# Patient Record
Sex: Male | Born: 1950
Health system: Southern US, Community
[De-identification: ages and names within clinical notes are randomized; demographics above are authoritative.]

## PROBLEM LIST (undated history)

## (undated) DIAGNOSIS — D2360 Other benign neoplasm of skin of unspecified upper limb, including shoulder: Secondary | ICD-10-CM

## (undated) DIAGNOSIS — D126 Benign neoplasm of colon, unspecified: Secondary | ICD-10-CM

## (undated) DIAGNOSIS — R5383 Other fatigue: Secondary | ICD-10-CM

## (undated) DIAGNOSIS — IMO0002 Reserved for concepts with insufficient information to code with codable children: Secondary | ICD-10-CM

## (undated) DIAGNOSIS — C44309 Unspecified malignant neoplasm of skin of other parts of face: Secondary | ICD-10-CM

## (undated) DIAGNOSIS — Z8719 Personal history of other diseases of the digestive system: Secondary | ICD-10-CM

## (undated) DIAGNOSIS — M4802 Spinal stenosis, cervical region: Secondary | ICD-10-CM

## (undated) DIAGNOSIS — N529 Male erectile dysfunction, unspecified: Secondary | ICD-10-CM

## (undated) DIAGNOSIS — Z125 Encounter for screening for malignant neoplasm of prostate: Secondary | ICD-10-CM

## (undated) DIAGNOSIS — B029 Zoster without complications: Secondary | ICD-10-CM

## (undated) DIAGNOSIS — E785 Hyperlipidemia, unspecified: Secondary | ICD-10-CM

## (undated) DIAGNOSIS — L821 Other seborrheic keratosis: Secondary | ICD-10-CM

## (undated) DIAGNOSIS — R079 Chest pain, unspecified: Secondary | ICD-10-CM

## (undated) DIAGNOSIS — M1710 Unilateral primary osteoarthritis, unspecified knee: Secondary | ICD-10-CM

## (undated) DIAGNOSIS — H269 Unspecified cataract: Secondary | ICD-10-CM

## (undated) DIAGNOSIS — M545 Low back pain, unspecified: Secondary | ICD-10-CM

## (undated) DIAGNOSIS — I10 Essential (primary) hypertension: Secondary | ICD-10-CM

## (undated) DIAGNOSIS — K219 Gastro-esophageal reflux disease without esophagitis: Secondary | ICD-10-CM

## (undated) DIAGNOSIS — N401 Enlarged prostate with lower urinary tract symptoms: Secondary | ICD-10-CM

## (undated) DIAGNOSIS — H9319 Tinnitus, unspecified ear: Secondary | ICD-10-CM

## (undated) DIAGNOSIS — G5600 Carpal tunnel syndrome, unspecified upper limb: Secondary | ICD-10-CM

## (undated) DIAGNOSIS — I5032 Chronic diastolic (congestive) heart failure: Secondary | ICD-10-CM

## (undated) DIAGNOSIS — K449 Diaphragmatic hernia without obstruction or gangrene: Secondary | ICD-10-CM

## (undated) DIAGNOSIS — N138 Other obstructive and reflux uropathy: Secondary | ICD-10-CM

## (undated) DIAGNOSIS — E669 Obesity, unspecified: Secondary | ICD-10-CM

## (undated) DIAGNOSIS — H409 Unspecified glaucoma: Secondary | ICD-10-CM

## (undated) DIAGNOSIS — Z85828 Personal history of other malignant neoplasm of skin: Secondary | ICD-10-CM

## (undated) DIAGNOSIS — R972 Elevated prostate specific antigen [PSA]: Secondary | ICD-10-CM

## (undated) DIAGNOSIS — R5381 Other malaise: Secondary | ICD-10-CM

## (undated) HISTORY — DX: Benign neoplasm of colon, unspecified: D12.6

## (undated) HISTORY — PX: EYE SURGERY: SHX253

## (undated) HISTORY — DX: Reserved for concepts with insufficient information to code with codable children: IMO0002

## (undated) HISTORY — PX: JOINT REPLACEMENT: SHX530

## (undated) HISTORY — DX: Chronic diastolic (congestive) heart failure: I50.32

## (undated) HISTORY — PX: TOTAL KNEE ARTHROPLASTY: SHX125

## (undated) HISTORY — DX: Benign prostatic hyperplasia with lower urinary tract symptoms: N40.1

## (undated) HISTORY — DX: Other seborrheic keratosis: L82.1

## (undated) HISTORY — DX: Essential (primary) hypertension: I10

## (undated) HISTORY — DX: Zoster without complications: B02.9

## (undated) HISTORY — DX: Unilateral primary osteoarthritis, unspecified knee: M17.10

## (undated) HISTORY — DX: Other obstructive and reflux uropathy: N13.8

## (undated) HISTORY — DX: Encounter for screening for malignant neoplasm of prostate: Z12.5

## (undated) HISTORY — DX: Unspecified cataract: H26.9

## (undated) HISTORY — DX: Male erectile dysfunction, unspecified: N52.9

## (undated) HISTORY — DX: Hyperlipidemia, unspecified: E78.5

## (undated) HISTORY — DX: Other fatigue: R53.83

## (undated) HISTORY — DX: Unspecified malignant neoplasm of skin of other parts of face: C44.309

## (undated) HISTORY — DX: Low back pain, unspecified: M54.50

## (undated) HISTORY — PX: CHOLECYSTECTOMY: SHX55

## (undated) HISTORY — DX: Obesity, unspecified: E66.9

## (undated) HISTORY — DX: Unspecified glaucoma: H40.9

## (undated) HISTORY — DX: Carpal tunnel syndrome, unspecified upper limb: G56.00

## (undated) HISTORY — DX: Elevated prostate specific antigen (PSA): R97.20

## (undated) HISTORY — DX: Other benign neoplasm of skin of unspecified upper limb, including shoulder: D23.60

## (undated) HISTORY — DX: Chest pain, unspecified: R07.9

## (undated) HISTORY — DX: Personal history of other malignant neoplasm of skin: Z85.828

## (undated) HISTORY — DX: Low back pain: M54.5

## (undated) HISTORY — DX: Tinnitus, unspecified ear: H93.19

## (undated) HISTORY — DX: Diaphragmatic hernia without obstruction or gangrene: K44.9

## (undated) HISTORY — DX: Gastro-esophageal reflux disease without esophagitis: K21.9

## (undated) HISTORY — DX: Other malaise: R53.81

---

## 1982-08-22 HISTORY — PX: INGUINAL HERNIA REPAIR: SUR1180

## 1990-08-22 HISTORY — PX: CHOLECYSTECTOMY: SHX55

## 1999-08-23 HISTORY — PX: RETINAL DETACHMENT SURGERY: SHX105

## 2002-08-22 DIAGNOSIS — J189 Pneumonia, unspecified organism: Secondary | ICD-10-CM

## 2002-08-22 HISTORY — DX: Pneumonia, unspecified organism: J18.9

## 2004-08-22 HISTORY — PX: OTHER SURGICAL HISTORY: SHX169

## 2006-06-02 ENCOUNTER — Ambulatory Visit (HOSPITAL_COMMUNITY): Admission: RE | Admit: 2006-06-02 | Discharge: 2006-06-02 | Payer: Self-pay | Admitting: Specialist

## 2006-08-22 HISTORY — PX: INTRAOCULAR LENS REMOVAL: SHX5339

## 2008-09-04 ENCOUNTER — Emergency Department (HOSPITAL_COMMUNITY): Admission: EM | Admit: 2008-09-04 | Discharge: 2008-09-04 | Payer: Self-pay | Admitting: Family Medicine

## 2008-09-16 ENCOUNTER — Emergency Department (HOSPITAL_COMMUNITY): Admission: EM | Admit: 2008-09-16 | Discharge: 2008-09-16 | Payer: Self-pay | Admitting: Emergency Medicine

## 2008-10-03 ENCOUNTER — Encounter: Admission: RE | Admit: 2008-10-03 | Discharge: 2008-10-03 | Payer: Self-pay | Admitting: Internal Medicine

## 2009-08-05 ENCOUNTER — Encounter: Admission: RE | Admit: 2009-08-05 | Discharge: 2009-08-05 | Payer: Self-pay | Admitting: Internal Medicine

## 2009-10-05 HISTORY — PX: TRANSTHORACIC ECHOCARDIOGRAM: SHX275

## 2009-11-20 ENCOUNTER — Ambulatory Visit (HOSPITAL_COMMUNITY): Admission: RE | Admit: 2009-11-20 | Discharge: 2009-11-20 | Payer: Self-pay | Admitting: General Surgery

## 2010-05-03 ENCOUNTER — Ambulatory Visit (HOSPITAL_BASED_OUTPATIENT_CLINIC_OR_DEPARTMENT_OTHER): Admission: RE | Admit: 2010-05-03 | Discharge: 2010-05-03 | Payer: Self-pay | Admitting: Specialist

## 2010-06-24 ENCOUNTER — Inpatient Hospital Stay (HOSPITAL_COMMUNITY)
Admission: RE | Admit: 2010-06-24 | Discharge: 2010-06-27 | Payer: Self-pay | Source: Home / Self Care | Admitting: Specialist

## 2010-08-22 HISTORY — PX: TOTAL HIP ARTHROPLASTY: SHX124

## 2010-08-25 ENCOUNTER — Other Ambulatory Visit: Payer: Self-pay | Admitting: Specialist

## 2010-08-25 ENCOUNTER — Ambulatory Visit
Admission: RE | Admit: 2010-08-25 | Discharge: 2010-08-25 | Payer: Self-pay | Source: Home / Self Care | Attending: Specialist | Admitting: Specialist

## 2010-08-27 LAB — URINALYSIS, ROUTINE W REFLEX MICROSCOPIC
Bilirubin Urine: NEGATIVE
Hemoglobin, Urine: NEGATIVE
Ketones, ur: NEGATIVE mg/dL
Nitrite: NEGATIVE
Protein, ur: NEGATIVE mg/dL
Specific Gravity, Urine: 1.014 (ref 1.005–1.030)
Urine Glucose, Fasting: NEGATIVE mg/dL
Urobilinogen, UA: 1 mg/dL (ref 0.0–1.0)
pH: 7 (ref 5.0–8.0)

## 2010-08-27 LAB — COMPREHENSIVE METABOLIC PANEL
ALT: <8 U/L (ref 0–53)
AST: 18 U/L (ref 0–37)
Albumin: 4.1 g/dL (ref 3.5–5.2)
Alkaline Phosphatase: 102 U/L (ref 39–117)
BUN: 10 mg/dL (ref 6–23)
CO2: 27 mEq/L (ref 19–32)
Calcium: 9.5 mg/dL (ref 8.4–10.5)
Chloride: 101 mEq/L (ref 96–112)
Creatinine, Ser: 0.84 mg/dL (ref 0.4–1.5)
GFR calc Af Amer: >60 mL/min (ref >60)
GFR calc non Af Amer: >60 mL/min (ref >60)
Glucose, Bld: 99 mg/dL (ref 70–99)
Potassium: 3.7 mEq/L (ref 3.5–5.1)
Sodium: 137 mEq/L (ref 135–145)
Total Bilirubin: 0.7 mg/dL (ref 0.3–1.2)
Total Protein: 6.5 g/dL (ref 6.0–8.3)

## 2010-08-27 LAB — CBC
HCT: 42.6 % (ref 39.0–52.0)
Hemoglobin: 14.4 g/dL (ref 13.0–17.0)
MCH: 29.8 pg (ref 26.0–34.0)
MCHC: 33.8 g/dL (ref 30.0–36.0)
MCV: 88.2 fL (ref 78.0–100.0)
Platelets: 242 10*3/uL (ref 150–400)
RBC: 4.83 MIL/uL (ref 4.22–5.81)
RDW: 12.8 % (ref 11.5–15.5)
WBC: 10.7 10*3/uL — ABNORMAL HIGH (ref 4.0–10.5)

## 2010-08-27 LAB — DIFFERENTIAL
Basophils Absolute: 0.1 10*3/uL (ref 0.0–0.1)
Basophils Relative: 1 % (ref 0–1)
Eosinophils Absolute: 0.4 10*3/uL (ref 0.0–0.7)
Eosinophils Relative: 4 % (ref 0–5)
Lymphocytes Relative: 23 % (ref 12–46)
Lymphs Abs: 2.5 10*3/uL (ref 0.7–4.0)
Monocytes Absolute: 1 10*3/uL (ref 0.1–1.0)
Monocytes Relative: 9 % (ref 3–12)
Neutro Abs: 6.8 10*3/uL (ref 1.7–7.7)
Neutrophils Relative %: 63 % (ref 43–77)

## 2010-08-27 LAB — PROTIME-INR
INR: 0.96 (ref 0.00–1.49)
Prothrombin Time: 13 seconds (ref 11.6–15.2)

## 2010-08-27 LAB — APTT: aPTT: 31 seconds (ref 24–37)

## 2010-09-02 ENCOUNTER — Inpatient Hospital Stay (HOSPITAL_COMMUNITY)
Admission: RE | Admit: 2010-09-02 | Discharge: 2010-09-05 | Payer: Self-pay | Source: Home / Self Care | Attending: Specialist | Admitting: Specialist

## 2010-09-06 LAB — CBC
HCT: 31.7 % — ABNORMAL LOW (ref 39.0–52.0)
HCT: 30 % — ABNORMAL LOW (ref 39.0–52.0)
HCT: 30.7 % — ABNORMAL LOW (ref 39.0–52.0)
Hemoglobin: 10.5 g/dL — ABNORMAL LOW (ref 13.0–17.0)
Hemoglobin: 10.1 g/dL — ABNORMAL LOW (ref 13.0–17.0)
Hemoglobin: 10.3 g/dL — ABNORMAL LOW (ref 13.0–17.0)
MCH: 29.6 pg (ref 26.0–34.0)
MCH: 29.5 pg (ref 26.0–34.0)
MCH: 29.4 pg (ref 26.0–34.0)
MCHC: 33.1 g/dL (ref 30.0–36.0)
MCHC: 33.7 g/dL (ref 30.0–36.0)
MCHC: 33.6 g/dL (ref 30.0–36.0)
MCV: 89.3 fL (ref 78.0–100.0)
MCV: 87.7 fL (ref 78.0–100.0)
MCV: 87.7 fL (ref 78.0–100.0)
Platelets: 190 10*3/uL (ref 150–400)
Platelets: 157 10*3/uL (ref 150–400)
Platelets: 175 10*3/uL (ref 150–400)
RBC: 3.55 MIL/uL — ABNORMAL LOW (ref 4.22–5.81)
RBC: 3.42 MIL/uL — ABNORMAL LOW (ref 4.22–5.81)
RBC: 3.5 MIL/uL — ABNORMAL LOW (ref 4.22–5.81)
RDW: 13 % (ref 11.5–15.5)
RDW: 13 % (ref 11.5–15.5)
RDW: 13.1 % (ref 11.5–15.5)
WBC: 12.3 10*3/uL — ABNORMAL HIGH (ref 4.0–10.5)
WBC: 11.4 10*3/uL — ABNORMAL HIGH (ref 4.0–10.5)
WBC: 10.7 10*3/uL — ABNORMAL HIGH (ref 4.0–10.5)

## 2010-09-06 LAB — BASIC METABOLIC PANEL
BUN: 9 mg/dL (ref 6–23)
BUN: 6 mg/dL (ref 6–23)
BUN: 4 mg/dL — ABNORMAL LOW (ref 6–23)
CO2: 29 mEq/L (ref 19–32)
CO2: 27 mEq/L (ref 19–32)
CO2: 28 mEq/L (ref 19–32)
Calcium: 8.4 mg/dL (ref 8.4–10.5)
Calcium: 8.5 mg/dL (ref 8.4–10.5)
Calcium: 8.7 mg/dL (ref 8.4–10.5)
Chloride: 102 mEq/L (ref 96–112)
Chloride: 105 mEq/L (ref 96–112)
Chloride: 103 mEq/L (ref 96–112)
Creatinine, Ser: 0.81 mg/dL (ref 0.4–1.5)
Creatinine, Ser: 0.74 mg/dL (ref 0.4–1.5)
Creatinine, Ser: 0.73 mg/dL (ref 0.4–1.5)
GFR calc Af Amer: >60 mL/min (ref >60)
GFR calc Af Amer: >60 mL/min (ref >60)
GFR calc Af Amer: >60 mL/min (ref >60)
GFR calc non Af Amer: >60 mL/min (ref >60)
GFR calc non Af Amer: >60 mL/min (ref >60)
GFR calc non Af Amer: >60 mL/min (ref >60)
Glucose, Bld: 116 mg/dL — ABNORMAL HIGH (ref 70–99)
Glucose, Bld: 114 mg/dL — ABNORMAL HIGH (ref 70–99)
Glucose, Bld: 117 mg/dL — ABNORMAL HIGH (ref 70–99)
Potassium: 4.3 mEq/L (ref 3.5–5.1)
Potassium: 3.6 mEq/L (ref 3.5–5.1)
Potassium: 3.5 mEq/L (ref 3.5–5.1)
Sodium: 135 mEq/L (ref 135–145)
Sodium: 136 mEq/L (ref 135–145)
Sodium: 138 mEq/L (ref 135–145)

## 2010-09-06 LAB — TYPE AND SCREEN
ABO/RH(D): A NEG
Antibody Screen: NEGATIVE

## 2010-09-06 LAB — PROTIME-INR
INR: 1.1 (ref 0.00–1.49)
INR: 1.77 — ABNORMAL HIGH (ref 0.00–1.49)
INR: 1.82 — ABNORMAL HIGH (ref 0.00–1.49)
Prothrombin Time: 14.4 seconds (ref 11.6–15.2)
Prothrombin Time: 20.8 seconds — ABNORMAL HIGH (ref 11.6–15.2)
Prothrombin Time: 21.2 seconds — ABNORMAL HIGH (ref 11.6–15.2)

## 2010-09-16 LAB — SURGICAL PCR SCREEN
MRSA, PCR: NEGATIVE
Staphylococcus aureus: NEGATIVE

## 2010-09-27 NOTE — Discharge Summary (Signed)
NAMEGREGERY, WALBERG NO.:  [id number]  MEDICAL RECORD NO.:  [id number]          PATIENT TYPE:  INP  LOCATION:  1602                         FACILITY:  [facility]  PHYSICIAN:  Jene Every, M.D.    DATE OF BIRTH:  [birth year]  DATE OF ADMISSION:  09/02/2010 DATE OF DISCHARGE:  09/05/2010                              DISCHARGE SUMMARY   ADMISSION DIAGNOSES: 1. Degenerative joint disease of the right hip. 2. Hypertension. 3. Gastroesophageal reflux disease. 4. Hypercholesteremia. 5. Early glaucoma.  DISCHARGE DIAGNOSES: 1. Degenerative joint disease of the right hip status post right total-     hip arthroplasty. 2. Hypertension. 3. Gastroesophageal reflux disease. 4. Hypercholesteremia. 5. Early glaucoma.  HISTORY:  Anthony [name] is a well-known patient to our practice.  He has recently undergone total-knee on the left.  He was scheduled initially for a total-knee on the right but during physical therapy for the opposite knee noted the onset of groin pain.  X-rays revealed end-stage degenerative changes of the hip.  It was felt at this time that he would benefit from a total-hip arthroplasty.  The risks and benefits of the surgery were discussed with the patient.  He does elect to proceed.  CONSULTATIONS:  PT, OT, Care Management.  PROCEDURE:  The patient was taken to the OR and underwent right total- hip arthroplasty.  SURGEON:  Jene Every, M.D.  ASSISTANT: 1. Roma Schanz, P.A.C. 2. Madlyn Frankel. Charlann Boxer, M.D.  ANESTHESIA:  General.  COMPLICATIONS:  None.  LABORATORY DATA:  Preoperative CBC showed white cell count 10.17, hemoglobin 14.4, hematocrit 42.6.  This was monitored at the hospital stay.  White cell count at the time of discharge 10.7, hemoglobin 10.3, hematocrit 30.7.  Routine coagulation studies done preoperatively showed PT of 13, INR 0.96, PTT of 31.  This was monitored throughout the hospital course.  The patient was placed on  Coumadin with Lovenox to bridge.  At the time of discharge the INR is 1.82.  Routine chemistries done preoperatively showed sodium 137, potassium 3.0 with a normal BUN and creatinine.  This was followed throughout the hospital course as well.  All labs remained within normal range.  GFR is greater than 60. Routine liver function tests within normal range.  Preoperative urinalysis was negative.  Blood type is A negative.  MRSA and Staphylococcus screen was negative.  RADIOLOGIC:  Preoperative chest x-ray showed no acute cardiopulmonary disease.  HOSPITAL COURSE:  The patient was admitted, taken to the OR and underwent the above-stated procedure without difficulties.  He was then transferred to the PACU and then to the Orthopedic Floor for continued postoperative care.  He was placed on PCA analgesics for pain relief. Coumadin was started per Pharmacy with Lovenox to bridge.  On postoperative day #1 the patient was doing fairly well.  He did note thigh pain as expected.  Denied any chest pain or shortness of breath. Vital signs were stable.  He was afebrile.  Hemoglobin was 10.5. Hematocrit 31.7.  Motor and neurovascular function was intact.  PT, OT was consulted.  The patient was weaned from PCA.  Discharge planning was  initiated.  Over the course of the next few days the patient did do well with therapy.  Pain was fairly well controlled on p.o. analgesics.  He was bridged with Lovenox secondary to slow response with the Coumadin. Postoperative day #3 it was felt that the patient was stable to be discharged home.  He had met all physical therapy goals.  DISPOSITION:  The patient was discharged home with home health needs met.  FOLLOWUP:  He is to follow up with Dr. Shelle Iron in approximately 10 to 14 days for suture removal and x-ray.  ACTIVITIES:  He is to be partial weightbearing to the right lower extremity, elevation above heart level several times a day.  DISCHARGE MEDICATIONS:   As per medication reconciliation sheet.  DIET:  As tolerated.  CONDITION ON DISCHARGE:  Stable.  FINAL DIAGNOSIS:  Status post right total-hip arthroplasty.     Roma Schanz, P.A.   ______________________________ Jene Every, M.D.    CS/MEDQ  D:  09/20/2010  T:  09/20/2010  Job:  [id number]  Electronically Signed by Roma Schanz P.A. on 09/22/2010 04:48:16 PM Electronically Signed by Jene Every M.D. on 09/27/2010 02:14:51 PM

## 2010-11-02 LAB — CBC
HCT: 33.6 % — ABNORMAL LOW (ref 39.0–52.0)
HCT: 34.9 % — ABNORMAL LOW (ref 39.0–52.0)
Hemoglobin: 11.7 g/dL — ABNORMAL LOW (ref 13.0–17.0)
Hemoglobin: 12.2 g/dL — ABNORMAL LOW (ref 13.0–17.0)
Hemoglobin: 13 g/dL (ref 13.0–17.0)
MCH: 31.1 pg (ref 26.0–34.0)
MCHC: 34.9 g/dL (ref 30.0–36.0)
MCV: 88.9 fL (ref 78.0–100.0)
MCV: 89.8 fL (ref 78.0–100.0)
RBC: 3.78 MIL/uL — ABNORMAL LOW (ref 4.22–5.81)
RBC: 4.17 MIL/uL — ABNORMAL LOW (ref 4.22–5.81)
RDW: 12.8 % (ref 11.5–15.5)
WBC: 11.7 10*3/uL — ABNORMAL HIGH (ref 4.0–10.5)

## 2010-11-02 LAB — BASIC METABOLIC PANEL
BUN: 8 mg/dL (ref 6–23)
CO2: 29 mEq/L (ref 19–32)
CO2: 29 mEq/L (ref 19–32)
Chloride: 100 mEq/L (ref 96–112)
Chloride: 98 mEq/L (ref 96–112)
GFR calc Af Amer: >60 mL/min (ref >60)
GFR calc non Af Amer: >60 mL/min (ref >60)
Glucose, Bld: 121 mg/dL — ABNORMAL HIGH (ref 70–99)
Potassium: 2.9 mEq/L — ABNORMAL LOW (ref 3.5–5.1)
Potassium: 3.6 mEq/L (ref 3.5–5.1)
Potassium: 3.6 mEq/L (ref 3.5–5.1)
Sodium: 134 mEq/L — ABNORMAL LOW (ref 135–145)
Sodium: 135 mEq/L (ref 135–145)

## 2010-11-02 LAB — PROTIME-INR
INR: 1.66 — ABNORMAL HIGH (ref 0.00–1.49)
Prothrombin Time: 14.1 seconds (ref 11.6–15.2)

## 2010-11-02 LAB — TYPE AND SCREEN
ABO/RH(D): A NEG
Antibody Screen: NEGATIVE

## 2010-11-02 LAB — ABO/RH: ABO/RH(D): A NEG

## 2010-11-03 LAB — DIFFERENTIAL
Basophils Absolute: 0.1 10*3/uL (ref 0.0–0.1)
Basophils Relative: 1 % (ref 0–1)
Eosinophils Absolute: 0.4 10*3/uL (ref 0.0–0.7)
Monocytes Absolute: 0.7 10*3/uL (ref 0.1–1.0)
Neutro Abs: 5.5 10*3/uL (ref 1.7–7.7)
Neutrophils Relative %: 65 % (ref 43–77)

## 2010-11-03 LAB — COMPREHENSIVE METABOLIC PANEL
ALT: 25 U/L (ref 0–53)
Alkaline Phosphatase: 92 U/L (ref 39–117)
BUN: 10 mg/dL (ref 6–23)
Chloride: 104 mEq/L (ref 96–112)
Glucose, Bld: 101 mg/dL — ABNORMAL HIGH (ref 70–99)
Potassium: 3.9 mEq/L (ref 3.5–5.1)
Total Bilirubin: 0.8 mg/dL (ref 0.3–1.2)

## 2010-11-03 LAB — APTT: aPTT: 31 seconds (ref 24–37)

## 2010-11-03 LAB — CBC
HCT: 44.5 % (ref 39.0–52.0)
Hemoglobin: 15.4 g/dL (ref 13.0–17.0)
MCV: 89.5 fL (ref 78.0–100.0)
RBC: 4.97 MIL/uL (ref 4.22–5.81)
WBC: 8.6 10*3/uL (ref 4.0–10.5)

## 2010-11-03 LAB — URINALYSIS, ROUTINE W REFLEX MICROSCOPIC
Bilirubin Urine: NEGATIVE
Hgb urine dipstick: NEGATIVE
Ketones, ur: NEGATIVE mg/dL
Nitrite: NEGATIVE
pH: 7 (ref 5.0–8.0)

## 2010-11-03 LAB — PROTIME-INR: INR: 0.93 (ref 0.00–1.49)

## 2010-11-03 LAB — SURGICAL PCR SCREEN: MRSA, PCR: NEGATIVE

## 2010-11-04 LAB — POCT I-STAT 4, (NA,K, GLUC, HGB,HCT)
Glucose, Bld: 97 mg/dL (ref 70–99)
HCT: 42 % (ref 39.0–52.0)
Hemoglobin: 14.3 g/dL (ref 13.0–17.0)
Sodium: 141 mEq/L (ref 135–145)

## 2010-11-14 LAB — COMPREHENSIVE METABOLIC PANEL
ALT: 28 U/L (ref 0–53)
AST: 23 U/L (ref 0–37)
Albumin: 4.1 g/dL (ref 3.5–5.2)
Alkaline Phosphatase: 74 U/L (ref 39–117)
Calcium: 9.3 mg/dL (ref 8.4–10.5)
GFR calc Af Amer: >60 mL/min (ref >60)
Glucose, Bld: 98 mg/dL (ref 70–99)
Potassium: 3.9 mEq/L (ref 3.5–5.1)
Sodium: 142 mEq/L (ref 135–145)
Total Protein: 5.9 g/dL — ABNORMAL LOW (ref 6.0–8.3)

## 2010-11-14 LAB — CBC
Hemoglobin: 14.5 g/dL (ref 13.0–17.0)
MCHC: 33.5 g/dL (ref 30.0–36.0)
Platelets: 224 10*3/uL (ref 150–400)
RDW: 12.9 % (ref 11.5–15.5)

## 2010-11-14 LAB — DIFFERENTIAL
Basophils Relative: 1 % (ref 0–1)
Eosinophils Absolute: 0.2 10*3/uL (ref 0.0–0.7)
Eosinophils Relative: 3 % (ref 0–5)
Lymphs Abs: 1.7 10*3/uL (ref 0.7–4.0)
Monocytes Absolute: 0.6 10*3/uL (ref 0.1–1.0)
Monocytes Relative: 8 % (ref 3–12)
Neutrophils Relative %: 64 % (ref 43–77)

## 2010-11-25 NOTE — Op Note (Signed)
  NAME:  Anthony [name], Anthony [name] NO.:  [id number]  MEDICAL RECORD NO.:  [id number]          PATIENT TYPE:  OUT  LOCATION:  DFTL                         FACILITY:  [facility]  PHYSICIAN:  Jene Every, M.D.    DATE OF BIRTH:  September 16, 1950  DATE OF PROCEDURE:  09/02/2010 DATE OF DISCHARGE:  08/25/2010                              OPERATIVE REPORT   ADDENDUM  TECHNIQUE:  When we prepared the acetabulum there was some small acetabular cysts measuring approximately a centimeter by half a centimeter.  These were curetted approximately a half centimeter in depth.  We then used cancellus bone from the femoral head and neck and impacted it into the cysts and with the reamer on reverse we slightly reamed the acetabulum with that.  This was to treat the acetabular cysts.     Jene Every, M.D.     Cordelia Pen  D:  09/02/2010  T:  09/02/2010  Job:  [id number]  Electronically Signed by Jene Every M.D. on 11/25/2010 12:53:35 PM

## 2010-11-25 NOTE — Op Note (Signed)
NAME:  Anthony [name], PLANTE NO.:  [id number]  MEDICAL RECORD NO.:  [id number]          PATIENT TYPE:  OUT  LOCATION:  DFTL                         FACILITY:  [facility]  PHYSICIAN:  Jene Every, M.D.    DATE OF BIRTH:  04-01-1951  DATE OF PROCEDURE: DATE OF DISCHARGE:  08/25/2010                              OPERATIVE REPORT   PREOPERATIVE DIAGNOSIS:  Degenerative joint disease right hip.  POSTOPERATIVE DIAGNOSIS:  Degenerative joint disease right hip.  PROCEDURE PERFORMED:  Right total-hip arthroplasty.  COMPONENTS UTILIZED:  DePuy prodigy 16.5 femoral stem, 52 mm acetabulum, 10 degrees Pinnacle acetabular liner with a 5.0 ceramic femoral head.  ANESTHESIA:  General.  ASSISTANT: 1. Roma Schanz, P.A. 2. Madlyn Frankel. Charlann Boxer, M.D.  BRIEF HISTORY:  This is a 60 year old who has had a history of a knee replacement who recently had a collapse in his femoral head with severe hip pain necessitating a total-hip.  He had been refractory to conservative treatment, corticosteroid injections and severe pain and was indicated for a total-hip replacement.  The risks and benefits were discussed including bleeding, infection, damage to neurovascular structures, leg-length discrepancy, dislocation, DVT, PE, anesthetic complications, et Karie Soda.  TECHNIQUE:  The patient was placed in the supine position after induction of adequate anesthesia, 2 grams Kefzol and 600 clindamycin. He was placed in the left lateral decubitus position.  All bony prominences were well-padded, hip holder utilized.  Leg lengths estimated.  The right hip peritrochanteric region was prepped and draped in the usual sterile fashion.  The incision was made for posterolateral approach based upon the trochanter.  Subcutaneous tissue was dissected. Electrocautery was utilized to achieve hemostasis.  The fascia lata was identified and divided in line with the skin incision.  Turnley retractor was then placed.   Abductor tenotomy performed.  The patient had severe restriction in his range of motion of the hip.  External rotators were identified, tagged and reflected posteriorly.  The capsule was identified.  Retractor was utilized and a T-shaped capsulotomy performed.  Copious portions of synovial fluid evacuated.  Capsule excised.  Hypertrophic synovium was noted and excised.  There was a considerable difficulty dislocating the hip due to the ankylosis and contracture.  Therefore, I performed a provisional cut beneath the femoral head with an oscillating saw.  Following this, we dislocated the hip and removed the head utilizing a Mueller retractor.  At a point one fingerbreadth above the lesser trochanter, we performed the osteotomy of the femoral neck utilizing the oscillating saw.  Following this, we used a step reamer to identify the femoral canal.  This was after a broach. After identifying the femoral canal, we sequentially reamed to a 16 mm diameter with good cortical purchase.  Sequentially broached to a 16.5 small.  Then we turned our attention towards the acetabulum.  Retractors were well-placed.  We excised the remnant of the labrum.  Osteophytes were removed with an osteotome.  We curetted the acetabulum and the base of the acetabulum identifying the fovea.  With the reamer and the 45 degrees and 15 degrees of anteversion, we sequentially reamed the acetabulum to  a 51 mm diameter with good cortical purchase and good bleeding bone.  We then placed a trial acetabular component, trial femur and reduced it with a +5 femoral head.  There was slight excursion in flexion and in extension and we therefore felt that selecting the 16.5 wide and seated proud would be most appropriate.  This broach was then a small broach, removed the large broach in place and we re-trialed with that construct with a satisfactory leg length and stability.  Following that, we removed the femoral broach as well as  the trial acetabulum. The permanent acetabulum was then impacted into place, a 52 porous coated Pinnacle cup.  This was impacted in the appropriate aversion utilizing the external alignment guide with excellent fixation seated to the floor of the acetabulum.  The cup moved with the acetabulum with extension and flexion.  I placed a central cover.  No screws were felt necessary.  Then a 10 degrees liner was then placed, polyethylene liner, with the apex at the posterior portion of the acetabulum.  We then turned our attention back towards the femur with the femur and the appropriate version.  We inserted a 16.5 large porous coated prodigy, impacted it sequentially without difficulty.  This seated approximately 5 mm proud.  We again then trialed the +1 and then the +5 femoral head. The +5 gave [place] the satisfactory equivalent leg length as well as satisfactory stability in flexion, extension and in external rotation, stable and against anterior dislocation as well.  The wound was copiously irrigated.  The combined anteversion was deemed to be appropriate.  We then removed the trial head and placed a permanent ceramic Biolox Delta +5 femoral head, impacted into place after cleaning the trunnion and again reduced it without difficulty, stable throughout a full range of motion, flexion, internal rotation, extension.  The wound was copiously irrigated.  The femoral head was found to be collinear with the tip of the trochanter and the leg lengths were equivalent.  Next we used bipolar electrocautery utilized to achieve hemostasis.  There was no evidence of active bleeding.  Therefore placed the hip in slight abduction, repaired the fascia lata with #1 Vicryl interrupted figure-of-eight sutures, the subcutaneous with 0 and 2-0 Vicryl simple sutures.  The skin was reapproximated with staples. Throughout the case we palpated the sciatic nerve, used the external rotators and the piriformis to detach  and reflect it posteriorly with an Ethibond suture to protect it throughout the case.  It was palpated at the conclusion of the case and found to be intact.  Next, the patient was placed supine on the hospital bed, leg lengths were equivalent, pulses intact, extubated without difficulty and transported to the recovery room in satisfactory condition.  The patient tolerated the procedure well.  There were no complications.  ESTIMATED BLOOD LOSS:  700 cc.     Jene Every, M.D.     Cordelia Pen  D:  09/02/2010  T:  09/02/2010  Job:  [id number]  Electronically Signed by Jene Every M.D. on 11/25/2010 12:53:33 PM

## 2010-12-06 LAB — DIFFERENTIAL
Basophils Absolute: 0 10*3/uL (ref 0.0–0.1)
Basophils Relative: 1 % (ref 0–1)
Eosinophils Absolute: 0.3 10*3/uL (ref 0.0–0.7)
Monocytes Absolute: 1 10*3/uL (ref 0.1–1.0)
Monocytes Relative: 14 % — ABNORMAL HIGH (ref 3–12)
Neutro Abs: 3.8 10*3/uL (ref 1.7–7.7)
Neutrophils Relative %: 54 % (ref 43–77)

## 2010-12-06 LAB — CBC
MCHC: 34 g/dL (ref 30.0–36.0)
MCV: 90 fL (ref 78.0–100.0)
RBC: 4.6 MIL/uL (ref 4.22–5.81)
RDW: 12.9 % (ref 11.5–15.5)

## 2010-12-09 ENCOUNTER — Ambulatory Visit (HOSPITAL_COMMUNITY)
Admission: RE | Admit: 2010-12-09 | Discharge: 2010-12-09 | Disposition: A | Discharge status: Home / Self Care | Payer: Managed Care, Other (non HMO) | Source: Ambulatory Visit | Attending: Specialist | Admitting: Specialist

## 2010-12-09 ENCOUNTER — Other Ambulatory Visit: Payer: Self-pay | Admitting: Specialist

## 2010-12-09 ENCOUNTER — Encounter (HOSPITAL_COMMUNITY): Payer: Managed Care, Other (non HMO)

## 2010-12-09 ENCOUNTER — Other Ambulatory Visit (HOSPITAL_COMMUNITY): Payer: Self-pay | Admitting: Specialist

## 2010-12-09 DIAGNOSIS — Z01818 Encounter for other preprocedural examination: Secondary | ICD-10-CM | POA: Insufficient documentation

## 2010-12-09 DIAGNOSIS — Z01812 Encounter for preprocedural laboratory examination: Secondary | ICD-10-CM | POA: Insufficient documentation

## 2010-12-09 LAB — COMPREHENSIVE METABOLIC PANEL
Albumin: 4.2 g/dL (ref 3.5–5.2)
Alkaline Phosphatase: 114 U/L (ref 39–117)
BUN: 11 mg/dL (ref 6–23)
CO2: 31 mEq/L (ref 19–32)
Chloride: 99 mEq/L (ref 96–112)
Creatinine, Ser: 0.83 mg/dL (ref 0.4–1.5)
GFR calc non Af Amer: >60 mL/min (ref >60)
Potassium: 4.2 mEq/L (ref 3.5–5.1)
Total Bilirubin: 0.5 mg/dL (ref 0.3–1.2)

## 2010-12-09 LAB — CBC
MCH: 27.7 pg (ref 26.0–34.0)
MCHC: 33.3 g/dL (ref 30.0–36.0)
Platelets: 266 10*3/uL (ref 150–400)
RDW: 13.2 % (ref 11.5–15.5)

## 2010-12-09 LAB — URINALYSIS, ROUTINE W REFLEX MICROSCOPIC
Bilirubin Urine: NEGATIVE
Hgb urine dipstick: NEGATIVE
Nitrite: NEGATIVE
Protein, ur: NEGATIVE mg/dL
Urobilinogen, UA: 0.2 mg/dL (ref 0.0–1.0)

## 2010-12-09 LAB — DIFFERENTIAL
Basophils Absolute: 0.1 10*3/uL (ref 0.0–0.1)
Basophils Relative: 1 % (ref 0–1)
Eosinophils Absolute: 0.3 10*3/uL (ref 0.0–0.7)
Monocytes Absolute: 1.3 10*3/uL — ABNORMAL HIGH (ref 0.1–1.0)
Monocytes Relative: 10 % (ref 3–12)

## 2010-12-09 LAB — PROTIME-INR: Prothrombin Time: 12.4 seconds (ref 11.6–15.2)

## 2010-12-09 LAB — SURGICAL PCR SCREEN: MRSA, PCR: NEGATIVE

## 2010-12-16 ENCOUNTER — Inpatient Hospital Stay (HOSPITAL_COMMUNITY): Payer: Managed Care, Other (non HMO)

## 2010-12-16 ENCOUNTER — Inpatient Hospital Stay (HOSPITAL_COMMUNITY)
Admission: RE | Admit: 2010-12-16 | Discharge: 2010-12-19 | DRG: 470 | Disposition: A | Discharge status: Home Health | Payer: Managed Care, Other (non HMO) | Source: Ambulatory Visit | Attending: Specialist | Admitting: Specialist

## 2010-12-16 DIAGNOSIS — Z96659 Presence of unspecified artificial knee joint: Secondary | ICD-10-CM

## 2010-12-16 DIAGNOSIS — H409 Unspecified glaucoma: Secondary | ICD-10-CM | POA: Diagnosis present

## 2010-12-16 DIAGNOSIS — E7800 Pure hypercholesterolemia, unspecified: Secondary | ICD-10-CM | POA: Diagnosis present

## 2010-12-16 DIAGNOSIS — Z96649 Presence of unspecified artificial hip joint: Secondary | ICD-10-CM

## 2010-12-16 DIAGNOSIS — M1710 Unilateral primary osteoarthritis, unspecified knee: Principal | ICD-10-CM | POA: Diagnosis present

## 2010-12-16 DIAGNOSIS — I10 Essential (primary) hypertension: Secondary | ICD-10-CM | POA: Diagnosis present

## 2010-12-16 DIAGNOSIS — Y831 Surgical operation with implant of artificial internal device as the cause of abnormal reaction of the patient, or of later complication, without mention of misadventure at the time of the procedure: Secondary | ICD-10-CM | POA: Diagnosis present

## 2010-12-16 DIAGNOSIS — K219 Gastro-esophageal reflux disease without esophagitis: Secondary | ICD-10-CM | POA: Diagnosis present

## 2010-12-16 DIAGNOSIS — T84099A Other mechanical complication of unspecified internal joint prosthesis, initial encounter: Secondary | ICD-10-CM | POA: Diagnosis present

## 2010-12-16 LAB — TYPE AND SCREEN: ABO/RH(D): A NEG

## 2010-12-17 LAB — CBC
HCT: 36.9 % — ABNORMAL LOW (ref 39.0–52.0)
MCH: 27.6 pg (ref 26.0–34.0)
MCV: 82.9 fL (ref 78.0–100.0)
Platelets: 234 10*3/uL (ref 150–400)
RBC: 4.45 MIL/uL (ref 4.22–5.81)
RDW: 13.4 % (ref 11.5–15.5)
WBC: 17.5 10*3/uL — ABNORMAL HIGH (ref 4.0–10.5)

## 2010-12-17 LAB — BASIC METABOLIC PANEL
CO2: 28 mEq/L (ref 19–32)
Calcium: 8.6 mg/dL (ref 8.4–10.5)
Creatinine, Ser: 0.81 mg/dL (ref 0.4–1.5)
GFR calc Af Amer: >60 mL/min (ref >60)
GFR calc non Af Amer: >60 mL/min (ref >60)
Sodium: 137 mEq/L (ref 135–145)

## 2010-12-18 LAB — CBC
Hemoglobin: 11.6 g/dL — ABNORMAL LOW (ref 13.0–17.0)
MCH: 27.3 pg (ref 26.0–34.0)
MCHC: 33 g/dL (ref 30.0–36.0)
Platelets: 197 10*3/uL (ref 150–400)
RBC: 4.25 MIL/uL (ref 4.22–5.81)

## 2010-12-18 LAB — PROTIME-INR
INR: 1.72 — ABNORMAL HIGH (ref 0.00–1.49)
Prothrombin Time: 20.3 seconds — ABNORMAL HIGH (ref 11.6–15.2)

## 2010-12-18 LAB — BASIC METABOLIC PANEL
CO2: 31 mEq/L (ref 19–32)
Calcium: 8.3 mg/dL — ABNORMAL LOW (ref 8.4–10.5)
Chloride: 97 mEq/L (ref 96–112)
Creatinine, Ser: 0.7 mg/dL (ref 0.4–1.5)
GFR calc Af Amer: >60 mL/min (ref >60)
Sodium: 134 mEq/L — ABNORMAL LOW (ref 135–145)

## 2010-12-19 LAB — CBC
MCHC: 33.2 g/dL (ref 30.0–36.0)
RDW: 13.4 % (ref 11.5–15.5)
WBC: 9.2 10*3/uL (ref 4.0–10.5)

## 2010-12-19 LAB — BASIC METABOLIC PANEL
Calcium: 8.6 mg/dL (ref 8.4–10.5)
GFR calc Af Amer: >60 mL/min (ref >60)
GFR calc non Af Amer: >60 mL/min (ref >60)
Potassium: 3.2 mEq/L — ABNORMAL LOW (ref 3.5–5.1)
Sodium: 135 mEq/L (ref 135–145)

## 2010-12-19 LAB — PROTIME-INR
INR: 2.23 — ABNORMAL HIGH (ref 0.00–1.49)
Prothrombin Time: 24.8 seconds — ABNORMAL HIGH (ref 11.6–15.2)

## 2010-12-27 NOTE — H&P (Signed)
NAME:  Anthony [name], Anthony [name] NO.:  [id number]  MEDICAL RECORD NO.:  [id number]          PATIENT TYPE:  LOCATION:                                 FACILITY:  PHYSICIAN:  Jene Every, M.D.    DATE OF BIRTH:  [birth year]  DATE OF ADMISSION:  12/16/2010 DATE OF DISCHARGE:                             HISTORY & PHYSICAL   CHIEF COMPLAINT:  Right knee pain.  SECONDARY COMPLAINT:  Contracture of the left knee.  HISTORY:  Mr. Coates is a well-known patient to our practice.  He has a longstanding history of bilateral knee arthritis, actually undergone left total knee arthroplasty less than a year ago.  He does continue to have some difficulty with range of motion, likely developed a slight flexion contracture.  He has also had persistent disabling right knee pain.  Films do show end-stage osteoarthritis of the knee.  At this point, Dr. Shelle Iron recommends manipulation of the left knee under anesthesia as well as proceeding with a right total knee arthroplasty. The risks and benefits of the surgery were discussed with the patient and he does elect to proceed.  MEDICAL HISTORY:  Significant for, 1. Hypertension. 2. GERD. 3. Hypercholesteremia. 4. Early glaucoma.  CURRENT MEDICATIONS: 1. Nexium 40 mg 1 p.o. daily. 2. HCTZ 25 mg 1 p.o. daily. 3. Amlodipine 10 mg 1 p.o. daily. 4. Zetia 10 mg 1 p.o. daily. 5. Benazepril HCl 40 mg 1 p.o. daily. 6. Sertraline 50 mg 1 p.o. daily. 7. Aspirin 81 mg daily. 8. Xalatan eye drops nightly. 9. Cosopt 1 drop b.i.d. 10.Robaxin 500 mg 1 p.o. b.i.d. 11.Norco 10/325 1 p.o. q.6-8 p.r.n. pain.  ALLERGIES: 1. CODEINE. 2. LATEX.  PREVIOUS SURGERIES:  Hernia repair, bilateral knee arthroscopies, heart surgery, cholecystectomy, left total knee arthroplasty, right total hip arthroplasty.  SOCIAL HISTORY:  The patient is married.  No history of tobacco.  The patient does drink occasional alcohol.  Family  will be caregivers following  surgery.  PRIMARY CARE PHYSICIAN:  Dr. Chilton Si.  CARDIOLOGIST:  Nicki Guadalajara, MD, the patient recently had a stress test which was negative.  FAMILY HISTORY:  Father deceased at 55 from PE.  Mother at 68.  She had a history of diabetes as well as heart arrhythmia.  The patient has 2 sisters, one deceased of lung cancer at age 82, other has a history of hypertension.  Brother is living with history of coronary artery disease as well as diabetes and history of stroke.  REVIEW OF SYSTEMS:  GENERAL:  The patient denies any fever, chills, night sweats, or bleeding tendencies.  CNS:  No blurred or double vision, seizure, headache, or paralysis.  RESPIRATORY:  No shortness of breath, productive cough, or hemoptysis.  CARDIOVASCULAR:  No chest pain, history orthopnea.  GU:  No dysuria, hematuria, or discharge.  GI: No nausea, vomiting, diarrhea, constipation, melena.  MUSCULOSKELETAL: Pertinent to HPI.  PHYSICAL EXAMINATION:  VITAL SIGNS:  Height is 5 feet 10 inches, weight 240, pulse is 80, respiratory rate 12, BP 150/80. GENERAL:  This is a healthy, slightly overweight gentleman, sitting upright, in mild distress.  He does  walk with slight antalgic gait. HEENT:  Atraumatic, normocephalic.  Pupils equal round and reactive to light.  EOM is intact. NECK:  Supple.  No lymphadenopathy. CHEST:  Clear to auscultation bilaterally.  No rhonchi, wheezes, or rales. HEART:  Regular rate and rhythm without murmurs, gallops, or rubs. ABDOMEN:  Soft, nontender, nondistended.  Bowel sounds x4.  Abdomen is protuberant. SKIN:  No rashes or lesions are noted. EXTREMITIES.  In regard to the left knee, incision is well healed. Range of motion is -5/5 approximately 110 degrees.  Compartments are soft.  In regard to the right knee, there is mild edema.  He does have crepitus on exam with tenderness along the medial joint line.  IMPRESSION: 1. Degenerative joint disease, right knee. 2. Flexion contracture,  status post left total knee arthroplasty.  PLAN:  At this point, the patient will be admitted to [facility] to undergo a right total knee arthroplasty.  We will also perform  manipulation under anesthesia to the left knee.     Roma Schanz, P.A.   ______________________________ Jene Every, M.D.    CS/MEDQ  D:  12/07/2010  T:  12/08/2010  Job:  [id number]  Electronically Signed by Roma Schanz P.A. on 12/14/2010 05:52:53 PM Electronically Signed by Jene Every M.D. on 12/27/2010 02:24:17 PM

## 2010-12-27 NOTE — Op Note (Signed)
NAME:  Anthony [name], CLOWERS NO.:  [id number]  MEDICAL RECORD NO.:  [id number]           PATIENT TYPE:  I  LOCATION:  0003                         FACILITY:  [facility]  PHYSICIAN:  Jene Every, M.D.    DATE OF BIRTH:  1951-05-11  DATE OF PROCEDURE:  12/16/2010 DATE OF DISCHARGE:                              OPERATIVE REPORT   PREOPERATIVE DIAGNOSES: 1. Degenerative joint disease, right knee. 2. Flexion contracture of the left knee.  POSTOPERATIVE DIAGNOSIS:  Degenerative joint disease, right knee.  PROCEDURE PERFORMED: 1. Right total knee arthroplasty. 2. Manipulation of the left knee under anesthesia.  ANESTHESIA:  General.  ASSISTANT:  Roma Schanz.  COMPONENTS:  DePuy rotating platform, 5 femur, 5 tibia, 10-mm insert, 41 patella.  HISTORY:  A 60 year old with end-stage osteoarthrosis, indicated for replacement of the degenerated joint, failing conservative treatment. Risk and benefits discussed including bleeding, infection, damage to neurovascular structures, suboptimal range of motion, DVT, PE, anesthetic complications, etc.  TECHNIQUE:  The patient in supine position.  After induction of adequate anesthesia, 2 g of Kefzol with slight knee flexion contracture following his total knee on the left, we performed gentle manipulation under anesthesia, performed an extension maneuver.  We had limited improvement in his extension.  This was a mild manipulation as he had severe osteoarthritis of his hip and we wanted to avoid aggravating that. Following this, there was about 10 degrees flexion contracture.  We moved towards replacing the right.  He had no flexion contracture on the right.  Right lower extremity was prepped and draped and exsanguinated in usual sterile fashion, thigh tourniquet inflated to 300 mmHg. Midline incision was made of the right knee, medial parapatellar arthrotomy after full-thickness flaps were developed, elevated the  soft tissues medially preserving the medial and collateral ligament.  Everted the patella, knee was flexed.  Tricompartmental osteoarthrosis was noted, grade 4 changes on both femoral condyles, and a large grade 4 lesion of the lateral femoral condyle at the sulcus and the medial femoral condyle, also the patella.  Step drill was utilized to enter the femoral  canal, it was irrigated, 5 degrees right was placed with 11 off the distal femur.  This was pinned and the distal femoral cut was then made. We sized the distal femur off the anterior cortex to a 5 pin in 3 areas of external rotation, then made our chamfer and anterior-posterior cuts with soft tissues protected at all times.  Attention turned towards the tibia, external alignment guide parallel to the shaft, bisecting the ankle, 2 off the low side which was medial, which extends lateral.  This was pinned and performed proximal tibial cut.  Following this, we removed remnants of medial and lateral menisci, cauterized the geniculate vessels.  We used a flexion and extension block at 10 mm, they were equivalent and symmetric.  Good soft tissue balance was noted. Next, we turned our attention towards the completion of the tibia.  This was subluxed with __________ .  We used a 5 tibia rotating for maximal coverage just on the medial third of the tibial tubercle, pinned it, removed osteophytes,  central drill and punch guide was then applied.  We then turned attention to the box cut of the femur bisecting the notch in the condyles and pinned the box cut jig and performed our box cut removing the PCL.  We then removed this and placed a trial 5 femur and a 10-mm insert.  We had full extension with full flexion and good stability of varus valgus stressing at 0 and 30 degrees and good patellofemoral tracking.  We then turned attention towards the patella, it was measured thickness at 28, we planed it to an 18 using a 41 as our guide.  We then  drilled our peg holes medializing the patella and placed a trial patella and then trialed flexion extension, good patellofemoral tracking.  We selected those components.  All components were then removed.  We checked posteriorly, irrigated the wound with pulsatile lavage, placed a clean sheath, flexed, dried all surfaces.  He had a total hip on the right side, so we were careful not to load the joint. We then pressurized cement in the tibial canal digitally and then impacted the tibial tray on to the tibia 5, redundant cement removed. We cemented the femur, impacted it, redundant cement removed, 10 trial insert.  The knee was reduced, axial load applied throughout the curing of cement.  Redundant cement removed.  We cemented the patella as well. After full curing of the cement, we removed redundant cement, we had good flexion and extension and good stability, we removed the trial, irrigated posteriorly and removed any redundant cement.  Bone wax placed on the cancellous surfaces.  We put a 10-mm permanent insert, impacted it into place; full extension, full flexion and good patellofemoral tracking.  Negative anterior drawer.  We placed a Hemovac, brought out through lateral stab wound of the skin.  We put Marcaine with epinephrine in the joint.  We also checked the periosteum and placed Marcaine with epinephrine in the posterior capsule.  With slight flexion, we repaired the patellar arthrotomy with #1 Vicryl interrupted figure-of-eight suture, subcu closed with 2-0 Vicryl simple sutures. Skin was reapproximated with staples.  The patient had flexion 90 degrees following it to gravity.  We placed sterile dressing. Tourniquet was deflated.  At the end, there was adequate revascularization appreciated.  The patient had good pulses distally after that.  The patient tolerated the procedure well.  No complications.  Tourniquet was 1 hour and 10 minutes.     Jene Every,  M.D.     Cordelia Pen  D:  12/16/2010  T:  12/16/2010  Job:  [id number]  Electronically Signed by Jene Every M.D. on 12/27/2010 02:24:20 PM

## 2011-01-07 NOTE — Op Note (Signed)
NAME:  Anthony [name], Anthony [name] NO.:  [id number]   MEDICAL RECORD NO.:  [id number]          PATIENT TYPE:  AMB   LOCATION:  DAY                          FACILITY:  [facility]   PHYSICIAN:  Jene Every, M.D.    DATE OF BIRTH:  Dec 03, 1950   DATE OF PROCEDURE:  06/02/2006  DATE OF DISCHARGE:                                 OPERATIVE REPORT   PREOPERATIVE DIAGNOSIS:  Medial meniscus tear, right knee.   POSTOPERATIVE DIAGNOSES:  1. Medial meniscus tear, right knee.  2. Grade 3 chondromalacia medial compartment.  3. Grade 4 chondromalacia of the patellofemoral joint.   PROCEDURE PERFORMED:  1. Right knee arthroscopy.  2. Partial medial meniscectomy.  3. Chondroplasty of the medial femoral condyle, patella, femoral sulcus.   ANESTHESIA:  General.   SURGEON:  Javier Docker, M.D.   ASSISTANT:  None.   BRIEF HISTORY AND INDICATIONS:  A 60 year old with knee pain, MRI indicating  meniscus tear and degenerative changes.  Operative intervention was  indicated for debridement, partial resection of the meniscus.  Risks and  benefits discussed including bleeding, infection, no change in symptoms,  worsening symptoms, need for repeat debridement, or total knee arthroplasty  in the future.   TECHNIQUE:  The patient in supine position.  After the induction of adequate  general anesthesia, 2 g of Kefzol, the right lower extremity was prepped and  draped in the usual sterile fashion.  A lateral parapatellar portal and a  superomedial parapatellar portal was fashioned with a #11 blade.  Ingress  cannula atraumatically placed.  Irrigant was utilized to insufflate the  joint.  Under direct visualization, the medical parapatellar portal was  fashioned with a #11 blade after localization with an 18 gauge needle,  sparing the medial meniscus.  I noted there was extensive grade 3 changes in  the femoral condyle.  A small area was near grade 4, approximately 1.5 cm x  2 cm, __________   weightbearing surface of the femoral condyle, complex tear  of the posterior third of the medial meniscus.  This was resected with a  straight basket rongeur and further contoured with a 14 coude shaver to a  stable base.  The coude shaver was introduced and utilized to perform a  chondroplasty of the femoral condyle and the tibial plateau.  There were  extensive patellofemoral changes noted and grade 4 changes in the sulcus, a  chondral flap tear and loose bodies.  These were evacuated and resected with  the straight basket rongeur, and a chondroplasty was performed of the  patella and of the femoral sulcus.  There were grade 4 changes in the  central portion of the femoral sulcus.  ACL and PCL were essentially  unremarkable.  Lateral compartment revealed some minor degenerative changes  with grade 2 of the femoral condyle, tibial __________ meniscus which was  stable to probe palpation without evidence of tear.  Gutters were  unremarkable, copiously lavaged all compartments again.  I reexamined the  femoral condyle and the sulcus __________ cartilaginous debris amenable to  any further surgical intervention.  Meniscus  was stable to probe palpation.   All instrumentation was removed.  The portals were closed with 4-0 nylon  simple sutures.  Marcaine 0.25% with epinephrine was infiltrated in the  joint.  Wound was dressed sterilely.  He was awoken without difficulty and  transported to the recovery room in satisfactory condition.   The patient tolerated the procedure well with no complications.      Jene Every, M.D.  Electronically Signed     JB/MEDQ  D:  06/02/2006  T:  06/04/2006  Job:  [id number]

## 2011-01-09 NOTE — Discharge Summary (Signed)
NAME:  KEKOA, FYOCK NO.:  [id number]  MEDICAL RECORD NO.:  [id number]           PATIENT TYPE:  I  LOCATION:  1617                         FACILITY:  [facility]  PHYSICIAN:  Jene Every, M.D.    DATE OF BIRTH:  1951-05-28  DATE OF ADMISSION:  12/16/2010 DATE OF DISCHARGE:  12/19/2010                              DISCHARGE SUMMARY   ADMISSION DIAGNOSES:  Includes: 1. Degenerative joint disease, right knee, secondary diagnosis of     contracture of left total knee arthroplasty. 2. Hypertension. 3. Gastroesophageal reflux disease. 4. Hypercholesteremia. 5. Early glaucoma.  DISCHARGE DIAGNOSES: 1. Degenerative joint disease, right knee, secondary diagnosis of     contracture of left total knee arthroplasty; status post right     total knee arthroplasty. 2. Manipulation under anesthesia, left total knee. 3. Hypertension. 4. Gastroesophageal reflux disease. 5. Hypercholesteremia. 6. Early glaucoma. HISTORY:  Mr. Gallien is well known to our practice.  He has undergone multiple surgeries in the past year for significant arthritis, actually undergone left total knee arthroplasty less than 1 year ago.  He continues to have difficulty with range of motion in that knee with a slight contracture.  He also has had persistent disabling right knee pain.  Films of the right knee do show end-stage osteoarthritis of the knee.  At this point, Dr. Shelle Iron felt it appropriate to take the patient to OR, do manipulation of the left knee, also to proceed with a right total knee arthroplasty .  PROCEDURES:  The patient was taken to the OR, underwent right total knee arthroplasty.  Surgeon, Dr. Jene Every.  Assistant, Roma Schanz PA-C.  Anesthesia was general.  Complications were none.  The patient also underwent manipulation of the left knee under anesthesia.  CONSULTS:  PT, OT, Care Management.  LABORATORY DATA:  Postoperative labs showed a white cell count  17..5, hemoglobin 12.3, hematocrit 36.9, white cell count did normalize at time of discharge to 9.2, hemoglobin stable at 12.3 with hematocrit 37.0. Routine coagulation studies were obtained.  The patient was placed on Coumadin postoperatively for DVT prophylaxis.  At time of discharge, he was therapeutic with INR of 2.23.  Routine chemistries obtained, postoperatively showed sodium 137, potassium 3.4 with a normal BUN and creatinine.  Sodium remained normal at 135 at time of discharge, potassium was slightly decreased at 3.2 and this was supplemented.  GFR greater than 60.  Blood type is A negative.  HOSPITAL COURSE:  The patient was admitted, taken to the OR and underwent the above stated procedure without difficulty.  He was then transferred to PACU and then to the orthopedic floor for continued postoperative care.  One Hemovac drain was placed intraoperatively.  He was placed on PCA analgesics for pain relief..  By postop day #1, the patient was doing fairly well.  Pain was controlled.  His vital signs were stable, he was afebrile.  Coumadin was started for DVT prophylaxis, also bridged with Lovenox.  PT, OT was consulted.  Postop day #2, the patient continued to do well.  Labs were stable.  He was responded to the Coumadin.  Dressing was changed.  Incision was clean, dry and intact.  No evidence of infection.  He had minimal edema to the lower extremities.  Motor and neurovascular function remained intact.  Postop day #3, it was felt at this time the patient was stable to be discharged home.  He is therapeutic on his Coumadin with INR of 2.2.  His incision remained clean, dry and intact.  DISPOSITION:  The patient stable be discharged home with home health needs.  He is to follow up with Dr. Shelle Iron in approximately 10 to 14 days for suture removal and x-ray.  ACTIVITIES:  To weight bear as tolerated.  Elevate 6 times a day for about 20 minutes at time for edema control.  Continue  to use knee immobilizer, doing straight leg raise x 10.  MEDICATIONS:  As per med rec sheet.  CONDITION ON DISCHARGE:  Stable.  FINAL DIAGNOSIS:  Status post right total knee arthroplasty.     Roma Schanz, P.A.   ______________________________ Jene Every, M.D.    CS/MEDQ  D:  01/06/2011  T:  01/06/2011  Job:  [id number]  Electronically Signed by Roma Schanz P.A. on 01/06/2011 04:21:55 PM Electronically Signed by Jene Every M.D. on 01/09/2011 01:30:06 PM

## 2011-01-26 HISTORY — PX: OTHER SURGICAL HISTORY: SHX169

## 2011-03-03 ENCOUNTER — Other Ambulatory Visit: Payer: Self-pay | Admitting: Specialist

## 2011-03-03 ENCOUNTER — Ambulatory Visit (HOSPITAL_COMMUNITY)
Admission: RE | Admit: 2011-03-03 | Discharge: 2011-03-03 | Disposition: A | Discharge status: Home / Self Care | Payer: Managed Care, Other (non HMO) | Source: Ambulatory Visit | Attending: Specialist | Admitting: Specialist

## 2011-03-03 ENCOUNTER — Encounter (HOSPITAL_COMMUNITY): Payer: Managed Care, Other (non HMO)

## 2011-03-03 ENCOUNTER — Other Ambulatory Visit (HOSPITAL_COMMUNITY): Payer: Self-pay | Admitting: Specialist

## 2011-03-03 DIAGNOSIS — M1610 Unilateral primary osteoarthritis, unspecified hip: Secondary | ICD-10-CM | POA: Insufficient documentation

## 2011-03-03 DIAGNOSIS — M169 Osteoarthritis of hip, unspecified: Secondary | ICD-10-CM | POA: Insufficient documentation

## 2011-03-03 DIAGNOSIS — Z01818 Encounter for other preprocedural examination: Secondary | ICD-10-CM

## 2011-03-03 DIAGNOSIS — Z01812 Encounter for preprocedural laboratory examination: Secondary | ICD-10-CM | POA: Insufficient documentation

## 2011-03-03 DIAGNOSIS — M948X9 Other specified disorders of cartilage, unspecified sites: Secondary | ICD-10-CM | POA: Insufficient documentation

## 2011-03-03 LAB — CBC
MCH: 28.1 pg (ref 26.0–34.0)
MCHC: 33.3 g/dL (ref 30.0–36.0)
Platelets: 234 10*3/uL (ref 150–400)
RDW: 13.4 % (ref 11.5–15.5)

## 2011-03-03 LAB — DIFFERENTIAL
Basophils Absolute: 0.1 10*3/uL (ref 0.0–0.1)
Basophils Relative: 1 % (ref 0–1)
Eosinophils Absolute: 0.4 10*3/uL (ref 0.0–0.7)
Monocytes Relative: 8 % (ref 3–12)
Neutrophils Relative %: 64 % (ref 43–77)

## 2011-03-03 LAB — URINALYSIS, ROUTINE W REFLEX MICROSCOPIC
Bilirubin Urine: NEGATIVE
Ketones, ur: NEGATIVE mg/dL
Specific Gravity, Urine: 1.016 (ref 1.005–1.030)
Urobilinogen, UA: 0.2 mg/dL (ref 0.0–1.0)

## 2011-03-03 LAB — PROTIME-INR: INR: 0.96 (ref 0.00–1.49)

## 2011-03-03 LAB — COMPREHENSIVE METABOLIC PANEL
AST: 17 U/L (ref 0–37)
Albumin: 4.1 g/dL (ref 3.5–5.2)
Calcium: 10.2 mg/dL (ref 8.4–10.5)
Chloride: 100 mEq/L (ref 96–112)
Creatinine, Ser: 0.81 mg/dL (ref 0.50–1.35)

## 2011-03-03 LAB — URINE MICROSCOPIC-ADD ON

## 2011-03-03 LAB — SURGICAL PCR SCREEN
MRSA, PCR: NEGATIVE
Staphylococcus aureus: NEGATIVE

## 2011-03-03 LAB — TYPE AND SCREEN: Antibody Screen: NEGATIVE

## 2011-03-10 ENCOUNTER — Inpatient Hospital Stay (HOSPITAL_COMMUNITY): Payer: Managed Care, Other (non HMO)

## 2011-03-10 ENCOUNTER — Inpatient Hospital Stay (HOSPITAL_COMMUNITY)
Admission: RE | Admit: 2011-03-10 | Discharge: 2011-03-13 | DRG: 470 | Disposition: A | Discharge status: Home Health | Payer: Managed Care, Other (non HMO) | Source: Ambulatory Visit | Attending: Specialist | Admitting: Specialist

## 2011-03-10 DIAGNOSIS — E669 Obesity, unspecified: Secondary | ICD-10-CM | POA: Diagnosis present

## 2011-03-10 DIAGNOSIS — Z96649 Presence of unspecified artificial hip joint: Secondary | ICD-10-CM

## 2011-03-10 DIAGNOSIS — M1610 Unilateral primary osteoarthritis, unspecified hip: Principal | ICD-10-CM | POA: Diagnosis present

## 2011-03-10 DIAGNOSIS — Z01812 Encounter for preprocedural laboratory examination: Secondary | ICD-10-CM

## 2011-03-10 DIAGNOSIS — M169 Osteoarthritis of hip, unspecified: Principal | ICD-10-CM | POA: Diagnosis present

## 2011-03-10 DIAGNOSIS — E876 Hypokalemia: Secondary | ICD-10-CM | POA: Diagnosis not present

## 2011-03-10 DIAGNOSIS — E7800 Pure hypercholesterolemia, unspecified: Secondary | ICD-10-CM | POA: Diagnosis present

## 2011-03-10 DIAGNOSIS — Z96659 Presence of unspecified artificial knee joint: Secondary | ICD-10-CM

## 2011-03-10 DIAGNOSIS — H409 Unspecified glaucoma: Secondary | ICD-10-CM | POA: Diagnosis present

## 2011-03-11 LAB — BASIC METABOLIC PANEL
BUN: 12 mg/dL (ref 6–23)
CO2: 31 mEq/L (ref 19–32)
Calcium: 8.6 mg/dL (ref 8.4–10.5)
Creatinine, Ser: 0.81 mg/dL (ref 0.50–1.35)
Glucose, Bld: 120 mg/dL — ABNORMAL HIGH (ref 70–99)

## 2011-03-11 LAB — CBC
Hemoglobin: 10.4 g/dL — ABNORMAL LOW (ref 13.0–17.0)
MCH: 28.8 pg (ref 26.0–34.0)
MCV: 85.9 fL (ref 78.0–100.0)
RBC: 3.61 MIL/uL — ABNORMAL LOW (ref 4.22–5.81)

## 2011-03-11 NOTE — Op Note (Signed)
NAME:  Anthony [name], Anthony [name] NO.:  [id number]  MEDICAL RECORD NO.:  [id number]  LOCATION:  0003                         FACILITY:  [facility]  PHYSICIAN:  Jene Every, M.D.    DATE OF BIRTH:  05/05/1951  DATE OF PROCEDURE: DATE OF DISCHARGE:                              OPERATIVE REPORT   PREOPERATIVE DIAGNOSIS:  Degenerative joint disease of the left hip.  POSTOPERATIVE DIAGNOSIS:  Degenerative joint disease of the left hip.  PROCEDURE PERFORMED:  Left total hip arthroplasty.  ANESTHESIA:  General.  ASSISTANT:  Madlyn Frankel. Charlann Boxer, MD and Roma Schanz, PA  BRIEF HISTORY:  This is a 60 year old who has had multiple joint replacements secondary to osteoarthrosis, severe.  He has had a significant disabling pain referable to the arthritis.  X-rays demonstrated osteoarthrosis, end stage.  He was indicated for replacement of degenerated joint.  Risks and benefits discussed including bleeding, infection, damage to vascular structures, no change in symptoms or worsening symptoms, need for repeat surgery, leg length discrepancy, dislocation, DVT, PE, anesthetic complications, etc.  TECHNIQUE:  With the patient in supine position, after adequate general anesthesia, 2 g of Kefzol, 600 mg of clindamycin, he was placed in right lateral decubitus position.  All bony prominences were well padded.  A hip holder utilized and pelvis stabilized.  The left hip, leg and peritrochanteric region were prepped and draped in usual sterile fashion.  Standard posterolateral approach to the hip was utilized for the incision based upon the greater trochanter.  Subcutaneous tissue was dissected.  Electrocautery was utilized to achieve hemostasis.  Fascia lata identified and divided in line of skin incision.  Self-retaining retractors were placed.  Excised the bursa.  I performed an abductor tenotomy.  Identified the external rotators, they were tagged and reflected posteriorly to protect  sciatic nerve throughout the case. Capsulectomy was performed.  Severe osteoarthrosis was noted.  Hip was dislocated.  Severe osteoarthrosis of the head was noted.  Osteotomy performed 1 fingerbreadth above the femoral neck with an oscillating saw.  Then used an initiator and a box chisel to enter the femoral canal, which was then irrigated and we sequentially reamed to a 16 mm diameter with good cortical purchase of the appropriate version, linear with the femoral canal.  This was sized, utilized on the contralateral side.  We then probed the femoral canal with a long curette.  There was no breach of the cortex noted.  We then sequentially broached lateralizing with the appropriate version to a 15 mm slightly countersunk, so we could use a calcar planer.  Following this, we removed and used a large 16.5 and it sat satisfactorily with appropriate version.  Attention was then turned towards the acetabulum.  Self- retaining retractors were then placed.  Severe osteoarthrosis was noted. The acetabulum was then debrided.  We excised the remaining labrum and an osteophyte was then removed with an osteotome.  We used a centralizing reamer 44 and the appropriate version of the curved reamer handle.  This was down to good bleeding bone into the medial wall.  We then sequentially reamed to a 51, he had 52 on the contralateral side. There was still some sclerotic  bone superiorly and posteriorly.  We then selected to increase the size by 1 to get a good bleeding bone within the sclerotic area.  Then with the appropriate version, we selected a 53 Duraloc cup, impacted it in the appropriate place utilizing a curved inserter; 45 degrees of abduction, 20 degrees of anteversion.  This was impacted and this was seated.  We placed 1 screw after drilling cephalad, which was 30 mm in length.  This was directly superior.  Good purchase.  Placed a centralized cover.  We chose a permanent liner polyethylene,  which was impacted in place with excellent stability. Attention was turned back towards the femur.  It was copiously irrigated.  We placed a 16.5 Prodigy fully porous-coated stem, impacted in the appropriate anteversion, seating flush with the osteotomy site. The trial 1.5 was then placed, hip was reduced, found to be stable throughout full range including flexion with internal rotation and good extension with the knee flexed.  Leg lengths were equivalent.  It then was dislocated and the permanent ceramic +1.5 head was then impacted on to the trunnion after cleaning the trunnion.  Hip was again reduced, found to be stable throughout full range.  Meticulous hemostasis was achieved with electrocautery.  Following this and copious irrigation, we repaired the fascia lata with #1 Ethibond interrupted figure-of-8 sutures as well as #1 Vicryl, subcu with multiple layers of 2-0 Vicryl simple sutures.  Skin was reapproximated with staples and was dressed sterilely.  Placed supine on hospital bed, good leg lengths were noted. Abduction pillow placed, extubated without difficulty and transported to recovery room in satisfactory condition.  The patient tolerated the procedure well.  No complications.  Again, assistant mentioned above.  Blood loss was approximately 300 cc.     Jene Every, M.D.     Cordelia Pen  D:  03/10/2011  T:  03/10/2011  Job:  [id number]  Electronically Signed by Jene Every M.D. on 03/11/2011 03:15:36 PM

## 2011-03-11 NOTE — H&P (Signed)
NAME:  Anthony [name], Anthony [name] NO.:  [id number]  MEDICAL RECORD NO.:  [id number]  LOCATION:                                 FACILITY:  PHYSICIAN:  Jene Every, M.D.    DATE OF BIRTH:  1951-03-01  DATE OF ADMISSION: DATE OF DISCHARGE:                             HISTORY & PHYSICAL   DATE OF ADMISSION:  March 10, 2011  CHIEF COMPLAINT:  Left hip pain.  HISTORY:  Anthony [name] is well-known to our practice.  Anthony [name] has undergone multiple joint replacement surgeries over the course of the past year. Anthony [name] is now noting persistent disabling left groin pain.  Radiographs do reveal end-stage osteoarthritis of left hip.  It was felt at this time Anthony [name] would benefit from a total hip replacement.  The risks and benefits of this were discussed with the patient.  Anthony [name] does wish to proceed.  MEDICAL HISTORY: 1. Hypertension. 2. GERD. 3. Hypercholesterolemia. 4. Early glaucoma.  CURRENT MEDICATIONS: 1. Nexium 40 mg daily. 2. HCTZ 25 mg daily. 3. Amlodipine 10 mg 1 p.o. daily 4. Zetia 10 mg daily. 5. Benazepril HCl 40 mg 1 daily. 6. Sertraline 50 mg 1 p.o. daily. 7. Aspirin 81 mg daily. 8. Xalatan eye drops nightly. 9. Cosopt 1 drop b.i.d. 10.Robaxin 500 mg p.r.n. 11.Norco 10-325 p.r.n.  ALLERGIES:  CODEINE and LATEX.  PAST SURGICAL HISTORY:  Previous surgeries include hernia repair, bilateral knee arthroscopies, bilateral knee replacements, cholecystectomy, right total hip arthroplasty.  SOCIAL HISTORY:  The patient is married.  History negative for tobacco. Occasionally drinks alcohol.  Family will be caregivers following surgery.  PRIMARY CARE PHYSICIAN:  Dr. Chilton Si.  CARDIOLOGIST:  Dr. Nicki Guadalajara.  FAMILY HISTORY:  Father deceased at 66 from PE; mother at 68, history of diabetes as well as heart arrhythmia.  Anthony [name] has 2 sisters, one deceased with lung cancer at age 39, other with history of hypertension.  Brother is living with history of coronary artery disease as well  as diabetes and CVA.  REVIEW OF SYSTEMS:  GENERAL:  The patient denies any fever, chills, night sweats, or bleeding tendencies.  CNS:  No blurred or double vision, seizure, headache, or paralysis.  RESPIRATORY:  No shortness of breath, productive cough, or hemoptysis.  CARDIOVASCULAR:  No chest pain, angina, or orthopnea.  GU:  No dysuria, hematuria, or discharge. GI: No nausea, vomiting, diarrhea, constipation, melena. MUSCULOSKELETAL:  As per HPI.  PHYSICAL EXAMINATION:  VITAL SIGNS:  On physical exam, height is 5 feet 10 inches, weight is 240, pulse 80, respirations 10, BP 156/86. GENERAL:  In general, this is a healthy gentleman seen upright, in no acute distress.  However, Anthony [name] does walk with an antalgic gait. HEENT:  Atraumatic, normocephalic.  Pupils equal, round, and reactive to light.  EOMs intact. NECK:  Supple.  No lymphadenopathy. CHEST:  Clear to auscultation bilaterally.  No rhonchi, wheezes, or rales. HEART:  Regular rate and rhythm without murmurs, gallops, or rubs. ABDOMEN:  Soft, nontender, nondistended.  Bowel sounds x4. SKIN:  No rashes or lesions are noted. EXTREMITIES:  In regards to the extremities, the patient has pain with range of motion of the left hip.  Compartments are soft.  Anthony [name] does have a slight contracture on the left knee.  IMPRESSION:  Degenerative joint disease, left hip.  PLAN:  The patient will be admitted to [facility] and undergo a left total hip arthroplasty.     Roma Schanz, P.A.   ______________________________ Jene Every, M.D.    CS/MEDQ  D:  02/25/2011  T:  02/25/2011  Job:  [id number]  Electronically Signed by Roma Schanz P.A. on 03/07/2011 10:07:50 AM Electronically Signed by Jene Every M.D. on 03/11/2011 03:15:34 PM

## 2011-03-12 LAB — CBC
MCH: 28.6 pg (ref 26.0–34.0)
MCV: 84.4 fL (ref 78.0–100.0)
Platelets: 207 10*3/uL (ref 150–400)
RDW: 13.4 % (ref 11.5–15.5)

## 2011-03-12 LAB — BASIC METABOLIC PANEL
CO2: 30 mEq/L (ref 19–32)
Calcium: 9.1 mg/dL (ref 8.4–10.5)
Creatinine, Ser: 0.74 mg/dL (ref 0.50–1.35)
GFR calc Af Amer: >60 mL/min (ref >60)

## 2011-03-13 LAB — CBC
MCV: 84.6 fL (ref 78.0–100.0)
Platelets: 184 10*3/uL (ref 150–400)
RDW: 13.5 % (ref 11.5–15.5)
WBC: 11.1 10*3/uL — ABNORMAL HIGH (ref 4.0–10.5)

## 2011-03-13 LAB — BASIC METABOLIC PANEL
Calcium: 8.8 mg/dL (ref 8.4–10.5)
Chloride: 97 mEq/L (ref 96–112)
Creatinine, Ser: 0.74 mg/dL (ref 0.50–1.35)
GFR calc Af Amer: >60 mL/min (ref >60)
GFR calc non Af Amer: >60 mL/min (ref >60)

## 2011-04-05 NOTE — Discharge Summary (Signed)
NAMEERHARDT, DADA NO.:  [id number]  MEDICAL RECORD NO.:  [id number]  LOCATION:  1613                         FACILITY:  [facility]  PHYSICIAN:  Jene Every, M.D.    DATE OF BIRTH:  April 08, 1951  DATE OF ADMISSION:  03/10/2011 DATE OF DISCHARGE:  03/13/2011                              DISCHARGE SUMMARY   ADMISSION DIAGNOSES: 1. Degenerative joint disease, left hip. 2. Hypertension. 3. Gastroesophageal reflux disease. 4. Hypercholesteremia. 5. Early glaucoma.  DISCHARGE DIAGNOSES: 1. Degenerative joint disease, left hip. 2. Hypertension. 3. Gastroesophageal reflux disease. 4. Hypercholesteremia. 5. Early glaucoma. 6. Status post left total hip arthroplasty with postoperative bleeding     leading to a hematoma, left thigh. 7. Hypokalemia, corrected.  HISTORY:  Mr. Anthony [name] is a well-known patient to our practice.  He has undergone multiple joint replacement surgeries over the course of the past year.  He is now in a persistent disabling left groin pain. Radiographs do reveal end-stage osteoarthritis of the left hip, so at this time he benefit from a total hip arthroplasty.  PROCEDURE:  The patient was taken to the OR, underwent left total hip arthroplasty.  SURGEON:  Jene Every, M.D.  ASSISTANT:  Madlyn Frankel. Charlann Boxer, M.D., and Roma Schanz, P.A.-C.  ANESTHESIA:  General.  COMPLICATIONS:  None.  CONSULTS:  PT/OT.  LABORATORY DATA:  Admission labs showed white cell count 11.5, hemoglobin 10.4, hematocrit 31.0.  This was followed throughout the hospital course.  White cell remained stable at 11.1, hemoglobin 9.3, hematocrit 27.5.  Routine chemistries showed sodium 136, potassium 3.9 with normal BUN and creatinine, slightly elevated glucose of 128. Potassium did drop to 3.0, however, was supplemented prior to discharge. GFR is greater than 60.  No new chest x-ray was obtained.  Postoperative films showed appropriate position of the hip  prosthesis.  HOSPITAL COURSE:  The patient was admitted, taken to the OR, and underwent the above-stated procedures without difficulties, then transferred to the PACU and then to the orthopedic floor.  I was called once he got to the floor with a significant amount of bleeding at his left thigh.  I did go and assess the patient.  He had bled under the incision, had quite a bit of bloody drainage, however, no active bleeding was noted at that time.  We did place a compressive bandage on his thigh with ice, limited activity over the course of next 24 hours. Postoperative day #1, the patient was doing fairly well.  Pain was well controlled on PCA analgesics.  He describes achiness in his lower extremity as expected.  Vital signs were stable.  He was afebrile.  A left thigh was soft, nontender.  Neurovascular function intact in lower extremities.  Xarelto was started per Pharmacy for DVT prophylaxis.  We did discontinue PCA and discharge planning was initiated.  Over the course of the next 2 days, the patient continued to progress.  Incision was evaluated on postop day #2.  The evaluating physician felt the incision was clean and dry, and no evidence of hematoma was noted at that time.  Diet was soft.  The patient did have a slight decrease in potassium, this was supplemented.  Postop day #3, it was felt he was stable for discharge home.  DISPOSITION:  The patient discharged home with home health PT/OT.  Wound care is to change his dressing daily.  ACTIVITIES:  Continue with ice and elevation, partial weightbearing to the lower extremity.  MEDICATIONS:  As per med rec sheet.  He needs to be on Xarelto for 21 days.  DIET:  Heart healthy.  CONDITION ON DISCHARGE:  Stable and improved.  FINAL DIAGNOSIS:  Doing well, status post left total hip arthroplasty.     Roma Schanz, P.A.   ______________________________ Jene Every, M.D.    CS/MEDQ  D:  04/04/2011  T:   04/05/2011  Job:  [id number]  Electronically Signed by Roma Schanz P.A. on 04/05/2011 09:59:48 AM Electronically Signed by Jene Every M.D. on 04/05/2011 01:24:51 PM

## 2011-08-18 ENCOUNTER — Other Ambulatory Visit: Payer: Self-pay | Admitting: Internal Medicine

## 2011-08-18 ENCOUNTER — Ambulatory Visit
Admission: RE | Admit: 2011-08-18 | Discharge: 2011-08-18 | Disposition: A | Discharge status: Home / Self Care | Payer: Managed Care, Other (non HMO) | Source: Ambulatory Visit | Attending: Internal Medicine | Admitting: Internal Medicine

## 2011-08-18 DIAGNOSIS — J189 Pneumonia, unspecified organism: Secondary | ICD-10-CM

## 2011-10-09 IMAGING — CR DG KNEE 1-2V PORT*R*
1 series · 2 of 2 positions shown · non-contrast
Comparison: 12/09/2010

CLINICAL DATA: Postop.

PORTABLE RIGHT KNEE - 1-2 VIEW

[Series 1: AP · right · 2 of 2 slices shown]
[im 1/2]
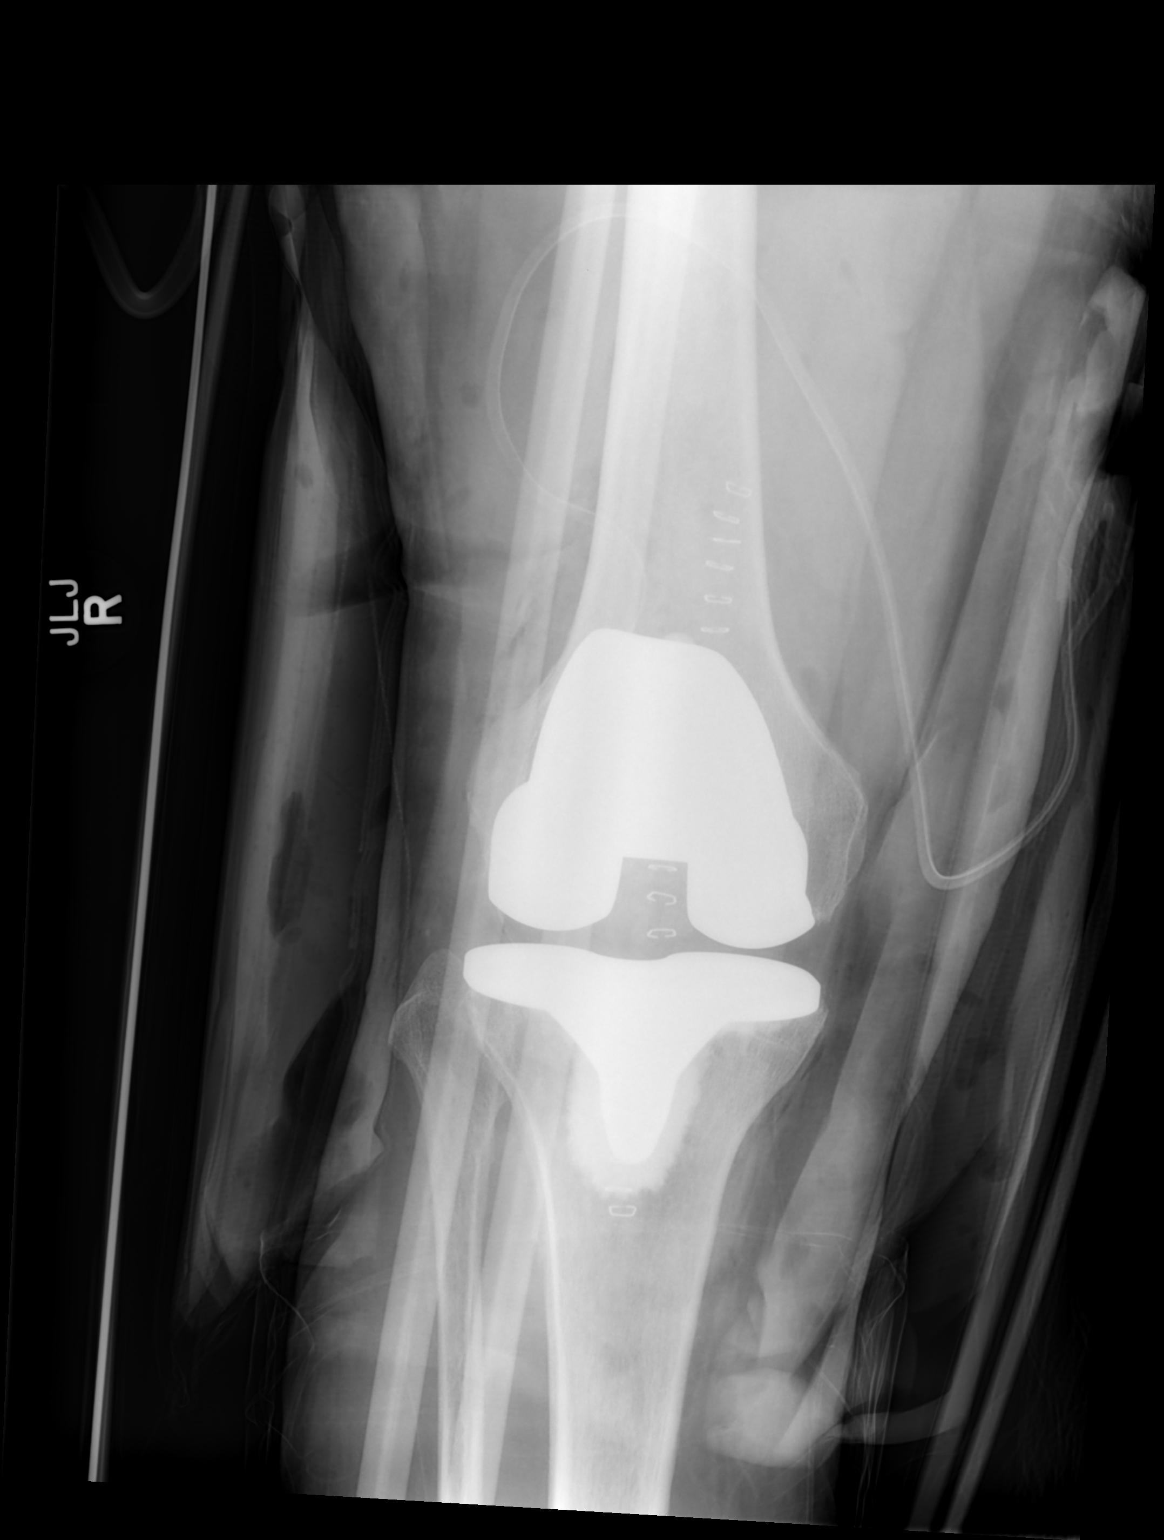
[im 2/2]
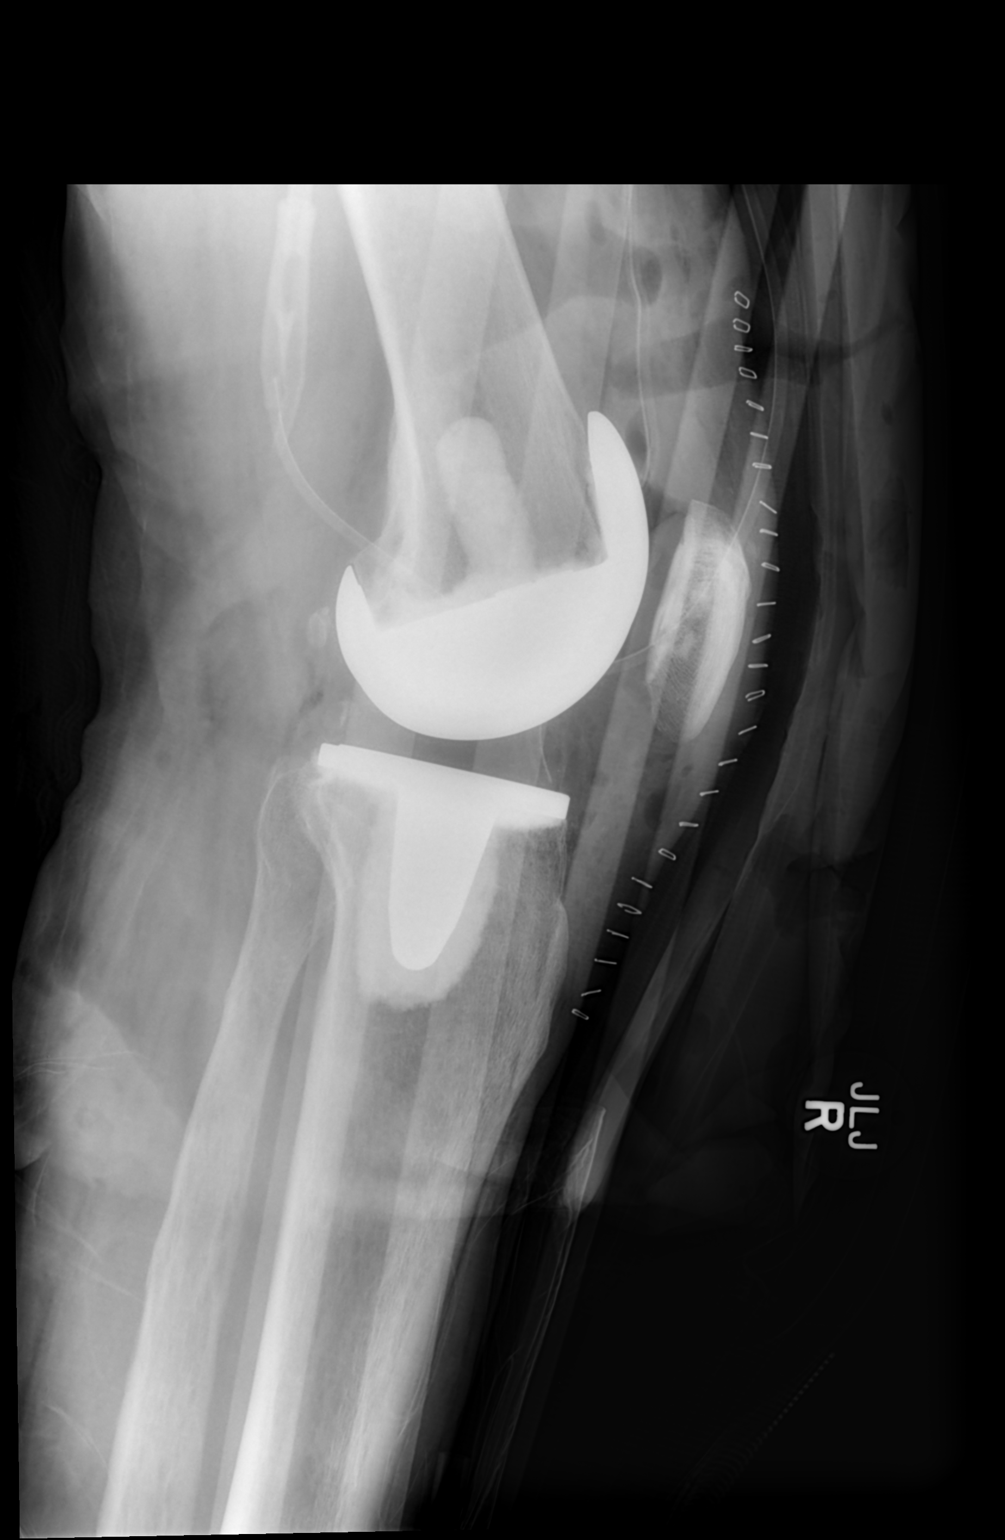

[2 of 2 positions shown; findings below may reference images not displayed]

FINDINGS: The patient is status post right knee arthroplasty.
Subcutaneous and joint air and fluid are noted.  Surgical drain and
skin staples are in place.
IMPRESSION: Right total knee arthroplasty with expected postoperative findings.

## 2012-01-01 IMAGING — CR DG PORTABLE PELVIS
1 series · 1 of 1 positions shown · non-contrast
Comparison: 09/02/2010

CLINICAL DATA: Postop from left hip arthroplasty.

PORTABLE PELVIS

[AP]
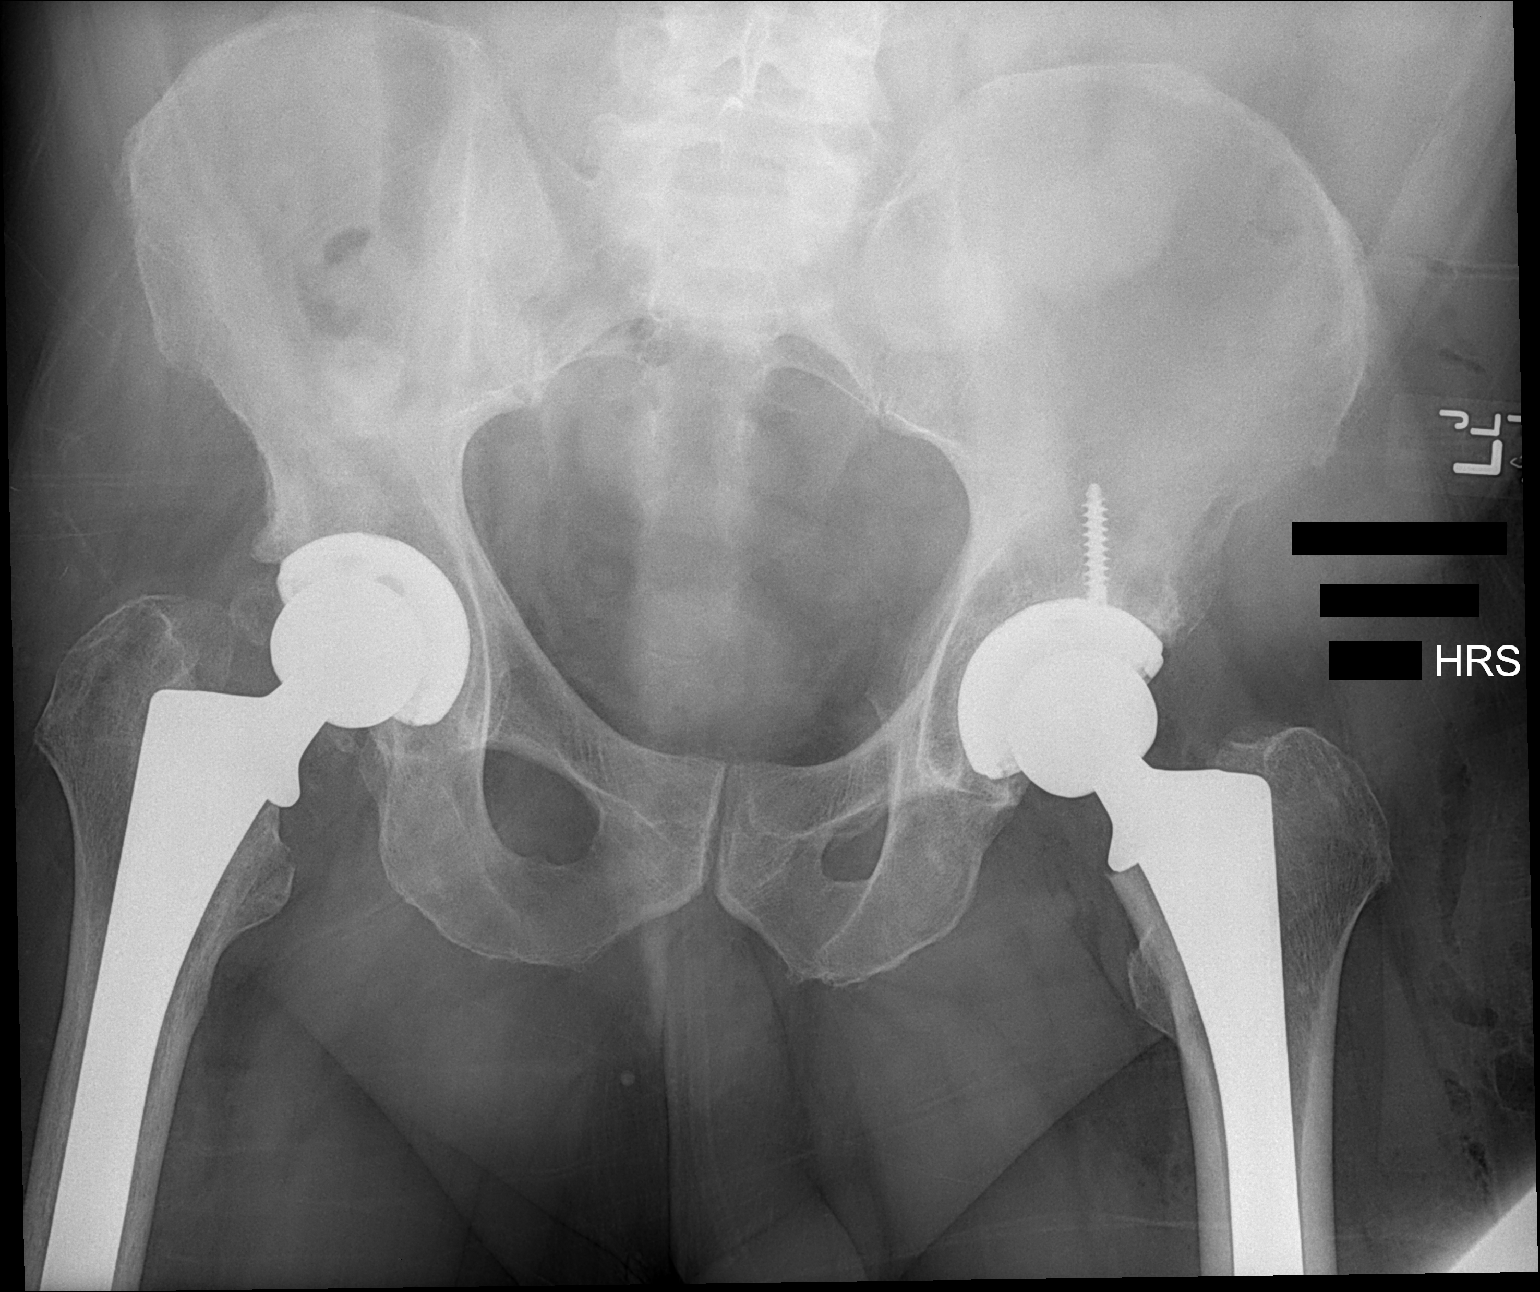

[1 of 1 positions shown; findings below may reference images not displayed]

FINDINGS: New bipolar left hip prosthesis is seen in appropriate
position.  The distal portion of the femoral component is not
visualized, but no fracture identified.  No evidence of
dislocation.  Preexisting right hip prosthesis remains normal in
appearance.
IMPRESSION: Bipolar left hip prosthesis in appropriate position.  No evidence
of fracture or dislocation.

## 2012-01-01 IMAGING — CR DG HIP 1V PORT*L*
1 series · 1 of 1 positions shown · non-contrast
Comparison: None.

CLINICAL DATA: Postop from left hip arthroplasty.

PORTABLE LEFT HIP - 1 VIEW

[AP]
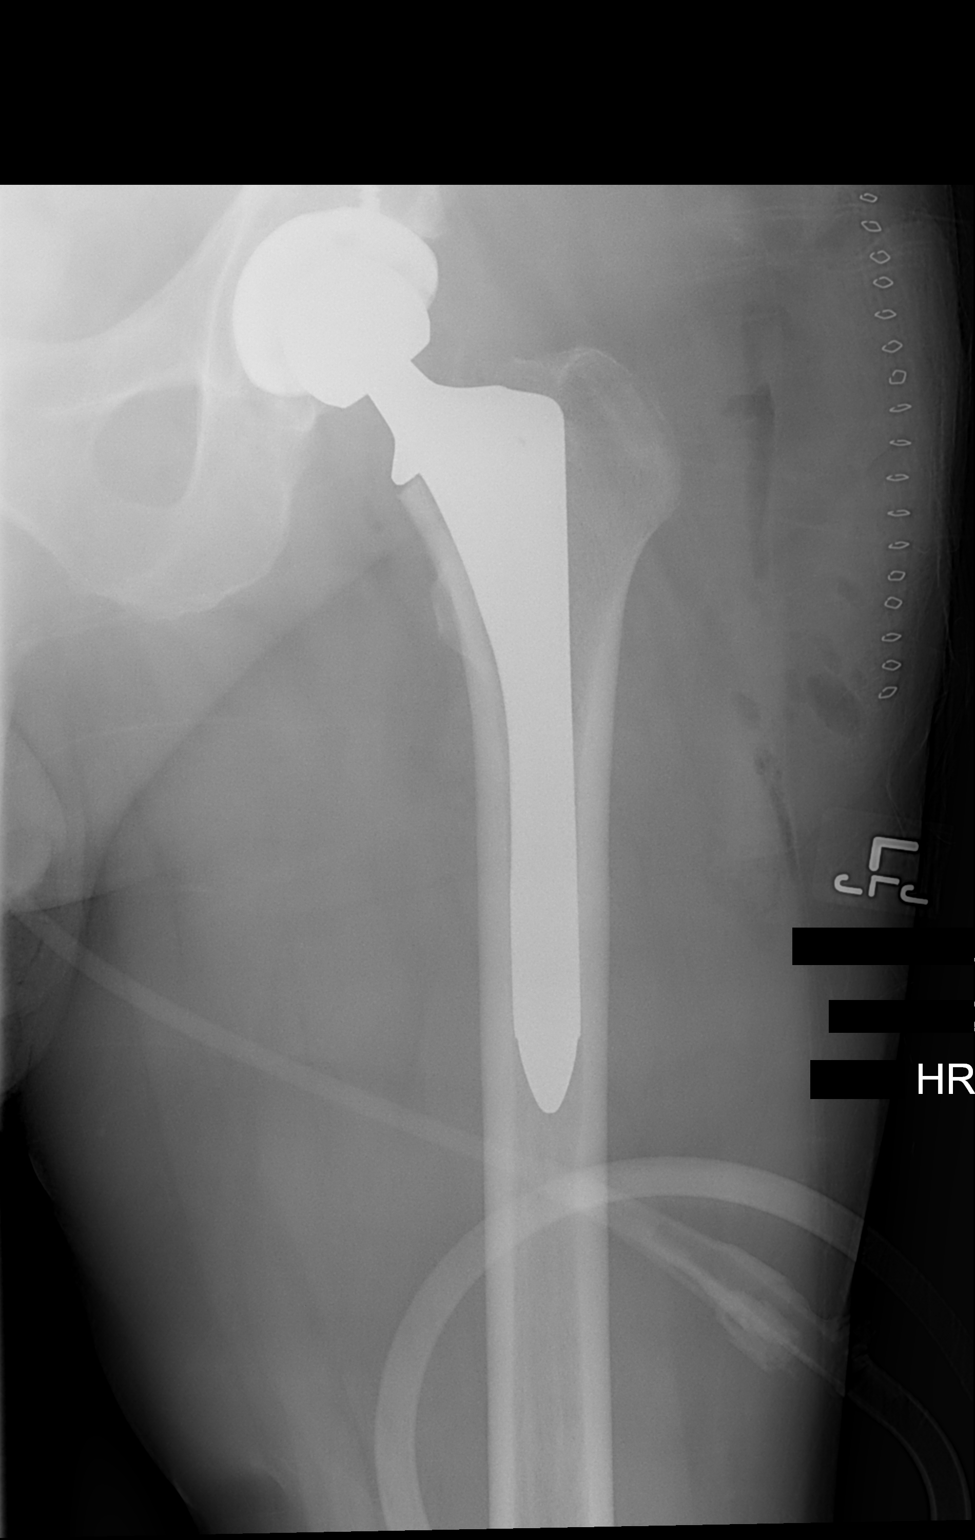

[1 of 1 positions shown; findings below may reference images not displayed]

FINDINGS: Bipolar left hip prosthesis is seen in appropriate
position.  No evidence of fracture or dislocation.  Postoperative
soft tissue gas noted as well as overlying skin staples.
IMPRESSION: Bipolar left hip prosthesis in appropriate position.  Negative for
fracture or dislocation.

## 2012-06-15 HISTORY — PX: CARDIOVASCULAR STRESS TEST: SHX262

## 2012-09-20 DIAGNOSIS — H35359 Cystoid macular degeneration, unspecified eye: Secondary | ICD-10-CM | POA: Insufficient documentation

## 2012-09-20 DIAGNOSIS — H33009 Unspecified retinal detachment with retinal break, unspecified eye: Secondary | ICD-10-CM | POA: Insufficient documentation

## 2012-09-20 DIAGNOSIS — H269 Unspecified cataract: Secondary | ICD-10-CM | POA: Insufficient documentation

## 2012-11-09 DIAGNOSIS — F32A Depression, unspecified: Secondary | ICD-10-CM | POA: Insufficient documentation

## 2012-11-09 DIAGNOSIS — F329 Major depressive disorder, single episode, unspecified: Secondary | ICD-10-CM | POA: Insufficient documentation

## 2012-11-09 DIAGNOSIS — K219 Gastro-esophageal reflux disease without esophagitis: Secondary | ICD-10-CM | POA: Insufficient documentation

## 2012-11-20 ENCOUNTER — Other Ambulatory Visit: Payer: Self-pay | Admitting: *Deleted

## 2012-11-20 MED ORDER — EZETIMIBE 10 MG PO TABS: 10.0000 mg | ORAL_TABLET | Freq: Every day | ORAL | Status: DC

## 2012-12-12 ENCOUNTER — Ambulatory Visit (INDEPENDENT_AMBULATORY_CARE_PROVIDER_SITE_OTHER): Payer: Medicare Other | Admitting: Internal Medicine

## 2012-12-12 ENCOUNTER — Encounter: Payer: Self-pay | Admitting: Internal Medicine

## 2012-12-12 VITALS — BP 150/90 | HR 62 | Temp 97.9°F | Resp 18 | Ht 71.0 in | Wt 253.0 lb

## 2012-12-12 DIAGNOSIS — I10 Essential (primary) hypertension: Secondary | ICD-10-CM

## 2012-12-12 DIAGNOSIS — L03039 Cellulitis of unspecified toe: Secondary | ICD-10-CM | POA: Insufficient documentation

## 2012-12-12 DIAGNOSIS — L03032 Cellulitis of left toe: Secondary | ICD-10-CM

## 2012-12-12 DIAGNOSIS — E785 Hyperlipidemia, unspecified: Secondary | ICD-10-CM | POA: Insufficient documentation

## 2012-12-12 MED ORDER — HYDROCHLOROTHIAZIDE 25 MG PO TABS: 25.0000 mg | ORAL_TABLET | Freq: Every day | ORAL | Status: DC

## 2012-12-12 MED ORDER — SERTRALINE HCL 50 MG PO TABS: 50.0000 mg | ORAL_TABLET | Freq: Every day | ORAL | Status: DC

## 2012-12-12 MED ORDER — FINASTERIDE 5 MG PO TABS: ORAL_TABLET | ORAL | Status: DC

## 2012-12-12 MED ORDER — BENAZEPRIL HCL 20 MG PO TABS: 20.0000 mg | ORAL_TABLET | Freq: Every day | ORAL | Status: DC

## 2012-12-12 MED ORDER — AMLODIPINE BESYLATE 10 MG PO TABS: 10.0000 mg | ORAL_TABLET | Freq: Every day | ORAL | Status: DC

## 2012-12-12 MED ORDER — AMOXICILLIN-POT CLAVULANATE 875-125 MG PO TABS: 1.0000 | ORAL_TABLET | Freq: Two times a day (BID) | ORAL | Status: DC

## 2012-12-12 MED ORDER — EZETIMIBE 10 MG PO TABS: 10.0000 mg | ORAL_TABLET | Freq: Every day | ORAL | Status: DC

## 2012-12-12 MED ORDER — ESOMEPRAZOLE MAGNESIUM 40 MG PO CPDR: 40.0000 mg | DELAYED_RELEASE_CAPSULE | Freq: Every day | ORAL | Status: DC

## 2012-12-12 MED ORDER — METHOCARBAMOL 500 MG PO TABS: 500.0000 mg | ORAL_TABLET | Freq: Four times a day (QID) | ORAL | Status: DC

## 2012-12-12 NOTE — Progress Notes (Signed)
  Subjective:    Patient ID: Anthony [name], male    DOB: [birth year], 62 y.o.   MRN: [id number]  HPI For the last month, he has had left great toenail sore and leaking fluid.  Has tried to reduce calories for the last several months.   Anesthesiologist, Dr. Selena Batten, at [facility] said he heard a murmur.   Told him he has a murmur.  Thinking about joining a Gym. Still with pain in the left thigh laterally. Dr. Shelle Iron suspects a bursitis.  Heart doing OK.    Review of Systems  Constitutional: Negative for fever, chills, diaphoresis, activity change and appetite change.  HENT: Negative for hearing loss, ear pain, congestion and neck pain.   Eyes:       Rx lenses  Respiratory: Negative for apnea, cough, choking, chest tightness, shortness of breath and wheezing.   Cardiovascular: Negative for chest pain, palpitations and leg swelling.  Gastrointestinal: Negative.   Endocrine: Negative.   Genitourinary: Negative.   Musculoskeletal:       Danielle Dess in the left leg and thigh  Skin:       Subungual infection of theleft  great toe and associated onycholysis.  Allergic/Immunologic: Negative.   Neurological: Negative.   Hematological: Negative.   Psychiatric/Behavioral: Negative.        Objective:   Physical Exam  Constitutional: He is oriented to person, place, and time. No distress.  obese  HENT:  Head: Normocephalic and atraumatic.  Nose: Nose normal.  Mouth/Throat: Oropharynx is clear and moist.  Loss of hearing and tinnitus reported bilaterally.  Eyes:  Rx lenses  Neck: No tracheal deviation present. No thyromegaly present.  Cardiovascular: Normal rate, regular rhythm, normal heart sounds and intact distal pulses.  Exam reveals no gallop and no friction rub.   No murmur heard. Pulmonary/Chest: No respiratory distress. He has no wheezes. He has no rales. He exhibits no tenderness.  Abdominal: He exhibits no distension and no mass. There is no tenderness.  Musculoskeletal: Normal range of  motion. He exhibits no edema and no tenderness.  Lymphadenopathy:    He has no cervical adenopathy.  Neurological: He is alert and oriented to person, place, and time. No cranial nerve deficit. Coordination normal.  Skin: No rash noted. He is not diaphoretic. No erythema. No pallor.  Subungual infection of the left great toe with onycholysis.  Psychiatric: He has a normal mood and affect. His behavior is normal. Judgment and thought content normal.     Lab reports 11/05/2010  [facility]) CBC: wbc 7.5, rbc 4.88, hemoglobin 13.8 CMP: glucose 97, BUN 12, Creatinine 0.77  Lipid: Cholesterol 222, Triglycerides 169, HDL 43, LDL 145 TSH: 2.255   11/16/10 EKG: Rate 77. Normal sinus rhythm. Normal EKG. 03/02/2012  CMP: glucose 97, BUN 19, Creatinine 0.90 Lipid: Cholesterol 179, Triglycerides 203, HDL 36, LDL 102 PSA: 6.5 TSH: 2.710 05/28/2012  Creatine Kinase 100, CK 3.2 Lipid: Cholesterol 156, Triglycerides 125, HDL 38, LDL 93 PSA: 3.4 05/30/12 EKG:  Rate 60. Normal sinus rhythm. Normal EKG.     Assessment & Plan:  Subungual cellulitis of toe, left  Started antibiotic. Continue soaks. May need debridement of overlying nail Other and unspecified hyperlipidemia - Plan: Lipid Panel  Needs lab repeated Hypertension - Plan: CMP  controlled

## 2012-12-12 NOTE — Patient Instructions (Signed)
Take antibiotic and return in 2 weeks.

## 2012-12-13 LAB — COMPREHENSIVE METABOLIC PANEL
ALT: 12 IU/L (ref 0–44)
AST: 22 IU/L (ref 0–40)
Albumin/Globulin Ratio: 1.6 (ref 1.1–2.5)
Albumin: 4.3 g/dL (ref 3.6–4.8)
Alkaline Phosphatase: 51 IU/L (ref 39–117)
BUN/Creatinine Ratio: 13 (ref 10–22)
BUN: 10 mg/dL (ref 8–27)
CO2: 25 mmol/L (ref 19–28)
Calcium: 9.8 mg/dL (ref 8.6–10.2)
Chloride: 102 mmol/L (ref 97–108)
Creatinine, Ser: 0.77 mg/dL (ref 0.76–1.27)
GFR calc Af Amer: 112 mL/min/{1.73_m2} (ref >59)
GFR calc non Af Amer: 97 mL/min/{1.73_m2} (ref >59)
Globulin, Total: 2.7 g/dL (ref 1.5–4.5)
Glucose: 64 mg/dL — ABNORMAL LOW (ref 65–99)
Potassium: 4.9 mmol/L (ref 3.5–5.2)
Sodium: 139 mmol/L (ref 134–144)
Total Bilirubin: 0.3 mg/dL (ref 0.0–1.2)
Total Protein: 7 g/dL (ref 6.0–8.5)

## 2012-12-13 LAB — LIPID PANEL
Chol/HDL Ratio: 5.2 ratio units — ABNORMAL HIGH (ref 0.0–5.0)
VLDL Cholesterol Cal: 19 mg/dL (ref 5–40)

## 2012-12-21 ENCOUNTER — Telehealth: Payer: Self-pay | Admitting: *Deleted

## 2012-12-21 NOTE — Telephone Encounter (Signed)
Anthony [name] called and stated that she thinks her husband needs a refill on his antibiotics.   [facility]/ [facility]

## 2012-12-24 ENCOUNTER — Other Ambulatory Visit: Payer: Self-pay | Admitting: Internal Medicine

## 2012-12-24 DIAGNOSIS — L0390 Cellulitis, unspecified: Secondary | ICD-10-CM

## 2012-12-24 DIAGNOSIS — L03032 Cellulitis of left toe: Secondary | ICD-10-CM

## 2012-12-24 MED ORDER — AMOXICILLIN-POT CLAVULANATE 875-125 MG PO TABS: 1.0000 | ORAL_TABLET | Freq: Two times a day (BID) | ORAL | Status: DC

## 2012-12-25 DIAGNOSIS — H33059 Total retinal detachment, unspecified eye: Secondary | ICD-10-CM | POA: Insufficient documentation

## 2012-12-31 ENCOUNTER — Encounter: Payer: Self-pay | Admitting: *Deleted

## 2013-01-01 ENCOUNTER — Encounter: Payer: Self-pay | Admitting: Internal Medicine

## 2013-01-01 ENCOUNTER — Encounter: Payer: Self-pay | Admitting: *Deleted

## 2013-01-01 ENCOUNTER — Ambulatory Visit (INDEPENDENT_AMBULATORY_CARE_PROVIDER_SITE_OTHER): Payer: Medicare Other | Admitting: Internal Medicine

## 2013-01-01 VITALS — BP 118/60 | HR 68 | Temp 96.4°F | Resp 16 | Ht 69.5 in | Wt 252.8 lb

## 2013-01-01 DIAGNOSIS — I10 Essential (primary) hypertension: Secondary | ICD-10-CM

## 2013-01-01 DIAGNOSIS — E785 Hyperlipidemia, unspecified: Secondary | ICD-10-CM

## 2013-01-01 DIAGNOSIS — L03032 Cellulitis of left toe: Secondary | ICD-10-CM

## 2013-01-01 DIAGNOSIS — M542 Cervicalgia: Secondary | ICD-10-CM | POA: Insufficient documentation

## 2013-01-01 DIAGNOSIS — L02619 Cutaneous abscess of unspecified foot: Secondary | ICD-10-CM

## 2013-01-01 NOTE — Telephone Encounter (Signed)
Patient came in for office visit on 05/ 13/2014

## 2013-01-01 NOTE — Patient Instructions (Signed)
Soak toe daily for 15-20 min. Continue current medications.
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
## 2013-01-15 ENCOUNTER — Other Ambulatory Visit: Payer: Self-pay | Admitting: Internal Medicine

## 2013-01-23 ENCOUNTER — Ambulatory Visit: Payer: Medicare Other | Admitting: Internal Medicine

## 2013-03-06 ENCOUNTER — Ambulatory Visit (INDEPENDENT_AMBULATORY_CARE_PROVIDER_SITE_OTHER): Payer: Medicare Other | Admitting: Internal Medicine

## 2013-03-06 VITALS — BP 138/72 | HR 78 | Temp 98.8°F | Resp 16 | Ht 69.5 in | Wt 259.4 lb

## 2013-03-06 DIAGNOSIS — L03032 Cellulitis of left toe: Secondary | ICD-10-CM

## 2013-03-06 DIAGNOSIS — I10 Essential (primary) hypertension: Secondary | ICD-10-CM

## 2013-03-06 DIAGNOSIS — E785 Hyperlipidemia, unspecified: Secondary | ICD-10-CM

## 2013-03-06 DIAGNOSIS — L02619 Cutaneous abscess of unspecified foot: Secondary | ICD-10-CM

## 2013-03-06 DIAGNOSIS — M549 Dorsalgia, unspecified: Secondary | ICD-10-CM | POA: Insufficient documentation

## 2013-03-06 DIAGNOSIS — E669 Obesity, unspecified: Secondary | ICD-10-CM

## 2013-03-06 MED ORDER — AMLODIPINE BESYLATE 10 MG PO TABS: 10.0000 mg | ORAL_TABLET | Freq: Every day | ORAL | Status: DC

## 2013-03-06 MED ORDER — AMLODIPINE BESYLATE 10 MG PO TABS: ORAL_TABLET | ORAL | Status: DC

## 2013-03-06 NOTE — Progress Notes (Signed)
Subjective:    Patient ID: Anthony [name], male    DOB: 1951/07/06, 62 y.o.   MRN: [id number]  HPI Left great toe is doing better. He had a previous infection sublingually. The nail was debrided at his last visit. The area is now healing. There is no pain.  Had a pain in the right chest posteriorly and between his shoulder blades yesterday, but none today. Patient reports this to be throughout, but does not feel it needs further evaluation. It has not been associated with any substernal chest pain.  Persistent dry cough. Some reflux. Using Nexium daily. Robitussin helped. Current Outpatient Prescriptions on File Prior to Visit  Medication Sig Dispense Refill  . aspirin 81 MG tablet Take 81 mg by mouth daily. Take one tablet once daily for heart health.      . benazepril (LOTENSIN) 20 MG tablet Take 1 tablet (20 mg total) by mouth daily. For blood pressure  90 tablet  3  . Dorzolamide HCl-Timolol Mal PF 22.3-6.8 MG/ML SOLN Apply to eye. One drop twice daily into right eye.      . esomeprazole (NEXIUM) 40 MG capsule Take 1 capsule (40 mg total) by mouth daily before breakfast. For reflux  90 capsule  3  . ezetimibe (ZETIA) 10 MG tablet Take 1 tablet (10 mg total) by mouth daily. For cholesterol  90 tablet  3  . finasteride (PROSCAR) 5 MG tablet TAKE 1 TABLET BY MOUTH DAILY TO HELP PROSTRATE  30 tablet  5  . hydrochlorothiazide (HYDRODIURIL) 25 MG tablet Take 1 tablet (25 mg total) by mouth daily. For blood pressure  90 tablet  3  . latanoprost (XALATAN) 0.005 % ophthalmic solution 1 drop at bedtime. One drop once daily into the right eye.      . meloxicam (MOBIC) 7.5 MG tablet Take 7.5 mg by mouth daily.       . methocarbamol (ROBAXIN) 500 MG tablet Take 1 tablet (500 mg total) by mouth every 6 (six) hours. For pain  360 tablet  3  . Multiple Vitamins-Minerals (MULTIVITAMIN WITH MINERALS) tablet Take 1 tablet by mouth daily.            Review of Systems  Constitutional: Negative for  activity change and appetite change.       Despite much encouragement, he has not been successful in weight loss.  Eyes: Negative.   Respiratory: Positive for cough.        Persistent dry cough. Responds to Mucinex DM and does not seem to get any worse at meals or swallowing liquids.  Cardiovascular: Negative for chest pain, palpitations and leg swelling.  Gastrointestinal: Negative for nausea, diarrhea, constipation, blood in stool and abdominal distention.  Endocrine: Negative.   Genitourinary: Negative.   Musculoskeletal: Positive for back pain and arthralgias.       Low back discomfort. Hip pains. Neck pains.  Neurological: Positive for headaches.  Hematological: Negative.   Psychiatric/Behavioral: Negative.        Objective:BP 138/72  Pulse 78  Temp(Src) 98.8 F (37.1 C) (Oral)  Resp 16  Ht 5' 9.5" (1.765 m)  Wt 259 lb 6.4 oz (117.663 kg)  BMI 37.77 kg/m2  SpO2 96%    Physical Exam  Constitutional: He is oriented to person, place, and time.  Obese middle-aged male.  HENT:  Head: Normocephalic and atraumatic.  Left Ear: External ear normal.  Nose: Nose normal.  Eyes: Conjunctivae and EOM are normal. Pupils are equal, round, and reactive to light.  Neck: Normal range of motion. No JVD present. No tracheal deviation present. No thyromegaly present.  Moderate stiffness of the neck.  Cardiovascular: Normal rate, regular rhythm, normal heart sounds and intact distal pulses.  Exam reveals no gallop and no friction rub.   No murmur heard. Prescription lenses  Pulmonary/Chest: No respiratory distress. He has no wheezes. He has no rales. He exhibits no tenderness.  Abdominal: Soft. Bowel sounds are normal. He exhibits no distension and no mass. There is no tenderness.  Musculoskeletal: Normal range of motion. He exhibits no edema and no tenderness.  Pain in lower back and hips with movement. Pain in the back with flexion, extension, and rotational movements of the head.   Lymphadenopathy:    He has no cervical adenopathy.  Neurological: He is alert and oriented to person, place, and time. No cranial nerve deficit. Coordination normal.  Skin: Skin is warm and dry. No rash noted. No erythema. No pallor.  Subungual cellulitis of the left great toe has improved.  Psychiatric: He has a normal mood and affect. His behavior is normal. Judgment and thought content normal.     Office Visit on 12/12/2012  Component Date Value Range Status  . Glucose 12/12/2012 64* 65 - 99 mg/dL Final  . BUN 47/82/9562 10  8 - 27 mg/dL Final  . Creatinine, Ser 12/12/2012 0.77  0.76 - 1.27 mg/dL Final  . GFR calc non Af Amer 12/12/2012 97  >59 mL/min/1.73 Final  . GFR calc Af Amer 12/12/2012 112  >59 mL/min/1.73 Final  . BUN/Creatinine Ratio 12/12/2012 13  10 - 22 Final  . Sodium 12/12/2012 139  134 - 144 mmol/L Final  . Potassium 12/12/2012 4.9  3.5 - 5.2 mmol/L Final  . Chloride 12/12/2012 102  97 - 108 mmol/L Final  . CO2 12/12/2012 25  19 - 28 mmol/L Final  . Calcium 12/12/2012 9.8  8.6 - 10.2 mg/dL Final  . Total Protein 12/12/2012 7.0  6.0 - 8.5 g/dL Final  . Albumin 13/03/6577 4.3  3.6 - 4.8 g/dL Final  . Globulin, Total 12/12/2012 2.7  1.5 - 4.5 g/dL Final  . Albumin/Globulin Ratio 12/12/2012 1.6  1.1 - 2.5 Final  . Total Bilirubin 12/12/2012 0.3  0.0 - 1.2 mg/dL Final  . Alkaline Phosphatase 12/12/2012 51  39 - 117 IU/L Final  . AST 12/12/2012 22  0 - 40 IU/L Final  . ALT 12/12/2012 12  0 - 44 IU/L Final  . Cholesterol, Total 12/12/2012 241* 100 - 199 mg/dL Final  . Triglycerides 12/12/2012 97  0 - 149 mg/dL Final  . HDL 46/96/2952 46  >39 mg/dL Final   Comment: According to ATP-III Guidelines, HDL-C >59 mg/dL is considered a                          negative risk factor for CHD.  Marland Kitchen VLDL Cholesterol Cal 12/12/2012 19  5 - 40 mg/dL Final  . LDL Calculated 12/12/2012 841* 0 - 99 mg/dL Final  . Chol/HDL Ratio 12/12/2012 5.2* 0.0 - 5.0 ratio units Final       Assessment & Plan:  Unspecified essential hypertension: Controlled at this time. He would like to try cutting back on some of his medications. I recommended he go to the half tablet of the amlodipine 10 mg. Other drugs will remain the same.  Subungual cellulitis of toe, left: Improved  Other and unspecified hyperlipidemia: Recheck prior to next visit  Obesity, unspecified: Encouraged  to lose weight. I suggested stopping potatoes, meds, as does. Concentrate on fruits and vegetables and reduced meats to no more than 3 ounces per meal.  Pain, upper back. Uncertain significance. It has completely gone away. No further action at this time.

## 2013-03-06 NOTE — Patient Instructions (Signed)
Avoid bread, potatoe, pastas. Concentrate onvegetables, fruits, and 3 oz or less of meat per meal.

## 2013-06-04 ENCOUNTER — Encounter: Payer: Self-pay | Admitting: Internal Medicine

## 2013-06-05 ENCOUNTER — Telehealth: Payer: Self-pay | Admitting: Internal Medicine

## 2013-06-05 DIAGNOSIS — E785 Hyperlipidemia, unspecified: Secondary | ICD-10-CM

## 2013-06-05 DIAGNOSIS — T887XXA Unspecified adverse effect of drug or medicament, initial encounter: Secondary | ICD-10-CM

## 2013-06-05 DIAGNOSIS — I10 Essential (primary) hypertension: Secondary | ICD-10-CM

## 2013-06-05 NOTE — Telephone Encounter (Signed)
Left message on voicemail for patient to call and schedule a fasting lab appointment

## 2013-06-05 NOTE — Telephone Encounter (Signed)
He needs BMP and Lipids.

## 2013-06-10 ENCOUNTER — Other Ambulatory Visit: Payer: Medicare Other

## 2013-06-10 DIAGNOSIS — T887XXA Unspecified adverse effect of drug or medicament, initial encounter: Secondary | ICD-10-CM

## 2013-06-10 DIAGNOSIS — I10 Essential (primary) hypertension: Secondary | ICD-10-CM

## 2013-06-10 DIAGNOSIS — E785 Hyperlipidemia, unspecified: Secondary | ICD-10-CM

## 2013-06-11 ENCOUNTER — Ambulatory Visit (INDEPENDENT_AMBULATORY_CARE_PROVIDER_SITE_OTHER): Payer: Medicare Other | Admitting: Internal Medicine

## 2013-06-11 ENCOUNTER — Encounter: Payer: Self-pay | Admitting: Internal Medicine

## 2013-06-11 VITALS — BP 144/82 | HR 67 | Temp 98.0°F | Wt 252.6 lb

## 2013-06-11 DIAGNOSIS — N529 Male erectile dysfunction, unspecified: Secondary | ICD-10-CM

## 2013-06-11 DIAGNOSIS — N400 Enlarged prostate without lower urinary tract symptoms: Secondary | ICD-10-CM | POA: Insufficient documentation

## 2013-06-11 DIAGNOSIS — I10 Essential (primary) hypertension: Secondary | ICD-10-CM

## 2013-06-11 DIAGNOSIS — E669 Obesity, unspecified: Secondary | ICD-10-CM

## 2013-06-11 DIAGNOSIS — N138 Other obstructive and reflux uropathy: Secondary | ICD-10-CM

## 2013-06-11 DIAGNOSIS — K219 Gastro-esophageal reflux disease without esophagitis: Secondary | ICD-10-CM

## 2013-06-11 DIAGNOSIS — Z23 Encounter for immunization: Secondary | ICD-10-CM

## 2013-06-11 DIAGNOSIS — H409 Unspecified glaucoma: Secondary | ICD-10-CM

## 2013-06-11 DIAGNOSIS — L03032 Cellulitis of left toe: Secondary | ICD-10-CM

## 2013-06-11 DIAGNOSIS — E785 Hyperlipidemia, unspecified: Secondary | ICD-10-CM

## 2013-06-11 DIAGNOSIS — R252 Cramp and spasm: Secondary | ICD-10-CM

## 2013-06-11 DIAGNOSIS — R972 Elevated prostate specific antigen [PSA]: Secondary | ICD-10-CM | POA: Insufficient documentation

## 2013-06-11 DIAGNOSIS — L02619 Cutaneous abscess of unspecified foot: Secondary | ICD-10-CM

## 2013-06-11 DIAGNOSIS — N401 Enlarged prostate with lower urinary tract symptoms: Secondary | ICD-10-CM

## 2013-06-11 LAB — LIPID PANEL
Chol/HDL Ratio: 5.8 ratio units — ABNORMAL HIGH (ref 0.0–5.0)
Cholesterol, Total: 196 mg/dL (ref 100–199)
HDL: 34 mg/dL — ABNORMAL LOW (ref >39)

## 2013-06-11 LAB — BASIC METABOLIC PANEL
Calcium: 9.4 mg/dL (ref 8.6–10.2)
GFR calc non Af Amer: 95 mL/min/{1.73_m2} (ref >59)
Glucose: 95 mg/dL (ref 65–99)
Potassium: 4.1 mmol/L (ref 3.5–5.2)

## 2013-06-11 MED ORDER — TADALAFIL 5 MG PO TABS: ORAL_TABLET | ORAL | Status: DC

## 2013-06-11 NOTE — Patient Instructions (Signed)
Continue current medication.  Add Cialis to help prostate.

## 2013-06-11 NOTE — Progress Notes (Signed)
Subjective:    Patient ID: Anthony [name], male    DOB: August 03, 1951, 62 y.o.   MRN: [id number]  Chief Complaint  Patient presents with  . Medical Managment of Chronic Issues    3 month follow up    HPI Hypertension : Adequately controlled  Other and unspecified hyperlipidemia : Moderate elevation in triglycerides. LDL satisfactorily controlled  Obesity, unspecified: Continues to need to lose weight.  Glaucoma: No recent changes  Elevated prostate specific antigen (PSA): Continue to follow routinely  Hypertrophy of prostate with urinary obstruction and other lower urinary tract symptoms (LUTS): Mild obstructive symptoms  Impotence of organic origin: Requesting medication to help maintain erections  Cramp of limb: Nocturnal muscle cramps in the legs  GERD (gastroesophageal reflux disease): Occasional heartburn  Subungual cellulitis of toe, left: Resolved  Need for prophylactic vaccination and inoculation against influenza  Current Outpatient Prescriptions on File Prior to Visit  Medication Sig Dispense Refill  . amLODipine (NORVASC) 10 MG tablet One half tablet daily to control blood pressure.  90 tablet  3  . aspirin 81 MG tablet Take 81 mg by mouth daily. Take one tablet once daily for heart health.      . benazepril (LOTENSIN) 20 MG tablet Take 1 tablet (20 mg total) by mouth daily. For blood pressure  90 tablet  3  . Dorzolamide HCl-Timolol Mal PF 22.3-6.8 MG/ML SOLN Apply to eye. One drop twice daily into right eye.      . esomeprazole (NEXIUM) 40 MG capsule Take 1 capsule (40 mg total) by mouth daily before breakfast. For reflux  90 capsule  3  . ezetimibe (ZETIA) 10 MG tablet Take 1 tablet (10 mg total) by mouth daily. For cholesterol  90 tablet  3  . finasteride (PROSCAR) 5 MG tablet TAKE 1 TABLET BY MOUTH DAILY TO HELP PROSTRATE  30 tablet  5  . hydrochlorothiazide (HYDRODIURIL) 25 MG tablet Take 1 tablet (25 mg total) by mouth daily. For blood pressure  90 tablet  3   . latanoprost (XALATAN) 0.005 % ophthalmic solution 1 drop at bedtime. One drop once daily into the right eye.      . meloxicam (MOBIC) 7.5 MG tablet Take 7.5 mg by mouth daily.       . methocarbamol (ROBAXIN) 500 MG tablet Take 1 tablet (500 mg total) by mouth every 6 (six) hours. For pain  360 tablet  3  . Multiple Vitamins-Minerals (MULTIVITAMIN WITH MINERALS) tablet Take 1 tablet by mouth daily.       No current facility-administered medications on file prior to visit.    Review of Systems  Constitutional: Negative for activity change and appetite change.       Despite much encouragement, he has not been successful in weight loss.  Eyes: Negative.   Respiratory: Positive for cough.        Persistent dry cough. Responds to Mucinex DM and does not seem to get any worse at meals or swallowing liquids.  Cardiovascular: Negative for chest pain, palpitations and leg swelling.  Gastrointestinal: Negative for nausea, diarrhea, constipation, blood in stool and abdominal distention.  Endocrine: Negative.   Genitourinary: Negative.   Musculoskeletal: Positive for arthralgias and back pain.       Low back discomfort. Hip pains. Neck pains.  Neurological: Positive for headaches.  Hematological: Negative.   Psychiatric/Behavioral: Negative.        Objective:BP 144/82  Pulse 67  Temp(Src) 98 F (36.7 C) (Oral)  Wt 252 lb 9.6  oz (114.579 kg)  BMI 36.78 kg/m2  SpO2 97%    Physical Exam  Constitutional: He is oriented to person, place, and time.  Obese middle-aged male.  HENT:  Head: Normocephalic and atraumatic.  Left Ear: External ear normal.  Nose: Nose normal.  Eyes: Conjunctivae and EOM are normal. Pupils are equal, round, and reactive to light.  Neck: Normal range of motion. No JVD present. No tracheal deviation present. No thyromegaly present.  Moderate stiffness of the neck.  Cardiovascular: Normal rate, regular rhythm, normal heart sounds and intact distal pulses.  Exam  reveals no gallop and no friction rub.   No murmur heard. Prescription lenses  Pulmonary/Chest: No respiratory distress. He has no wheezes. He has no rales. He exhibits no tenderness.  Abdominal: Soft. Bowel sounds are normal. He exhibits no distension and no mass. There is no tenderness.  Musculoskeletal: Normal range of motion. He exhibits no edema and no tenderness.  Pain in lower back and hips with movement. Pain in the back with flexion, extension, and rotational movements of the head.  Lymphadenopathy:    He has no cervical adenopathy.  Neurological: He is alert and oriented to person, place, and time. No cranial nerve deficit. Coordination normal.  Skin: Skin is warm and dry. No rash noted. No erythema. No pallor.  Subungual cellulitis of the left great toe has improved.  Psychiatric: He has a normal mood and affect. His behavior is normal. Judgment and thought content normal.      Appointment on 06/10/2013  Component Date Value Range Status  . Cholesterol, Total 06/10/2013 196  100 - 199 mg/dL Final  . Triglycerides 06/10/2013 291* 0 - 149 mg/dL Final  . HDL 40/98/1191 34* >39 mg/dL Final   Comment: According to ATP-III Guidelines, HDL-C >59 mg/dL is considered a                          negative risk factor for CHD.  Marland Kitchen VLDL Cholesterol Cal 06/10/2013 58* 5 - 40 mg/dL Final  . LDL Calculated 06/10/2013 478* 0 - 99 mg/dL Final  . Chol/HDL Ratio 06/10/2013 5.8* 0.0 - 5.0 ratio units Final   Comment:                                   T. Chol/HDL Ratio                                                                      Men  Women                                                        1/2 Avg.Risk  3.4    3.3  Avg.Risk  5.0    4.4                                                         2X Avg.Risk  9.6    7.1                                                         3X Avg.Risk 23.4   11.0  . Glucose 06/10/2013 95  65 - 99  mg/dL Final  . BUN 16/05/9603 15  8 - 27 mg/dL Final  . Creatinine, Ser 06/10/2013 0.79  0.76 - 1.27 mg/dL Final  . GFR calc non Af Amer 06/10/2013 95  >59 mL/min/1.73 Final  . GFR calc Af Amer 06/10/2013 110  >59 mL/min/1.73 Final  . BUN/Creatinine Ratio 06/10/2013 19  10 - 22 Final  . Sodium 06/10/2013 144  134 - 144 mmol/L Final  . Potassium 06/10/2013 4.1  3.5 - 5.2 mmol/L Final  . Chloride 06/10/2013 103  97 - 108 mmol/L Final  . CO2 06/10/2013 27  18 - 29 mmol/L Final  . Calcium 06/10/2013 9.4  8.6 - 10.2 mg/dL Final       Assessment & Plan:  1. Need for prophylactic vaccination and inoculation against influenza Administered  2. Other and unspecified hyperlipidemia Satisfactory control except for mild elevation to moderate elevation in his triglycerides. - Lipid panel; Future  3. Obesity, unspecified Continue to encourage weight loss. Discussed diet at length.  4. Hypertension Controlled - Comprehensive metabolic panel; Future  5. Glaucoma No recent change  6. Elevated prostate specific antigen (PSA) Continue to monitor  7. Hypertrophy of prostate with urinary obstruction and other lower urinary tract symptoms (LUTS) Unchanged symptoms. Added Cialis.  8. Impotence of organic origin Added Cialis.  9. Cramp of limb He does not feel this is severe enough to require treatment  10. GERD (gastroesophageal reflux disease) Mild symptoms. Continue current medications  11. Subungual cellulitis of toe, left Resolved

## 2013-06-14 ENCOUNTER — Other Ambulatory Visit: Payer: Self-pay | Admitting: *Deleted

## 2013-06-14 ENCOUNTER — Encounter: Payer: Self-pay | Admitting: Internal Medicine

## 2013-06-14 DIAGNOSIS — N401 Enlarged prostate with lower urinary tract symptoms: Secondary | ICD-10-CM

## 2013-06-14 DIAGNOSIS — N138 Other obstructive and reflux uropathy: Secondary | ICD-10-CM

## 2013-06-14 MED ORDER — TADALAFIL 5 MG PO TABS: ORAL_TABLET | ORAL | Status: DC

## 2013-06-14 NOTE — Telephone Encounter (Signed)
rx needed to be resent

## 2013-06-19 ENCOUNTER — Telehealth: Payer: Self-pay | Admitting: *Deleted

## 2013-06-27 ENCOUNTER — Other Ambulatory Visit: Payer: Self-pay | Admitting: *Deleted

## 2013-06-27 MED ORDER — ESOMEPRAZOLE MAGNESIUM 40 MG PO CPDR: 40.0000 mg | DELAYED_RELEASE_CAPSULE | Freq: Every day | ORAL | Status: DC

## 2013-06-28 ENCOUNTER — Ambulatory Visit: Payer: Managed Care, Other (non HMO) | Admitting: Cardiovascular Disease

## 2013-07-01 ENCOUNTER — Ambulatory Visit: Payer: Managed Care, Other (non HMO) | Admitting: Cardiovascular Disease

## 2013-07-04 ENCOUNTER — Encounter: Payer: Self-pay | Admitting: Cardiovascular Disease

## 2013-07-04 ENCOUNTER — Ambulatory Visit (INDEPENDENT_AMBULATORY_CARE_PROVIDER_SITE_OTHER): Payer: BC Managed Care – PPO | Admitting: Cardiovascular Disease

## 2013-07-04 VITALS — BP 150/90 | HR 69 | Ht 70.0 in | Wt 252.1 lb

## 2013-07-04 DIAGNOSIS — E785 Hyperlipidemia, unspecified: Secondary | ICD-10-CM

## 2013-07-04 DIAGNOSIS — E669 Obesity, unspecified: Secondary | ICD-10-CM

## 2013-07-04 DIAGNOSIS — I10 Essential (primary) hypertension: Secondary | ICD-10-CM

## 2013-07-04 MED ORDER — BENAZEPRIL HCL 40 MG PO TABS: 40.0000 mg | ORAL_TABLET | Freq: Every day | ORAL | Status: DC

## 2013-07-04 NOTE — Patient Instructions (Signed)
Your physician recommends that you return for lab work in: 2 WEEKS fasting.  Your physician has recommended you make the following change in your medication: increase the lotensin to 40 mg daily. Take 2 of the 20 mg tablets until finished, then start the new prescription given today for the 40 mg tablets.  Your physician recommends that you schedule a follow-up appointment in: 3 MONTHS.

## 2013-07-22 LAB — COMPREHENSIVE METABOLIC PANEL
ALT: 23 U/L (ref 0–53)
Albumin: 4.4 g/dL (ref 3.5–5.2)
Alkaline Phosphatase: 71 U/L (ref 39–117)
BUN: 15 mg/dL (ref 6–23)
Calcium: 9.5 mg/dL (ref 8.4–10.5)
Chloride: 105 mEq/L (ref 96–112)
Creat: 0.87 mg/dL (ref 0.50–1.35)
Glucose, Bld: 96 mg/dL (ref 70–99)
Potassium: 4.1 mEq/L (ref 3.5–5.3)

## 2013-07-23 ENCOUNTER — Encounter: Payer: Self-pay | Admitting: Internal Medicine

## 2013-07-23 LAB — NMR LIPOPROFILE WITH LIPIDS
HDL Particle Number: 36.5 umol/L (ref >=30.5)
HDL Size: 9.5 nm (ref >=9.2)
LDL Particle Number: 1362 nmol/L — ABNORMAL HIGH (ref <1000)
Large VLDL-P: 6.7 nmol/L — ABNORMAL HIGH (ref <=2.7)
Small LDL Particle Number: 1142 nmol/L — ABNORMAL HIGH (ref <=527)
Triglycerides: 253 mg/dL — ABNORMAL HIGH (ref <150)
VLDL Size: 44.5 nm (ref <=46.6)

## 2013-07-25 ENCOUNTER — Encounter: Payer: Self-pay | Admitting: Cardiovascular Disease

## 2013-07-25 DIAGNOSIS — Z6837 Body mass index (BMI) 37.0-37.9, adult: Secondary | ICD-10-CM | POA: Insufficient documentation

## 2013-07-25 DIAGNOSIS — E782 Mixed hyperlipidemia: Secondary | ICD-10-CM | POA: Insufficient documentation

## 2013-07-25 NOTE — Progress Notes (Signed)
Patient ID: Anthony [name], male   DOB: [birth year], 62 y.o.   MRN: [id number]     HPI: Anthony [name] is a 62 y.o. male who presents to the office for one year cardiology evaluation.  Mr. Bloodworth has a history of hypertension, mild obesity, as well as hyperlipidemia. Her mildly, he did develop CPK elevation secondary to statin therapy. He has been able to tolerate Zetia. A nuclear perfusion study done in October 2013 for chest pain showed normal perfusion without scar or ischemia.  Over the past year, he has continued to do well. He denies any recurrent chest pain episodes. He denies significant shortness of breath. He recently had had laboratory in October which showed a portal cholesterol 196 triglycerides significantly elevated at 291 HDL of 34 VLDL increased at 58 LDL 106. Renal function was normal. BUN 15 kerning 0.79. Laboratory had been done by Dr. Chilton Si. He presents now for one year cardiology evaluation.  Past Medical History  Diagnosis Date  . Lumbago   . Chest pain, unspecified   . Elevated prostate specific antigen (PSA)   . Benign neoplasm of skin of upper limb, including shoulder   . Unspecified tinnitus   . Other seborrheic keratosis   . Osteoarthrosis, unspecified whether generalized or localized, lower leg   . Hypertrophy of prostate with urinary obstruction and other lower urinary tract symptoms (LUTS)   . Herpes zoster without mention of complication   . Carpal tunnel syndrome   . Impotence of organic origin   . Benign neoplasm of colon   . Encounter for long-term (current) use of other medications   . Other and unspecified hyperlipidemia   . Hypertension   . Special screening for malignant neoplasm of prostate   . Diaphragmatic hernia without mention of obstruction or gangrene   . Cramp of limb   . GERD (gastroesophageal reflux disease)   . Obesity, unspecified   . Other malaise and fatigue   . Subungual cellulitis of toe   . Hypertension   . Dyslipidemia      Past Surgical History  Procedure Laterality Date  . Inguinal hernia repair  1984    left  . Cholecystectomy  1992  . Eye surgery    . Retinal detachment surgery  2001    right  . Eyeband  2006    removal of right eyeband  . Intraocular lens removal  2008    right  . Total knee arthroplasty  2011, 2012    bilateral  . Total hip arthroplasty  2012    bilateral  . Lower venous extremity doppler  01/26/2011    No evidence of thrombus or thrombophlebitis.  . Cardiovascular stress test  06/15/2012    Non-diagnostic for ischemia. No lexiscan EKG changes.  . Transthoracic echocardiogram  10/05/2009    EF >55%, normal LV size, systolic, and diastolic function.    Allergies  Allergen Reactions  . Codeine   . Lipitor [Atorvastatin]   . Red Yeast Rice [Cholestin]     Current Outpatient Prescriptions  Medication Sig Dispense Refill  . amLODipine (NORVASC) 10 MG tablet One half tablet daily to control blood pressure.  90 tablet  3  . aspirin 81 MG tablet Take 81 mg by mouth daily. Take one tablet once daily for heart health.      . Dorzolamide HCl-Timolol Mal PF 22.3-6.8 MG/ML SOLN Apply to eye. One drop twice daily into right eye.      . esomeprazole (NEXIUM) 40 MG capsule  Take 1 capsule (40 mg total) by mouth daily before breakfast. For reflux  90 capsule  3  . ezetimibe (ZETIA) 10 MG tablet Take 1 tablet (10 mg total) by mouth daily. For cholesterol  90 tablet  3  . finasteride (PROSCAR) 5 MG tablet TAKE 1 TABLET BY MOUTH DAILY TO HELP PROSTRATE  30 tablet  5  . hydrochlorothiazide (HYDRODIURIL) 25 MG tablet Take 1 tablet (25 mg total) by mouth daily. For blood pressure  90 tablet  3  . latanoprost (XALATAN) 0.005 % ophthalmic solution 1 drop at bedtime. One drop once daily into the right eye.      . methocarbamol (ROBAXIN) 500 MG tablet Take 1 tablet (500 mg total) by mouth every 6 (six) hours. For pain  360 tablet  3  . Multiple Vitamins-Minerals (MULTIVITAMIN WITH MINERALS)  tablet Take 1 tablet by mouth daily.      . tadalafil (CIALIS) 5 MG tablet One daily to help BPH  30 tablet  5  . benazepril (LOTENSIN) 40 MG tablet Take 1 tablet (40 mg total) by mouth daily.  30 tablet  6   No current facility-administered medications for this visit.    History   Social History  . Marital Status: Married    Spouse Name: N/A    Number of Children: N/A  . Years of Education: N/A   Occupational History  . Not on file.   Social History Main Topics  . Smoking status: Never Smoker   . Smokeless tobacco: Not on file  . Alcohol Use: No  . Drug Use: No  . Sexual Activity: Yes    Partners: Female     Comment: wife   Other Topics Concern  . Not on file   Social History Narrative  . No narrative on file   Social is normal that he is married has 2 children and 2 grandchildren. He is not routinely exercise. There is no tobacco use. He does drink occasional alcohol.  Family History  Problem Relation Age of Onset  . Diabetes Mother   . Arrhythmia Mother   . Hypertension Mother   . Dementia Father   . Pulmonary embolism Father   . Hypertension Father   . Cancer Sister     Liver cancer - Histocytic lymphoma  . Cancer Sister 64    Skin cancer  . Hypertension Sister 34  . Diabetes Sister   . Stroke Brother   . Diabetes Brother   . Hypertension Brother   . Cancer Daughter   . Cancer Maternal Grandmother     Skin cancer  . Heart attack Maternal Grandfather   . Cancer Paternal Grandfather   . Hypertension Paternal Grandfather     ROS is negative for fevers, chills or night sweats.  He denies skin rash. He denies change in vision. He denies change in hearing. He denies shortness of breath. There is no wheezing. He denies presyncope or syncope. He denies chest pain. He denies abdominal pain nausea vomiting or diarrhea. He does have some mild erectile dysfunction and takes dialysis. He denies blood in stool or urine. He denies significant leg swelling there is no  thyroid issues. He is unaware of diabetes. He denies neurologic symptoms. Other comprehensive 12 point system review is negative.  PE BP 150/90  Pulse 69  Ht 5\' 10"  (1.778 m)  Wt 114.352 kg (252 lb 1.6 oz)  BMI 36.17 kg/m2  General: Alert, oriented, no distress.  Skin: normal turgor, no rashes HEENT: Normocephalic,  atraumatic. Pupils round and reactive; sclera anicteric;no lid lag.  Nose without nasal septal hypertrophy Mouth/Parynx benign; Mallinpatti scale 3 Neck: No JVD, no carotid briuts Lungs: clear to ausculatation and percussion; no wheezing or rales Heart: RRR, s1 s2 normal 1/6 systolic murmur. Abdomen: Mild central adiposity; soft, nontender; no hepatosplenomehaly, BS+; abdominal aorta nontender and not dilated by palpation. Pulses 2+ Extremities: no clubbing cyanosis or edema, Homan's sign negative  Neurologic: grossly nonfocal Psychologic: normal affect and mood.  ECG: Normal sinus rhythm at 69 beats per minute. No ectopy. Normal intervals.  LABS:  BMET    Component Value Date/Time   NA 141 07/22/2013 0835   NA 144 06/10/2013 0827   K 4.1 07/22/2013 0835   CL 105 07/22/2013 0835   CO2 28 07/22/2013 0835   GLUCOSE 96 07/22/2013 0835   GLUCOSE 95 06/10/2013 0827   BUN 15 07/22/2013 0835   BUN 15 06/10/2013 0827   CREATININE 0.87 07/22/2013 0835   CREATININE 0.79 06/10/2013 0827   CALCIUM 9.5 07/22/2013 0835   GFRNONAA 95 06/10/2013 0827   GFRAA 110 06/10/2013 0827     Hepatic Function Panel     Component Value Date/Time   PROT 6.0 07/22/2013 0835   PROT 7.0 12/12/2012 1649   ALBUMIN 4.4 07/22/2013 0835   AST 23 07/22/2013 0835   ALT 23 07/22/2013 0835   ALKPHOS 71 07/22/2013 0835   BILITOT 0.5 07/22/2013 0835     CBC    Component Value Date/Time   WBC 11.1* 03/13/2011 0532   RBC 3.25* 03/13/2011 0532   HGB 9.3* 03/13/2011 0532   HCT 27.5* 03/13/2011 0532   PLT 184 03/13/2011 0532   MCV 84.6 03/13/2011 0532   MCH 28.6 03/13/2011 0532   MCHC 33.8 03/13/2011 0532    RDW 13.5 03/13/2011 0532   LYMPHSABS 2.2 03/03/2011 1050   MONOABS 0.8 03/03/2011 1050   EOSABS 0.4 03/03/2011 1050   BASOSABS 0.1 03/03/2011 1050     BNP No results found for this basename: probnp    Lipid Panel     Component Value Date/Time   TRIG 253* 07/22/2013 0835   TRIG 291* 06/10/2013 0827   HDL 34* 06/10/2013 0827   CHOLHDL 5.8* 06/10/2013 0827   LDLCALC 102* 07/22/2013 0835   LDLCALC 104* 06/10/2013 0827     RADIOLOGY: No results found.    ASSESSMENT AND PLAN:  My impression is that Mr. Jablonsky is a 62 year old gentleman who has a history of mild obesity, hyperlipidemia as well as hypertension. His blood pressure today is elevated despite taking amlodipine 10 mg Lotensin 20 mg hydrochlorothiazide 25 mg. I am recommending further titration of his but as apparel to 40 mg. He apparently cannot take statins. He is on Zetia. I discussed with him that his triglycerides are elevated. He may benefit from fish oil. I am rechecking a CMP as well as an NMR profile since he appears to have atherogenic dyslipidemia pattern to further evaluation of particle number, particle size, and insulin resistance. Also you back in the office in 3 months for followup evaluation.     Lennette Bihari, MD, [facility]  07/25/2013 4:47 PM

## 2013-07-29 ENCOUNTER — Encounter: Payer: Self-pay | Admitting: Cardiovascular Disease

## 2013-08-06 ENCOUNTER — Encounter: Payer: Self-pay | Admitting: Internal Medicine

## 2013-08-08 NOTE — Telephone Encounter (Signed)
Last OV 06/11/2013, please advise if patient needs an appointment or ok to call in cough medication

## 2013-08-12 ENCOUNTER — Other Ambulatory Visit: Payer: Self-pay | Admitting: *Deleted

## 2013-08-12 MED ORDER — OMEGA-3-ACID ETHYL ESTERS 1 G PO CAPS: 1.0000 g | ORAL_CAPSULE | Freq: Two times a day (BID) | ORAL | Status: DC

## 2013-08-26 ENCOUNTER — Telehealth: Payer: Self-pay | Admitting: *Deleted

## 2013-08-26 ENCOUNTER — Other Ambulatory Visit: Payer: Self-pay | Admitting: Internal Medicine

## 2013-08-26 ENCOUNTER — Ambulatory Visit (INDEPENDENT_AMBULATORY_CARE_PROVIDER_SITE_OTHER): Payer: Medicare HMO | Admitting: Cardiology

## 2013-08-26 ENCOUNTER — Encounter: Payer: Self-pay | Admitting: Cardiology

## 2013-08-26 VITALS — BP 160/80 | HR 76 | Ht 70.0 in | Wt 265.0 lb

## 2013-08-26 DIAGNOSIS — R079 Chest pain, unspecified: Secondary | ICD-10-CM | POA: Insufficient documentation

## 2013-08-26 DIAGNOSIS — E669 Obesity, unspecified: Secondary | ICD-10-CM

## 2013-08-26 DIAGNOSIS — E785 Hyperlipidemia, unspecified: Secondary | ICD-10-CM

## 2013-08-26 DIAGNOSIS — I10 Essential (primary) hypertension: Secondary | ICD-10-CM

## 2013-08-26 MED ORDER — AMLODIPINE BESYLATE 5 MG PO TABS: 5.0000 mg | ORAL_TABLET | Freq: Every day | ORAL | Status: DC

## 2013-08-26 MED ORDER — NITROGLYCERIN 0.4 MG SL SUBL: 0.4000 mg | SUBLINGUAL_TABLET | SUBLINGUAL | Status: DC | PRN

## 2013-08-26 NOTE — Telephone Encounter (Signed)
Patient walked in to the office and states that he has been running a high BP(181/64) since last night. He also states that he has had a cold last week, headache and chest tightness. He requested that we take his BP to confirm that it is high, BP(162/98). Patient was told to go to urgent care for evaluation, he states that he would prefer to go to his cardiologist(Kelly). I informed him that he should let them know what is going on with him as soon as he walks though the door, so that they would be aware.

## 2013-08-26 NOTE — Assessment & Plan Note (Signed)
Chest "tightness" since Saturday.

## 2013-08-26 NOTE — Assessment & Plan Note (Signed)
Statin intol 

## 2013-08-26 NOTE — Assessment & Plan Note (Signed)
Elevated BP

## 2013-08-26 NOTE — Patient Instructions (Addendum)
Your physician has requested that you have a lexiscan myoview. For further information please visit [email or web address]. Please follow instruction sheet, as given.  Your physician has requested that you have an echocardiogram. Echocardiography is a painless test that uses sound waves to create images of your heart. It provides your doctor with information about the size and shape of your heart and how well your heart's chambers and valves are working. This procedure takes approximately one hour. There are no restrictions for this procedure.  Start Norvasc 5 mg daily. NTG if needed for severe chest pain. DO NOT TAKE CIALIS till cleared by Dr Claiborne Billings  Your physician recommends that you schedule a follow-up appointment in: 3-4 weeks with Dr Claiborne Billings

## 2013-08-26 NOTE — Progress Notes (Signed)
08/26/2013 Anthony [name]   1951-04-18  [id number]  Primary Physicia GREEN, Viviann Spare, MD Primary Cardiologist: Dr Claiborne Billings  HPI:  The pt is seen this afternoon as a walk in with chest pain. He is a 63 y/o with a history of HTN, dyslipidemia, and obesity. He had a low risk Myoview Oct 2013. He recently saw Dr Claiborne Billings in the office 07/25/13 and his B/P was noted to be elevated. Dr Claiborne Billings increased his Lisinopril to 40 mg and stopped his Norvasc. 48 hrs ago he says he woke up with chest pressure and tightness. He went to sleep in a recliner. He noted his B/P was elevated at home -176/98. This morning he went to a pharmacy to have his B/P checked and again it was elevated. He went to his primary care office this afternoon and was told to go to an Urgent Care facility but he came here instead. He tells me his brother died two weeks ago from a stroke at 65 y/o and he admits this has shook him up. He denies any radiation to his jaw, he has no nausea or diaphoresis. He does complain of a "headache".    Current Outpatient Prescriptions  Medication Sig Dispense Refill  . aspirin 81 MG tablet Take 81 mg by mouth daily. Take one tablet once daily for heart health.      . benazepril (LOTENSIN) 40 MG tablet Take 1 tablet (40 mg total) by mouth daily.  30 tablet  6  . Dorzolamide HCl-Timolol Mal PF 22.3-6.8 MG/ML SOLN Apply to eye. One drop twice daily into right eye.      . esomeprazole (NEXIUM) 40 MG capsule Take 1 capsule (40 mg total) by mouth daily before breakfast. For reflux  90 capsule  3  . ezetimibe (ZETIA) 10 MG tablet Take 1 tablet (10 mg total) by mouth daily. For cholesterol  90 tablet  3  . finasteride (PROSCAR) 5 MG tablet TAKE 1 TABLET BY MOUTH DAILY TO HELP PROSTRATE  30 tablet  5  . hydrochlorothiazide (HYDRODIURIL) 25 MG tablet Take 1 tablet (25 mg total) by mouth daily. For blood pressure  90 tablet  3  . latanoprost (XALATAN) 0.005 % ophthalmic solution 1 drop at bedtime. One drop once daily into the  right eye.      . methocarbamol (ROBAXIN) 500 MG tablet Take 1 tablet (500 mg total) by mouth every 6 (six) hours. For pain  360 tablet  3  . Multiple Vitamins-Minerals (MULTIVITAMIN WITH MINERALS) tablet Take 1 tablet by mouth daily.      Marland Kitchen omega-3 acid ethyl esters (LOVAZA) 1 G capsule Take 1 capsule (1 g total) by mouth 2 (two) times daily.  60 capsule  11  . amLODipine (NORVASC) 5 MG tablet Take 1 tablet (5 mg total) by mouth daily.  180 tablet  3  . nitroGLYCERIN (NITROSTAT) 0.4 MG SL tablet Place 1 tablet (0.4 mg total) under the tongue every 5 (five) minutes as needed for chest pain.  25 tablet  2   No current facility-administered medications for this visit.    Allergies  Allergen Reactions  . Codeine   . Lipitor [Atorvastatin]   . Red Yeast Rice [Cholestin]     History   Social History  . Marital Status: Married    Spouse Name: N/A    Number of Children: N/A  . Years of Education: N/A   Occupational History  . Not on file.   Social History Main Topics  . Smoking  status: Never Smoker   . Smokeless tobacco: Not on file  . Alcohol Use: No  . Drug Use: No  . Sexual Activity: Yes    Partners: Female     Comment: wife   Other Topics Concern  . Not on file   Social History Narrative  . No narrative on file     Review of Systems: General: negative for chills, fever, night sweats or weight changes.  Cardiovascular:  dyspnea on exertion, edema, orthopnea, palpitations, paroxysmal nocturnal dyspnea or shortness of breath Dermatological: negative for rash Respiratory: negative for cough or wheezing Urologic: negative for hematuria Abdominal: negative for nausea, vomiting, diarrhea, bright red blood per rectum, melena, or hematemesis Neurologic: negative for visual changes, syncope, or dizziness All other systems reviewed and are otherwise negative except as noted above.    Blood pressure 160/80, pulse 76, height 5\' 10"  (1.778 m), weight 265 lb (120.203 kg).   General appearance: alert, cooperative and no distress Neck: no carotid bruit and no JVD Lungs: clear to auscultation bilaterally Heart: regular rate and rhythm Skin: cool and dry Neurologic: Grossly normal  EKG: NSR without acute changes  ASSESSMENT AND PLAN:   Chest pain with moderate risk for cardiac etiology Chest "tightness" since Saturday.  Hypertension Elevated B/P  Obesity (BMI 30-39.9) .  Hyperlipidemia Statin intol   PLAN: I moved up his scheduled echo to this week. I am going to get a Lexiscan as well. I told the pt to resume his Norvasc 5 mg. He was given a prescription for NTG prn with instructions to not take Cialis till cleared by Dr Claiborne Billings.   Nelva Hauk KPA-C 08/26/2013 4:01 PM

## 2013-08-29 ENCOUNTER — Ambulatory Visit (HOSPITAL_COMMUNITY)
Admission: RE | Admit: 2013-08-29 | Discharge: 2013-08-29 | Disposition: A | Discharge status: Home / Self Care | Payer: Medicare HMO | Source: Ambulatory Visit | Attending: Cardiovascular Disease | Admitting: Cardiovascular Disease

## 2013-08-29 DIAGNOSIS — R079 Chest pain, unspecified: Secondary | ICD-10-CM | POA: Insufficient documentation

## 2013-08-29 MED ORDER — TECHNETIUM TC 99M SESTAMIBI GENERIC - CARDIOLITE
31.2000 | Freq: Once | INTRAVENOUS | Status: AC | PRN
  Administered 2013-08-29: 31.2 via INTRAVENOUS

## 2013-08-29 MED ORDER — AMINOPHYLLINE 25 MG/ML IV SOLN
150.0000 mg | Freq: Once | INTRAVENOUS | Status: AC
  Administered 2013-08-29: 150 mg via INTRAVENOUS

## 2013-08-29 MED ORDER — REGADENOSON 0.4 MG/5ML IV SOLN
0.4000 mg | Freq: Once | INTRAVENOUS | Status: AC
  Administered 2013-08-29: 0.4 mg via INTRAVENOUS

## 2013-08-29 MED ORDER — TECHNETIUM TC 99M SESTAMIBI GENERIC - CARDIOLITE
10.5000 | Freq: Once | INTRAVENOUS | Status: AC | PRN
  Administered 2013-08-29: 11 via INTRAVENOUS

## 2013-08-29 NOTE — Procedures (Addendum)
[facility] [address] [place] [address] [phone]  Cardiology Nuclear Med Study  Anthony [name] is a 63 y.o. male     MRN : [id number]     DOB: [birth year]  Procedure Date: 08/29/2013  Nuclear Med Background Indication for Stress Test:  Evaluation for Ischemia History:  Gerd Cardiac Risk Factors: Family History - CAD, Hypertension, Lipids and Obesity  Symptoms:  Chest Pain, Dizziness, DOE, Light-Headedness, Near Syncope, Palpitations and SOB    Nuclear Pre-Procedure Caffeine/Decaff Intake:  1:00am NPO After: 11 am   IV Site: R Forearm  IV 0.9% NS with Angio Cath:  22g  Chest Size (in):  46"  IV Started by: Azucena Cecil, RN  Height: 5\' 10"  (1.778 m)  Cup Size: n/a  BMI:  Body mass index is 38.02 kg/(m^2). Weight:  265 lb (120.203 kg)   Tech Comments:  n/a    Nuclear Med Study 1 or 2 day study: 1 day  Stress Test Type:  [employer] Provider:  Shelva Majestic, MD   Resting Radionuclide: Technetium 63m Sestamibi  Resting Radionuclide Dose: 10.5 mCi   Stress Radionuclide:  Technetium 71m Sestamibi  Stress Radionuclide Dose: 31.2 mCi           Stress Protocol Rest HR: 68 Stress HR: 93  Rest BP: 176/100 Stress BP: 181/98  Exercise Time (min): n/a METS: n/a          Dose of Adenosine (mg):  n/a Dose of Lexiscan: 0.4 mg  Dose of Atropine (mg): n/a Dose of Dobutamine: n/a mcg/kg/min (at max HR)  Stress Test Technologist: Mellody Memos, CCT Nuclear Technologist: Imagene Riches, CNMT   Rest Procedure:  Myocardial perfusion imaging was performed at rest 45 minutes following the intravenous administration of Technetium 21m Sestamibi. Stress Procedure:  The patient received IV Lexiscan 0.4 mg over 15-seconds.  Technetium 75m Sestamibi injected at 30-seconds.  Due to patient's shortness of breath, chest tightness, increased head pain and stomach pain, he was given IV Aminophylline 150 mg. Symptoms were  resolved during recovery. There were no significant changes with Lexiscan.  Quantitative spect images were obtained after a 45 minute delay.  Transient Ischemic Dilatation (Normal <1.22):  1.17 Lung/Heart Ratio (Normal <0.45):  0.27 QGS EDV:  117 ml QGS ESV:  54 ml LV Ejection Fraction: 54%     Rest ECG: NSR - Normal EKG  Stress ECG: No significant change from baseline ECG  QPS Raw Data Images:  Normal; no motion artifact; normal heart/lung ratio. Stress Images:  Normal homogeneous uptake in all areas of the myocardium. Rest Images:  Normal homogeneous uptake in all areas of the myocardium. Subtraction (SDS):  No evidence of ischemia.  Impression Exercise Capacity:  Lexiscan with no exercise. BP Response:  Hypertensive blood pressure response. Clinical Symptoms:  No significant symptoms noted. ECG Impression:  No significant ST segment change suggestive of ischemia. Comparison with Prior Nuclear Study: No significant change from previous study  Overall Impression:  Normal stress nuclear study.  LV Wall Motion:  NL LV Function; NL Wall Motion   Anthony Guild, MD  08/29/2013 5:50 PM

## 2013-08-30 ENCOUNTER — Telehealth: Payer: Self-pay | Admitting: Cardiovascular Disease

## 2013-08-30 NOTE — Telephone Encounter (Signed)
Returned call and pt verified x 2 w/ Peter Congo, pt's wife.  Stated pt seen Monday and had stress test yesterday.  Pt c/o L upper chest/shoulder pain when turning neck to L.    RN reviewed chart and stress test was normal per result note (pt given results this AM).  Dr. Gwenlyn Found notified and advised pt see PCP.  Wife notified and verbalized understanding.  Agreed w/ plan.

## 2013-08-30 NOTE — Telephone Encounter (Signed)
Pt had stress test yesterday,he is experiencing some weird  Sharp pains in his upper chest.

## 2013-09-04 ENCOUNTER — Ambulatory Visit (HOSPITAL_BASED_OUTPATIENT_CLINIC_OR_DEPARTMENT_OTHER)
Admission: RE | Admit: 2013-09-04 | Discharge: 2013-09-04 | Disposition: A | Discharge status: Home / Self Care | Payer: Medicare HMO | Source: Ambulatory Visit | Attending: Cardiovascular Disease | Admitting: Cardiovascular Disease

## 2013-09-04 ENCOUNTER — Encounter (HOSPITAL_COMMUNITY): Payer: Self-pay | Admitting: Pharmacy Technician

## 2013-09-04 ENCOUNTER — Ambulatory Visit
Admission: RE | Admit: 2013-09-04 | Discharge: 2013-09-04 | Disposition: A | Discharge status: Home / Self Care | Payer: Medicare HMO | Source: Ambulatory Visit | Attending: Cardiology | Admitting: Cardiology

## 2013-09-04 ENCOUNTER — Other Ambulatory Visit: Payer: Self-pay | Admitting: *Deleted

## 2013-09-04 ENCOUNTER — Encounter: Payer: Self-pay | Admitting: Cardiology

## 2013-09-04 ENCOUNTER — Encounter: Payer: Self-pay | Admitting: Cardiovascular Disease

## 2013-09-04 ENCOUNTER — Ambulatory Visit (INDEPENDENT_AMBULATORY_CARE_PROVIDER_SITE_OTHER): Payer: Medicare HMO | Admitting: Cardiology

## 2013-09-04 VITALS — BP 142/92 | HR 74 | Ht 70.0 in | Wt 259.7 lb

## 2013-09-04 DIAGNOSIS — E669 Obesity, unspecified: Secondary | ICD-10-CM

## 2013-09-04 DIAGNOSIS — Z01818 Encounter for other preprocedural examination: Secondary | ICD-10-CM

## 2013-09-04 DIAGNOSIS — E785 Hyperlipidemia, unspecified: Secondary | ICD-10-CM

## 2013-09-04 DIAGNOSIS — I10 Essential (primary) hypertension: Secondary | ICD-10-CM

## 2013-09-04 DIAGNOSIS — R079 Chest pain, unspecified: Secondary | ICD-10-CM

## 2013-09-04 DIAGNOSIS — Z79899 Other long term (current) drug therapy: Secondary | ICD-10-CM

## 2013-09-04 DIAGNOSIS — D689 Coagulation defect, unspecified: Secondary | ICD-10-CM

## 2013-09-04 DIAGNOSIS — M1990 Unspecified osteoarthritis, unspecified site: Secondary | ICD-10-CM | POA: Insufficient documentation

## 2013-09-04 DIAGNOSIS — K219 Gastro-esophageal reflux disease without esophagitis: Secondary | ICD-10-CM

## 2013-09-04 LAB — CBC
HCT: 47.5 % (ref 39.0–52.0)
Hemoglobin: 16.4 g/dL (ref 13.0–17.0)
MCH: 29.8 pg (ref 26.0–34.0)
MCHC: 34.5 g/dL (ref 30.0–36.0)
MCV: 86.4 fL (ref 78.0–100.0)
Platelets: 200 10*3/uL (ref 150–400)
RBC: 5.5 MIL/uL (ref 4.22–5.81)
RDW: 13.4 % (ref 11.5–15.5)
WBC: 9.3 10*3/uL (ref 4.0–10.5)

## 2013-09-04 LAB — COMPREHENSIVE METABOLIC PANEL
ALT: 22 U/L (ref 0–53)
AST: 18 U/L (ref 0–37)
Albumin: 4.4 g/dL (ref 3.5–5.2)
Alkaline Phosphatase: 69 U/L (ref 39–117)
BUN: 20 mg/dL (ref 6–23)
CO2: 28 mEq/L (ref 19–32)
Calcium: 9.7 mg/dL (ref 8.4–10.5)
Chloride: 104 mEq/L (ref 96–112)
Creat: 0.98 mg/dL (ref 0.50–1.35)
Glucose, Bld: 91 mg/dL (ref 70–99)
Potassium: 4.3 mEq/L (ref 3.5–5.3)
Sodium: 140 mEq/L (ref 135–145)
Total Bilirubin: 0.6 mg/dL (ref 0.3–1.2)
Total Protein: 6.5 g/dL (ref 6.0–8.3)

## 2013-09-04 LAB — APTT: aPTT: 29 seconds (ref 24–37)

## 2013-09-04 LAB — PROTIME-INR
INR: 0.95 (ref <1.50)
Prothrombin Time: 12.6 seconds (ref 11.6–15.2)

## 2013-09-04 NOTE — Progress Notes (Signed)
09/04/2013 Anthony [name]   22-Apr-1951  [id number]  Primary Physicia GREEN, Viviann Spare, MD Primary Cardiologist: Dr Claiborne Billings  HPI:  The pt is a 63 y/o with a history of HTN, dyslipidemia, and obesity. He had a low risk Myoview Oct 2013. He recently saw Dr Claiborne Billings in the office 07/25/13 and his B/P was noted to be elevated. Dr Claiborne Billings increased his Lisinopril to 40 mg and stopped his Norvasc. I saw him in the office 08/26/13 when he came in with chest pain. I asked him to resume his Amlodipine and ordered an echo and Myoview. He is seen today after for follow up as he says he is still having chest pain. He describes SSCP "pressure, tightness" that comes on with exertion, "walking" and stops with rest. He has not used the NTG I gave him last OV. His Myoview was low risk and his echo was essentially normal. He was seen by Dr Claiborne Billings and myself in the office today and after discussion with the pt it is decided we will proceed with a diagnostic cath in the am. Risks and benefits of the procedure were explained to the pt and he is agreeable to proceed.   PMHx: HTN, dyslipidemia, DJD, BPH, obesity, GERD,     Current Outpatient Prescriptions  Medication Sig Dispense Refill  . amLODipine (NORVASC) 5 MG tablet Take 1 tablet (5 mg total) by mouth daily.  180 tablet  3  . aspirin 81 MG tablet Take 81 mg by mouth daily. Take one tablet once daily for heart health.      . benazepril (LOTENSIN) 40 MG tablet Take 1 tablet (40 mg total) by mouth daily.  30 tablet  6  . Dorzolamide HCl-Timolol Mal PF 22.3-6.8 MG/ML SOLN Apply to eye. One drop twice daily into right eye.      . esomeprazole (NEXIUM) 40 MG capsule Take 1 capsule (40 mg total) by mouth daily before breakfast. For reflux  90 capsule  3  . ezetimibe (ZETIA) 10 MG tablet Take 1 tablet (10 mg total) by mouth daily. For cholesterol  90 tablet  3  . hydrochlorothiazide (HYDRODIURIL) 25 MG tablet Take 1 tablet (25 mg total) by mouth daily. For blood pressure  90 tablet  3   . latanoprost (XALATAN) 0.005 % ophthalmic solution 1 drop at bedtime. One drop once daily into the right eye.      . methocarbamol (ROBAXIN) 500 MG tablet Take 1 tablet (500 mg total) by mouth every 6 (six) hours. For pain  360 tablet  3  . Multiple Vitamins-Minerals (MULTIVITAMIN WITH MINERALS) tablet Take 1 tablet by mouth daily.      . nitroGLYCERIN (NITROSTAT) 0.4 MG SL tablet Place 1 tablet (0.4 mg total) under the tongue every 5 (five) minutes as needed for chest pain.  25 tablet  2  . omega-3 acid ethyl esters (LOVAZA) 1 G capsule Take 1 capsule (1 g total) by mouth 2 (two) times daily.  60 capsule  11  . finasteride (PROSCAR) 5 MG tablet Take 5 mg by mouth daily.      Marland Kitchen loratadine (CLARITIN) 10 MG tablet Take 10 mg by mouth daily.       No current facility-administered medications for this visit.    Allergies  Allergen Reactions  . Codeine     Headache  . Latex   . Lipitor [Atorvastatin]     Leg pain/cramps  . Red Yeast Rice [Cholestin]     Makes legs hurt  History   Social History  . Marital Status: Married    Spouse Name: N/A    Number of Children: N/A  . Years of Education: N/A   Occupational History  . Not on file.   Social History Main Topics  . Smoking status: Never Smoker   . Smokeless tobacco: Not on file  . Alcohol Use: No  . Drug Use: No  . Sexual Activity: Yes    Partners: Female     Comment: wife   Other Topics Concern  . Not on file   Social History Narrative  . No narrative on file   FM Hx: Pt's brother had NICM and died of a stroke 3 weeks agao.  Review of Systems: General: negative for chills, fever, night sweats or weight changes.  Cardiovascular: orthopnea, palpitations, paroxysmal nocturnal dyspnea or shortness of breath Dermatological: negative for rash Respiratory: negative for cough or wheezing Urologic: negative for hematuria Abdominal: negative for nausea, vomiting, diarrhea, bright red blood per rectum, melena, or  hematemesis Neurologic: negative for visual changes, syncope, or dizziness All other systems reviewed and are otherwise negative except as noted above.    Blood pressure 142/92, pulse 74, height 5\' 10"  (1.778 m), weight 259 lb 11.2 oz (117.799 kg).  General appearance: alert, cooperative, no distress and moderately obese Neck: no carotid bruit and no JVD Lungs: clear to auscultation bilaterally Heart: regular rate and rhythm Abdomen: soft, non-tender; bowel sounds normal; no masses,  no organomegaly Extremities: extremities normal, atraumatic, no cyanosis or edema Pulses: 2+ and symmetric Skin: Skin color, texture, turgor normal. No rashes or lesions Neurologic: Grossly normal  EKG Jan 5 th NSR no acute changes  ASSESSMENT AND PLAN:   Chest pain with moderate risk for cardiac etiology He continues to complain of SSCP. Myoview low risk (#6), echo done this am.  Hypertension Better control with resumption of Amlodipine.  Hyperlipidemia Statin intol, last LDL 102 12/14  Obesity (BMI 30-39.9) Sleep study ordered in the past but he never had it done  GERD (gastroesophageal reflux disease) On PPI  DJD (degenerative joint disease) On disability, s/p bilat THR and TKR    PLAN  Cath tomorrow with Dr Claiborne Billings.  Markeisha Mancias KPA-C 09/04/2013 12:02 PM

## 2013-09-04 NOTE — Progress Notes (Signed)
2D Echo Performed 06/25/2014    Dainelle Hun, RCS  

## 2013-09-04 NOTE — Assessment & Plan Note (Addendum)
Statin intol, last LDL 102 12/14

## 2013-09-04 NOTE — Assessment & Plan Note (Signed)
Better control with resumption of Amlodipine.

## 2013-09-04 NOTE — Assessment & Plan Note (Signed)
He continues to complain of SSCP. Myoview low risk (#6), echo done this am.

## 2013-09-04 NOTE — Assessment & Plan Note (Signed)
On disability, s/p bilat THR and TKR

## 2013-09-04 NOTE — Assessment & Plan Note (Signed)
Sleep study ordered in the past but he never had it done

## 2013-09-04 NOTE — Assessment & Plan Note (Signed)
On PPI

## 2013-09-04 NOTE — Patient Instructions (Addendum)
Your physician has requested that you have a cardiac catheterization. Cardiac catheterization is used to diagnose and/or treat various heart conditions. Doctors may recommend this procedure for a number of different reasons. The most common reason is to evaluate chest pain. Chest pain can be a symptom of coronary artery disease (CAD), and cardiac catheterization can show whether plaque is narrowing or blocking your heart's arteries. This procedure is also used to evaluate the valves, as well as measure the blood flow and oxygen levels in different parts of your heart. For further information please visit [email or web address]. Please follow instruction sheet, as given.  Your physician recommends that you return for lab work in: Today CBC, CMP, INR/PT, PTT  A chest x-ray takes a picture of the organs and structures inside the chest, including the heart, lungs, and blood vessels. This test can show several things, including, whether the heart is enlarges; whether fluid is building up in the lungs; and whether pacemaker / defibrillator leads are still in place.

## 2013-09-05 ENCOUNTER — Encounter (HOSPITAL_COMMUNITY)
Admission: RE | Disposition: A | Discharge status: Home / Self Care | Payer: Self-pay | Source: Ambulatory Visit | Attending: Cardiovascular Disease

## 2013-09-05 ENCOUNTER — Ambulatory Visit (HOSPITAL_COMMUNITY)
Admission: RE | Admit: 2013-09-05 | Discharge: 2013-09-05 | Disposition: A | Discharge status: Home / Self Care | Payer: Medicare HMO | Source: Ambulatory Visit | Attending: Cardiovascular Disease | Admitting: Cardiovascular Disease

## 2013-09-05 DIAGNOSIS — E785 Hyperlipidemia, unspecified: Secondary | ICD-10-CM

## 2013-09-05 DIAGNOSIS — I10 Essential (primary) hypertension: Secondary | ICD-10-CM

## 2013-09-05 DIAGNOSIS — E669 Obesity, unspecified: Secondary | ICD-10-CM

## 2013-09-05 DIAGNOSIS — Z8249 Family history of ischemic heart disease and other diseases of the circulatory system: Secondary | ICD-10-CM | POA: Insufficient documentation

## 2013-09-05 DIAGNOSIS — Z01818 Encounter for other preprocedural examination: Secondary | ICD-10-CM

## 2013-09-05 DIAGNOSIS — R079 Chest pain, unspecified: Secondary | ICD-10-CM

## 2013-09-05 DIAGNOSIS — Z7982 Long term (current) use of aspirin: Secondary | ICD-10-CM | POA: Insufficient documentation

## 2013-09-05 HISTORY — PX: LEFT HEART CATHETERIZATION WITH CORONARY ANGIOGRAM: SHX5451

## 2013-09-05 SURGERY — LEFT HEART CATHETERIZATION WITH CORONARY ANGIOGRAM
Anesthesia: LOCAL

## 2013-09-05 MED ORDER — ONDANSETRON HCL 4 MG/2ML IJ SOLN: 4.0000 mg | Freq: Four times a day (QID) | INTRAMUSCULAR | Status: DC | PRN

## 2013-09-05 MED ORDER — FENTANYL CITRATE 0.05 MG/ML IJ SOLN
INTRAMUSCULAR | Status: AC
  Filled 2013-09-05: qty 2

## 2013-09-05 MED ORDER — DIAZEPAM 5 MG PO TABS
5.0000 mg | ORAL_TABLET | ORAL | Status: AC
  Administered 2013-09-05: 5 mg via ORAL
  Filled 2013-09-05: qty 1

## 2013-09-05 MED ORDER — SODIUM CHLORIDE 0.9 % IV SOLN: INTRAVENOUS | Status: DC

## 2013-09-05 MED ORDER — ACETAMINOPHEN 325 MG PO TABS
650.0000 mg | ORAL_TABLET | ORAL | Status: DC | PRN
  Administered 2013-09-05: 650 mg via ORAL
  Filled 2013-09-05: qty 2

## 2013-09-05 MED ORDER — SODIUM CHLORIDE 0.9 % IV SOLN
INTRAVENOUS | Status: DC
  Administered 2013-09-05: 07:00:00 via INTRAVENOUS

## 2013-09-05 MED ORDER — HEPARIN (PORCINE) IN NACL 2-0.9 UNIT/ML-% IJ SOLN
INTRAMUSCULAR | Status: AC
  Filled 2013-09-05: qty 1000

## 2013-09-05 MED ORDER — LIDOCAINE HCL (PF) 1 % IJ SOLN
INTRAMUSCULAR | Status: AC
  Filled 2013-09-05: qty 30

## 2013-09-05 MED ORDER — MIDAZOLAM HCL 2 MG/2ML IJ SOLN
INTRAMUSCULAR | Status: AC
  Filled 2013-09-05: qty 2

## 2013-09-05 MED ORDER — NITROGLYCERIN 0.2 MG/ML ON CALL CATH LAB
INTRAVENOUS | Status: AC
  Filled 2013-09-05: qty 1

## 2013-09-05 MED ORDER — SODIUM CHLORIDE 0.9 % IJ SOLN: 3.0000 mL | Freq: Two times a day (BID) | INTRAMUSCULAR | Status: DC

## 2013-09-05 NOTE — CV Procedure (Signed)
Anthony [name] is a 63 y.o. male    [id number]  [id number] LOCATION:  FACILITY: [facility]  PHYSICIAN: Troy Sine, MD, [facility] 1950-10-14   DATE OF PROCEDURE:  09/05/2013    CARDIAC CATHETERIZATION      HISTORY:  [name], [name] is a 63 year old gentleman who has a history of hypertension, dyslipidemia, obesity, and family history for coronary artery disease. A previous nuclear perfusion study in 2013 for chest pain was low risk. The bowel recent increasing recurrent episodes of chest pain. He underwent a additional Myoview study which again was low risk. He presented as an add-on in the office yesterday and was seen by [name] in place of recurrent chest tightness only with exertion with improvement with rest. Because of recurrent symptomatology, definitive cardiac catheterization was recommended.   PROCEDURE:  The patient was brought to the second floor [facility] cath lab in the postabsorptive state. He was premedicated with 2 mg of Versed and 50 mcg of fentanyl intravenously. His right groin was prepped and shaved in usual sterile fashion. Xylocaine 1% was used for local anesthesia. A 5 French sheath was inserted into the right femoral artery. Diagnostic catheterizatiion was done with 5 [place] LF4, FR4, and pigtail catheters. Left ventriculography was done with 25 cc Omnipaque contrast. Hemostasis was obtained by direct manual compression. The patient tolerated the procedure well.   HEMODYNAMICS:   Central Aorta: 150/84   Left Ventricle: 150/16  ANGIOGRAPHY:  Left main: Angiographically normal vessel which trifurcated into an LAD, a ramus intermediate vessel, and left circumflex coronary artery. LAD: Angiographically normal vessel which gave rise to 2 proximal diagonal vessels and septal perforating artery and extended to the LV apex. Ramus Intermediate: Small angiographically normal vessel Left circumflex: Angiographically normal vessel which gave rise to a left atrial  circumflex branch and the major marginal vessel which extended to the LV apex.  Right coronary artery: Angiographically normal dominant vessel.   Left ventriculography revealed normal LV function without focal segmental wall motion abnormalities. Ejection fraction 55%. No mitral regurgitation.  IMPRESSION:  Normal LV function  Normal coronary arteries   Troy Sine, MD, [facility] 09/05/2013 12:39 PM

## 2013-09-05 NOTE — Progress Notes (Signed)
Discharge instruction given per MD order.  Pt and CG able to verbalize understanding.  Pt to car via wheelchair. 

## 2013-09-05 NOTE — Progress Notes (Signed)
Site area: right groin  Site Prior to Removal:  Level 0  Pressure Applied For 20 MINUTES    Minutes Beginning at 1250 Manual:   yes  Patient Status During Pull:  VS stable DPT 2+  Post Pull Groin Site:  Level 0  Post Pull Instructions Given:  yes  Post Pull Pulses Present:  yes  Dressing Applied:  yes  Comments:  Pt tolerated sheath pull, alert, hemostatis achieved, right groin level 0 and DPT 2+, sheath pull uneventful.

## 2013-09-05 NOTE — Interval H&P Note (Signed)
Cath Lab Visit (complete for each Cath Lab visit)  Clinical Evaluation Leading to the Procedure:   ACS: no  Non-ACS:    Anginal Classification: CCS III  Anti-ischemic medical therapy: Maximal Therapy (2 or more classes of medications)  Non-Invasive Test Results: Low-risk stress test findings: cardiac mortality <1%/year  Prior CABG: No previous CABG      History and Physical Interval Note:  09/05/2013 11:09 AM  Anthony [name]  has presented today for surgery, with the diagnosis of cp  The various methods of treatment have been discussed with the patient and family. After consideration of risks, benefits and other options for treatment, the patient has consented to  Procedure(s): LEFT HEART CATHETERIZATION WITH CORONARY ANGIOGRAM (N/A) as a surgical intervention .  The patient's history has been reviewed, patient examined, no change in status, stable for surgery.  I have reviewed the patient's chart and labs.  Questions were answered to the patient's satisfaction.     Cristin Szatkowski A

## 2013-09-05 NOTE — H&P (View-Only) (Signed)
09/04/2013 Anthony [name]   02/08/1951  [id number]  Primary Physicia GREEN, ARTHUR G, MD Primary Cardiologist: Dr Kelly  HPI:  The pt is a 63 y/o with a history of HTN, dyslipidemia, and obesity. He had a low risk Myoview Oct 2013. He recently saw Dr Kelly in the office 07/25/13 and his B/P was noted to be elevated. Dr Kelly increased his Lisinopril to 40 mg and stopped his Norvasc. I saw him in the office 08/26/13 when he came in with chest pain. I asked him to resume his Amlodipine and ordered an echo and Myoview. He is seen today after for follow up as he says he is still having chest pain. He describes SSCP "pressure, tightness" that comes on with exertion, "walking" and stops with rest. He has not used the NTG I gave him last OV. His Myoview was low risk and his echo was essentially normal. He was seen by Dr Kelly and myself in the office today and after discussion with the pt it is decided we will proceed with a diagnostic cath in the am. Risks and benefits of the procedure were explained to the pt and he is agreeable to proceed.   PMHx: HTN, dyslipidemia, DJD, BPH, obesity, GERD,     Current Outpatient Prescriptions  Medication Sig Dispense Refill  . amLODipine (NORVASC) 5 MG tablet Take 1 tablet (5 mg total) by mouth daily.  180 tablet  3  . aspirin 81 MG tablet Take 81 mg by mouth daily. Take one tablet once daily for heart health.      . benazepril (LOTENSIN) 40 MG tablet Take 1 tablet (40 mg total) by mouth daily.  30 tablet  6  . Dorzolamide HCl-Timolol Mal PF 22.3-6.8 MG/ML SOLN Apply to eye. One drop twice daily into right eye.      . esomeprazole (NEXIUM) 40 MG capsule Take 1 capsule (40 mg total) by mouth daily before breakfast. For reflux  90 capsule  3  . ezetimibe (ZETIA) 10 MG tablet Take 1 tablet (10 mg total) by mouth daily. For cholesterol  90 tablet  3  . hydrochlorothiazide (HYDRODIURIL) 25 MG tablet Take 1 tablet (25 mg total) by mouth daily. For blood pressure  90 tablet  3   . latanoprost (XALATAN) 0.005 % ophthalmic solution 1 drop at bedtime. One drop once daily into the right eye.      . methocarbamol (ROBAXIN) 500 MG tablet Take 1 tablet (500 mg total) by mouth every 6 (six) hours. For pain  360 tablet  3  . Multiple Vitamins-Minerals (MULTIVITAMIN WITH MINERALS) tablet Take 1 tablet by mouth daily.      . nitroGLYCERIN (NITROSTAT) 0.4 MG SL tablet Place 1 tablet (0.4 mg total) under the tongue every 5 (five) minutes as needed for chest pain.  25 tablet  2  . omega-3 acid ethyl esters (LOVAZA) 1 G capsule Take 1 capsule (1 g total) by mouth 2 (two) times daily.  60 capsule  11  . finasteride (PROSCAR) 5 MG tablet Take 5 mg by mouth daily.      . loratadine (CLARITIN) 10 MG tablet Take 10 mg by mouth daily.       No current facility-administered medications for this visit.    Allergies  Allergen Reactions  . Codeine     Headache  . Latex   . Lipitor [Atorvastatin]     Leg pain/cramps  . Red Yeast Rice [Cholestin]     Makes legs hurt      History   Social History  . Marital Status: Married    Spouse Name: N/A    Number of Children: N/A  . Years of Education: N/A   Occupational History  . Not on file.   Social History Main Topics  . Smoking status: Never Smoker   . Smokeless tobacco: Not on file  . Alcohol Use: No  . Drug Use: No  . Sexual Activity: Yes    Partners: Female     Comment: wife   Other Topics Concern  . Not on file   Social History Narrative  . No narrative on file   FM Hx: Pt's brother had NICM and died of a stroke 3 weeks agao.  Review of Systems: General: negative for chills, fever, night sweats or weight changes.  Cardiovascular: orthopnea, palpitations, paroxysmal nocturnal dyspnea or shortness of breath Dermatological: negative for rash Respiratory: negative for cough or wheezing Urologic: negative for hematuria Abdominal: negative for nausea, vomiting, diarrhea, bright red blood per rectum, melena, or  hematemesis Neurologic: negative for visual changes, syncope, or dizziness All other systems reviewed and are otherwise negative except as noted above.    Blood pressure 142/92, pulse 74, height 5\' 10"  (1.778 m), weight 259 lb 11.2 oz (117.799 kg).  General appearance: alert, cooperative, no distress and moderately obese Neck: no carotid bruit and no JVD Lungs: clear to auscultation bilaterally Heart: regular rate and rhythm Abdomen: soft, non-tender; bowel sounds normal; no masses,  no organomegaly Extremities: extremities normal, atraumatic, no cyanosis or edema Pulses: 2+ and symmetric Skin: Skin color, texture, turgor normal. No rashes or lesions Neurologic: Grossly normal  EKG Jan 5 th NSR no acute changes  ASSESSMENT AND PLAN:   Chest pain with moderate risk for cardiac etiology He continues to complain of SSCP. Myoview low risk (#6), echo done this am.  Hypertension Better control with resumption of Amlodipine.  Hyperlipidemia Statin intol, last LDL 102 12/14  Obesity (BMI 30-39.9) Sleep study ordered in the past but he never had it done  GERD (gastroesophageal reflux disease) On PPI  DJD (degenerative joint disease) On disability, s/p bilat THR and TKR    PLAN  Cath tomorrow with Dr Claiborne Billings.  Iaan Oregel KPA-C 09/04/2013 12:02 PM

## 2013-09-23 ENCOUNTER — Encounter: Payer: Self-pay | Admitting: Cardiovascular Disease

## 2013-09-23 ENCOUNTER — Ambulatory Visit (INDEPENDENT_AMBULATORY_CARE_PROVIDER_SITE_OTHER): Payer: Medicare HMO | Admitting: Cardiovascular Disease

## 2013-09-23 ENCOUNTER — Telehealth: Payer: Self-pay | Admitting: *Deleted

## 2013-09-23 VITALS — BP 158/90 | HR 66 | Ht 70.0 in | Wt 259.7 lb

## 2013-09-23 DIAGNOSIS — E669 Obesity, unspecified: Secondary | ICD-10-CM

## 2013-09-23 DIAGNOSIS — R079 Chest pain, unspecified: Secondary | ICD-10-CM

## 2013-09-23 DIAGNOSIS — R972 Elevated prostate specific antigen [PSA]: Secondary | ICD-10-CM

## 2013-09-23 DIAGNOSIS — K219 Gastro-esophageal reflux disease without esophagitis: Secondary | ICD-10-CM

## 2013-09-23 DIAGNOSIS — I10 Essential (primary) hypertension: Secondary | ICD-10-CM

## 2013-09-23 MED ORDER — AMLODIPINE BESYLATE 5 MG PO TABS: ORAL_TABLET | ORAL | Status: DC

## 2013-09-23 NOTE — Patient Instructions (Addendum)
Your physician recommends that you schedule a follow-up appointment in: 6 MONTHS.  Your physician has recommended you make the following change in your medication: increase the amlodipine to 7.5 mg daily. ( 1& 1/2 tablet)

## 2013-09-23 NOTE — Progress Notes (Signed)
Patient ID: Anthony [name], male   DOB: Nov 24, 1950, 63 y.o.   MRN: [id number]     HPI: Anthony [name] is a 63 y.o. male who presents to the office for follow-up cardiology evaluation following his recent cardiac catheterization.  Anthony [name] has a history of hypertension, mild obesity, as well as hyperlipidemia. In the past, he did develop CPK elevation secondary to statin therapy. He has been able to tolerate Zetia. A nuclear perfusion study done in October 2013 for chest pain showed normal perfusion without scar or ischemia.  When I last saw him in the office several months ago, he had continued to do well and denied any recurrent chest pain episodes or significant shortness of breath. Laboratory in October which showed cholesterol 196, triglycerides significantly elevated at 291, HDL of 34, VLDL increased at 58, and  LDL 106. Renal function was normal. BUN 15 kerning 0.79.   In November, he does admit to being under increased stress. His brother died at age 35 after suffering several strokes. He apparently recurrent episodes of chest pain for which he saw Anthony [name] and because of exertionally precipitated episodes of discomfort definitive cardiac catheterization was recommended. This was done by me on 09/05/2013 and revealed normal coronary arteries with normal LV function without focal segmental wall motion abnormalities. Ejection fraction was 55%.  He underwent an echo Doppler study on 09/04/2013 which showed an ejection fraction of 55-60%. He did have grade 1 diastolic dysfunction and mild left ventricular hypertrophy. He had mild mitral annular calcification with mildly thickened leaflets and trivial mitral regurgitation. A moderate pressure estimate was 27 mm.  He has had amlodipine added to his medical regimen and has felt improved since initiating this. His blood pressure also has improved. He's not had success with weight loss and does admit to weight gain. He presents for evaluation.  Past  Medical History  Diagnosis Date  . Lumbago   . Elevated prostate specific antigen (PSA)   . Benign neoplasm of skin of upper limb, including shoulder   . Unspecified tinnitus   . Other seborrheic keratosis   . Osteoarthrosis, unspecified whether generalized or localized, lower leg   . Hypertrophy of prostate with urinary obstruction and other lower urinary tract symptoms (LUTS)   . Herpes zoster without mention of complication   . Carpal tunnel syndrome   . Impotence of organic origin   . Benign neoplasm of colon   . Other and unspecified hyperlipidemia   . Hypertension   . Special screening for malignant neoplasm of prostate   . Diaphragmatic hernia without mention of obstruction or gangrene   . GERD (gastroesophageal reflux disease)   . Obesity, unspecified   . Other malaise and fatigue   . Hypertension   . Dyslipidemia     Past Surgical History  Procedure Laterality Date  . Inguinal hernia repair  1984    left  . Cholecystectomy  1992  . Eye surgery    . Retinal detachment surgery  2001    right  . Eyeband  2006    removal of right eyeband  . Intraocular lens removal  2008    right  . Total knee arthroplasty  2011, 2012    bilateral  . Total hip arthroplasty  2012    bilateral  . Lower venous extremity doppler  01/26/2011    No evidence of thrombus or thrombophlebitis.  . Cardiovascular stress test  06/15/2012    Non-diagnostic for ischemia. No lexiscan EKG changes.  Marland Kitchen  Transthoracic echocardiogram  10/05/2009    EF >55%, normal LV size, systolic, and diastolic function.    Allergies  Allergen Reactions  . Codeine     Headache  . Latex   . Lipitor [Atorvastatin]     Leg pain/cramps  . Red Yeast Rice [Cholestin]     Makes legs hurt  . Statins     "muscle break down"    Current Outpatient Prescriptions  Medication Sig Dispense Refill  . amLODipine (NORVASC) 5 MG tablet Take 1 tablet (5 mg total) by mouth daily.  180 tablet  3  . aspirin 81 MG tablet Take  81 mg by mouth daily. Take one tablet once daily for heart health.      . benazepril (LOTENSIN) 40 MG tablet Take 1 tablet (40 mg total) by mouth daily.  30 tablet  6  . esomeprazole (NEXIUM) 40 MG capsule Take 1 capsule (40 mg total) by mouth daily before breakfast. For reflux  90 capsule  3  . ezetimibe (ZETIA) 10 MG tablet Take 1 tablet (10 mg total) by mouth daily. For cholesterol  90 tablet  3  . finasteride (PROSCAR) 5 MG tablet Take 5 mg by mouth daily.      . hydrochlorothiazide (HYDRODIURIL) 25 MG tablet Take 1 tablet (25 mg total) by mouth daily. For blood pressure  90 tablet  3  . latanoprost (XALATAN) 0.005 % ophthalmic solution 1 drop at bedtime. One drop once daily into the right eye.      . methocarbamol (ROBAXIN) 500 MG tablet Take 1 tablet (500 mg total) by mouth every 6 (six) hours. For pain  360 tablet  3  . nitroGLYCERIN (NITROSTAT) 0.4 MG SL tablet Place 1 tablet (0.4 mg total) under the tongue every 5 (five) minutes as needed for chest pain.  25 tablet  2  . omega-3 acid ethyl esters (LOVAZA) 1 G capsule Take 1 capsule (1 g total) by mouth 2 (two) times daily.  60 capsule  11   No current facility-administered medications for this visit.    History   Social History  . Marital Status: Married    Spouse Name: N/A    Number of Children: N/A  . Years of Education: N/A   Occupational History  . Not on file.   Social History Main Topics  . Smoking status: Never Smoker   . Smokeless tobacco: Not on file  . Alcohol Use: No  . Drug Use: No  . Sexual Activity: Yes    Partners: Female     Comment: wife   Other Topics Concern  . Not on file   Social History Narrative  . No narrative on file   Social is normal that he is married has 2 children and 2 grandchildren. He is not routinely exercise. There is no tobacco use. He does drink occasional alcohol.  Family History  Problem Relation Age of Onset  . Diabetes Mother   . Arrhythmia Mother   . Hypertension Mother    . Dementia Father   . Pulmonary embolism Father   . Hypertension Father   . Cancer Sister     Liver cancer - Histocytic lymphoma  . Cancer Sister 47    Skin cancer  . Hypertension Sister 50  . Diabetes Sister   . Stroke Brother   . Diabetes Brother   . Hypertension Brother   . Cancer Daughter   . Cancer Maternal Grandmother     Skin cancer  . Heart attack Maternal  Grandfather   . Cancer Paternal Grandfather   . Hypertension Paternal Grandfather     ROS is negative for fevers, chills or night sweats.  He denies skin rash. He denies change in vision. He denies change in hearing. He is unaware of lymphadenopathy. He denies shortness of breath. There is no wheezing or increased cough or sputum production. He denies presyncope or syncope. He denies chest pain. He denies abdominal pain, nausea,  vomiting or diarrhea. He does have some mild erectile dysfunction and takes cialis. He denies blood in stool or urine. He denies significant leg swelling there is no thyroid issues. He is unaware of diabetes. He denies neurologic symptoms. Other comprehensive 14 point system review is negative.  PE BP 158/90  Pulse 66  Ht 5\' 10"  (1.778 m)  Wt 259 lb 11.2 oz (117.799 kg)  BMI 37.26 kg/m2  Repeat blood pressure by me was improved and was 138/90 General: Alert, oriented, no distress.  Skin: normal turgor, no rashes HEENT: Normocephalic, atraumatic. Pupils round and reactive; sclera anicteric;no lid lag. No xanthelasmas Nose without nasal septal hypertrophy Mouth/Parynx benign; Mallinpatti scale 3 Neck: No JVD, no carotid briuts Lungs: clear to ausculatation and percussion; no wheezing or rales wall tenderness to palpation Heart: RRR, s1 s2 normal 1/6 systolic murmur. Abdomen: Mild central adiposity; soft, nontender; no hepatosplenomehaly, BS+; abdominal aorta nontender and not dilated by palpation. Back no CVA tenderness Pulses 2+; right groin catheterization site stable without ecchymosis or  hematoma or bruit. Extremities: no clubbing cyanosis or edema, Homan's sign negative  Neurologic: grossly nonfocal; cranial nerves normal Psychologic: normal affect and mood.  ECG (independently read by me): Normal sinus rhythm at 66 beats per minute. PR interval 160 ms. QTC intervals 34 ms. No significant ST-T changes.  Prior ECG from November 2014 :Normal sinus rhythm at 69 beats per minute. No ectopy. Normal intervals.  LABS:  BMET    Component Value Date/Time   NA 140 09/04/2013 1028   NA 144 06/10/2013 0827   K 4.3 09/04/2013 1028   CL 104 09/04/2013 1028   CO2 28 09/04/2013 1028   GLUCOSE 91 09/04/2013 1028   GLUCOSE 95 06/10/2013 0827   BUN 20 09/04/2013 1028   BUN 15 06/10/2013 0827   CREATININE 0.98 09/04/2013 1028   CREATININE 0.79 06/10/2013 0827   CALCIUM 9.7 09/04/2013 1028   GFRNONAA 95 06/10/2013 0827   GFRAA 110 06/10/2013 0827     Hepatic Function Panel     Component Value Date/Time   PROT 6.5 09/04/2013 1028   PROT 7.0 12/12/2012 1649   ALBUMIN 4.4 09/04/2013 1028   AST 18 09/04/2013 1028   ALT 22 09/04/2013 1028   ALKPHOS 69 09/04/2013 1028   BILITOT 0.6 09/04/2013 1028     CBC    Component Value Date/Time   WBC 9.3 09/04/2013 1028   RBC 5.50 09/04/2013 1028   HGB 16.4 09/04/2013 1028   HCT 47.5 09/04/2013 1028   PLT 200 09/04/2013 1028   MCV 86.4 09/04/2013 1028   MCH 29.8 09/04/2013 1028   MCHC 34.5 09/04/2013 1028   RDW 13.4 09/04/2013 1028   LYMPHSABS 2.2 03/03/2011 1050   MONOABS 0.8 03/03/2011 1050   EOSABS 0.4 03/03/2011 1050   BASOSABS 0.1 03/03/2011 1050     BNP No results found for this basename: probnp    Lipid Panel     Component Value Date/Time   TRIG 253* 07/22/2013 0835   TRIG 291* 06/10/2013 0827   HDL 34*  06/10/2013 0827   CHOLHDL 5.8* 06/10/2013 0827   LDLCALC 102* 07/22/2013 0835   LDLCALC 104* 06/10/2013 0827     RADIOLOGY: No results found.    ASSESSMENT AND PLAN:  My impression is that Anthony [name] is a 63 year old  gentleman who has a history of moderate obesity, hyperlipidemia, hypertension, and GERD. Because of recurrent episodes of chest pain which did seem to be precipitated by activity definitive cardiac catheterization was done several weeks ago. This revealed normal coronary arteries. His blood pressure today has improved on his current medical regimen but I will further titrate his amlodipine to 7.5 mg daily. He will continue to take his benazepril 40 mg as well as hydrochlorothiazide.  He is able to tolerate Zetia for his hyperlipidemia but cannot take statin in the past had documented CPK elevation. Because of his increased triglycerides he is on lovaza 2 capsules daily;  if triglycerides continue to be elevated this will need to be increased to 2 capsules twice a day or he may require addition of fenofibrate. Since 2011 he is gained from 231 pounds to 259 pounds which is an 18 pound weight gain. I reviewed with him his moderate obesity and his "obesity threshold "begins at a weight of 212. Now that we know that he has documented normal epicardial coronary arteries, I have recommended increased exercise program in addition to his diet for more optimal weight loss. In retrospect, he believes some of the chest pain may have been stress mediated with the recent death of his 56 year old brother. I will see him in 6 months for followup Cardiologic evaluation or sooner if problems arise.   Troy Sine, MD, [facility]  09/23/2013 10:59 AM

## 2013-09-23 NOTE — Telephone Encounter (Signed)
Left message that Dr. Claiborne Billings decided after he left to increase the amlodipine to 1& 1/2 tablet daily to equal 7.5 mg. Prescription has been sent to [facility] listed on his profile. Call back if questions.

## 2013-09-26 ENCOUNTER — Encounter: Payer: Self-pay | Admitting: Cardiovascular Disease

## 2013-10-07 ENCOUNTER — Other Ambulatory Visit: Payer: Medicare Other

## 2013-10-09 ENCOUNTER — Ambulatory Visit: Payer: Medicare Other | Admitting: Internal Medicine

## 2013-10-11 ENCOUNTER — Encounter: Payer: Self-pay | Admitting: Internal Medicine

## 2013-10-11 ENCOUNTER — Other Ambulatory Visit: Payer: Self-pay

## 2013-10-11 MED ORDER — METHOCARBAMOL 500 MG PO TABS: 500.0000 mg | ORAL_TABLET | Freq: Four times a day (QID) | ORAL | Status: DC

## 2013-10-11 NOTE — Telephone Encounter (Signed)
Per patient's mychart request rx sent for 30 day supply to [facility] (Methocarbamol 500 MG)

## 2013-10-30 ENCOUNTER — Encounter: Payer: Self-pay | Admitting: Internal Medicine

## 2013-10-30 ENCOUNTER — Ambulatory Visit (INDEPENDENT_AMBULATORY_CARE_PROVIDER_SITE_OTHER): Payer: Commercial Managed Care - HMO | Admitting: Internal Medicine

## 2013-10-30 VITALS — BP 120/70 | HR 77 | Temp 98.0°F | Resp 20 | Ht 70.0 in | Wt 252.0 lb

## 2013-10-30 DIAGNOSIS — N401 Enlarged prostate with lower urinary tract symptoms: Secondary | ICD-10-CM

## 2013-10-30 DIAGNOSIS — E669 Obesity, unspecified: Secondary | ICD-10-CM

## 2013-10-30 DIAGNOSIS — E785 Hyperlipidemia, unspecified: Secondary | ICD-10-CM

## 2013-10-30 DIAGNOSIS — B351 Tinea unguium: Secondary | ICD-10-CM

## 2013-10-30 DIAGNOSIS — H9319 Tinnitus, unspecified ear: Secondary | ICD-10-CM

## 2013-10-30 DIAGNOSIS — N138 Other obstructive and reflux uropathy: Secondary | ICD-10-CM

## 2013-10-30 DIAGNOSIS — I10 Essential (primary) hypertension: Secondary | ICD-10-CM

## 2013-10-30 DIAGNOSIS — R972 Elevated prostate specific antigen [PSA]: Secondary | ICD-10-CM

## 2013-10-30 DIAGNOSIS — M542 Cervicalgia: Secondary | ICD-10-CM

## 2013-10-30 MED ORDER — OMEGA-3-ACID ETHYL ESTERS 1 G PO CAPS: ORAL_CAPSULE | ORAL | Status: DC

## 2013-10-30 MED ORDER — AMLODIPINE BESYLATE 5 MG PO TABS: ORAL_TABLET | ORAL | Status: DC

## 2013-10-30 MED ORDER — EZETIMIBE 10 MG PO TABS: ORAL_TABLET | ORAL | Status: DC

## 2013-10-30 MED ORDER — FINASTERIDE 5 MG PO TABS: ORAL_TABLET | ORAL | Status: DC

## 2013-10-30 MED ORDER — MICONAZOLE NITRATE 2 % EX SOLN: CUTANEOUS | Status: DC

## 2013-10-30 MED ORDER — HYDROCHLOROTHIAZIDE 25 MG PO TABS: ORAL_TABLET | ORAL | Status: DC

## 2013-10-30 MED ORDER — BENAZEPRIL HCL 40 MG PO TABS: ORAL_TABLET | ORAL | Status: DC

## 2013-10-30 NOTE — Progress Notes (Signed)
Patient ID: Anthony [name], male   DOB: 07-21-1951, 63 y.o.   MRN: [id number]    Location:    [facility]  Place of Service:  OFFICE  PCP: Estill Dooms, MD  Code Status: FULL  Extended Emergency Contact Information Primary Emergency Contact: Holden,Gloria J Address: [address]          [place], [place] [address] Johnnette Litter of [place] Phone: [phone] Mobile Phone: [phone] Relation: Spouse  Allergies  Allergen Reactions  . Codeine     Headache  . Latex   . Lipitor [Atorvastatin]     Leg pain/cramps  . Red Yeast Rice [Cholestin]     Makes legs hurt  . Statins     "muscle break down"    Chief Complaint  Patient presents with  . Annual Exam    HPI:  Has had cardiac evaluations recently with Stress Test and 2D echo. Tests were normal. He continues to see cardiologist, Dr. Claiborne Billings.  Onychomycosis - right great toenail with associated onycholysis  Cervicalgia: chronic and unchanged  Elevated prostate specific antigen (PSA) - needs recheck of PSA  Hyperlipidemia - better  Hypertrophy of prostate with urinary obstruction and other lower urinary tract symptoms (LUTS): some hesitation and stranguria. No incontinence.  Hypertension - controlled  Tinnitus: onset several months ago. Having mildly diminished hearing bilaterally.  Obesity (BMI 30-39.9): has lost a few pounds since last seen. Enrolled in Gym classes with a private trainer   Past Medical History  Diagnosis Date  . Lumbago   . Elevated prostate specific antigen (PSA)   . Benign neoplasm of skin of upper limb, including shoulder   . Unspecified tinnitus   . Other seborrheic keratosis   . Osteoarthrosis, unspecified whether generalized or localized, lower leg   . Hypertrophy of prostate with urinary obstruction and other lower urinary tract symptoms (LUTS)   . Herpes zoster without mention of complication   . Carpal tunnel syndrome   . Impotence of organic origin   . Benign neoplasm of colon   .  Other and unspecified hyperlipidemia   . Hypertension   . Special screening for malignant neoplasm of prostate   . Diaphragmatic hernia without mention of obstruction or gangrene   . GERD (gastroesophageal reflux disease)   . Obesity, unspecified   . Other malaise and fatigue   . Hypertension   . Dyslipidemia     Past Surgical History  Procedure Laterality Date  . Inguinal hernia repair  1984    left  . Cholecystectomy  1992  . Eye surgery    . Retinal detachment surgery  2001    right  . Eyeband  2006    removal of right eyeband  . Intraocular lens removal  2008    right  . Total knee arthroplasty  2011, 2012    bilateral  . Total hip arthroplasty  2012    bilateral  . Lower venous extremity doppler  01/26/2011    No evidence of thrombus or thrombophlebitis.  . Cardiovascular stress test  06/15/2012    Non-diagnostic for ischemia. No lexiscan EKG changes.  . Transthoracic echocardiogram  10/05/2009    EF >55%, normal LV size, systolic, and diastolic function.    CONSULTANTS Gi: [facility] Urol: [facility] Ophth: [facility]: Marianne SofiaTonita Cong  PAST PROCEDURES 02/1995- UGI Hiatal Hernia  IVP BaE Normal  1990 -UGI/ US Gallbladder: Hiatal Hernia  05/1989- Flex Sig Internal Hemorrhoids  11/1995- MRI of the brain Normal  08/97- UGI  Hiatal Hernia question ulcer  1999 -US of the Abdomen  EGD- with biopsy Dr. Cristina Gong CLO positive  11/112003 -Chest X-Ray Normal  11/06/2002- Stress Test  03/30/2006- MRI of the Right Knee Degenerative joint decease tear posterior horn medial meniscus. No fractures. Referred to Dr. Maxie Better  11/07/2006- Colonoscopy 73mm polyp in hepatic flexure.- Dr. Herbie Baltimore [facility]  10/03/2008-Renal Ultrasound: Sonographic appearance of both kidneys is within normal limits. 08/05/2009-Chest X-Ray: Stable chest x-ray. No active lung disease. 10/05/2009-Echocardiogram: Normal left ventricular size and systolic and diastolic function. mildly dilated left atrium.  Right heart chambers are normal in size and function. no significant valvular abnormalities are seen. no pericardial effusion. 10/05/2009-Stress Test: The post stress myocardial perfusion images show a normal pattern of perfusion in all regions. the post stress left ventricle is normal is size. there is no scintigraphic evidence of inducible myocardial ischemia. The post stress ejection fraction is 56%. Global left ventricular systolic function is normal. No significant wall motion abnormalities noted. EKG is negative for ischemia. no ECG changes. No significant ischemia demonstrated. this is a low risk scan. no prior study available for comparison. normal myocardial perfusion imaging. 11/16/2010-EKG-[facility] 11/30/2010-Stress Test: the post stress myocardial perfusion images show a normal pattern of perfusion in all regions. the post stress left ventricle is normal in size. there is no scintigraphic evidence of inducible myocardial ischemia. Mild diaphragmatic attenuation is seen primarily on rest images. The post stress ejection fraction is 55%. Global left ventricular systolic function is normal. No significant wall motion abnormalities noted. No ECG changes. Compared to previous full time acquisition study, results are unchanged and within normal variance. Normal myocardial perfusion study. This is a low risk scan.-Dr. Shelva Majestic 05/08/12 Colonoscopy ([facility]): sessile polyp 06/15/12 Myoview ETT: normal 08/29/13 Myoview: normal 09/04/13 2D echo: normal  Social History: History   Social History  . Marital Status: Married    Spouse Name: N/A    Number of Children: N/A  . Years of Education: N/A   Social History Main Topics  . Smoking status: Never Smoker   . Smokeless tobacco: None  . Alcohol Use: No  . Drug Use: No  . Sexual Activity: Yes    Partners: Female     Comment: wife   Other Topics Concern  . None   Social History Narrative  . None    Family  History Family Status  Relation Status Death Age  . Mother Alive   . Father Deceased 52  . Sister Deceased 88  . Sister Alive   . Brother Deceased     Pacemaker  . Daughter Alive   . Maternal Grandmother Deceased 57  . Maternal Grandfather Deceased 35  . Paternal Grandfather Deceased 61  . Son Alive   . Paternal Grandmother Deceased 13   Family History  Problem Relation Age of Onset  . Diabetes Mother   . Arrhythmia Mother   . Hypertension Mother   . Dementia Father   . Pulmonary embolism Father   . Hypertension Father   . Cancer Sister     Liver cancer - Histocytic lymphoma  . Cancer Sister 14    Skin cancer  . Hypertension Sister 53  . Diabetes Sister   . Stroke Brother   . Diabetes Brother   . Hypertension Brother   . Cancer Daughter   . Cancer Maternal Grandmother     Skin cancer  . Heart attack Maternal Grandfather   . Cancer Paternal Grandfather   . Hypertension Paternal Grandfather  Medications: Patient's Medications  New Prescriptions   MICONAZOLE NITRATE 2 % SOLN    Apply twice daily to affected nail  Previous Medications   ASPIRIN 81 MG TABLET    Take 81 mg by mouth daily. Take one tablet once daily for heart health.   ESOMEPRAZOLE (NEXIUM) 40 MG CAPSULE    Take 1 capsule (40 mg total) by mouth daily before breakfast. For reflux   HYDROCORTISONE CREAM 1 %       LATANOPROST (XALATAN) 0.005 % OPHTHALMIC SOLUTION    1 drop at bedtime. One drop once daily into the right eye.   LORATADINE 10 MG CAPS       METHOCARBAMOL (ROBAXIN) 500 MG TABLET    Take 1 tablet (500 mg total) by mouth every 6 (six) hours. APPOINTMENT DUE, 1 by mouth every 6 hours as needed   NITROGLYCERIN (NITROSTAT) 0.4 MG SL TABLET    Place 1 tablet (0.4 mg total) under the tongue every 5 (five) minutes as needed for chest pain.   POLYETHYLENE GLYCOL 3350 GRAN      Modified Medications   Modified Medication Previous Medication   AMLODIPINE (NORVASC) 5 MG TABLET amLODipine (NORVASC) 5  MG tablet      Take 1 & 1/2 tablet by mouth once daily to control blood pressure    Take 1 & 1/2 tablet daily.   BENAZEPRIL (LOTENSIN) 40 MG TABLET benazepril (LOTENSIN) 40 MG tablet      Take one tablet by mouth once daily to control blood pressure    Take 1 tablet (40 mg total) by mouth daily.   EZETIMIBE (ZETIA) 10 MG TABLET ezetimibe (ZETIA) 10 MG tablet      Take one tablet by mouth once daily for cholesterol    Take 1 tablet (10 mg total) by mouth daily. For cholesterol   FINASTERIDE (PROSCAR) 5 MG TABLET finasteride (PROSCAR) 5 MG tablet      Take one tablet by mouth once daily for prostate    Take 5 mg by mouth daily.   HYDROCHLOROTHIAZIDE (HYDRODIURIL) 25 MG TABLET hydrochlorothiazide (HYDRODIURIL) 25 MG tablet      Take one tablet by mouth once daily to control blood pressure    Take 1 tablet (25 mg total) by mouth daily. For blood pressure   OMEGA-3 ACID ETHYL ESTERS (LOVAZA) 1 G CAPSULE omega-3 acid ethyl esters (LOVAZA) 1 G capsule      Take one capsule by mouth twice daily for cholesterol    Take 1 capsule (1 g total) by mouth 2 (two) times daily.  Discontinued Medications   No medications on file    Immunization History  Administered Date(s) Administered  . Influenza,inj,Quad PF,36+ Mos 06/11/2013  . Pneumococcal Conjugate-13 06/27/2003  . Tdap 08/30/2011     Review of Systems  Constitutional: Negative for activity change and appetite change.       Despite much encouragement, he has not been successful in weight loss.  Eyes: Negative.   Respiratory: Positive for cough.        Persistent dry cough. Responds to Mucinex DM and does not seem to get any worse at meals or swallowing liquids.  Cardiovascular: Negative for chest pain, palpitations and leg swelling.  Gastrointestinal: Negative for nausea, diarrhea, constipation, blood in stool and abdominal distention.  Endocrine: Negative.   Genitourinary: Negative.   Musculoskeletal: Positive for arthralgias and back pain.        Low back discomfort. Hip pains. Neck pains.  Neurological: Positive for headaches.  Hematological: Negative.   Psychiatric/Behavioral: Negative.       Filed Vitals:   10/30/13 1251  BP: 120/70  Pulse: 77  Temp: 98 F (36.7 C)  TempSrc: Oral  Resp: 20  Height: 5\' 10"  (1.778 m)  Weight: 252 lb (114.306 kg)  SpO2: 96%   Physical Exam  Constitutional: He is oriented to person, place, and time.  obese  HENT:  Head: Normocephalic and atraumatic.  Right Ear: External ear normal.  Left Ear: External ear normal.  Nose: Nose normal.  Mouth/Throat: Oropharynx is clear and moist. No oropharyngeal exudate.  Eyes: Conjunctivae and EOM are normal. Pupils are equal, round, and reactive to light. Right eye exhibits no discharge. Left eye exhibits no discharge.  Neck: No JVD present. No tracheal deviation present. No thyromegaly present.  Cardiovascular: Normal rate, regular rhythm, normal heart sounds and intact distal pulses.  Exam reveals no gallop and no friction rub.   No murmur heard. Respiratory: No respiratory distress. He has no wheezes. He has no rales. He exhibits no tenderness.  GI: He exhibits no distension and no mass. There is no tenderness. There is no rebound and no guarding.  Genitourinary: Rectum normal, prostate normal and penis normal. Guaiac negative stool. No penile tenderness.  Musculoskeletal: Normal range of motion. He exhibits no edema and no tenderness.  Lymphadenopathy:    He has no cervical adenopathy.  Neurological: He is alert and oriented to person, place, and time. No cranial nerve deficit.  Skin: No rash noted. No erythema. No pallor.  Psychiatric: He has a normal mood and affect. His behavior is normal. Judgment and thought content normal.       Labs reviewed: Office Visit on 09/04/2013  Component Date Value Ref Range Status  . Sodium 09/04/2013 140  135 - 145 mEq/L Final  . Potassium 09/04/2013 4.3  3.5 - 5.3 mEq/L Final  . Chloride  09/04/2013 104  96 - 112 mEq/L Final  . CO2 09/04/2013 28  19 - 32 mEq/L Final  . Glucose, Bld 09/04/2013 91  70 - 99 mg/dL Final  . BUN 09/04/2013 20  6 - 23 mg/dL Final  . Creat 09/04/2013 0.98  0.50 - 1.35 mg/dL Final  . Total Bilirubin 09/04/2013 0.6  0.3 - 1.2 mg/dL Final  . Alkaline Phosphatase 09/04/2013 69  39 - 117 U/L Final  . AST 09/04/2013 18  0 - 37 U/L Final  . ALT 09/04/2013 22  0 - 53 U/L Final  . Total Protein 09/04/2013 6.5  6.0 - 8.3 g/dL Final  . Albumin 09/04/2013 4.4  3.5 - 5.2 g/dL Final  . Calcium 09/04/2013 9.7  8.4 - 10.5 mg/dL Final  . WBC 09/04/2013 9.3  4.0 - 10.5 K/uL Final  . RBC 09/04/2013 5.50  4.22 - 5.81 MIL/uL Final  . Hemoglobin 09/04/2013 16.4  13.0 - 17.0 g/dL Final  . HCT 09/04/2013 47.5  39.0 - 52.0 % Final  . MCV 09/04/2013 86.4  78.0 - 100.0 fL Final  . MCH 09/04/2013 29.8  26.0 - 34.0 pg Final  . MCHC 09/04/2013 34.5  30.0 - 36.0 g/dL Final  . RDW 09/04/2013 13.4  11.5 - 15.5 % Final  . Platelets 09/04/2013 200  150 - 400 K/uL Final  . Prothrombin Time 09/04/2013 12.6  11.6 - 15.2 seconds Final  . INR 09/04/2013 0.95  <1.50 Final   Comment: The INR is of principal utility in following patients on stable doses  of oral anticoagulants.  The therapeutic range is generally 2.0 to                          3.0, but may be 3.0 to 4.0 in patients with mechanical cardiac valves,                          recurrent embolisms and antiphospholipid antibodies (including lupus                          inhibitors).  Marland Kitchen aPTT 09/04/2013 29  24 - 37 seconds Final   Comment: This test is for screening purposes only; it should not be used for                          therapeutic unfractionated heparin monitoring.  Please refer to                          Heparin Anti-Xa [id number]).     Assessment/Plan 1. Onychomycosis - Miconazole Nitrate 2 % SOLN; Apply twice daily to affected nail  Dispense: 30 mL; Refill: 3  2.  Cervicalgia observe  3. Elevated prostate specific antigen (PSA) - PSA  4. Hyperlipidemia - Lipid panel; Future  5. Hypertrophy of prostate with urinary obstruction and other lower urinary tract symptoms (LUTS) No new medications at this time  6. Hypertension - CMP; Future  7. Tinnitus Consider audiology referral. He does not feel this is necessary at this time.  8. Obesity (BMI 30-39.9) continue with fitness training and diet.

## 2013-10-30 NOTE — Patient Instructions (Signed)
Continue current medication.

## 2013-10-31 LAB — PSA: PSA: 1.9 ng/mL (ref 0.0–4.0)

## 2013-11-03 ENCOUNTER — Emergency Department (HOSPITAL_COMMUNITY)
Admission: EM | Admit: 2013-11-03 | Discharge: 2013-11-04 | Disposition: A | Discharge status: Home / Self Care | Payer: Medicare HMO | Attending: Emergency Medicine | Admitting: Emergency Medicine

## 2013-11-03 ENCOUNTER — Encounter (HOSPITAL_COMMUNITY): Payer: Self-pay | Admitting: Emergency Medicine

## 2013-11-03 ENCOUNTER — Emergency Department (HOSPITAL_COMMUNITY): Payer: Medicare HMO

## 2013-11-03 DIAGNOSIS — S76911A Strain of unspecified muscles, fascia and tendons at thigh level, right thigh, initial encounter: Secondary | ICD-10-CM

## 2013-11-03 DIAGNOSIS — Y9389 Activity, other specified: Secondary | ICD-10-CM | POA: Insufficient documentation

## 2013-11-03 DIAGNOSIS — E669 Obesity, unspecified: Secondary | ICD-10-CM | POA: Insufficient documentation

## 2013-11-03 DIAGNOSIS — IMO0002 Reserved for concepts with insufficient information to code with codable children: Secondary | ICD-10-CM | POA: Insufficient documentation

## 2013-11-03 DIAGNOSIS — Z8619 Personal history of other infectious and parasitic diseases: Secondary | ICD-10-CM | POA: Insufficient documentation

## 2013-11-03 DIAGNOSIS — W19XXXA Unspecified fall, initial encounter: Secondary | ICD-10-CM

## 2013-11-03 DIAGNOSIS — Y929 Unspecified place or not applicable: Secondary | ICD-10-CM | POA: Insufficient documentation

## 2013-11-03 DIAGNOSIS — S3981XA Other specified injuries of abdomen, initial encounter: Secondary | ICD-10-CM | POA: Insufficient documentation

## 2013-11-03 DIAGNOSIS — M1710 Unilateral primary osteoarthritis, unspecified knee: Secondary | ICD-10-CM | POA: Insufficient documentation

## 2013-11-03 DIAGNOSIS — Z860100 Personal history of colon polyps, unspecified: Secondary | ICD-10-CM | POA: Insufficient documentation

## 2013-11-03 DIAGNOSIS — K219 Gastro-esophageal reflux disease without esophagitis: Secondary | ICD-10-CM | POA: Insufficient documentation

## 2013-11-03 DIAGNOSIS — Z872 Personal history of diseases of the skin and subcutaneous tissue: Secondary | ICD-10-CM | POA: Insufficient documentation

## 2013-11-03 DIAGNOSIS — Z8669 Personal history of other diseases of the nervous system and sense organs: Secondary | ICD-10-CM | POA: Insufficient documentation

## 2013-11-03 DIAGNOSIS — N138 Other obstructive and reflux uropathy: Secondary | ICD-10-CM | POA: Insufficient documentation

## 2013-11-03 DIAGNOSIS — E278 Other specified disorders of adrenal gland: Secondary | ICD-10-CM | POA: Insufficient documentation

## 2013-11-03 DIAGNOSIS — I10 Essential (primary) hypertension: Secondary | ICD-10-CM | POA: Insufficient documentation

## 2013-11-03 DIAGNOSIS — Z79899 Other long term (current) drug therapy: Secondary | ICD-10-CM | POA: Insufficient documentation

## 2013-11-03 DIAGNOSIS — S298XXA Other specified injuries of thorax, initial encounter: Secondary | ICD-10-CM | POA: Insufficient documentation

## 2013-11-03 DIAGNOSIS — N401 Enlarged prostate with lower urinary tract symptoms: Secondary | ICD-10-CM | POA: Insufficient documentation

## 2013-11-03 DIAGNOSIS — Z91040 Latex allergy status: Secondary | ICD-10-CM | POA: Insufficient documentation

## 2013-11-03 DIAGNOSIS — Z9089 Acquired absence of other organs: Secondary | ICD-10-CM | POA: Insufficient documentation

## 2013-11-03 DIAGNOSIS — W11XXXA Fall on and from ladder, initial encounter: Secondary | ICD-10-CM | POA: Insufficient documentation

## 2013-11-03 LAB — I-STAT CHEM 8, ED
BUN: 30 mg/dL — AB (ref 6–23)
CALCIUM ION: 1.22 mmol/L (ref 1.13–1.30)
Chloride: 105 mEq/L (ref 96–112)
Creatinine, Ser: 1.5 mg/dL — ABNORMAL HIGH (ref 0.50–1.35)
GLUCOSE: 119 mg/dL — AB (ref 70–99)
HCT: 41 % (ref 39.0–52.0)
Hemoglobin: 13.9 g/dL (ref 13.0–17.0)
Potassium: 3.6 mEq/L — ABNORMAL LOW (ref 3.7–5.3)
Sodium: 142 mEq/L (ref 137–147)
TCO2: 23 mmol/L (ref 0–100)

## 2013-11-03 LAB — COMPREHENSIVE METABOLIC PANEL
ALK PHOS: 67 U/L (ref 39–117)
ALT: 27 U/L (ref 0–53)
AST: 36 U/L (ref 0–37)
Albumin: 4.2 g/dL (ref 3.5–5.2)
BUN: 31 mg/dL — ABNORMAL HIGH (ref 6–23)
CALCIUM: 9.6 mg/dL (ref 8.4–10.5)
CO2: 22 meq/L (ref 19–32)
Chloride: 102 mEq/L (ref 96–112)
Creatinine, Ser: 1.33 mg/dL (ref 0.50–1.35)
GFR calc Af Amer: 64 mL/min — ABNORMAL LOW (ref >90)
GFR calc non Af Amer: 55 mL/min — ABNORMAL LOW (ref >90)
Glucose, Bld: 123 mg/dL — ABNORMAL HIGH (ref 70–99)
POTASSIUM: 3.9 meq/L (ref 3.7–5.3)
SODIUM: 139 meq/L (ref 137–147)
Total Bilirubin: 0.3 mg/dL (ref 0.3–1.2)
Total Protein: 6.4 g/dL (ref 6.0–8.3)

## 2013-11-03 MED ORDER — FENTANYL CITRATE 0.05 MG/ML IJ SOLN
100.0000 ug | Freq: Once | INTRAMUSCULAR | Status: AC
  Administered 2013-11-03: 100 ug via INTRAVENOUS
  Filled 2013-11-03: qty 2

## 2013-11-03 MED ORDER — SODIUM CHLORIDE 0.9 % IV BOLUS (SEPSIS)
1000.0000 mL | Freq: Once | INTRAVENOUS | Status: AC
  Administered 2013-11-03: 1000 mL via INTRAVENOUS

## 2013-11-03 MED ORDER — HYDROMORPHONE HCL PF 1 MG/ML IJ SOLN
1.0000 mg | Freq: Once | INTRAMUSCULAR | Status: AC
  Administered 2013-11-03: 1 mg via INTRAVENOUS
  Filled 2013-11-03: qty 1

## 2013-11-03 NOTE — ED Notes (Signed)
Pt states that at approx. 4 he fell from a ladder approx. 8-10 ft landing on his R side and back. Has had multiple joint replacements including R hip. C/O low back pain, hip pain.

## 2013-11-03 NOTE — ED Provider Notes (Signed)
CSN: [id number]     Arrival date & time 11/03/13  2155 History   First MD Initiated Contact with Patient 11/03/13 2201     Chief Complaint  Patient presents with  . Fall  . Trauma     (Consider location/radiation/quality/duration/timing/severity/associated sxs/prior Treatment) HPI Comments: 63 year old male presents 3-4 hours after falling off a ladder. He was cutting down a limb approximately 8-10 feet up and the limb bounced back and knocked over his ladder. He landed on his right side. He primarily has low back pain and right hip/groin pain. There is some right sided chest and abdominal pain. He did not get knocked out. Did not hit his head or neck. No numbness or tingling. His right leg was "giving out" while he tried to bear weight on it but states there is no weakness. He feels the pain is too bad for him to stand and his leg doesn't feel stable, but not that his leg is weak. No incontinence.    Past Medical History  Diagnosis Date  . Lumbago   . Elevated prostate specific antigen (PSA)   . Benign neoplasm of skin of upper limb, including shoulder   . Unspecified tinnitus   . Other seborrheic keratosis   . Osteoarthrosis, unspecified whether generalized or localized, lower leg   . Hypertrophy of prostate with urinary obstruction and other lower urinary tract symptoms (LUTS)   . Herpes zoster without mention of complication   . Carpal tunnel syndrome   . Impotence of organic origin   . Benign neoplasm of colon   . Other and unspecified hyperlipidemia   . Hypertension   . Special screening for malignant neoplasm of prostate   . Diaphragmatic hernia without mention of obstruction or gangrene   . GERD (gastroesophageal reflux disease)   . Obesity, unspecified   . Other malaise and fatigue   . Hypertension   . Dyslipidemia    Past Surgical History  Procedure Laterality Date  . Inguinal hernia repair  1984    left  . Cholecystectomy  1992  . Eye surgery    . Retinal  detachment surgery  2001    right  . Eyeband  2006    removal of right eyeband  . Intraocular lens removal  2008    right  . Total knee arthroplasty  2011, 2012    bilateral  . Total hip arthroplasty  2012    bilateral  . Lower venous extremity doppler  01/26/2011    No evidence of thrombus or thrombophlebitis.  . Cardiovascular stress test  06/15/2012    Non-diagnostic for ischemia. No lexiscan EKG changes.  . Transthoracic echocardiogram  10/05/2009    EF >55%, normal LV size, systolic, and diastolic function.   Family History  Problem Relation Age of Onset  . Diabetes Mother   . Arrhythmia Mother   . Hypertension Mother   . Dementia Father   . Pulmonary embolism Father   . Hypertension Father   . Cancer Sister     Liver cancer - Histocytic lymphoma  . Cancer Sister 72    Skin cancer  . Hypertension Sister 72  . Diabetes Sister   . Stroke Brother   . Diabetes Brother   . Hypertension Brother   . Cancer Daughter   . Cancer Maternal Grandmother     Skin cancer  . Heart attack Maternal Grandfather   . Cancer Paternal Grandfather   . Hypertension Paternal Grandfather    History  Substance Use  Topics  . Smoking status: Never Smoker   . Smokeless tobacco: Not on file  . Alcohol Use: No    Review of Systems  Respiratory: Negative for shortness of breath.   Cardiovascular: Positive for chest pain.  Gastrointestinal: Positive for abdominal pain.  Musculoskeletal: Positive for back pain. Negative for neck pain.  Neurological: Negative for syncope, weakness, numbness and headaches.  All other systems reviewed and are negative.      Allergies  Codeine; Latex; Lipitor; Red yeast rice; and Statins  Home Medications   Current Outpatient Rx  Name  Route  Sig  Dispense  Refill  . amLODipine (NORVASC) 5 MG tablet      Take 1 & 1/2 tablet by mouth once daily to control blood pressure   135 tablet   3   . benazepril (LOTENSIN) 40 MG tablet      Take one tablet by  mouth once daily to control blood pressure   90 tablet   3   . esomeprazole (NEXIUM) 40 MG capsule   Oral   Take 1 capsule (40 mg total) by mouth daily before breakfast. For reflux   90 capsule   3   . ezetimibe (ZETIA) 10 MG tablet      Take one tablet by mouth once daily for cholesterol   90 tablet   3   . finasteride (PROSCAR) 5 MG tablet      Take one tablet by mouth once daily for prostate   90 tablet   3   . hydrochlorothiazide (HYDRODIURIL) 25 MG tablet      Take one tablet by mouth once daily to control blood pressure   90 tablet   3   . hydrocortisone cream 1 %               . latanoprost (XALATAN) 0.005 % ophthalmic solution      1 drop at bedtime. One drop once daily into the right eye.         . Loratadine 10 MG CAPS               . methocarbamol (ROBAXIN) 500 MG tablet   Oral   Take 1 tablet (500 mg total) by mouth every 6 (six) hours. APPOINTMENT DUE, 1 by mouth every 6 hours as needed   120 tablet   0   . Miconazole Nitrate 2 % SOLN      Apply twice daily to affected nail   30 mL   3   . nitroGLYCERIN (NITROSTAT) 0.4 MG SL tablet   Sublingual   Place 1 tablet (0.4 mg total) under the tongue every 5 (five) minutes as needed for chest pain.   25 tablet   2   . omega-3 acid ethyl esters (LOVAZA) 1 G capsule      Take one capsule by mouth twice daily for cholesterol   180 capsule   3   . Polyethylene Glycol 3350 GRAN                BP 151/76  Pulse 95  Temp(Src) 98.2 F (36.8 C) (Oral)  SpO2 99% Physical Exam  Nursing note and vitals reviewed. Constitutional: He is oriented to person, place, and time. He appears well-developed and well-nourished.  HENT:  Head: Normocephalic and atraumatic.  Right Ear: External ear normal.  Left Ear: External ear normal.  Nose: Nose normal.  Eyes: Right eye exhibits no discharge. Left eye exhibits no discharge.  Neck: Neck supple.  Cardiovascular: Normal rate, regular rhythm, normal  heart sounds and intact distal pulses.   Pulmonary/Chest: Effort normal and breath sounds normal. He exhibits tenderness (right sided chest wall tenderness).  Abdominal: Soft. There is tenderness (mild RLQ and mid right tenderness).  Musculoskeletal: He exhibits no edema.       Right hip: He exhibits tenderness.       Thoracic back: He exhibits tenderness.       Lumbar back: He exhibits tenderness.       Right upper leg: He exhibits tenderness.       Legs: Neurological: He is alert and oriented to person, place, and time.  Mildly decreased strength in RLE that seems pain related compared to the left.  Skin: Skin is warm and dry.    ED Course  Procedures (including critical care time) Labs Review Labs Reviewed  CBC WITH DIFFERENTIAL - Abnormal; Notable for the following:    WBC 15.3 (*)    All other components within normal limits  COMPREHENSIVE METABOLIC PANEL - Abnormal; Notable for the following:    Glucose, Bld 123 (*)    BUN 31 (*)    GFR calc non Af Amer 55 (*)    GFR calc Af Amer 64 (*)    All other components within normal limits  I-STAT CHEM 8, ED - Abnormal; Notable for the following:    Potassium 3.6 (*)    BUN 30 (*)    Creatinine, Ser 1.50 (*)    Glucose, Bld 119 (*)    All other components within normal limits  URINALYSIS, ROUTINE W REFLEX MICROSCOPIC   Imaging Review Dg Pelvis Portable  11/03/2013   CLINICAL DATA:  Status post fall.  Right hip pain.  EXAM: PORTABLE PELVIS 1-2 VIEWS  COMPARISON:  Single view of the pelvis 03/10/2011.  FINDINGS: Bilateral total hip arthroplasties are in place. Both devices are located. There is no fracture.  IMPRESSION: No acute finding.  Stable compared to prior exam.   Electronically Signed   By: Inge Rise M.D.   On: 11/03/2013 22:39   Dg Chest Portable 1 View  11/03/2013   CLINICAL DATA:  Status post fall.  EXAM: PORTABLE CHEST - 1 VIEW  COMPARISON:  PA and lateral chest 09/04/2013.  FINDINGS: The lungs are clear. Heart  size is normal. There is no pneumothorax or pleural effusion. No bony abnormality is identified.  IMPRESSION: Negative exam.   Electronically Signed   By: Inge Rise M.D.   On: 11/03/2013 22:40     EKG Interpretation None      MDM   Final diagnoses:  Fall    CXR and pelvis xray are benign. His RLE pain improved some with pain medicine, but still has pain with ROM of hip. This seems to be localized to his right medial upper thigh, likely from a strain from fall. No bony leg tenderness or swelling. Given the other locations of his pain, will need CT scan to r/o rib fracture, intra-abd injury, and back fractures. Care transferred with CT pending. Neck cleared clinically, no pain, full ROM.    Ephraim Hamburger, MD 11/03/13 [phone]

## 2013-11-04 ENCOUNTER — Encounter (HOSPITAL_COMMUNITY): Payer: Self-pay

## 2013-11-04 LAB — CBC WITH DIFFERENTIAL/PLATELET
Basophils Absolute: 0 10*3/uL (ref 0.0–0.1)
Basophils Relative: 0 % (ref 0–1)
EOS ABS: 0.2 10*3/uL (ref 0.0–0.7)
Eosinophils Relative: 1 % (ref 0–5)
HEMATOCRIT: 40.1 % (ref 39.0–52.0)
HEMOGLOBIN: 14 g/dL (ref 13.0–17.0)
LYMPHS PCT: 8 % — AB (ref 12–46)
Lymphs Abs: 1.2 10*3/uL (ref 0.7–4.0)
MCH: 30.1 pg (ref 26.0–34.0)
MCHC: 34.9 g/dL (ref 30.0–36.0)
MCV: 86.2 fL (ref 78.0–100.0)
MONOS PCT: 8 % (ref 3–12)
Monocytes Absolute: 1.2 10*3/uL — ABNORMAL HIGH (ref 0.1–1.0)
NEUTROS ABS: 12.7 10*3/uL — AB (ref 1.7–7.7)
NEUTROS PCT: 83 % — AB (ref 43–77)
Platelets: 194 10*3/uL (ref 150–400)
RBC: 4.65 MIL/uL (ref 4.22–5.81)
RDW: 13 % (ref 11.5–15.5)
WBC MORPHOLOGY: INCREASED
WBC: 15.3 10*3/uL — AB (ref 4.0–10.5)

## 2013-11-04 LAB — URINE MICROSCOPIC-ADD ON

## 2013-11-04 LAB — URINALYSIS, ROUTINE W REFLEX MICROSCOPIC
BILIRUBIN URINE: NEGATIVE
GLUCOSE, UA: NEGATIVE mg/dL
KETONES UR: 15 mg/dL — AB
Leukocytes, UA: NEGATIVE
Nitrite: NEGATIVE
PH: 5 (ref 5.0–8.0)
Protein, ur: NEGATIVE mg/dL
Specific Gravity, Urine: 1.021 (ref 1.005–1.030)
Urobilinogen, UA: 0.2 mg/dL (ref 0.0–1.0)

## 2013-11-04 MED ORDER — OXYCODONE-ACETAMINOPHEN 5-325 MG PO TABS
2.0000 | ORAL_TABLET | Freq: Once | ORAL | Status: AC
  Administered 2013-11-04: 2 via ORAL
  Filled 2013-11-04: qty 2

## 2013-11-04 MED ORDER — OXYCODONE-ACETAMINOPHEN 5-325 MG PO TABS: 2.0000 | ORAL_TABLET | ORAL | Status: DC | PRN

## 2013-11-04 MED ORDER — ONDANSETRON 8 MG PO TBDP
8.0000 mg | ORAL_TABLET | Freq: Three times a day (TID) | ORAL | Status: DC | PRN
Start: 2013-11-04 — End: 2014-09-23

## 2013-11-04 MED ORDER — ONDANSETRON 8 MG PO TBDP
8.0000 mg | ORAL_TABLET | Freq: Once | ORAL | Status: AC
  Administered 2013-11-04: 8 mg via ORAL
  Filled 2013-11-04: qty 1

## 2013-11-04 MED ORDER — IOHEXOL 300 MG/ML  SOLN
100.0000 mL | Freq: Once | INTRAMUSCULAR | Status: AC | PRN
  Administered 2013-11-04: 100 mL via INTRAVENOUS

## 2013-11-04 NOTE — ED Notes (Signed)
Pt was able to ambulate in the hall w/ minimal assistance.

## 2013-11-04 NOTE — Discharge Instructions (Signed)
Fall Prevention and Home Safety °Falls cause injuries and can affect all age groups. It is possible to prevent falls.  °HOW TO PREVENT FALLS °· Wear shoes with rubber soles that do not have an opening for your toes. °· Keep the inside and outside of your house well lit. °· Use night lights throughout your home. °· Remove clutter from floors. °· Clean up floor spills. °· Remove throw rugs or fasten them to the floor with carpet tape. °· Do not place electrical cords across pathways. °· Put grab bars by your tub, shower, and toilet. Do not use towel bars as grab bars. °· Put handrails on both sides of the stairway. Fix loose handrails. °· Do not climb on stools or stepladders, if possible. °· Do not wax your floors. °· Repair uneven or unsafe sidewalks, walkways, or stairs. °· Keep items you use a lot within reach. °· Be aware of pets. °· Keep emergency numbers next to the telephone. °· Put smoke detectors in your home and near bedrooms. °Ask your doctor what other things you can do to prevent falls. °Document Released: 06/04/2009 Document Revised: 02/07/2012 Document Reviewed: 11/08/2011 °[employer]® Patient Information ©2014 [employer]. ° °

## 2013-11-04 NOTE — ED Provider Notes (Signed)
Care assumed from Dr Regenia Skeeter.  Pt with fall from ladder, no head injury or LOC.  Pain in right medial thigh, right back, lower ribs.  Awaiting ct scan chest, abd/pelvis.  CT scan with adrenal gland mass, recommend MRI.  Pt updated on findings, to f/u with pcm.  Kalman Drape, MD 11/04/13 [phone]

## 2013-11-20 ENCOUNTER — Other Ambulatory Visit: Payer: Self-pay | Admitting: *Deleted

## 2013-11-20 MED ORDER — ESOMEPRAZOLE MAGNESIUM 40 MG PO CPDR: 40.0000 mg | DELAYED_RELEASE_CAPSULE | Freq: Every day | ORAL | Status: DC

## 2013-11-20 NOTE — Telephone Encounter (Signed)
Faxed back to [email or web address] Request

## 2013-11-25 ENCOUNTER — Encounter: Payer: Self-pay | Admitting: Internal Medicine

## 2013-11-25 ENCOUNTER — Other Ambulatory Visit: Payer: Self-pay | Admitting: *Deleted

## 2013-11-25 MED ORDER — ESOMEPRAZOLE MAGNESIUM 40 MG PO CPDR: 40.0000 mg | DELAYED_RELEASE_CAPSULE | Freq: Every day | ORAL | Status: DC

## 2013-11-25 NOTE — Telephone Encounter (Signed)
[facility] Fax [phone]  Vladimir Crofts

## 2013-12-12 NOTE — Telephone Encounter (Signed)
Encounter Closed--TP 12/12/2013 

## 2013-12-16 ENCOUNTER — Encounter: Payer: Self-pay | Admitting: Internal Medicine

## 2013-12-16 DIAGNOSIS — E279 Disorder of adrenal gland, unspecified: Secondary | ICD-10-CM

## 2013-12-16 NOTE — Telephone Encounter (Signed)
The kidneys do have cysts. This is a benign (harmless) finding The left adrenal gland has a small mass that the radiologist suggests a Ct of the adrenal gland to better define whether it is of any importance. I suggest that we go ahead and schedule the CT of the adrenal and a follow up appt.

## 2013-12-16 NOTE — Telephone Encounter (Signed)
Please advise 

## 2013-12-17 NOTE — Telephone Encounter (Signed)
Mr. Halter is correct. The report did say to schedule MRI of the left adrenal. I am sorry to have confused things.

## 2013-12-18 NOTE — Addendum Note (Signed)
Addended by: Estill Dooms on: 12/18/2013 11:04 AM   Modules accepted: Orders

## 2013-12-27 ENCOUNTER — Inpatient Hospital Stay: Admission: RE | Admit: 2013-12-27 | Payer: Medicare HMO | Source: Ambulatory Visit

## 2014-01-03 ENCOUNTER — Ambulatory Visit
Admission: RE | Admit: 2014-01-03 | Discharge: 2014-01-03 | Disposition: A | Discharge status: Home / Self Care | Payer: Medicare HMO | Source: Ambulatory Visit | Attending: Internal Medicine | Admitting: Internal Medicine

## 2014-01-03 DIAGNOSIS — E279 Disorder of adrenal gland, unspecified: Secondary | ICD-10-CM

## 2014-01-03 MED ORDER — GADOBENATE DIMEGLUMINE 529 MG/ML IV SOLN
20.0000 mL | Freq: Once | INTRAVENOUS | Status: AC | PRN
  Administered 2014-01-03: 20 mL via INTRAVENOUS

## 2014-02-24 ENCOUNTER — Other Ambulatory Visit: Payer: Commercial Managed Care - HMO

## 2014-02-24 DIAGNOSIS — E785 Hyperlipidemia, unspecified: Secondary | ICD-10-CM

## 2014-02-24 DIAGNOSIS — I10 Essential (primary) hypertension: Secondary | ICD-10-CM

## 2014-02-25 LAB — COMPREHENSIVE METABOLIC PANEL
ALBUMIN: 4.3 g/dL (ref 3.6–4.8)
ALT: 16 IU/L (ref 0–44)
AST: 12 IU/L (ref 0–40)
Albumin/Globulin Ratio: 2.9 — ABNORMAL HIGH (ref 1.1–2.5)
Alkaline Phosphatase: 69 IU/L (ref 39–117)
BUN/Creatinine Ratio: 14 (ref 10–22)
BUN: 13 mg/dL (ref 8–27)
CO2: 24 mmol/L (ref 18–29)
Calcium: 9.6 mg/dL (ref 8.6–10.2)
Chloride: 101 mmol/L (ref 97–108)
Creatinine, Ser: 0.9 mg/dL (ref 0.76–1.27)
GFR, EST AFRICAN AMERICAN: 103 mL/min/{1.73_m2} (ref >59)
GFR, EST NON AFRICAN AMERICAN: 89 mL/min/{1.73_m2} (ref >59)
Globulin, Total: 1.5 g/dL (ref 1.5–4.5)
Glucose: 90 mg/dL (ref 65–99)
Potassium: 4.3 mmol/L (ref 3.5–5.2)
Sodium: 142 mmol/L (ref 134–144)
Total Bilirubin: 0.5 mg/dL (ref 0.0–1.2)
Total Protein: 5.8 g/dL — ABNORMAL LOW (ref 6.0–8.5)

## 2014-02-25 LAB — LIPID PANEL
CHOL/HDL RATIO: 4.5 ratio (ref 0.0–5.0)
Cholesterol, Total: 180 mg/dL (ref 100–199)
HDL: 40 mg/dL (ref >39)
LDL CALC: 98 mg/dL (ref 0–99)
TRIGLYCERIDES: 212 mg/dL — AB (ref 0–149)
VLDL Cholesterol Cal: 42 mg/dL — ABNORMAL HIGH (ref 5–40)

## 2014-02-26 ENCOUNTER — Other Ambulatory Visit: Payer: Self-pay | Admitting: *Deleted

## 2014-02-26 ENCOUNTER — Encounter: Payer: Self-pay | Admitting: Internal Medicine

## 2014-02-26 ENCOUNTER — Ambulatory Visit (INDEPENDENT_AMBULATORY_CARE_PROVIDER_SITE_OTHER): Payer: Commercial Managed Care - HMO | Admitting: Internal Medicine

## 2014-02-26 VITALS — BP 112/68 | HR 71 | Temp 98.0°F | Wt 254.0 lb

## 2014-02-26 DIAGNOSIS — E785 Hyperlipidemia, unspecified: Secondary | ICD-10-CM

## 2014-02-26 DIAGNOSIS — R739 Hyperglycemia, unspecified: Secondary | ICD-10-CM | POA: Insufficient documentation

## 2014-02-26 DIAGNOSIS — E669 Obesity, unspecified: Secondary | ICD-10-CM

## 2014-02-26 DIAGNOSIS — R7309 Other abnormal glucose: Secondary | ICD-10-CM

## 2014-02-26 DIAGNOSIS — K219 Gastro-esophageal reflux disease without esophagitis: Secondary | ICD-10-CM

## 2014-02-26 DIAGNOSIS — N529 Male erectile dysfunction, unspecified: Secondary | ICD-10-CM

## 2014-02-26 DIAGNOSIS — I10 Essential (primary) hypertension: Secondary | ICD-10-CM

## 2014-02-26 MED ORDER — ESOMEPRAZOLE MAGNESIUM 40 MG PO CPDR: 40.0000 mg | DELAYED_RELEASE_CAPSULE | Freq: Every day | ORAL | Status: DC

## 2014-02-26 NOTE — Progress Notes (Signed)
Patient ID: Anthony [name], male   DOB: 11-19-1950, 63 y.o.   MRN: [id number]    Location:    [facility]  Place of Service:  OFFICE    Allergies  Allergen Reactions  . Codeine Other (See Comments)    Headache  . Lipitor [Atorvastatin] Other (See Comments)    Leg pain/cramps  . Red Yeast Rice [Cholestin] Other (See Comments)    Makes legs hurt  . Statins Other (See Comments)    "muscle break down"  . Latex Rash    Chief Complaint  Patient presents with  . Medical Management of Chronic Issues    4 month follow-up, discuss labs (copy printed)     HPI:  Essential hypertension: controlled  Hyperlipidemia - controlled  Obesity (BMI 30-39.9): continues to work on weight loss. Has not been able to go to the gym for a while due to pains from falling off a ladder in Jan 2015.  Gastroesophageal reflux disease without esophagitis; improved  Impotence of organic origin:for over a year. Did not respond to Cialis.  Hyperglycemia - controlled    Medications: Patient's Medications  New Prescriptions   No medications on file  Previous Medications   AMLODIPINE (NORVASC) 5 MG TABLET    Take 7.5 mg by mouth every morning.    ASPIRIN EC 81 MG TABLET    Take 81 mg by mouth every morning.   BENAZEPRIL (LOTENSIN) 40 MG TABLET    Take 40 mg by mouth every morning.    DORZOLAMIDE-TIMOLOL (COSOPT) 22.3-6.8 MG/ML OPHTHALMIC SOLUTION    Place 1 drop into the right eye 2 (two) times daily.   ESOMEPRAZOLE (NEXIUM) 40 MG CAPSULE    Take 1 capsule (40 mg total) by mouth daily before breakfast. For reflux   EZETIMIBE (ZETIA) 10 MG TABLET    Take 10 mg by mouth every morning.    FINASTERIDE (PROSCAR) 5 MG TABLET    Take 5 mg by mouth every morning.    HYDROCHLOROTHIAZIDE (HYDRODIURIL) 25 MG TABLET    Take 25 mg by mouth every morning.    HYDROCODONE-ACETAMINOPHEN (NORCO) 10-325 MG PER TABLET    Take 2 tablets by mouth every 6 (six) hours as needed for moderate pain.   KETOROLAC (ACULAR) 0.4 % SOLN     Place 1 drop into the right eye 4 (four) times daily.   LATANOPROST (XALATAN) 0.005 % OPHTHALMIC SOLUTION    Place 1 drop into the right eye at bedtime. One drop once daily into the right eye.   METHOCARBAMOL (ROBAXIN) 500 MG TABLET    Take 500 mg by mouth every 6 (six) hours as needed for muscle spasms.   MICONAZOLE NITRATE 2 % SOLN    Apply 1 application topically 2 (two) times daily. Apply to affected nail   NITROGLYCERIN (NITROSTAT) 0.4 MG SL TABLET    Place 1 tablet (0.4 mg total) under the tongue every 5 (five) minutes as needed for chest pain.   OMEGA-3 ACID ETHYL ESTERS (LOVAZA) 1 G CAPSULE    Take 1 g by mouth 2 (two) times daily.   ONDANSETRON (ZOFRAN ODT) 8 MG DISINTEGRATING TABLET    Take 1 tablet (8 mg total) by mouth every 8 (eight) hours as needed for nausea or vomiting.   OXYCODONE-ACETAMINOPHEN (PERCOCET/ROXICET) 5-325 MG PER TABLET    Take 2 tablets by mouth every 4 (four) hours as needed for severe pain.  Modified Medications   No medications on file  Discontinued Medications   No medications on  file     Review of Systems  Constitutional: Negative for activity change and appetite change.       Despite much encouragement, he has not been successful in weight loss.  Eyes: Negative.   Respiratory: Negative for cough and chest tightness.   Cardiovascular: Negative for chest pain, palpitations and leg swelling.  Gastrointestinal: Negative for nausea, diarrhea, constipation, blood in stool and abdominal distention.  Endocrine: Negative.   Genitourinary: Negative.   Musculoskeletal: Positive for arthralgias, back pain and neck pain.       Low back discomfort. Hip pains. Neck pains.  Neurological: Positive for headaches.  Hematological: Negative.   Psychiatric/Behavioral: Negative.     Filed Vitals:   02/26/14 1131  BP: 112/68  Pulse: 71  Temp: 98 F (36.7 C)  TempSrc: Oral  Weight: 254 lb (115.214 kg)  SpO2: 97%   Body mass index is 36.45 kg/(m^2).  Physical  Exam  Constitutional: He is oriented to person, place, and time.  Obese middle-aged male.  HENT:  Head: Normocephalic and atraumatic.  Left Ear: External ear normal.  Nose: Nose normal.  Eyes: Conjunctivae and EOM are normal. Pupils are equal, round, and reactive to light.  Neck: Normal range of motion. No JVD present. No tracheal deviation present. No thyromegaly present.  Moderate stiffness of the neck.  Cardiovascular: Normal rate, regular rhythm, normal heart sounds and intact distal pulses.  Exam reveals no gallop and no friction rub.   No murmur heard. Prescription lenses  Pulmonary/Chest: No respiratory distress. He has no wheezes. He has no rales. He exhibits no tenderness.  Abdominal: Soft. Bowel sounds are normal. He exhibits no distension and no mass. There is no tenderness.  Musculoskeletal: Normal range of motion. He exhibits no edema and no tenderness.  Pain in lower back and hips with movement. Pain in the back with flexion, extension, and rotational movements of the head.  Lymphadenopathy:    He has no cervical adenopathy.  Neurological: He is alert and oriented to person, place, and time. No cranial nerve deficit. Coordination normal.  Skin: Skin is warm and dry. No rash noted. No erythema. No pallor.  Psychiatric: He has a normal mood and affect. His behavior is normal. Judgment and thought content normal.     Labs reviewed: Appointment on 02/24/2014  Component Date Value Ref Range Status  . Glucose 02/24/2014 90  65 - 99 mg/dL Final  . BUN 02/24/2014 13  8 - 27 mg/dL Final  . Creatinine, Ser 02/24/2014 0.90  0.76 - 1.27 mg/dL Final  . GFR calc non Af Amer 02/24/2014 89  >59 mL/min/1.73 Final  . GFR calc Af Amer 02/24/2014 103  >59 mL/min/1.73 Final  . BUN/Creatinine Ratio 02/24/2014 14  10 - 22 Final  . Sodium 02/24/2014 142  134 - 144 mmol/L Final  . Potassium 02/24/2014 4.3  3.5 - 5.2 mmol/L Final  . Chloride 02/24/2014 101  97 - 108 mmol/L Final  . CO2  02/24/2014 24  18 - 29 mmol/L Final  . Calcium 02/24/2014 9.6  8.6 - 10.2 mg/dL Final  . Total Protein 02/24/2014 5.8* 6.0 - 8.5 g/dL Final  . Albumin 02/24/2014 4.3  3.6 - 4.8 g/dL Final  . Globulin, Total 02/24/2014 1.5  1.5 - 4.5 g/dL Final  . Albumin/Globulin Ratio 02/24/2014 2.9* 1.1 - 2.5 Final  . Total Bilirubin 02/24/2014 0.5  0.0 - 1.2 mg/dL Final  . Alkaline Phosphatase 02/24/2014 69  39 - 117 IU/L Final  . AST 02/24/2014  12  0 - 40 IU/L Final  . ALT 02/24/2014 16  0 - 44 IU/L Final  . Cholesterol, Total 02/24/2014 180  100 - 199 mg/dL Final  . Triglycerides 02/24/2014 212* 0 - 149 mg/dL Final  . HDL 02/24/2014 40  >39 mg/dL Final   Comment: According to ATP-III Guidelines, HDL-C >59 mg/dL is considered a                          negative risk factor for CHD.  Marland Kitchen VLDL Cholesterol Cal 02/24/2014 42* 5 - 40 mg/dL Final  . LDL Calculated 02/24/2014 98  0 - 99 mg/dL Final  . Chol/HDL Ratio 02/24/2014 4.5  0.0 - 5.0 ratio units Final   Comment:                                   T. Chol/HDL Ratio                                                                      Men  Women                                                        1/2 Avg.Risk  3.4    3.3                                                            Avg.Risk  5.0    4.4                                                         2X Avg.Risk  9.6    7.1                                                         3X Avg.Risk 23.4   11.0      Assessment/Plan  1. Essential hypertension cntrolled  2. Hyperlipidemia controlled - Lipid panel; Future  3. Obesity (BMI 30-39.9) Encouraged weight loss. Resume exercise at gym  4. Gastroesophageal reflux disease without esophagitis Improved. Does not tolerate fatty or fried foods.  5. Impotence of organic origin Told him I think the hyperglycemia and prediabetic state is the most likely cause. Offered urology referral, but he does not want this at this time.  6.  Hyperglycemia lkle prediabetic state. Needs to continue weight loss. - Hemoglobin A1c; Future - Basic metabolic panel; Future - Microalbumin / creatinine urine  ratio; Future

## 2014-02-26 NOTE — Telephone Encounter (Signed)
[facility] # [phone]

## 2014-06-06 ENCOUNTER — Other Ambulatory Visit: Payer: Self-pay

## 2014-06-25 ENCOUNTER — Other Ambulatory Visit: Payer: Commercial Managed Care - HMO

## 2014-06-30 ENCOUNTER — Other Ambulatory Visit: Payer: Commercial Managed Care - HMO

## 2014-07-01 ENCOUNTER — Ambulatory Visit: Payer: Commercial Managed Care - HMO | Admitting: Internal Medicine

## 2014-07-28 ENCOUNTER — Other Ambulatory Visit: Payer: Commercial Managed Care - HMO

## 2014-07-28 DIAGNOSIS — E785 Hyperlipidemia, unspecified: Secondary | ICD-10-CM

## 2014-07-28 DIAGNOSIS — R739 Hyperglycemia, unspecified: Secondary | ICD-10-CM

## 2014-07-29 LAB — LIPID PANEL
CHOL/HDL RATIO: 4.9 ratio (ref 0.0–5.0)
Cholesterol, Total: 190 mg/dL (ref 100–199)
HDL: 39 mg/dL — AB (ref >39)
LDL CALC: 115 mg/dL — AB (ref 0–99)
Triglycerides: 178 mg/dL — ABNORMAL HIGH (ref 0–149)
VLDL Cholesterol Cal: 36 mg/dL (ref 5–40)

## 2014-07-29 LAB — BASIC METABOLIC PANEL
BUN / CREAT RATIO: 20 (ref 10–22)
BUN: 17 mg/dL (ref 8–27)
CALCIUM: 9.2 mg/dL (ref 8.6–10.2)
CO2: 22 mmol/L (ref 18–29)
Chloride: 105 mmol/L (ref 97–108)
Creatinine, Ser: 0.86 mg/dL (ref 0.76–1.27)
GFR, EST AFRICAN AMERICAN: 105 mL/min/{1.73_m2} (ref >59)
GFR, EST NON AFRICAN AMERICAN: 91 mL/min/{1.73_m2} (ref >59)
Glucose: 100 mg/dL — ABNORMAL HIGH (ref 65–99)
POTASSIUM: 4 mmol/L (ref 3.5–5.2)
SODIUM: 143 mmol/L (ref 134–144)

## 2014-07-29 LAB — MICROALBUMIN / CREATININE URINE RATIO
CREATININE UR: 172.9 mg/dL (ref 22.0–328.0)
MICROALB/CREAT RATIO: 4.2 mg/g creat (ref 0.0–30.0)
MICROALBUM., U, RANDOM: 7.2 ug/mL (ref 0.0–17.0)

## 2014-07-29 LAB — HEMOGLOBIN A1C
ESTIMATED AVERAGE GLUCOSE: 117 mg/dL
Hgb A1c MFr Bld: 5.7 % — ABNORMAL HIGH (ref 4.8–5.6)

## 2014-07-30 ENCOUNTER — Ambulatory Visit: Payer: Commercial Managed Care - HMO | Admitting: Internal Medicine

## 2014-07-31 ENCOUNTER — Encounter (HOSPITAL_COMMUNITY): Payer: Self-pay | Admitting: Cardiovascular Disease

## 2014-08-12 ENCOUNTER — Ambulatory Visit: Payer: Commercial Managed Care - HMO | Admitting: Internal Medicine

## 2014-09-23 ENCOUNTER — Ambulatory Visit (INDEPENDENT_AMBULATORY_CARE_PROVIDER_SITE_OTHER): Payer: PPO | Admitting: Internal Medicine

## 2014-09-23 ENCOUNTER — Encounter: Payer: Self-pay | Admitting: Internal Medicine

## 2014-09-23 VITALS — BP 132/80 | HR 70 | Temp 97.9°F | Resp 12 | Ht 70.0 in | Wt 238.0 lb

## 2014-09-23 DIAGNOSIS — R972 Elevated prostate specific antigen [PSA]: Secondary | ICD-10-CM | POA: Diagnosis not present

## 2014-09-23 DIAGNOSIS — R1314 Dysphagia, pharyngoesophageal phase: Secondary | ICD-10-CM

## 2014-09-23 DIAGNOSIS — I10 Essential (primary) hypertension: Secondary | ICD-10-CM | POA: Diagnosis not present

## 2014-09-23 DIAGNOSIS — K222 Esophageal obstruction: Secondary | ICD-10-CM

## 2014-09-23 DIAGNOSIS — Z23 Encounter for immunization: Secondary | ICD-10-CM | POA: Diagnosis not present

## 2014-09-23 DIAGNOSIS — B351 Tinea unguium: Secondary | ICD-10-CM | POA: Diagnosis not present

## 2014-09-23 DIAGNOSIS — K219 Gastro-esophageal reflux disease without esophagitis: Secondary | ICD-10-CM | POA: Diagnosis not present

## 2014-09-23 DIAGNOSIS — N400 Enlarged prostate without lower urinary tract symptoms: Secondary | ICD-10-CM

## 2014-09-23 DIAGNOSIS — L989 Disorder of the skin and subcutaneous tissue, unspecified: Secondary | ICD-10-CM

## 2014-09-23 DIAGNOSIS — D2371 Other benign neoplasm of skin of right lower limb, including hip: Secondary | ICD-10-CM | POA: Diagnosis not present

## 2014-09-23 DIAGNOSIS — L0889 Other specified local infections of the skin and subcutaneous tissue: Secondary | ICD-10-CM

## 2014-09-23 DIAGNOSIS — R739 Hyperglycemia, unspecified: Secondary | ICD-10-CM

## 2014-09-23 DIAGNOSIS — D2370 Other benign neoplasm of skin of unspecified lower limb, including hip: Secondary | ICD-10-CM | POA: Insufficient documentation

## 2014-09-23 DIAGNOSIS — L089 Local infection of the skin and subcutaneous tissue, unspecified: Secondary | ICD-10-CM

## 2014-09-23 DIAGNOSIS — E785 Hyperlipidemia, unspecified: Secondary | ICD-10-CM

## 2014-09-23 MED ORDER — HYDROCHLOROTHIAZIDE 25 MG PO TABS: 25.0000 mg | ORAL_TABLET | Freq: Every morning | ORAL | Status: DC

## 2014-09-23 MED ORDER — BENAZEPRIL HCL 40 MG PO TABS
40.0000 mg | ORAL_TABLET | Freq: Every morning | ORAL | Status: DC
Start: 2014-09-23 — End: 2015-09-23

## 2014-09-23 MED ORDER — FINASTERIDE 5 MG PO TABS: 5.0000 mg | ORAL_TABLET | Freq: Every morning | ORAL | Status: DC

## 2014-09-23 MED ORDER — EZETIMIBE 10 MG PO TABS: 10.0000 mg | ORAL_TABLET | Freq: Every morning | ORAL | Status: DC

## 2014-09-23 MED ORDER — AMLODIPINE BESYLATE 5 MG PO TABS: 7.5000 mg | ORAL_TABLET | Freq: Every morning | ORAL | Status: DC

## 2014-09-23 MED ORDER — ZOSTER VACCINE LIVE 19400 UNT/0.65ML ~~LOC~~ SOLR: 0.6500 mL | Freq: Once | SUBCUTANEOUS | Status: DC

## 2014-09-23 NOTE — Progress Notes (Signed)
Patient ID: Anthony [name], male   DOB: 1951/02/06, 64 y.o.   MRN: [id number]    Facility  [facility]    Place of Service:   OFFICE   Allergies  Allergen Reactions  . Codeine Other (See Comments)    Headache  . Lipitor [Atorvastatin] Other (See Comments)    Leg pain/cramps  . Red Yeast Rice [Cholestin] Other (See Comments)    Makes legs hurt  . Statins Other (See Comments)    "muscle break down"  . Latex Rash    Chief Complaint  Patient presents with  . Medical Management of Chronic Issues    7 month follow-up, renew medications  . Immunizations    Discuss need for prevnar 13, update flu vaccine (if ok dut to URI)   . URI    Hacking cough and congestion   . Foot Problem    Examine left big toe nail  . Hand Problem    Examine pointer finger on left hand- reocurring cyst   . Leg Problem    Examine raised area on right upper thigh (purple and sore)    HPI:  Hyperlipidemia: Controlled  Gastroesophageal reflux disease without esophagitis: Mild dysphagia with food sticking in chest periodically  Essential hypertension: Controlled  Dysphagia, pharyngoesophageal phase: Mild dysphagia with food sticking in chest periodically  Esophageal stenosis: previously needed dilation  Elevated prostate specific antigen (PSA): normal on 3/121/15  Onychomycosis: left great toenail.  Hyperglycemia: controlled  Finger lesion: ganglion on the left 2nd finger  Dermatofibroma of thigh, right: noted a small lump several weeks ago.  Skin pustule: left side of the chin    Medications: Patient's Medications  New Prescriptions   No medications on file  Previous Medications   AMLODIPINE (NORVASC) 5 MG TABLET    Take 7.5 mg by mouth every morning.    ASPIRIN EC 81 MG TABLET    Take 81 mg by mouth every morning.   BENAZEPRIL (LOTENSIN) 40 MG TABLET    Take 40 mg by mouth every morning.    DORZOLAMIDE-TIMOLOL (COSOPT) 22.3-6.8 MG/ML OPHTHALMIC SOLUTION    Place 1 drop into the right eye 2  (two) times daily.   ESOMEPRAZOLE (NEXIUM) 40 MG CAPSULE    Take 1 capsule (40 mg total) by mouth daily before breakfast. For reflux   EZETIMIBE (ZETIA) 10 MG TABLET    Take 10 mg by mouth every morning.    FINASTERIDE (PROSCAR) 5 MG TABLET    Take 5 mg by mouth every morning.    HYDROCHLOROTHIAZIDE (HYDRODIURIL) 25 MG TABLET    Take 25 mg by mouth every morning.    HYDROCODONE-ACETAMINOPHEN (NORCO) 10-325 MG PER TABLET    Take 2 tablets by mouth every 6 (six) hours as needed for moderate pain.   KETOROLAC (ACULAR) 0.4 % SOLN    Place 1 drop into the right eye 4 (four) times daily.   LATANOPROST (XALATAN) 0.005 % OPHTHALMIC SOLUTION    Place 1 drop into the right eye at bedtime. One drop once daily into the right eye.   METHOCARBAMOL (ROBAXIN) 500 MG TABLET    Take 500 mg by mouth every 6 (six) hours as needed for muscle spasms.   MICONAZOLE NITRATE 2 % SOLN    Apply 1 application topically 2 (two) times daily. Apply to affected nail   NITROGLYCERIN (NITROSTAT) 0.4 MG SL TABLET    Place 1 tablet (0.4 mg total) under the tongue every 5 (five) minutes as needed for chest pain.  OMEGA-3 ACID ETHYL ESTERS (LOVAZA) 1 G CAPSULE    Take 1 g by mouth 2 (two) times daily.   OXYCODONE-ACETAMINOPHEN (PERCOCET/ROXICET) 5-325 MG PER TABLET    Take 2 tablets by mouth every 4 (four) hours as needed for severe pain.   ZOSTER VACCINE LIVE, PF, (ZOSTAVAX) 38101 UNT/0.65ML INJECTION    Inject 0.65 mLs into the skin once.  Modified Medications   No medications on file  Discontinued Medications   ONDANSETRON (ZOFRAN ODT) 8 MG DISINTEGRATING TABLET    Take 1 tablet (8 mg total) by mouth every 8 (eight) hours as needed for nausea or vomiting.     Review of Systems  Constitutional: Negative for activity change and appetite change.       Despite much encouragement, he has not been successful in weight loss.  Eyes: Negative.   Respiratory: Negative for cough and chest tightness.   Cardiovascular: Negative for chest  pain, palpitations and leg swelling.  Gastrointestinal: Negative for nausea, diarrhea, constipation, blood in stool and abdominal distention.       Mild dysphagia with food sticking in the chest periodically.  Endocrine: Negative.   Genitourinary: Negative.   Musculoskeletal: Positive for back pain, arthralgias and neck pain.       Low back discomfort. Hip pains. Neck pains.  Neurological: Positive for headaches.  Hematological: Negative.   Psychiatric/Behavioral: Negative.     Filed Vitals:   09/23/14 1519  BP: 132/80  Pulse: 70  Temp: 97.9 F (36.6 C)  TempSrc: Oral  Resp: 12  Height: 5\' 10"  (1.778 m)  Weight: 238 lb (107.956 kg)  SpO2: 98%   Body mass index is 34.15 kg/(m^2).  Physical Exam  Constitutional: He is oriented to person, place, and time.  Obese middle-aged male.  HENT:  Head: Normocephalic and atraumatic.  Left Ear: External ear normal.  Nose: Nose normal.  Eyes: Conjunctivae and EOM are normal. Pupils are equal, round, and reactive to light.  Neck: Normal range of motion. No JVD present. No tracheal deviation present. No thyromegaly present.  Moderate stiffness of the neck.  Cardiovascular: Normal rate, regular rhythm, normal heart sounds and intact distal pulses.  Exam reveals no gallop and no friction rub.   No murmur heard. Prescription lenses  Pulmonary/Chest: No respiratory distress. He has no wheezes. He has no rales. He exhibits no tenderness.  Abdominal: Soft. Bowel sounds are normal. He exhibits no distension and no mass. There is no tenderness.  Musculoskeletal: Normal range of motion. He exhibits no edema or tenderness.  Pain in lower back and hips with movement. Pain in the back with flexion, extension, and rotational movements of the head.  Lymphadenopathy:    He has no cervical adenopathy.  Neurological: He is alert and oriented to person, place, and time. No cranial nerve deficit. Coordination normal.  Skin: Skin is warm and dry. No rash  noted. No erythema. No pallor.  Ganglion of the left index finger. Onychomycosis of the left great toenail. Dermatofibroma medial right thigh. Slightly tender to touch.  Psychiatric: He has a normal mood and affect. His behavior is normal. Judgment and thought content normal.     Labs reviewed: Appointment on 07/28/2014  Component Date Value Ref Range Status  . Hgb A1c MFr Bld 07/28/2014 5.7* 4.8 - 5.6 % Final   Comment:          Pre-diabetes: 5.7 - 6.4          Diabetes: >6.4  Glycemic control for adults with diabetes: <7.0   . Est. average glucose Bld gHb Est-m* 07/28/2014 117   Final  . Glucose 07/28/2014 100* 65 - 99 mg/dL Final  . BUN 07/28/2014 17  8 - 27 mg/dL Final  . Creatinine, Ser 07/28/2014 0.86  0.76 - 1.27 mg/dL Final  . GFR calc non Af Amer 07/28/2014 91  >59 mL/min/1.73 Final  . GFR calc Af Amer 07/28/2014 105  >59 mL/min/1.73 Final  . BUN/Creatinine Ratio 07/28/2014 20  10 - 22 Final  . Sodium 07/28/2014 143  134 - 144 mmol/L Final  . Potassium 07/28/2014 4.0  3.5 - 5.2 mmol/L Final  . Chloride 07/28/2014 105  97 - 108 mmol/L Final  . CO2 07/28/2014 22  18 - 29 mmol/L Final  . Calcium 07/28/2014 9.2  8.6 - 10.2 mg/dL Final  . Cholesterol, Total 07/28/2014 190  100 - 199 mg/dL Final  . Triglycerides 07/28/2014 178* 0 - 149 mg/dL Final  . HDL 07/28/2014 39* >39 mg/dL Final   Comment: According to ATP-III Guidelines, HDL-C >59 mg/dL is considered a negative risk factor for CHD.   Marland Kitchen VLDL Cholesterol Cal 07/28/2014 36  5 - 40 mg/dL Final  . LDL Calculated 07/28/2014 115* 0 - 99 mg/dL Final  . Chol/HDL Ratio 07/28/2014 4.9  0.0 - 5.0 ratio units Final   Comment:                                   T. Chol/HDL Ratio                                             Men  Women                               1/2 Avg.Risk  3.4    3.3                                   Avg.Risk  5.0    4.4                                2X Avg.Risk  9.6    7.1                                 3X Avg.Risk 23.4   11.0   . Creatinine, Ur 07/28/2014 172.9  22.0 - 328.0 mg/dL Final  . Microalbum.,U,Random 07/28/2014 7.2  0.0 - 17.0 ug/mL Final  . MICROALB/CREAT RATIO 07/28/2014 4.2  0.0 - 30.0 mg/g creat Final     Assessment/Plan  1. Hyperlipidemia Controlled - ezetimibe (ZETIA) 10 MG tablet; Take 1 tablet (10 mg total) by mouth every morning.  Dispense: 90 tablet; Refill: 3 - Lipid panel; Future  2. Gastroesophageal reflux disease without esophagitis Mild dysphagia with food sticking in the mid chest - Ambulatory referral to Gastroenterology  3. Essential hypertension Controlled - hydrochlorothiazide (HYDRODIURIL) 25 MG tablet; Take 1 tablet (25 mg total) by mouth every morning.  Dispense: 90 tablet; Refill: 4 - benazepril (LOTENSIN) 40 MG tablet;  Take 1 tablet (40 mg total) by mouth every morning.  Dispense: 90 tablet; Refill: 3 - amLODipine (NORVASC) 5 MG tablet; Take 1.5 tablets (7.5 mg total) by mouth every morning.  Dispense: 135 tablet; Refill: 3 - Basic metabolic panel; Future  4. Dysphagia, pharyngoesophageal phase C gastroenterologist  5. Esophageal stenosis - Ambulatory referral to Gastroenterology  6. Elevated prostate specific antigen (PSA) Normal  7. Onychomycosis Use 2 percent miconazole solution prescribed in the past  8. Hyperglycemia Controlled - Hemoglobin A1c; Future  9. Finger lesion - Ambulatory referral to Dermatology  10. Dermatofibroma of thigh, right - Ambulatory referral to Dermatology  11. Skin pustule Daily soap and water cleanse  12. BPH (benign prostatic hyperplasia) - finasteride (PROSCAR) 5 MG tablet; Take 1 tablet (5 mg total) by mouth every morning.  Dispense: 90 tablet; Refill: 3  13. Vaccine for VZV (varicella-zoster virus) zoster vaccine live, PF, (ZOSTAVAX) 50277 UNT/0.65ML injection; Inject 19,400 Units into the skin once.  Dispense: 1 each; Refill: 0

## 2014-09-26 ENCOUNTER — Encounter: Payer: Self-pay | Admitting: Internal Medicine

## 2014-10-07 ENCOUNTER — Other Ambulatory Visit: Payer: Self-pay | Admitting: Gastroenterology

## 2014-10-07 DIAGNOSIS — R1310 Dysphagia, unspecified: Secondary | ICD-10-CM

## 2014-10-09 ENCOUNTER — Ambulatory Visit
Admission: RE | Admit: 2014-10-09 | Discharge: 2014-10-09 | Disposition: A | Discharge status: Home / Self Care | Payer: PPO | Source: Ambulatory Visit | Attending: Gastroenterology | Admitting: Gastroenterology

## 2014-10-09 ENCOUNTER — Encounter: Payer: Self-pay | Admitting: Internal Medicine

## 2014-10-09 DIAGNOSIS — R1310 Dysphagia, unspecified: Secondary | ICD-10-CM

## 2014-11-10 ENCOUNTER — Telehealth: Payer: Self-pay | Admitting: *Deleted

## 2014-11-10 NOTE — Telephone Encounter (Signed)
LMOM regarding flu vaccine, checking to see if it was given on 09/23/2014 when he came in for an appointment. He had a questionable upper respiratory  Infection.

## 2015-02-05 ENCOUNTER — Other Ambulatory Visit: Payer: Self-pay | Admitting: *Deleted

## 2015-02-05 DIAGNOSIS — E559 Vitamin D deficiency, unspecified: Secondary | ICD-10-CM

## 2015-02-06 ENCOUNTER — Other Ambulatory Visit: Payer: PPO

## 2015-02-06 DIAGNOSIS — E785 Hyperlipidemia, unspecified: Secondary | ICD-10-CM

## 2015-02-06 DIAGNOSIS — I10 Essential (primary) hypertension: Secondary | ICD-10-CM

## 2015-02-06 DIAGNOSIS — E559 Vitamin D deficiency, unspecified: Secondary | ICD-10-CM

## 2015-02-06 DIAGNOSIS — R739 Hyperglycemia, unspecified: Secondary | ICD-10-CM

## 2015-02-07 LAB — BASIC METABOLIC PANEL
BUN/Creatinine Ratio: 20 (ref 10–22)
BUN: 19 mg/dL (ref 8–27)
CALCIUM: 9.1 mg/dL (ref 8.6–10.2)
CHLORIDE: 104 mmol/L (ref 97–108)
CO2: 26 mmol/L (ref 18–29)
CREATININE: 0.93 mg/dL (ref 0.76–1.27)
GFR calc Af Amer: 99 mL/min/{1.73_m2} (ref >59)
GFR, EST NON AFRICAN AMERICAN: 85 mL/min/{1.73_m2} (ref >59)
GLUCOSE: 96 mg/dL (ref 65–99)
POTASSIUM: 3.9 mmol/L (ref 3.5–5.2)
Sodium: 143 mmol/L (ref 134–144)

## 2015-02-07 LAB — LIPID PANEL
CHOLESTEROL TOTAL: 162 mg/dL (ref 100–199)
Chol/HDL Ratio: 4.1 ratio units (ref 0.0–5.0)
HDL: 40 mg/dL (ref >39)
LDL Calculated: 85 mg/dL (ref 0–99)
TRIGLYCERIDES: 185 mg/dL — AB (ref 0–149)
VLDL CHOLESTEROL CAL: 37 mg/dL (ref 5–40)

## 2015-02-07 LAB — HEMOGLOBIN A1C
ESTIMATED AVERAGE GLUCOSE: 123 mg/dL
Hgb A1c MFr Bld: 5.9 % — ABNORMAL HIGH (ref 4.8–5.6)

## 2015-02-07 LAB — VITAMIN D 25 HYDROXY (VIT D DEFICIENCY, FRACTURES): Vit D, 25-Hydroxy: 29.8 ng/mL — ABNORMAL LOW (ref 30.0–100.0)

## 2015-02-11 ENCOUNTER — Ambulatory Visit (INDEPENDENT_AMBULATORY_CARE_PROVIDER_SITE_OTHER): Payer: PPO | Admitting: Internal Medicine

## 2015-02-11 ENCOUNTER — Encounter: Payer: Self-pay | Admitting: Internal Medicine

## 2015-02-11 VITALS — BP 138/78 | HR 69 | Temp 98.1°F | Resp 20 | Ht 70.0 in | Wt 267.0 lb

## 2015-02-11 DIAGNOSIS — E559 Vitamin D deficiency, unspecified: Secondary | ICD-10-CM | POA: Insufficient documentation

## 2015-02-11 DIAGNOSIS — R739 Hyperglycemia, unspecified: Secondary | ICD-10-CM

## 2015-02-11 DIAGNOSIS — I10 Essential (primary) hypertension: Secondary | ICD-10-CM | POA: Diagnosis not present

## 2015-02-11 DIAGNOSIS — R1314 Dysphagia, pharyngoesophageal phase: Secondary | ICD-10-CM | POA: Diagnosis not present

## 2015-02-11 DIAGNOSIS — K219 Gastro-esophageal reflux disease without esophagitis: Secondary | ICD-10-CM

## 2015-02-11 DIAGNOSIS — E669 Obesity, unspecified: Secondary | ICD-10-CM

## 2015-02-11 DIAGNOSIS — K2960 Other gastritis without bleeding: Secondary | ICD-10-CM

## 2015-02-11 NOTE — Progress Notes (Signed)
Patient ID: Anthony [name], male   DOB: [birth year], 64 y.o.   MRN: [id number]    Facility  [facility]    Place of Service:   OFFICE    Allergies  Allergen Reactions  . Codeine Other (See Comments)    Headache  . Lipitor [Atorvastatin] Other (See Comments)    Leg pain/cramps  . Red Yeast Rice [Cholestin] Other (See Comments)    Makes legs hurt  . Statins Other (See Comments)    "muscle break down"  . Latex Rash    Chief Complaint  Patient presents with  . Medical Management of Chronic Issues    HPI:  Obesity (BMI 30-39.9): Patient is aware of since grossly overweight state. He is also aware that it may be contributing to reflux problems. He feels that he can manage his diet better.  Gastroesophageal reflux disease without esophagitis: Has been treated with acid suppressing agents, but they don't seem to be doing much good now. He continues to have problems with indigestion and heartburn following meals.  Gastritis, bile acid reflux: Patient is status post cholecystectomy. With failure of acid suppressing medications to give him relief, I'm beginning to suspect that there may be a bile reflux problem.  Dysphagia, pharyngoesophageal phase: Denies food sticking in his chest or painful swallowing.  Hyperglycemia: Controlled  Essential hypertension: Controlled  Vitamin D deficiency: Low borderline values. Could be associated with chronic use of Nexium.    Medications: Patient's Medications  New Prescriptions   No medications on file  Previous Medications   AMLODIPINE (NORVASC) 5 MG TABLET    Take 1.5 tablets (7.5 mg total) by mouth every morning.   ASPIRIN EC 81 MG TABLET    Take 81 mg by mouth every morning.   BENAZEPRIL (LOTENSIN) 40 MG TABLET    Take 1 tablet (40 mg total) by mouth every morning.   DORZOLAMIDE-TIMOLOL (COSOPT) 22.3-6.8 MG/ML OPHTHALMIC SOLUTION    Place 1 drop into the right eye 2 (two) times daily.   ESOMEPRAZOLE (NEXIUM) 40 MG CAPSULE    Take 1 capsule  (40 mg total) by mouth daily before breakfast. For reflux   EZETIMIBE (ZETIA) 10 MG TABLET    Take 1 tablet (10 mg total) by mouth every morning.   FINASTERIDE (PROSCAR) 5 MG TABLET    Take 1 tablet (5 mg total) by mouth every morning.   HYDROCHLOROTHIAZIDE (HYDRODIURIL) 25 MG TABLET    Take 1 tablet (25 mg total) by mouth every morning.   HYDROCODONE-ACETAMINOPHEN (NORCO) 10-325 MG PER TABLET    Take 2 tablets by mouth every 6 (six) hours as needed for moderate pain.   KETOROLAC (ACULAR) 0.4 % SOLN    Place 1 drop into the right eye 4 (four) times daily.   LATANOPROST (XALATAN) 0.005 % OPHTHALMIC SOLUTION    Place 1 drop into the right eye at bedtime. One drop once daily into the right eye.   METHOCARBAMOL (ROBAXIN) 500 MG TABLET    Take 500 mg by mouth every 6 (six) hours as needed for muscle spasms.   MICONAZOLE NITRATE 2 % SOLN    Apply 1 application topically 2 (two) times daily. Apply to affected nail   NITROGLYCERIN (NITROSTAT) 0.4 MG SL TABLET    Place 1 tablet (0.4 mg total) under the tongue every 5 (five) minutes as needed for chest pain.   OMEGA-3 ACID ETHYL ESTERS (LOVAZA) 1 G CAPSULE    Take 1 g by mouth 2 (two) times daily.  OXYCODONE-ACETAMINOPHEN (PERCOCET/ROXICET) 5-325 MG PER TABLET    Take 2 tablets by mouth every 4 (four) hours as needed for severe pain.   VITAMIN D, ERGOCALCIFEROL, (DRISDOL) 50000 UNITS CAPS CAPSULE    Take 50,000 Units by mouth every 7 (seven) days.   ZOSTER VACCINE LIVE, PF, (ZOSTAVAX) 56389 UNT/0.65ML INJECTION    Inject 19,400 Units into the skin once.  Modified Medications   No medications on file  Discontinued Medications   No medications on file     Review of Systems  Constitutional: Negative for activity change and appetite change.       Despite much encouragement, he has not been successful in weight loss.  Eyes: Negative.   Respiratory: Negative for cough and chest tightness.   Cardiovascular: Negative for chest pain, palpitations and leg  swelling.  Gastrointestinal: Negative for nausea, diarrhea, constipation, blood in stool and abdominal distention.       Mild dysphagia with food sticking in the chest periodically.  Endocrine: Negative.   Genitourinary: Negative.   Musculoskeletal: Positive for back pain, arthralgias and neck pain.       Low back discomfort. Hip pains. Neck pains.  Neurological: Positive for headaches.  Hematological: Negative.   Psychiatric/Behavioral: Negative.     Filed Vitals:   02/11/15 1602  BP: 138/78  Pulse: 69  Temp: 98.1 F (36.7 C)  TempSrc: Oral  Resp: 20  Height: 5\' 10"  (1.778 m)  Weight: 267 lb (121.11 kg)  SpO2: 96%   Body mass index is 38.31 kg/(m^2).  Physical Exam  Constitutional: He is oriented to person, place, and time.  Obese middle-aged male.  HENT:  Head: Normocephalic and atraumatic.  Left Ear: External ear normal.  Nose: Nose normal.  Eyes: Conjunctivae and EOM are normal. Pupils are equal, round, and reactive to light.  Neck: Normal range of motion. No JVD present. No tracheal deviation present. No thyromegaly present.  Moderate stiffness of the neck.  Cardiovascular: Normal rate, regular rhythm, normal heart sounds and intact distal pulses.  Exam reveals no gallop and no friction rub.   No murmur heard. Prescription lenses  Pulmonary/Chest: No respiratory distress. He has no wheezes. He has no rales. He exhibits no tenderness.  Abdominal: Soft. Bowel sounds are normal. He exhibits no distension and no mass. There is no tenderness.  Musculoskeletal: Normal range of motion. He exhibits no edema or tenderness.  Pain in lower back and hips with movement. Pain in the back with flexion, extension, and rotational movements of the head.  Lymphadenopathy:    He has no cervical adenopathy.  Neurological: He is alert and oriented to person, place, and time. No cranial nerve deficit. Coordination normal.  Skin: Skin is warm and dry. No rash noted. No erythema. No pallor.   Ganglion of the left index finger. Onychomycosis of the left great toenail. Dermatofibroma medial right thigh. Slightly tender to touch.  Psychiatric: He has a normal mood and affect. His behavior is normal. Judgment and thought content normal.     Labs reviewed: Appointment on 02/06/2015  Component Date Value Ref Range Status  . Glucose 02/06/2015 96  65 - 99 mg/dL Final  . BUN 02/06/2015 19  8 - 27 mg/dL Final  . Creatinine, Ser 02/06/2015 0.93  0.76 - 1.27 mg/dL Final  . GFR calc non Af Amer 02/06/2015 85  >59 mL/min/1.73 Final  . GFR calc Af Amer 02/06/2015 99  >59 mL/min/1.73 Final  . BUN/Creatinine Ratio 02/06/2015 20  10 - 22 Final  .  Sodium 02/06/2015 143  134 - 144 mmol/L Final  . Potassium 02/06/2015 3.9  3.5 - 5.2 mmol/L Final  . Chloride 02/06/2015 104  97 - 108 mmol/L Final  . CO2 02/06/2015 26  18 - 29 mmol/L Final  . Calcium 02/06/2015 9.1  8.6 - 10.2 mg/dL Final  . Hgb A1c MFr Bld 02/06/2015 5.9* 4.8 - 5.6 % Final   Comment:          Pre-diabetes: 5.7 - 6.4          Diabetes: >6.4          Glycemic control for adults with diabetes: <7.0   . Est. average glucose Bld gHb Est-m* 02/06/2015 123   Final  . Cholesterol, Total 02/06/2015 162  100 - 199 mg/dL Final  . Triglycerides 02/06/2015 185* 0 - 149 mg/dL Final  . HDL 02/06/2015 40  >39 mg/dL Final   Comment: According to ATP-III Guidelines, HDL-C >59 mg/dL is considered a negative risk factor for CHD.   Marland Kitchen VLDL Cholesterol Cal 02/06/2015 37  5 - 40 mg/dL Final  . LDL Calculated 02/06/2015 85  0 - 99 mg/dL Final  . Chol/HDL Ratio 02/06/2015 4.1  0.0 - 5.0 ratio units Final   Comment:                                   T. Chol/HDL Ratio                                             Men  Women                               1/2 Avg.Risk  3.4    3.3                                   Avg.Risk  5.0    4.4                                2X Avg.Risk  9.6    7.1                                3X Avg.Risk 23.4   11.0   .  Vit D, 25-Hydroxy 02/06/2015 29.8* 30.0 - 100.0 ng/mL Final   Comment: Vitamin D deficiency has been defined by the [place] practice guideline as a level of serum 25-OH vitamin D less than 20 ng/mL (1,2). The Endocrine Society went on to further define vitamin D insufficiency as a level between 21 and 29 ng/mL (2). 1. IOM (Institute of Medicine). 2010. Dietary reference    intakes for calcium and D. [place]: The    [employer]. 2. Holick MF, Binkley [place], Bischoff-Ferrari HA, et al.    Evaluation, treatment, and prevention of vitamin D    deficiency: an Endocrine Society clinical practice    guideline. JCEM. 2011 Jul; 96(7):1911-30.      Assessment/Plan  1. Obesity (BMI 30-39.9) Weight loss was emphasized as a potential solution  for reflux problems as well as in general being better for his health in regards to control of glucose and blood pressure. 1000-calorie diet was given to the patient.  2. Gastroesophageal reflux disease without esophagitis Discontinue Nexium. Recommended that he try Maalox or Mylanta 1-2 tablespoons after meals and with discomfort.  3. Gastritis, bile acid reflux Switch to Maalox or Mylanta may be helpful. Consider Carafate if there are still problems in the future.  4. Dysphagia, pharyngoesophageal phase Asymptomatic  5. Hyperglycemia Controlled  6. Essential hypertension Controlled  7. Vitamin D deficiency Intended 5000 units daily of vitamin D. Stopping Nexium may help resolve this problem.

## 2015-02-11 NOTE — Progress Notes (Signed)
Patient ID: Grayland Daisey, male   DOB: Jul 02, 1951, 64 y.o.   MRN: [id number]     Facility  [facility]    Place of Service:   OFFICE    Allergies  Allergen Reactions  . Codeine Other (See Comments)    Headache  . Lipitor [Atorvastatin] Other (See Comments)    Leg pain/cramps  . Red Yeast Rice [Cholestin] Other (See Comments)    Makes legs hurt  . Statins Other (See Comments)    "muscle break down"  . Latex Rash    Chief Complaint  Patient presents with  . Medical Management of Chronic Issues    HPI:  Obesity (BMI 30-39.9): Weight up to 267 today. Has been 250s, up to 265 over last 2 years. Weight of 238 on 09/23/14 likely not accurate. Has been having difficulty losing weight over last few years. Currently attempting to limit carbohydrates, has stopped drinking sodas altogether and is watching overall intake.  Gastroesophageal reflux disease without esophagitis: Taking Nexium BID. Despite PPI having burning abdominal pain following meals. Is worse when eats fruits and vegetables, but is associated with all po intake.   Dysphagia, pharyngoesophageal phase: history of, denies current issues with swallowing. GI is following.  Hyperglycemia: A1C 5.9. Fasting glucose 96.   Essential hypertension: Well controlled  Vitamin D deficiency: Recently started on Vit D supplements by GI. Vit D borderline low today.  Medications: Patient's Medications  New Prescriptions   No medications on file  Previous Medications   AMLODIPINE (NORVASC) 5 MG TABLET    Take 1.5 tablets (7.5 mg total) by mouth every morning.   ASPIRIN EC 81 MG TABLET    Take 81 mg by mouth every morning.   BENAZEPRIL (LOTENSIN) 40 MG TABLET    Take 1 tablet (40 mg total) by mouth every morning.   DORZOLAMIDE-TIMOLOL (COSOPT) 22.3-6.8 MG/ML OPHTHALMIC SOLUTION    Place 1 drop into the right eye 2 (two) times daily.   ESOMEPRAZOLE (NEXIUM) 40 MG CAPSULE    Take 1 capsule (40 mg total) by mouth daily before breakfast. For  reflux   EZETIMIBE (ZETIA) 10 MG TABLET    Take 1 tablet (10 mg total) by mouth every morning.   FINASTERIDE (PROSCAR) 5 MG TABLET    Take 1 tablet (5 mg total) by mouth every morning.   HYDROCHLOROTHIAZIDE (HYDRODIURIL) 25 MG TABLET    Take 1 tablet (25 mg total) by mouth every morning.   HYDROCODONE-ACETAMINOPHEN (NORCO) 10-325 MG PER TABLET    Take 2 tablets by mouth every 6 (six) hours as needed for moderate pain.   KETOROLAC (ACULAR) 0.4 % SOLN    Place 1 drop into the right eye 4 (four) times daily.   LATANOPROST (XALATAN) 0.005 % OPHTHALMIC SOLUTION    Place 1 drop into the right eye at bedtime. One drop once daily into the right eye.   METHOCARBAMOL (ROBAXIN) 500 MG TABLET    Take 500 mg by mouth every 6 (six) hours as needed for muscle spasms.   MICONAZOLE NITRATE 2 % SOLN    Apply 1 application topically 2 (two) times daily. Apply to affected nail   NITROGLYCERIN (NITROSTAT) 0.4 MG SL TABLET    Place 1 tablet (0.4 mg total) under the tongue every 5 (five) minutes as needed for chest pain.   OMEGA-3 ACID ETHYL ESTERS (LOVAZA) 1 G CAPSULE    Take 1 g by mouth 2 (two) times daily.   OXYCODONE-ACETAMINOPHEN (PERCOCET/ROXICET) 5-325 MG PER TABLET  Take 2 tablets by mouth every 4 (four) hours as needed for severe pain.   VITAMIN D, ERGOCALCIFEROL, (DRISDOL) 50000 UNITS CAPS CAPSULE    Take 50,000 Units by mouth every 7 (seven) days.   ZOSTER VACCINE LIVE, PF, (ZOSTAVAX) 74259 UNT/0.65ML INJECTION    Inject 19,400 Units into the skin once.  Modified Medications   No medications on file  Discontinued Medications   No medications on file     Review of Systems  Constitutional: Negative for activity change and appetite change.       Despite much encouragement, he has not been successful in weight loss.  HENT: Positive for tinnitus.   Eyes: Negative.   Respiratory: Negative for cough and chest tightness.   Cardiovascular: Negative for chest pain, palpitations and leg swelling.    Gastrointestinal: Positive for abdominal pain. Negative for nausea, diarrhea, constipation, blood in stool and abdominal distention.       History of mild dysphagia with food sticking in the chest periodically. Abdominal discomfort following meals  Endocrine: Negative.   Genitourinary: Negative.   Musculoskeletal: Positive for back pain, arthralgias and neck pain.       Low back discomfort. Hip pains. Neck pains.  Neurological: Negative.   Hematological: Negative.   Psychiatric/Behavioral: Negative.     Filed Vitals:   02/11/15 1602  BP: 138/78  Pulse: 69  Temp: 98.1 F (36.7 C)  TempSrc: Oral  Resp: 20  Height: 5\' 10"  (1.778 m)  Weight: 267 lb (121.11 kg)  SpO2: 96%   Body mass index is 38.31 kg/(m^2).  Physical Exam  Constitutional: He is oriented to person, place, and time.  Obese middle-aged male.  HENT:  Head: Normocephalic and atraumatic.  Left Ear: External ear normal.  Nose: Nose normal.  Prescription lenses  Eyes: EOM are normal. Pupils are equal, round, and reactive to light. Right conjunctiva is injected.  Neck: Normal range of motion. No JVD present. No tracheal deviation present. No thyromegaly present.  Moderate stiffness of the neck.  Cardiovascular: Normal rate, regular rhythm, normal heart sounds and intact distal pulses.  Exam reveals no gallop and no friction rub.   No murmur heard. Pulmonary/Chest: No respiratory distress. He has no wheezes. He has no rales. He exhibits no tenderness.  Abdominal: Soft. Bowel sounds are normal. He exhibits no distension and no mass. There is no tenderness.  Musculoskeletal: Normal range of motion. He exhibits no edema or tenderness.  Pain in lower back and hips with movement. Pain in the back with flexion, extension, and rotational movements of the head. Stiffness of bilateral knees and thighs  Lymphadenopathy:    He has no cervical adenopathy.  Neurological: He is alert and oriented to person, place, and time. No  cranial nerve deficit. Coordination normal.  Skin: Skin is warm and dry. No rash noted. No erythema. No pallor.  Ganglion of the left index finger. Onychomycosis of the left great toenail. Dermatofibroma medial right thigh. Slightly tender to touch.  Psychiatric: He has a normal mood and affect. His behavior is normal. Judgment and thought content normal.     Labs reviewed: Appointment on 02/06/2015  Component Date Value Ref Range Status  . Glucose 02/06/2015 96  65 - 99 mg/dL Final  . BUN 02/06/2015 19  8 - 27 mg/dL Final  . Creatinine, Ser 02/06/2015 0.93  0.76 - 1.27 mg/dL Final  . GFR calc non Af Amer 02/06/2015 85  >59 mL/min/1.73 Final  . GFR calc Af Amer 02/06/2015 99  >  59 mL/min/1.73 Final  . BUN/Creatinine Ratio 02/06/2015 20  10 - 22 Final  . Sodium 02/06/2015 143  134 - 144 mmol/L Final  . Potassium 02/06/2015 3.9  3.5 - 5.2 mmol/L Final  . Chloride 02/06/2015 104  97 - 108 mmol/L Final  . CO2 02/06/2015 26  18 - 29 mmol/L Final  . Calcium 02/06/2015 9.1  8.6 - 10.2 mg/dL Final  . Hgb A1c MFr Bld 02/06/2015 5.9* 4.8 - 5.6 % Final   Comment:          Pre-diabetes: 5.7 - 6.4          Diabetes: >6.4          Glycemic control for adults with diabetes: <7.0   . Est. average glucose Bld gHb Est-m* 02/06/2015 123   Final  . Cholesterol, Total 02/06/2015 162  100 - 199 mg/dL Final  . Triglycerides 02/06/2015 185* 0 - 149 mg/dL Final  . HDL 02/06/2015 40  >39 mg/dL Final   Comment: According to ATP-III Guidelines, HDL-C >59 mg/dL is considered a negative risk factor for CHD.   Marland Kitchen VLDL Cholesterol Cal 02/06/2015 37  5 - 40 mg/dL Final  . LDL Calculated 02/06/2015 85  0 - 99 mg/dL Final  . Chol/HDL Ratio 02/06/2015 4.1  0.0 - 5.0 ratio units Final   Comment:                                   T. Chol/HDL Ratio                                             Men  Women                               1/2 Avg.Risk  3.4    3.3                                   Avg.Risk  5.0    4.4                                 2X Avg.Risk  9.6    7.1                                3X Avg.Risk 23.4   11.0   . Vit D, 25-Hydroxy 02/06/2015 29.8* 30.0 - 100.0 ng/mL Final   Comment: Vitamin D deficiency has been defined by the [place] practice guideline as a level of serum 25-OH vitamin D less than 20 ng/mL (1,2). The Endocrine Society went on to further define vitamin D insufficiency as a level between 21 and 29 ng/mL (2). 1. IOM (Institute of Medicine). 2010. Dietary reference    intakes for calcium and D. [place]: The    [employer]. 2. Holick MF, Binkley [place], Bischoff-Ferrari HA, et al.    Evaluation, treatment, and prevention of vitamin D    deficiency: an Endocrine Society clinical practice    guideline. JCEM. 2011 Jul; 96(7):1911-30.  Assessment/Plan 1. Obesity (BMI 30-39.9) - will attempt to increase physical activity, discussed using stationary bike for safety secondary to bilateral knee and hip replacements and weight machines for quad strengthening - limited calorie diet discussed at length, will attempt to make improvements  2. Gastroesophageal reflux disease without esophagitis - Taking 80 mg Nexium BID, symptoms continue, worse after meals  - suspect Bile acid Reflux  3. Gastritis, bile acid reflux -stop Nexium and trial Mylanta or Maalox prior to breakfast and dinner for 2 weeks  4. Dysphagia, pharyngoesophageal phase Resolved  5. Hyperglycemia Diet controlled  6. Essential hypertension Stable  7. Vitamin D Deficiency - likely secondary to Nexium - continue Vit D supplements, 5000 units daily

## 2015-02-11 NOTE — Patient Instructions (Signed)
Try stopping Nexium. Substitute Maalox or Mylanta 1-2 tablespoons after eating. Try the 1000 calorie diet.

## 2015-05-20 ENCOUNTER — Encounter: Payer: Self-pay | Admitting: Internal Medicine

## 2015-05-20 ENCOUNTER — Ambulatory Visit (INDEPENDENT_AMBULATORY_CARE_PROVIDER_SITE_OTHER): Payer: PPO | Admitting: Internal Medicine

## 2015-05-20 VITALS — BP 138/80 | HR 86 | Temp 97.9°F | Resp 20 | Ht 70.0 in | Wt 260.4 lb

## 2015-05-20 DIAGNOSIS — R972 Elevated prostate specific antigen [PSA]: Secondary | ICD-10-CM

## 2015-05-20 DIAGNOSIS — R739 Hyperglycemia, unspecified: Secondary | ICD-10-CM

## 2015-05-20 DIAGNOSIS — I10 Essential (primary) hypertension: Secondary | ICD-10-CM | POA: Diagnosis not present

## 2015-05-20 DIAGNOSIS — R252 Cramp and spasm: Secondary | ICD-10-CM

## 2015-05-20 DIAGNOSIS — Z23 Encounter for immunization: Secondary | ICD-10-CM | POA: Diagnosis not present

## 2015-05-20 DIAGNOSIS — E669 Obesity, unspecified: Secondary | ICD-10-CM

## 2015-05-20 DIAGNOSIS — E559 Vitamin D deficiency, unspecified: Secondary | ICD-10-CM | POA: Diagnosis not present

## 2015-05-20 DIAGNOSIS — M1990 Unspecified osteoarthritis, unspecified site: Secondary | ICD-10-CM

## 2015-05-20 DIAGNOSIS — M542 Cervicalgia: Secondary | ICD-10-CM

## 2015-05-20 DIAGNOSIS — E785 Hyperlipidemia, unspecified: Secondary | ICD-10-CM

## 2015-05-20 NOTE — Progress Notes (Signed)
Patient ID: Anthony [name], male   DOB: 1951-02-23, 64 y.o.   MRN: [id number]    Facility  [facility]    Place of Service:   OFFICE    Allergies  Allergen Reactions  . Codeine Other (See Comments)    Headache  . Lipitor [Atorvastatin] Other (See Comments)    Leg pain/cramps  . Red Yeast Rice [Cholestin] Other (See Comments)    Makes legs hurt  . Statins Other (See Comments)    "muscle break down"  . Latex Rash    Chief Complaint  Patient presents with  . Medical Management of Chronic Issues    HPI:  Mother died at [facility] after 17 years.  Essential hypertension - controlled  Hyperglycemia - no recent lab  Vitamin D deficiency - on supplement  Obesity (BMI 30-39.9) - weight down 7# since June 2016.  Cramp of limb - seemed worse in the last week  Osteoarthritis, unspecified osteoarthritis type, unspecified site - chronic neck pain. Pain in both legs doen the front and laterally  Hyperlipidemia - no recent lab  Need for prophylactic vaccination and inoculation against influenza - Plan: Flu Vaccine QUAD 36+ mos PF IM (Fluarix & Fluzone Quad PF)    Medications: Patient's Medications  New Prescriptions   No medications on file  Previous Medications   ALPHAGAN P 0.1 % SOLN    PLACE 1 DROP INTO THE RIGHT EYE 2 (TWO) TIMES DAILY.   AMLODIPINE (NORVASC) 5 MG TABLET    Take 1.5 tablets (7.5 mg total) by mouth every morning.   ASPIRIN EC 81 MG TABLET    Take 81 mg by mouth every morning.   BENAZEPRIL (LOTENSIN) 40 MG TABLET    Take 1 tablet (40 mg total) by mouth every morning.   DORZOLAMIDE-TIMOLOL (COSOPT) 22.3-6.8 MG/ML OPHTHALMIC SOLUTION    Place 1 drop into the right eye 2 (two) times daily.   ESOMEPRAZOLE (NEXIUM) 40 MG CAPSULE    Take 1 capsule (40 mg total) by mouth daily before breakfast. For reflux   EZETIMIBE (ZETIA) 10 MG TABLET    Take 1 tablet (10 mg total) by mouth every morning.   FINASTERIDE (PROSCAR) 5 MG TABLET    Take 1 tablet (5 mg total) by mouth every  morning.   HYDROCHLOROTHIAZIDE (HYDRODIURIL) 25 MG TABLET    Take 1 tablet (25 mg total) by mouth every morning.   HYDROCODONE-ACETAMINOPHEN (NORCO) 10-325 MG PER TABLET    Take 2 tablets by mouth every 6 (six) hours as needed for moderate pain.   KETOROLAC (ACULAR) 0.4 % SOLN    Place 1 drop into the right eye 4 (four) times daily.   LATANOPROST (XALATAN) 0.005 % OPHTHALMIC SOLUTION    Place 1 drop into the right eye at bedtime. One drop once daily into the right eye.   METHOCARBAMOL (ROBAXIN) 500 MG TABLET    Take 500 mg by mouth every 6 (six) hours as needed for muscle spasms.   MICONAZOLE NITRATE 2 % SOLN    Apply 1 application topically 2 (two) times daily. Apply to affected nail   NITROGLYCERIN (NITROSTAT) 0.4 MG SL TABLET    Place 1 tablet (0.4 mg total) under the tongue every 5 (five) minutes as needed for chest pain.   OMEGA-3 ACID ETHYL ESTERS (LOVAZA) 1 G CAPSULE    Take 1 g by mouth 2 (two) times daily.   OXYCODONE-ACETAMINOPHEN (PERCOCET/ROXICET) 5-325 MG PER TABLET    Take 2 tablets by mouth every 4 (  four) hours as needed for severe pain.   VITAMIN D, ERGOCALCIFEROL, (DRISDOL) 50000 UNITS CAPS CAPSULE    Take 50,000 Units by mouth every 7 (seven) days.   ZOSTER VACCINE LIVE, PF, (ZOSTAVAX) 17408 UNT/0.65ML INJECTION    Inject 19,400 Units into the skin once.  Modified Medications   No medications on file  Discontinued Medications   No medications on file     Review of Systems  Constitutional: Negative for activity change and appetite change.       Despite much encouragement, he has not been successful in weight loss.  HENT: Positive for tinnitus.   Eyes: Negative.   Respiratory: Negative for cough and chest tightness.   Cardiovascular: Negative for chest pain, palpitations and leg swelling.  Gastrointestinal: Positive for abdominal pain. Negative for nausea, diarrhea, constipation, blood in stool and abdominal distention.       History of mild dysphagia with food sticking in  the chest periodically. Abdominal discomfort following meals  Endocrine: Negative.   Genitourinary: Negative.   Musculoskeletal: Positive for back pain, arthralgias and neck pain.       Low back discomfort. Hip pains. Neck pains.  Neurological: Negative.   Hematological: Negative.   Psychiatric/Behavioral: Negative.     Filed Vitals:   05/20/15 1222  BP: 138/80  Pulse: 86  Temp: 97.9 F (36.6 C)  TempSrc: Oral  Resp: 20  Height: 5' 10" (1.778 m)  Weight: 260 lb 6.4 oz (118.117 kg)  SpO2: 96%   Body mass index is 37.36 kg/(m^2).  Physical Exam  Constitutional: He is oriented to person, place, and time.  Obese middle-aged male.  HENT:  Head: Normocephalic and atraumatic.  Left Ear: External ear normal.  Nose: Nose normal.  Prescription lenses  Eyes: EOM are normal. Pupils are equal, round, and reactive to light. Right conjunctiva is injected.  Neck: Normal range of motion. No JVD present. No tracheal deviation present. No thyromegaly present.  Moderate stiffness of the neck.  Cardiovascular: Normal rate, regular rhythm, normal heart sounds and intact distal pulses.  Exam reveals no gallop and no friction rub.   No murmur heard. Pulmonary/Chest: No respiratory distress. He has no wheezes. He has no rales. He exhibits no tenderness.  Abdominal: Soft. Bowel sounds are normal. He exhibits no distension and no mass. There is no tenderness.  Musculoskeletal: Normal range of motion. He exhibits no edema or tenderness.  Pain in lower back and hips with movement. Pain in the back with flexion, extension, and rotational movements of the head. Stiffness of bilateral knees and thighs  Lymphadenopathy:    He has no cervical adenopathy.  Neurological: He is alert and oriented to person, place, and time. No cranial nerve deficit. Coordination normal.  Skin: Skin is warm and dry. No rash noted. No erythema. No pallor.  Ganglion of the left index finger. Onychomycosis of the left great  toenail. Dermatofibroma medial right thigh. Slightly tender to touch.  Psychiatric: He has a normal mood and affect. His behavior is normal. Judgment and thought content normal.     Labs reviewed: Lab Summary Latest Ref Rng 02/06/2015 07/28/2014 02/24/2014 11/03/2013 11/03/2013  Hemoglobin 13.0 - 17.0 g/dL (None) (None) (None) 13.9 14.0  Hematocrit 39.0 - 52.0 % (None) (None) (None) 41.0 40.1  White count 4.0 - 10.5 K/uL (None) (None) (None) (None) 15.3(H)  Platelet count 150 - 400 K/uL (None) (None) (None) (None) 194  Sodium 134 - 144 mmol/L 143 143 142 142 139  Potassium 3.5 - 5.2  mmol/L 3.9 4.0 4.3 3.6(L) 3.9  Calcium 8.6 - 10.2 mg/dL 9.1 9.2 9.6 (None) 9.6  Phosphorus - (None) (None) (None) (None) (None)  Creatinine 0.76 - 1.27 mg/dL 0.93 0.86 0.90 1.50(H) 1.33  AST 0 - 40 IU/L (None) (None) 12 (None) 36  Alk Phos 39 - 117 IU/L (None) (None) 69 (None) 67  Bilirubin 0.0 - 1.2 mg/dL (None) (None) 0.5 (None) 0.3  Glucose 65 - 99 mg/dL 96 100(H) 90 119(H) 123(H)  Cholesterol - (None) (None) (None) (None) (None)  HDL cholesterol >39 mg/dL 40 39(L) 40 (None) (None)  Triglycerides 0 - 149 mg/dL 185(H) 178(H) 212(H) (None) (None)  LDL Direct - (None) (None) (None) (None) (None)  LDL Calc 0 - 99 mg/dL 85 115(H) 98 (None) (None)  Total protein 6.0 - 8.3 g/dL (None) (None) (None) (None) 6.4  Albumin 3.6 - 4.8 g/dL (None) (None) 4.3 (None) 4.2   No results found for: TSH, T3TOTAL, T4TOTAL, THYROIDAB Lab Results  Component Value Date   BUN 19 02/06/2015   Lab Results  Component Value Date   HGBA1C 5.9* 02/06/2015    Assessment/Plan  1. Hyperglycemia - Hemoglobin A1c; Future - Comprehensive metabolic panel; Future  2. Vitamin D deficiency Continue supplement  3. Obesity (BMI 30-39.9) Continue weight loss  4. Essential hypertension Controlled  5. Cramp of limb - CK, future  6. Osteoarthritis, unspecified osteoarthritis type, unspecified site Tylenol as needed  7.  Hyperlipidemia - Lipid panel; Future  8. Need for prophylactic vaccination and inoculation against influenza - Flu Vaccine QUAD 36+ mos PF IM (Fluarix & Fluzone Quad PF)  9. Elevated prostate specific antigen (PSA) Repeat in the future  10. Cervicalgia He was told he might need surgery with fusion in the past, but he refused to have surgery at this point.

## 2015-08-10 ENCOUNTER — Other Ambulatory Visit: Payer: Self-pay | Admitting: Internal Medicine

## 2015-09-21 ENCOUNTER — Other Ambulatory Visit: Payer: PPO

## 2015-09-21 DIAGNOSIS — E785 Hyperlipidemia, unspecified: Secondary | ICD-10-CM | POA: Diagnosis not present

## 2015-09-21 DIAGNOSIS — R739 Hyperglycemia, unspecified: Secondary | ICD-10-CM

## 2015-09-21 DIAGNOSIS — R252 Cramp and spasm: Secondary | ICD-10-CM | POA: Diagnosis not present

## 2015-09-22 LAB — CK: Total CK: 142 U/L (ref 24–204)

## 2015-09-22 LAB — COMPREHENSIVE METABOLIC PANEL
A/G RATIO: 2.6 — AB (ref 1.1–2.5)
ALT: 21 IU/L (ref 0–44)
AST: 18 IU/L (ref 0–40)
Albumin: 4.2 g/dL (ref 3.6–4.8)
Alkaline Phosphatase: 68 IU/L (ref 39–117)
BUN/Creatinine Ratio: 15 (ref 10–22)
BUN: 15 mg/dL (ref 8–27)
Bilirubin Total: 0.3 mg/dL (ref 0.0–1.2)
CO2: 23 mmol/L (ref 18–29)
Calcium: 9.2 mg/dL (ref 8.6–10.2)
Chloride: 104 mmol/L (ref 96–106)
Creatinine, Ser: 0.97 mg/dL (ref 0.76–1.27)
GFR calc non Af Amer: 81 mL/min/{1.73_m2} (ref >59)
GFR, EST AFRICAN AMERICAN: 94 mL/min/{1.73_m2} (ref >59)
GLOBULIN, TOTAL: 1.6 g/dL (ref 1.5–4.5)
Glucose: 105 mg/dL — ABNORMAL HIGH (ref 65–99)
POTASSIUM: 3.9 mmol/L (ref 3.5–5.2)
SODIUM: 144 mmol/L (ref 134–144)
TOTAL PROTEIN: 5.8 g/dL — AB (ref 6.0–8.5)

## 2015-09-22 LAB — HEMOGLOBIN A1C
Est. average glucose Bld gHb Est-mCnc: 114 mg/dL
Hgb A1c MFr Bld: 5.6 % (ref 4.8–5.6)

## 2015-09-22 LAB — LIPID PANEL
Chol/HDL Ratio: 4.9 ratio units (ref 0.0–5.0)
Cholesterol, Total: 180 mg/dL (ref 100–199)
HDL: 37 mg/dL — AB (ref >39)
LDL CALC: 116 mg/dL — AB (ref 0–99)
TRIGLYCERIDES: 137 mg/dL (ref 0–149)
VLDL Cholesterol Cal: 27 mg/dL (ref 5–40)

## 2015-09-23 ENCOUNTER — Ambulatory Visit (INDEPENDENT_AMBULATORY_CARE_PROVIDER_SITE_OTHER): Payer: PPO | Admitting: Internal Medicine

## 2015-09-23 ENCOUNTER — Encounter: Payer: Self-pay | Admitting: Internal Medicine

## 2015-09-23 ENCOUNTER — Other Ambulatory Visit: Payer: Self-pay | Admitting: Internal Medicine

## 2015-09-23 VITALS — BP 130/90 | HR 68 | Temp 97.4°F | Resp 20 | Ht 70.0 in | Wt 263.8 lb

## 2015-09-23 DIAGNOSIS — E669 Obesity, unspecified: Secondary | ICD-10-CM | POA: Diagnosis not present

## 2015-09-23 DIAGNOSIS — N400 Enlarged prostate without lower urinary tract symptoms: Secondary | ICD-10-CM | POA: Diagnosis not present

## 2015-09-23 DIAGNOSIS — R072 Precordial pain: Secondary | ICD-10-CM

## 2015-09-23 DIAGNOSIS — R1314 Dysphagia, pharyngoesophageal phase: Secondary | ICD-10-CM | POA: Diagnosis not present

## 2015-09-23 DIAGNOSIS — I10 Essential (primary) hypertension: Secondary | ICD-10-CM

## 2015-09-23 DIAGNOSIS — E785 Hyperlipidemia, unspecified: Secondary | ICD-10-CM

## 2015-09-23 DIAGNOSIS — R079 Chest pain, unspecified: Secondary | ICD-10-CM | POA: Insufficient documentation

## 2015-09-23 DIAGNOSIS — M542 Cervicalgia: Secondary | ICD-10-CM | POA: Diagnosis not present

## 2015-09-23 DIAGNOSIS — R739 Hyperglycemia, unspecified: Secondary | ICD-10-CM | POA: Diagnosis not present

## 2015-09-23 MED ORDER — NITROGLYCERIN 0.4 MG SL SUBL: 0.4000 mg | SUBLINGUAL_TABLET | SUBLINGUAL | Status: DC | PRN

## 2015-09-23 NOTE — Progress Notes (Signed)
Patient ID: Anthony [name], male   DOB: 1951-03-23, 65 y.o.   MRN: [id number]    Facility  [facility]    Place of Service:   OFFICE    Allergies  Allergen Reactions  . Codeine Other (See Comments)    Headache  . Lipitor [Atorvastatin] Other (See Comments)    Leg pain/cramps  . Red Yeast Rice [Cholestin] Other (See Comments)    Makes legs hurt  . Statins Other (See Comments)    "muscle break down"  . Latex Rash    Chief Complaint  Patient presents with  . Medical Management of Chronic Issues    4 month follow-up for Hypertension, Hyperglycemia ,labs printed  . OTHER    Patirnts c/o Is he has been having tightness in chest and short of breathoff and on for last week  is having now     HPI:  Has started an exercise program at the gym.  Precordial pain - present precordial chest pain off and on several occasions. Although he never has used his nitroglycerin, he does have a prescription for that that is over-year-old. It is not accompanied by diaphoresis but he has noticed some shortness of breath. Shortness of breath is not always accompanied by chest discomfort. There is no nausea. Dysphagia has improved.  Hyperglycemia - controlled  Hyperlipidemia - controlled   Essential hypertension - controlled  Obesity (BMI 30-39.9) - patient has gained some weight over the holidays   Dysphagia, pharyngoesophageal phase - asymptomatic at present   Cervicalgia -  patient has seen Dr. Maxie Better. Main concern is pain in the right shoulder, but now Dr. Maxie Better feels that it may be coming from his neck. Steroid injection to the shoulder have been of some help. There has been some discomfort and went all way down into the fourth and fifth fingers of right hand. Dr. Tonita Cong in order physical therapy. Patient is doing exercises and the discomfort in the fingers has improved and frozen.  BPH (benign prostatic hyperplasia) - nocturia 1 .denies hesitation or frequency during the day. Remains on finasteride  .    Medications: Patient's Medications  New Prescriptions   No medications on file  Previous Medications   ALPHAGAN P 0.1 % SOLN    PLACE 1 DROP INTO THE RIGHT EYE 2 (TWO) TIMES DAILY.   AMLODIPINE (NORVASC) 5 MG TABLET    Take 1.5 tablets (7.5 mg total) by mouth every morning.   ASPIRIN EC 81 MG TABLET    Take 81 mg by mouth every morning.   BENAZEPRIL (LOTENSIN) 40 MG TABLET    Take 1 tablet (40 mg total) by mouth every morning.   DORZOLAMIDE-TIMOLOL (COSOPT) 22.3-6.8 MG/ML OPHTHALMIC SOLUTION    Place 1 drop into the right eye 2 (two) times daily.   ESOMEPRAZOLE (NEXIUM) 40 MG CAPSULE    Take 1 capsule (40 mg total) by mouth daily before breakfast. For reflux   FINASTERIDE (PROSCAR) 5 MG TABLET    Take 1 tablet (5 mg total) by mouth every morning.   HYDROCHLOROTHIAZIDE (HYDRODIURIL) 25 MG TABLET    Take 1 tablet (25 mg total) by mouth every morning.   HYDROCODONE-ACETAMINOPHEN (NORCO) 10-325 MG PER TABLET    Take 2 tablets by mouth every 6 (six) hours as needed for moderate pain.   KETOROLAC (ACULAR) 0.4 % SOLN    Place 1 drop into the right eye 4 (four) times daily.   LATANOPROST (XALATAN) 0.005 % OPHTHALMIC SOLUTION    Place 1 drop  into the right eye at bedtime. One drop once daily into the right eye.   METHOCARBAMOL (ROBAXIN) 500 MG TABLET    Take 500 mg by mouth every 6 (six) hours as needed for muscle spasms.   MICONAZOLE NITRATE 2 % SOLN    Apply 1 application topically 2 (two) times daily. Apply to affected nail   NITROGLYCERIN (NITROSTAT) 0.4 MG SL TABLET    Place 1 tablet (0.4 mg total) under the tongue every 5 (five) minutes as needed for chest pain.   OMEGA-3 ACID ETHYL ESTERS (LOVAZA) 1 G CAPSULE    Take 1 g by mouth 2 (two) times daily.   OXYCODONE-ACETAMINOPHEN (PERCOCET/ROXICET) 5-325 MG PER TABLET    Take 2 tablets by mouth every 4 (four) hours as needed for severe pain.   VITAMIN D, ERGOCALCIFEROL, (DRISDOL) 50000 UNITS CAPS CAPSULE    Take 50,000 Units by mouth every 7  (seven) days.   ZETIA 10 MG TABLET    TAKE 1 TABLET BY MOUTH EVERY MORNING   ZOSTER VACCINE LIVE, PF, (ZOSTAVAX) 92119 UNT/0.65ML INJECTION    Inject 19,400 Units into the skin once.  Modified Medications   No medications on file  Discontinued Medications   No medications on file    Review of Systems  Constitutional: Negative for activity change and appetite change.       Despite much encouragement, he has not been successful in weight loss.  HENT: Positive for tinnitus.   Eyes: Negative.   Respiratory: Negative for cough and chest tightness.   Cardiovascular: Negative for chest pain, palpitations and leg swelling.  Gastrointestinal: Positive for abdominal pain. Negative for nausea, diarrhea, constipation, blood in stool and abdominal distention.       History of mild dysphagia with food sticking in the chest periodically. Abdominal discomfort following meals  Endocrine: Negative.   Genitourinary: Negative.   Musculoskeletal: Positive for back pain, arthralgias and neck pain.       Low back discomfort. Hip pains. Neck pains.  Neurological: Negative.   Hematological: Negative.   Psychiatric/Behavioral: Negative.     Filed Vitals:   09/23/15 1158  BP: 130/90  Pulse: 68  Temp: 97.4 F (36.3 C)  TempSrc: Oral  Resp: 20  Height: _0  (1.778 m)  Weight: 263 lb 12.8 oz (119.659 kg)  SpO2: 97%   Body mass index is 37.85 kg/(m^2). Filed Weights   09/23/15 1158  Weight: 263 lb 12.8 oz (119.659 kg)     Physical Exam  Constitutional: He is oriented to person, place, and time.  Obese middle-aged male.  HENT:  Head: Normocephalic and atraumatic.  Left Ear: External ear normal.  Nose: Nose normal.  Prescription lenses  Eyes: EOM are normal. Pupils are equal, round, and reactive to light. Right conjunctiva is injected.  Neck: Normal range of motion. No JVD present. No tracheal deviation present. No thyromegaly present.  Moderate stiffness of the neck.  Cardiovascular:  Normal rate, regular rhythm, normal heart sounds and intact distal pulses.  Exam reveals no gallop and no friction rub.   No murmur heard. Pulmonary/Chest: No respiratory distress. He has no wheezes. He has no rales. He exhibits no tenderness.  Abdominal: Soft. Bowel sounds are normal. He exhibits no distension and no mass. There is no tenderness.  Musculoskeletal: Normal range of motion. He exhibits no edema or tenderness.  Pain in lower back and hips with movement. Pain in the back with flexion, extension, and rotational movements of the head. Stiffness of bilateral knees  and thighs  Lymphadenopathy:    He has no cervical adenopathy.  Neurological: He is alert and oriented to person, place, and time. No cranial nerve deficit. Coordination normal.  Skin: Skin is warm and dry. No rash noted. No erythema. No pallor.  Ganglion of the left index finger. Onychomycosis of the left great toenail. Dermatofibroma medial right thigh. Slightly tender to touch.  Psychiatric: He has a normal mood and affect. His behavior is normal. Judgment and thought content normal.    Labs reviewed: Lab Summary Latest Ref Rng 09/21/2015 02/06/2015 07/28/2014 02/24/2014  Hemoglobin 13.0-17.0 g/dL (None) (None) (None) (None)  Hematocrit 39.0-52.0 % (None) (None) (None) (None)  White count - (None) (None) (None) (None)  Platelet count - (None) (None) (None) (None)  Sodium 134 - 144 mmol/L 144 143 143 142  Potassium 3.5 - 5.2 mmol/L 3.9 3.9 4.0 4.3  Calcium 8.6 - 10.2 mg/dL 9.2 9.1 9.2 9.6  Phosphorus - (None) (None) (None) (None)  Creatinine 0.76 - 1.27 mg/dL 0.97 0.93 0.86 0.90  AST 0 - 40 IU/L 18 (None) (None) 12  Alk Phos 39 - 117 IU/L 68 (None) (None) 69  Bilirubin 0.0 - 1.2 mg/dL 0.3 (None) (None) 0.5  Glucose 65 - 99 mg/dL 105(H) 96 100(H) 90  Cholesterol - (None) (None) (None) (None)  HDL cholesterol >39 mg/dL 37(L) 40 39(L) 40  Triglycerides 0 - 149 mg/dL 137 185(H) 178(H) 212(H)  LDL Direct - (None) (None)  (None) (None)  LDL Calc 0 - 99 mg/dL 116(H) 85 115(H) 98  Total protein - (None) (None) (None) (None)  Albumin 3.6 - 4.8 g/dL 4.2 (None) (None) 4.3   No results found for: TSH, T3TOTAL, T4TOTAL, THYROIDAB Lab Results  Component Value Date   BUN 15 09/21/2015   BUN 19 02/06/2015   BUN 17 07/28/2014   Lab Results  Component Value Date   HGBA1C 5.6 09/21/2015   HGBA1C 5.9* 02/06/2015   HGBA1C 5.7* 07/28/2014    09/23/2015 EKG: Rate 65. Normal sinus rhythm. No ST or T-wave changes. Normal EKG.  Assessment/Plan 1. Precordial pain - nitroGLYCERIN (NITROSTAT) 0.4 MG SL tablet; Place 1 tablet (0.4 mg total) under the tongue every 5 (five) minutes as needed for chest pain.  Dispense: 25 tablet; Refill: 2  2. Hyperglycemia - Comprehensive metabolic panel; Future  3. Hyperlipidemia - Lipid panel; Future  4. Essential hypertension - Comprehensive metabolic panel; Future  5. Obesity (BMI 30-39.9) Strongly encouraged to lose weight. Discussed diet. Patient is going to a new exercise situation by joining a gym.   6. Dysphagia, pharyngoesophageal phase Asymptomatic   7. Cervicalgia [place] neck discomfort that has improved. Has consulted with Dr. Audie Pinto, orthopedist. Currently involved in physical therapy. There has been some radicular symptoms with pain down the right arm into the fourth and fifth fingers.   8. BPH (benign prostatic hyperplasia)   Improved

## 2015-09-24 ENCOUNTER — Other Ambulatory Visit: Payer: Self-pay | Admitting: Internal Medicine

## 2015-09-29 ENCOUNTER — Ambulatory Visit (INDEPENDENT_AMBULATORY_CARE_PROVIDER_SITE_OTHER): Payer: PPO | Admitting: Podiatry

## 2015-09-29 VITALS — BP 171/92 | HR 66 | Resp 14

## 2015-09-29 DIAGNOSIS — L600 Ingrowing nail: Secondary | ICD-10-CM

## 2015-09-29 NOTE — Progress Notes (Signed)
Subjective:     Patient ID: Anthony [name], male   DOB: Dec 20, 1950, 65 y.o.   MRN: [id number]  HPI patient presents stating that my left big toenail is really bothering me on the lateral side and making it hard to wear shoe gear comfortably and I did have it removed by family physician couple years ago   Review of Systems  All other systems reviewed and are negative.      Objective:   Physical Exam  Constitutional: He is oriented to person, place, and time.  Cardiovascular: Intact distal pulses.   Musculoskeletal: Normal range of motion.  Neurological: He is oriented to person, place, and time.  Skin: Skin is warm.  Nursing note and vitals reviewed.  neurovascular status intact muscle strength adequate range of motion within normal limits with patient noted to have incurvated left hallux lateral border that's painful when pressed and making shoe gear difficult. Patient states that he's tried to trim it himself and soak it and he did have good digital perfusion and is well oriented 3     Assessment:     Ingrown toenail deformity left hallux lateral border with pain    Plan:     H&P conditions reviewed with patient and recommended removal of the lateral corner and a permanent fashion. Patient wants procedure and understands risk and today I infiltrated 60 mg I can Marcaine mixture remove the lateral corner exposed matrix and applied phenol 3 applications 30 seconds followed by alcohol lavage and sterile dressing. Given instructions on soaks and reappoint

## 2015-09-29 NOTE — Patient Instructions (Signed)

## 2015-09-29 NOTE — Progress Notes (Signed)
   Subjective:    Patient ID: Anthony [name], male    DOB: [birth year], 65 y.o.   MRN: [id number]  HPI  The patient presents here with left great toe that is lifting and pain since removal 2 years ago    Review of Systems  Musculoskeletal:       Muscle cramping       Objective:   Physical Exam        Assessment & Plan:

## 2015-10-05 ENCOUNTER — Other Ambulatory Visit: Payer: Self-pay

## 2015-10-05 ENCOUNTER — Encounter: Payer: Self-pay | Admitting: Internal Medicine

## 2015-10-05 DIAGNOSIS — I10 Essential (primary) hypertension: Secondary | ICD-10-CM

## 2015-10-05 MED ORDER — AMLODIPINE BESYLATE 5 MG PO TABS: 7.5000 mg | ORAL_TABLET | Freq: Every morning | ORAL | Status: DC

## 2015-10-06 ENCOUNTER — Telehealth: Payer: Self-pay | Admitting: *Deleted

## 2015-10-06 NOTE — Telephone Encounter (Signed)
Called patient at [phone] (Home #) to check to see how they were doing from their ingrown toenail procedure that was performed on Tuesday, September 29, 2015. Pt stated, "Doing pretty good, but toe is a little sore". I advised patient to take ibuprofen for any pain they are having. Pt is soaking their toe with some relief.

## 2015-11-13 ENCOUNTER — Telehealth: Payer: Self-pay | Admitting: *Deleted

## 2015-11-13 NOTE — Telephone Encounter (Signed)
Patient wife, Anthony [name] called and stated that patient is having Congestion, Cough and Headache, NO fever. Would like to know if something can be called in to relieve his symptoms. Please Advise.

## 2015-11-16 ENCOUNTER — Encounter: Payer: Self-pay | Admitting: Internal Medicine

## 2015-11-17 NOTE — Telephone Encounter (Signed)
I would suggest Mucinex cough and sinus preparation that is available over-the-counter.

## 2015-11-17 NOTE — Telephone Encounter (Signed)
Patient wife notified and agreed.  

## 2015-11-27 DIAGNOSIS — K219 Gastro-esophageal reflux disease without esophagitis: Secondary | ICD-10-CM | POA: Diagnosis not present

## 2015-11-27 DIAGNOSIS — J387 Other diseases of larynx: Secondary | ICD-10-CM | POA: Diagnosis not present

## 2015-12-17 DIAGNOSIS — R479 Unspecified speech disturbances: Secondary | ICD-10-CM | POA: Insufficient documentation

## 2015-12-17 DIAGNOSIS — R1313 Dysphagia, pharyngeal phase: Secondary | ICD-10-CM | POA: Diagnosis not present

## 2015-12-17 DIAGNOSIS — K219 Gastro-esophageal reflux disease without esophagitis: Secondary | ICD-10-CM | POA: Diagnosis not present

## 2015-12-17 DIAGNOSIS — R42 Dizziness and giddiness: Secondary | ICD-10-CM | POA: Diagnosis not present

## 2015-12-17 DIAGNOSIS — H8110 Benign paroxysmal vertigo, unspecified ear: Secondary | ICD-10-CM | POA: Insufficient documentation

## 2015-12-17 DIAGNOSIS — R05 Cough: Secondary | ICD-10-CM | POA: Diagnosis not present

## 2015-12-17 DIAGNOSIS — R490 Dysphonia: Secondary | ICD-10-CM | POA: Diagnosis not present

## 2015-12-22 DIAGNOSIS — H40053 Ocular hypertension, bilateral: Secondary | ICD-10-CM | POA: Diagnosis not present

## 2015-12-23 ENCOUNTER — Telehealth: Payer: Self-pay | Admitting: *Deleted

## 2015-12-23 NOTE — Telephone Encounter (Signed)
Spoke with patient regarding a sick office visit for today(2:05 pm). I informed him that we did not have any openings, if he would be all right with going to the urgent care to be seen.He stated he would go to the urgent care.

## 2015-12-30 ENCOUNTER — Encounter: Payer: Self-pay | Admitting: Podiatry

## 2015-12-30 ENCOUNTER — Ambulatory Visit (INDEPENDENT_AMBULATORY_CARE_PROVIDER_SITE_OTHER): Payer: PPO | Admitting: Podiatry

## 2015-12-30 VITALS — BP 164/80 | HR 82 | Resp 16

## 2015-12-30 DIAGNOSIS — L03032 Cellulitis of left toe: Secondary | ICD-10-CM

## 2015-12-30 DIAGNOSIS — L03012 Cellulitis of left finger: Secondary | ICD-10-CM

## 2015-12-30 DIAGNOSIS — L600 Ingrowing nail: Secondary | ICD-10-CM | POA: Diagnosis not present

## 2015-12-30 NOTE — Progress Notes (Signed)
Subjective:     Patient ID: Anthony [name], male   DOB: [birth year], 65 y.o.   MRN: [id number]  HPI patient states that I been having pain in my left big toe on the lateral side and I'm not sure if there may not be an infection   Review of Systems     Objective:   Physical Exam Neurovascular status intact with incurvation of the left hallux lateral border with localized redness with no distal drainage noted    Assessment:     Localized paronychia infection of the left hallux    Plan:     Infiltrated the left hallux 60 mg Xylocaine Marcaine mixture and remove the lateral border and plug tissue and debrided necrotic tissue. Flushed the area applied sterile dressing and reappoint as needed and instructed on soaks

## 2015-12-30 NOTE — Patient Instructions (Signed)

## 2016-01-08 ENCOUNTER — Telehealth: Payer: Self-pay | Admitting: *Deleted

## 2016-01-08 NOTE — Telephone Encounter (Signed)
Left message for patient at [phone] (Home #) to check to see how they were doing from their Paronychia procedure that was performed on Wednesday, Dec 30, 2015. Waiting for a response.

## 2016-01-25 ENCOUNTER — Other Ambulatory Visit: Payer: PPO

## 2016-01-25 DIAGNOSIS — I10 Essential (primary) hypertension: Secondary | ICD-10-CM | POA: Diagnosis not present

## 2016-01-25 DIAGNOSIS — R739 Hyperglycemia, unspecified: Secondary | ICD-10-CM

## 2016-01-25 DIAGNOSIS — E785 Hyperlipidemia, unspecified: Secondary | ICD-10-CM | POA: Diagnosis not present

## 2016-01-27 ENCOUNTER — Ambulatory Visit (INDEPENDENT_AMBULATORY_CARE_PROVIDER_SITE_OTHER): Payer: PPO | Admitting: Internal Medicine

## 2016-01-27 ENCOUNTER — Telehealth: Payer: Self-pay

## 2016-01-27 ENCOUNTER — Encounter: Payer: Self-pay | Admitting: Internal Medicine

## 2016-01-27 VITALS — BP 130/84 | HR 70 | Temp 98.1°F | Ht 70.0 in | Wt 262.0 lb

## 2016-01-27 DIAGNOSIS — E785 Hyperlipidemia, unspecified: Secondary | ICD-10-CM

## 2016-01-27 DIAGNOSIS — E669 Obesity, unspecified: Secondary | ICD-10-CM | POA: Diagnosis not present

## 2016-01-27 DIAGNOSIS — I10 Essential (primary) hypertension: Secondary | ICD-10-CM | POA: Diagnosis not present

## 2016-01-27 DIAGNOSIS — R739 Hyperglycemia, unspecified: Secondary | ICD-10-CM

## 2016-01-27 DIAGNOSIS — R1314 Dysphagia, pharyngoesophageal phase: Secondary | ICD-10-CM | POA: Diagnosis not present

## 2016-01-27 DIAGNOSIS — K222 Esophageal obstruction: Secondary | ICD-10-CM

## 2016-01-27 MED ORDER — EZETIMIBE 10 MG PO TABS: 10.0000 mg | ORAL_TABLET | Freq: Every morning | ORAL | Status: DC

## 2016-01-27 MED ORDER — FAMOTIDINE 20 MG PO TABS: ORAL_TABLET | ORAL | Status: DC

## 2016-01-27 NOTE — Progress Notes (Signed)
Patient ID: Anthony [name], male   DOB: [birth year], 65 y.o.   MRN: [id number]    Facility  [place]    Place of Service:   OFFICE    Allergies  Allergen Reactions  . Codeine Other (See Comments)    Headache  . Lipitor [Atorvastatin] Other (See Comments)    Leg pain/cramps  . Red Yeast Rice [Cholestin] Other (See Comments)    Makes legs hurt  . Statins Other (See Comments)    "muscle break down"  . Latex Rash    Chief Complaint  Patient presents with  . Medical Management of Chronic Issues    4 month blood pressure, hyperglycemia, cholesterol. Review labs    HPI:   Hyperglycemia - stable  Hyperlipidemia - controlled  Dysphagia, pharyngoesophageal phase - Patient saw Dr. Cristina Gong recently. He had famotidine 20 mg at bedtime. Patient is to continue on Nexium 40 mg each morning.  Esophageal stenosis   Essential hypertension - controlled  Obesity (BMI 30-39.9) - discussed weight loss and how it could improve his health.    Medications: Patient's Medications  New Prescriptions   No medications on file  Previous Medications   ALPHAGAN P 0.1 % SOLN    PLACE 1 DROP INTO THE RIGHT EYE 2 (TWO) TIMES DAILY.   AMLODIPINE (NORVASC) 5 MG TABLET    Take 1.5 tablets (7.5 mg total) by mouth every morning.   ASPIRIN EC 81 MG TABLET    Take 81 mg by mouth every morning.   BENAZEPRIL (LOTENSIN) 40 MG TABLET    TAKE 1 TABLET (40 MG TOTAL) BY MOUTH EVERY MORNING.   DORZOLAMIDE-TIMOLOL (COSOPT) 22.3-6.8 MG/ML OPHTHALMIC SOLUTION    Place 1 drop into the right eye 2 (two) times daily.   ESOMEPRAZOLE (NEXIUM) 40 MG CAPSULE    Take 1 capsule (40 mg total) by mouth daily before breakfast. For reflux   FINASTERIDE (PROSCAR) 5 MG TABLET    TAKE 1 TABLET (5 MG TOTAL) BY MOUTH EVERY MORNING.   HYDROCHLOROTHIAZIDE (HYDRODIURIL) 25 MG TABLET    TAKE 1 TABLET (25 MG TOTAL) BY MOUTH EVERY MORNING.   HYDROCODONE-ACETAMINOPHEN (NORCO) 10-325 MG PER TABLET    Take 2 tablets by mouth every 6 (six) hours  as needed for moderate pain.   KETOROLAC (ACULAR) 0.4 % SOLN    Place 1 drop into the right eye 4 (four) times daily.   LATANOPROST (XALATAN) 0.005 % OPHTHALMIC SOLUTION    Place 1 drop into the right eye at bedtime. One drop once daily into the right eye.   METHOCARBAMOL (ROBAXIN) 500 MG TABLET    Take 500 mg by mouth every 6 (six) hours as needed for muscle spasms.   NITROGLYCERIN (NITROSTAT) 0.4 MG SL TABLET    Place 1 tablet (0.4 mg total) under the tongue every 5 (five) minutes as needed for chest pain.   OMEGA-3 ACID ETHYL ESTERS (LOVAZA) 1 G CAPSULE    Take 1 g by mouth 2 (two) times daily.   OXYCODONE-ACETAMINOPHEN (PERCOCET/ROXICET) 5-325 MG PER TABLET    Take 2 tablets by mouth every 4 (four) hours as needed for severe pain.   VITAMIN D, ERGOCALCIFEROL, (DRISDOL) 50000 UNITS CAPS CAPSULE    Take 50,000 Units by mouth every 7 (seven) days.  Modified Medications   Modified Medication Previous Medication   EZETIMIBE (ZETIA) 10 MG TABLET ZETIA 10 MG tablet      Take 1 tablet (10 mg total) by mouth every morning.    TAKE  1 TABLET BY MOUTH EVERY MORNING  Discontinued Medications   MICONAZOLE NITRATE 2 % SOLN    Apply 1 application topically 2 (two) times daily. Apply to affected nail   ZOSTER VACCINE LIVE, PF, (ZOSTAVAX) 76734 UNT/0.65ML INJECTION    Inject 19,400 Units into the skin once.    Review of Systems  Constitutional: Negative for activity change and appetite change.       Despite much encouragement, he has not been successful in weight loss.  HENT: Positive for tinnitus.   Eyes: Negative.   Respiratory: Negative for cough and chest tightness.   Cardiovascular: Negative for chest pain, palpitations and leg swelling.  Gastrointestinal: Positive for abdominal pain. Negative for nausea, diarrhea, constipation, blood in stool and abdominal distention.       History of mild dysphagia with food sticking in the chest periodically. Has had esophageal dilation by Dr. Cristina Gong. Abdominal  discomfort following meals  Endocrine: Negative.   Genitourinary: Negative.   Musculoskeletal: Positive for back pain, arthralgias and neck pain.       Low back discomfort. Hip pains. Neck pains.  Neurological: Negative.   Hematological: Negative.   Psychiatric/Behavioral: Negative.     Filed Vitals:   01/27/16 1246  BP: 130/84  Pulse: 70  Temp: 98.1 F (36.7 C)  TempSrc: Oral  Height: '5\' 10"'  (1.778 m)  Weight: 262 lb (118.842 kg)  SpO2: 96%   Body mass index is 37.59 kg/(m^2). Filed Weights   01/27/16 1246  Weight: 262 lb (118.842 kg)     Physical Exam  Constitutional: He is oriented to person, place, and time.  Obese male.  HENT:  Head: Normocephalic and atraumatic.  Left Ear: External ear normal.  Nose: Nose normal.  Prescription lenses  Eyes: EOM are normal. Pupils are equal, round, and reactive to light. Right conjunctiva is injected.  Neck: Normal range of motion. No JVD present. No tracheal deviation present. No thyromegaly present.  Moderate stiffness of the neck.  Cardiovascular: Normal rate, regular rhythm, normal heart sounds and intact distal pulses.  Exam reveals no gallop and no friction rub.   No murmur heard. Pulmonary/Chest: No respiratory distress. He has no wheezes. He has no rales. He exhibits no tenderness.  Abdominal: Soft. Bowel sounds are normal. He exhibits no distension and no mass. There is no tenderness.  Musculoskeletal: Normal range of motion. He exhibits no edema or tenderness.  Pain in lower back and hips with movement. Pain in the back with flexion, extension, and rotational movements of the head. Stiffness of bilateral knees and thighs  Lymphadenopathy:    He has no cervical adenopathy.  Neurological: He is alert and oriented to person, place, and time. No cranial nerve deficit. Coordination normal.  Skin: Skin is warm and dry. No rash noted. No erythema. No pallor.  Ganglion of the left index finger. Onychomycosis of the left great  toenail. Dermatofibroma medial right thigh. Slightly tender to touch.  Psychiatric: He has a normal mood and affect. His behavior is normal. Judgment and thought content normal.    Labs reviewed: Lab Summary Latest Ref Rng 01/25/2016 09/21/2015 02/06/2015  Hemoglobin 13.0-17.0 g/dL (None) (None) (None)  Hematocrit 39.0-52.0 % (None) (None) (None)  White count - (None) (None) (None)  Platelet count - (None) (None) (None)  Sodium 134 - 144 mmol/L 141 144 143  Potassium 3.5 - 5.2 mmol/L 4.2 3.9 3.9  Calcium 8.6 - 10.2 mg/dL 9.1 9.2 9.1  Phosphorus - (None) (None) (None)  Creatinine 0.76 - 1.27  mg/dL 0.94 0.97 0.93  AST 0 - 40 IU/L 14 18 (None)  Alk Phos 39 - 117 IU/L 70 68 (None)  Bilirubin 0.0 - 1.2 mg/dL 0.3 0.3 (None)  Glucose 65 - 99 mg/dL 101(H) 105(H) 96  Cholesterol - (None) (None) (None)  HDL cholesterol >39 mg/dL 46 37(L) 40  Triglycerides 0 - 149 mg/dL 122 137 185(H)  LDL Direct - (None) (None) (None)  LDL Calc 0 - 99 mg/dL 105(H) 116(H) 85  Total protein - (None) (None) (None)  Albumin 3.6 - 4.8 g/dL 4.2 4.2 (None)   No results found for: TSH, T3TOTAL, T4TOTAL, THYROIDAB Lab Results  Component Value Date   BUN 18 01/25/2016   BUN 15 09/21/2015   BUN 19 02/06/2015   Lab Results  Component Value Date   HGBA1C 5.6 09/21/2015   HGBA1C 5.9* 02/06/2015   HGBA1C 5.7* 07/28/2014    Assessment/Plan  1. Hyperglycemia - Hemoglobin A1c; Future - Comprehensive metabolic panel; Future  2. Hyperlipidemia History of statins causing elevated CK in the past - CK; Future - Lipid panel; Future  3. Dysphagia, pharyngoesophageal phase -Continue Nexium - famotidine (PEPCID) 20 MG tablet; One at bedtime to reduce stomach acid  Dispense: 30 tablet; Refill: 5  4. Esophageal stenosis  5. Essential hypertension Continue current medications  6. Obesity (BMI 30-39.9) Patient agreeablly established goal of 10 pound weight loss prior to next 6 month appointment.

## 2016-01-27 NOTE — Telephone Encounter (Signed)
Was able to look in the old computer system, we had not given him the Zostavax vaccine. I looked in Dr. Ammie Ferrier notes, and Dr. Ruffin Frederick notes. I saw we had given his tetanus, pneumon and flu vaccines. If he has any questions, he can call back.

## 2016-03-02 ENCOUNTER — Other Ambulatory Visit: Payer: Self-pay | Admitting: Internal Medicine

## 2016-03-02 LAB — COMPREHENSIVE METABOLIC PANEL
ALT: 17 IU/L (ref 0–44)
AST: 14 IU/L (ref 0–40)
Albumin/Globulin Ratio: 2.8 — ABNORMAL HIGH (ref 1.2–2.2)
Albumin: 4.2 g/dL (ref 3.6–4.8)
Alkaline Phosphatase: 70 IU/L (ref 39–117)
BILIRUBIN TOTAL: 0.3 mg/dL (ref 0.0–1.2)
BUN/Creatinine Ratio: 19 (ref 10–24)
BUN: 18 mg/dL (ref 8–27)
CHLORIDE: 104 mmol/L (ref 96–106)
CO2: 22 mmol/L (ref 18–29)
Calcium: 9.1 mg/dL (ref 8.6–10.2)
Creatinine, Ser: 0.94 mg/dL (ref 0.76–1.27)
GFR calc Af Amer: 97 mL/min/{1.73_m2} (ref >59)
GFR calc non Af Amer: 84 mL/min/{1.73_m2} (ref >59)
GLUCOSE: 101 mg/dL — AB (ref 65–99)
Globulin, Total: 1.5 g/dL (ref 1.5–4.5)
Potassium: 4.2 mmol/L (ref 3.5–5.2)
Sodium: 141 mmol/L (ref 134–144)
Total Protein: 5.7 g/dL — ABNORMAL LOW (ref 6.0–8.5)

## 2016-03-02 LAB — LIPID PANEL
Chol/HDL Ratio: 3.8 ratio units (ref 0.0–5.0)
Cholesterol, Total: 175 mg/dL (ref 100–199)
HDL: 46 mg/dL (ref >39)
LDL Calculated: 105 mg/dL — ABNORMAL HIGH (ref 0–99)
Triglycerides: 122 mg/dL (ref 0–149)
VLDL CHOLESTEROL CAL: 24 mg/dL (ref 5–40)

## 2016-04-21 ENCOUNTER — Ambulatory Visit (INDEPENDENT_AMBULATORY_CARE_PROVIDER_SITE_OTHER): Payer: PPO | Admitting: Podiatry

## 2016-04-21 ENCOUNTER — Ambulatory Visit (INDEPENDENT_AMBULATORY_CARE_PROVIDER_SITE_OTHER): Payer: PPO

## 2016-04-21 DIAGNOSIS — M79672 Pain in left foot: Secondary | ICD-10-CM

## 2016-04-21 DIAGNOSIS — M722 Plantar fascial fibromatosis: Secondary | ICD-10-CM | POA: Diagnosis not present

## 2016-04-21 DIAGNOSIS — L600 Ingrowing nail: Secondary | ICD-10-CM | POA: Diagnosis not present

## 2016-04-21 MED ORDER — TRIAMCINOLONE ACETONIDE 10 MG/ML IJ SUSP
10.0000 mg | Freq: Once | INTRAMUSCULAR | Status: AC
  Administered 2016-04-21: 10 mg

## 2016-04-21 NOTE — Patient Instructions (Signed)

## 2016-04-21 NOTE — Progress Notes (Signed)
Subjective:     Patient ID: Anthony [name], male   DOB: 31-May-1951, 65 y.o.   MRN: [id number]  HPI patient presents with pain in the bottom of his left heel that's increasingly hard for him to walk with comfortably. Also had an ingrown toenail left it's still concerning   Review of Systems     Objective:   Physical Exam Neurovascular status intact muscle strength adequate range of motion within normal limits with patient found to have exquisite discomfort plantar aspect left heel at the insertional point of the tendon into the calcaneus with fluid buildup and is also noted to have an incurvated hallux nail lateral border left that we worked on and still gets persistently tender in the distal tip    Assessment:     Plantar fasciitis left with thickening nail left hallux with distal ingrown lateral side    Plan:     H&P conditions reviewed and went ahead reviewed x-rays and then injected the plantar heel 3 mg Kenalog 5 mill grams Xylocaine and applied fascial brace and gave instructions on physical therapy. I then went ahead and discussed the nail trim the corner and may require total nail removal if symptoms persist  X-ray left indicated minimal plantar spur with no indications of stress fracture arthritis

## 2016-05-05 ENCOUNTER — Ambulatory Visit (INDEPENDENT_AMBULATORY_CARE_PROVIDER_SITE_OTHER): Payer: PPO | Admitting: Podiatry

## 2016-05-05 ENCOUNTER — Encounter: Payer: Self-pay | Admitting: Podiatry

## 2016-05-05 DIAGNOSIS — M722 Plantar fascial fibromatosis: Secondary | ICD-10-CM | POA: Diagnosis not present

## 2016-05-05 MED ORDER — TRIAMCINOLONE ACETONIDE 10 MG/ML IJ SUSP
10.0000 mg | Freq: Once | INTRAMUSCULAR | Status: AC
  Administered 2016-05-05: 10 mg

## 2016-05-05 NOTE — Progress Notes (Signed)
Subjective:     Patient ID: Anthony [name], male   DOB: 24-May-1951, 65 y.o.   MRN: [id number]  HPI patient states his left heel is hurting worse and that it seems to be more in the arch   Review of Systems     Objective:   Physical Exam Neurovascular status intact muscle strength was adequate with patient noted to have discomfort in the plantar fascia more in the mid arch area and slightly proximal. He is having worse pain when getting up in the morning after sitting    Assessment:     Plantar fasciitis left that's moved in a more distal direction    Plan:     Injected the left plantar fashion 3 mg Kenalog 5 mg Xylocaine and advised this patient on night splint usage which was dispensed today

## 2016-06-01 ENCOUNTER — Ambulatory Visit (INDEPENDENT_AMBULATORY_CARE_PROVIDER_SITE_OTHER): Payer: PPO | Admitting: Podiatry

## 2016-06-01 ENCOUNTER — Encounter: Payer: Self-pay | Admitting: Podiatry

## 2016-06-01 DIAGNOSIS — M722 Plantar fascial fibromatosis: Secondary | ICD-10-CM

## 2016-06-01 MED ORDER — TRIAMCINOLONE ACETONIDE 10 MG/ML IJ SUSP
10.0000 mg | Freq: Once | INTRAMUSCULAR | Status: AC
  Administered 2016-06-01: 10 mg

## 2016-06-01 NOTE — Progress Notes (Signed)
Subjective:     Patient ID: Anthony [name], male   DOB: 12-07-1950, 65 y.o.   MRN: [id number]  HPI patient states I'm doing better but I'm still having some discomfort if I do to much.   Review of Systems     Objective:   Physical Exam Neurovascular status intact muscle strength adequate patient still having discomfort in the plantar fascial left but improved    Assessment:     Plantar fasciitis still present but improved    Plan:     Final injection administered 3 mg Kenalog 5 mg Xylocaine and applied fascial brace and instructed on continues supportive shoe gear usage

## 2016-06-17 ENCOUNTER — Telehealth: Payer: Self-pay | Admitting: Internal Medicine

## 2016-06-17 NOTE — Telephone Encounter (Signed)
LM to see if pt can come in 11:15am on 12/13 (before 12pm appt w/Dr. Nyoka Cowden) for AWV w/NHA; j.strack

## 2016-06-28 DIAGNOSIS — H40053 Ocular hypertension, bilateral: Secondary | ICD-10-CM | POA: Diagnosis not present

## 2016-06-28 DIAGNOSIS — H40051 Ocular hypertension, right eye: Secondary | ICD-10-CM | POA: Diagnosis not present

## 2016-06-28 DIAGNOSIS — H33051 Total retinal detachment, right eye: Secondary | ICD-10-CM | POA: Diagnosis not present

## 2016-07-25 DIAGNOSIS — Z96652 Presence of left artificial knee joint: Secondary | ICD-10-CM | POA: Diagnosis not present

## 2016-07-25 DIAGNOSIS — M5432 Sciatica, left side: Secondary | ICD-10-CM | POA: Diagnosis not present

## 2016-07-26 ENCOUNTER — Encounter: Payer: Self-pay | Admitting: Internal Medicine

## 2016-07-29 ENCOUNTER — Other Ambulatory Visit: Payer: Self-pay | Admitting: Internal Medicine

## 2016-07-29 DIAGNOSIS — R739 Hyperglycemia, unspecified: Secondary | ICD-10-CM

## 2016-07-29 DIAGNOSIS — E7849 Other hyperlipidemia: Secondary | ICD-10-CM

## 2016-08-01 ENCOUNTER — Other Ambulatory Visit: Payer: PPO

## 2016-08-01 DIAGNOSIS — E784 Other hyperlipidemia: Secondary | ICD-10-CM | POA: Diagnosis not present

## 2016-08-01 DIAGNOSIS — E7849 Other hyperlipidemia: Secondary | ICD-10-CM

## 2016-08-01 DIAGNOSIS — R739 Hyperglycemia, unspecified: Secondary | ICD-10-CM | POA: Diagnosis not present

## 2016-08-01 LAB — HEMOGLOBIN A1C
HEMOGLOBIN A1C: 5.3 % (ref <5.7)
Mean Plasma Glucose: 105 mg/dL

## 2016-08-01 LAB — COMPLETE METABOLIC PANEL WITH GFR
ALT: 26 U/L (ref 9–46)
AST: 17 U/L (ref 10–35)
Albumin: 3.9 g/dL (ref 3.6–5.1)
Alkaline Phosphatase: 60 U/L (ref 40–115)
BUN: 16 mg/dL (ref 7–25)
CALCIUM: 9.1 mg/dL (ref 8.6–10.3)
CO2: 30 mmol/L (ref 20–31)
CREATININE: 0.83 mg/dL (ref 0.70–1.25)
Chloride: 106 mmol/L (ref 98–110)
GFR, Est African American: >89 mL/min (ref >=60)
GFR, Est Non African American: >89 mL/min (ref >=60)
Glucose, Bld: 92 mg/dL (ref 65–99)
POTASSIUM: 4.2 mmol/L (ref 3.5–5.3)
Sodium: 142 mmol/L (ref 135–146)
Total Bilirubin: 0.4 mg/dL (ref 0.2–1.2)
Total Protein: 5.5 g/dL — ABNORMAL LOW (ref 6.1–8.1)

## 2016-08-01 LAB — LIPID PANEL
CHOLESTEROL: 151 mg/dL (ref <200)
HDL: 45 mg/dL (ref >40)
LDL Cholesterol: 82 mg/dL (ref <100)
TRIGLYCERIDES: 118 mg/dL (ref <150)
Total CHOL/HDL Ratio: 3.4 Ratio (ref <5.0)
VLDL: 24 mg/dL (ref <30)

## 2016-08-02 LAB — CK: Total CK: 129 U/L (ref 7–232)

## 2016-08-03 ENCOUNTER — Ambulatory Visit (INDEPENDENT_AMBULATORY_CARE_PROVIDER_SITE_OTHER): Payer: PPO | Admitting: Internal Medicine

## 2016-08-03 ENCOUNTER — Encounter: Payer: Self-pay | Admitting: Internal Medicine

## 2016-08-03 VITALS — BP 132/82 | HR 72 | Temp 98.0°F | Ht 70.0 in | Wt 268.0 lb

## 2016-08-03 DIAGNOSIS — R1314 Dysphagia, pharyngoesophageal phase: Secondary | ICD-10-CM | POA: Diagnosis not present

## 2016-08-03 DIAGNOSIS — E7849 Other hyperlipidemia: Secondary | ICD-10-CM

## 2016-08-03 DIAGNOSIS — F329 Major depressive disorder, single episode, unspecified: Secondary | ICD-10-CM

## 2016-08-03 DIAGNOSIS — M722 Plantar fascial fibromatosis: Secondary | ICD-10-CM | POA: Insufficient documentation

## 2016-08-03 DIAGNOSIS — Z23 Encounter for immunization: Secondary | ICD-10-CM

## 2016-08-03 DIAGNOSIS — M1990 Unspecified osteoarthritis, unspecified site: Secondary | ICD-10-CM

## 2016-08-03 DIAGNOSIS — E784 Other hyperlipidemia: Secondary | ICD-10-CM | POA: Diagnosis not present

## 2016-08-03 DIAGNOSIS — E669 Obesity, unspecified: Secondary | ICD-10-CM | POA: Diagnosis not present

## 2016-08-03 DIAGNOSIS — R739 Hyperglycemia, unspecified: Secondary | ICD-10-CM | POA: Diagnosis not present

## 2016-08-03 DIAGNOSIS — I10 Essential (primary) hypertension: Secondary | ICD-10-CM | POA: Diagnosis not present

## 2016-08-03 DIAGNOSIS — K222 Esophageal obstruction: Secondary | ICD-10-CM | POA: Diagnosis not present

## 2016-08-03 MED ORDER — DULOXETINE HCL 30 MG PO CPEP: ORAL_CAPSULE | ORAL | 3 refills | Status: DC

## 2016-08-03 NOTE — Progress Notes (Signed)
Facility  [facility]    Place of Service:   OFFICE    Allergies  Allergen Reactions  . Codeine Other (See Comments)    Headache  . Lipitor [Atorvastatin] Other (See Comments)    Leg pain/cramps  . Red Yeast Rice [Cholestin] Other (See Comments)    Makes legs hurt  . Statins Other (See Comments)    "muscle break down"  . Latex Rash    Chief Complaint  Patient presents with  . Medical Management of Chronic Issues    6 month medication management blood pressure, hyperglycemia, dysphagia, review labs.    HPI:  Essential hypertension =- controlled  Encounter for immunization - Plan: Flu Vaccine QUAD 36+ mos IM  Hyperglycemia - controlled  Esophageal stenosis - still some sensations of food hanging in the esoph.  Obesity (BMI 30-39.9) - gaining weight  Other hyperlipidemia - controlled  Plantar fasciitis -- has seen Dr. Paulla Dolly for right heel pain. Received injection  Osteoarthritis, unspecified osteoarthritis type, unspecified site - recently put on steroids for knee pain that did seem to help, but appetite increased.  Dysphagia, pharyngoesophageal phase - some sensations of food hanging in the esoph.  Reactive depression - tense, son and wife and 2 grandchildren moved into the house. He is feeling stressed. A lot of the care of the grandchildren seems to have shifted to him. Not sleeping well.  Vaccine for VZV (varicella-zoster virus)    Medications: Patient's Medications  New Prescriptions   No medications on file  Previous Medications   ALPHAGAN P 0.1 % SOLN    PLACE 1 DROP INTO THE RIGHT EYE 2 (TWO) TIMES DAILY.   AMLODIPINE (NORVASC) 5 MG TABLET    Take 1.5 tablets (7.5 mg total) by mouth every morning.   ASPIRIN EC 81 MG TABLET    Take 81 mg by mouth every morning.   BENAZEPRIL (LOTENSIN) 40 MG TABLET    TAKE 1 TABLET (40 MG TOTAL) BY MOUTH EVERY MORNING.   DORZOLAMIDE-TIMOLOL (COSOPT) 22.3-6.8 MG/ML OPHTHALMIC SOLUTION    Place 1 drop into the right eye 2  (two) times daily.   ESOMEPRAZOLE (NEXIUM) 40 MG CAPSULE    Take 1 capsule (40 mg total) by mouth daily before breakfast. For reflux   EZETIMIBE (ZETIA) 10 MG TABLET    Take 1 tablet (10 mg total) by mouth every morning.   FAMOTIDINE (PEPCID) 20 MG TABLET    One at bedtime to reduce stomach acid   FINASTERIDE (PROSCAR) 5 MG TABLET    TAKE 1 TABLET (5 MG TOTAL) BY MOUTH EVERY MORNING.   HYDROCHLOROTHIAZIDE (HYDRODIURIL) 25 MG TABLET    TAKE 1 TABLET (25 MG TOTAL) BY MOUTH EVERY MORNING.   HYDROCODONE-ACETAMINOPHEN (NORCO) 10-325 MG PER TABLET    Take 2 tablets by mouth every 6 (six) hours as needed for moderate pain.   KETOROLAC (ACULAR) 0.4 % SOLN    Place 1 drop into the right eye 4 (four) times daily.   LATANOPROST (XALATAN) 0.005 % OPHTHALMIC SOLUTION    Place 1 drop into the right eye at bedtime. One drop once daily into the right eye.   METHOCARBAMOL (ROBAXIN) 500 MG TABLET    Take 500 mg by mouth every 6 (six) hours as needed for muscle spasms.   NITROGLYCERIN (NITROSTAT) 0.4 MG SL TABLET    Place 1 tablet (0.4 mg total) under the tongue every 5 (five) minutes as needed for chest pain.   OMEGA-3 ACID ETHYL ESTERS (LOVAZA) 1 G  CAPSULE    Take 1 g by mouth 2 (two) times daily.   OXYCODONE-ACETAMINOPHEN (PERCOCET/ROXICET) 5-325 MG PER TABLET    Take 2 tablets by mouth every 4 (four) hours as needed for severe pain.   VITAMIN D, ERGOCALCIFEROL, (DRISDOL) 50000 UNITS CAPS CAPSULE    Take 50,000 Units by mouth every 7 (seven) days.  Modified Medications   No medications on file  Discontinued Medications   No medications on file    Review of Systems  Constitutional: Negative for activity change and appetite change.       Despite much encouragement, he has not been successful in weight loss.  HENT: Positive for tinnitus.   Eyes: Negative.   Respiratory: Negative for cough and chest tightness.   Cardiovascular: Negative for chest pain, palpitations and leg swelling.  Gastrointestinal:  Positive for abdominal pain. Negative for abdominal distention, blood in stool, constipation, diarrhea and nausea.       History of mild dysphagia with food sticking in the chest periodically. Has had esophageal dilation by Dr. Cristina Gong. Abdominal discomfort following meals  Endocrine: Negative.   Genitourinary: Negative.   Musculoskeletal: Positive for arthralgias, back pain and neck pain.       Low back discomfort. Hip pains. Neck pains.  Neurological: Negative.   Hematological: Negative.   Psychiatric/Behavioral: Negative.     Vitals:   08/03/16 1209  BP: 132/82  Pulse: 72  Temp: 98 F (36.7 C)  TempSrc: Oral  SpO2: 95%  Weight: 268 lb (121.6 kg)  Height: 5' 10" (1.778 m)   Body mass index is 38.45 kg/m. Wt Readings from Last 3 Encounters:  08/03/16 268 lb (121.6 kg)  01/27/16 262 lb (118.8 kg)  09/23/15 263 lb 12.8 oz (119.7 kg)      Physical Exam  Constitutional: He is oriented to person, place, and time.  Obese male.  HENT:  Head: Normocephalic and atraumatic.  Left Ear: External ear normal.  Nose: Nose normal.  Prescription lenses  Eyes: EOM are normal. Pupils are equal, round, and reactive to light. Right conjunctiva is injected.  Neck: Normal range of motion. No JVD present. No tracheal deviation present. No thyromegaly present.  Moderate stiffness of the neck.  Cardiovascular: Normal rate, regular rhythm, normal heart sounds and intact distal pulses.  Exam reveals no gallop and no friction rub.   No murmur heard. Pulmonary/Chest: No respiratory distress. He has no wheezes. He has no rales. He exhibits no tenderness.  Abdominal: Soft. Bowel sounds are normal. He exhibits no distension and no mass. There is no tenderness.  Musculoskeletal: Normal range of motion. He exhibits no edema or tenderness.  Pain in lower back and hips with movement. Pain in the back with flexion, extension, and rotational movements of the head. Stiffness of bilateral knees and thighs    Lymphadenopathy:    He has no cervical adenopathy.  Neurological: He is alert and oriented to person, place, and time. No cranial nerve deficit. Coordination normal.  Skin: Skin is warm and dry. No rash noted. No erythema. No pallor.  Ganglion of the left index finger. Onychomycosis of the left great toenail. Dermatofibroma medial right thigh. Slightly tender to touch.  Psychiatric: He has a normal mood and affect. His behavior is normal. Judgment and thought content normal.    Labs reviewed: Lab Summary Latest Ref Rng & Units 08/01/2016 01/25/2016 09/21/2015  Hemoglobin 13.0-17.0 g/dL (None) (None) (None)  Hematocrit 39.0-52.0 % (None) (None) (None)  White count - (None) (None) (None)  Platelet count - (None) (None) (None)  Sodium 135 - 146 mmol/L 142 141 144  Potassium 3.5 - 5.3 mmol/L 4.2 4.2 3.9  Calcium 8.6 - 10.3 mg/dL 9.1 9.1 9.2  Phosphorus - (None) (None) (None)  Creatinine 0.70 - 1.25 mg/dL 0.83 0.94 0.97  AST 10 - 35 U/L _0 Alk Phos 40 - 115 U/L 60 70 68  Bilirubin 0.2 - 1.2 mg/dL 0.4 0.3 0.3  Glucose 65 - 99 mg/dL 92 101(H) 105(H)  Cholesterol <200 mg/dL 151 (None) (None)  HDL cholesterol >40 mg/dL 45 46 37(L)  Triglycerides <150 mg/dL 118 122 137  LDL Direct - (None) (None) (None)  LDL Calc <100 mg/dL 82 105(H) 116(H)  Total protein 6.1 - 8.1 g/dL 5.5(L) (None) (None)  Albumin 3.6 - 5.1 g/dL 3.9 4.2 4.2  Some recent data might be hidden   No results found for: TSH, T3TOTAL, T4TOTAL, THYROIDAB Lab Results  Component Value Date   BUN 16 08/01/2016   BUN 18 01/25/2016   BUN 15 09/21/2015   Lab Results  Component Value Date   HGBA1C 5.3 08/01/2016   HGBA1C 5.6 09/21/2015   HGBA1C 5.9 (H) 02/06/2015    Assessment/Plan  1. Encounter for immunization - Flu Vaccine QUAD 36+ mos IM  2. Essential hypertension - Comprehensive metabolic panel; Future  3. Hyperglycemia - Comprehensive metabolic panel; Future  4. Esophageal stenosis Refer to Dr.  Cristina Gong when he desires  5. Obesity (BMI 30-39.9) Follow diet  6. Other hyperlipidemia - Lipid panel; Future  7. Plantar fasciitis continue with Dr. Paulla Dolly  8. Osteoarthritis, unspecified osteoarthritis type, unspecified site -try Cymbalta qd and Aleve twice daily  9. Dysphagia, pharyngoesophageal phase Dr. Cristina Gong when he desires  10. Reactive depression -Cymbalta 30 mg qd  11. Vaccine for VZV (varicella-zoster virus) Wrote Rx.

## 2016-08-05 ENCOUNTER — Ambulatory Visit (INDEPENDENT_AMBULATORY_CARE_PROVIDER_SITE_OTHER): Payer: PPO

## 2016-08-05 VITALS — BP 134/68 | HR 74 | Temp 97.9°F | Ht 70.0 in | Wt 268.8 lb

## 2016-08-05 DIAGNOSIS — Z0000 Encounter for general adult medical examination without abnormal findings: Secondary | ICD-10-CM

## 2016-08-05 NOTE — Patient Instructions (Addendum)
Mr. Coe Anthony [name] , Thank you for taking time to come for your Medicare Wellness Visit. I appreciate your ongoing commitment to your health goals. Please review the following plan we discussed and let me know if I can assist you in the future.   These are the goals we discussed: Goals    . Exercise 3x per week (30 min per time)          Starting 08/05/16, I will attempt to exercise 3 x per week.        This is a list of the screening recommended for you and due dates:  Health Maintenance  Topic Date Due  .  Hepatitis C: One time screening is recommended by Center for Disease Control  (CDC) for  adults born from [birth year] through 1967.   July 02, 1951  . Shingles Vaccine  01/08/2009  . Pneumonia vaccines (2 of 2 - PPSV23) 09/24/2015  . Tetanus Vaccine  08/29/2021  . Colon Cancer Screening  05/08/2022  . Flu Shot  Completed  Preventive Care for Adults  A healthy lifestyle and preventive care can promote health and wellness. Preventive health guidelines for adults include the following key practices.  . A routine yearly physical is a good way to check with your health care provider about your health and preventive screening. It is a chance to share any concerns and updates on your health and to receive a thorough exam.  . Visit your dentist for a routine exam and preventive care every 6 months. Brush your teeth twice a day and floss once a day. Good oral hygiene prevents tooth decay and gum disease.  . The frequency of eye exams is based on your age, health, family medical history, use  of contact lenses, and other factors. Follow your health care provider's ecommendations for frequency of eye exams.  . Eat a healthy diet. Foods like vegetables, fruits, whole grains, low-fat dairy products, and lean protein foods contain the nutrients you need without too many calories. Decrease your intake of foods high in solid fats, added sugars, and salt. Eat the right amount of calories for you. Get information  about a proper diet from your health care provider, if necessary.  . Regular physical exercise is one of the most important things you can do for your health. Most adults should get at least 150 minutes of moderate-intensity exercise (any activity that increases your heart rate and causes you to sweat) each week. In addition, most adults need muscle-strengthening exercises on 2 or more days a week.  Silver Sneakers may be a benefit available to you. To determine eligibility, you may visit the website: www.silversneakers.com or contact program at 3066838115 Mon-Fri between 8AM-8PM.   . Maintain a healthy weight. The body mass index (BMI) is a screening tool to identify possible weight problems. It provides an estimate of body fat based on height and weight. Your health care provider can find your BMI and can help you achieve or maintain a healthy weight.   For adults 20 years and older: ? A BMI below 18.5 is considered underweight. ? A BMI of 18.5 to 24.9 is normal. ? A BMI of 25 to 29.9 is considered overweight. ? A BMI of 30 and above is considered obese.   . Maintain normal blood lipids and cholesterol levels by exercising and minimizing your intake of saturated fat. Eat a balanced diet with plenty of fruit and vegetables. Blood tests for lipids and cholesterol should begin at age 48 and be repeated  every 5 years. If your lipid or cholesterol levels are high, you are over 50, or you are at high risk for heart disease, you may need your cholesterol levels checked more frequently. Ongoing high lipid and cholesterol levels should be treated with medicines if diet and exercise are not working.  . If you smoke, find out from your health care provider how to quit. If you do not use tobacco, please do not start.  . If you choose to drink alcohol, please do not consume more than 2 drinks per day. One drink is considered to be 12 ounces (355 mL) of beer, 5 ounces (148 mL) of wine, or 1.5 ounces (44  mL) of liquor.  . If you are 67-67 years old, ask your health care provider if you should take aspirin to prevent strokes.  . Use sunscreen. Apply sunscreen liberally and repeatedly throughout the day. You should seek shade when your shadow is shorter than you. Protect yourself by wearing long sleeves, pants, a wide-brimmed hat, and sunglasses year round, whenever you are outdoors.  . Once a month, do a whole body skin exam, using a mirror to look at the skin on your back. Tell your health care provider of new moles, moles that have irregular borders, moles that are larger than a pencil eraser, or moles that have changed in shape or color.

## 2016-08-05 NOTE — Progress Notes (Signed)
Subjective:   Anthony [name] is a 65 y.o. male who presents for an Initial Medicare Annual Wellness Visit.  Review of Systems   Cardiac Risk Factors include: advanced age (>56men, >79 women);family history of premature cardiovascular disease;hypertension;male gender;obesity (BMI >30kg/m2)    Objective:    Today's Vitals   08/05/16 1014  BP: 134/68  Pulse: 74  Temp: 97.9 F (36.6 C)  TempSrc: Oral  SpO2: 97%  Weight: 268 lb 12.8 oz (121.9 kg)  Height: 5\' 10"  (1.778 m)  PainSc: 0-No pain   Body mass index is 38.57 kg/m.  Current Medications (verified) Outpatient Encounter Prescriptions as of 08/05/2016  Medication Sig  . amLODipine (NORVASC) 5 MG tablet Take 1.5 tablets (7.5 mg total) by mouth every morning.  Marland Kitchen aspirin EC 81 MG tablet Take 81 mg by mouth every morning.  . benazepril (LOTENSIN) 40 MG tablet TAKE 1 TABLET (40 MG TOTAL) BY MOUTH EVERY MORNING.  . brimonidine (ALPHAGAN) 0.2 % ophthalmic solution Place 1 drop into the right eye 2 (two) times daily.  . dorzolamide-timolol (COSOPT) 22.3-6.8 MG/ML ophthalmic solution Place 1 drop into the right eye 2 (two) times daily.  . DULoxetine (CYMBALTA) 30 MG capsule One daly to help pains from fibromyalgia  . esomeprazole (NEXIUM) 40 MG capsule Take 1 capsule (40 mg total) by mouth daily before breakfast. For reflux (Patient taking differently: Take 40 mg by mouth at bedtime. For reflux)  . ezetimibe (ZETIA) 10 MG tablet Take 1 tablet (10 mg total) by mouth every morning.  . famotidine (PEPCID) 20 MG tablet One at bedtime to reduce stomach acid  . finasteride (PROSCAR) 5 MG tablet TAKE 1 TABLET (5 MG TOTAL) BY MOUTH EVERY MORNING.  . hydrochlorothiazide (HYDRODIURIL) 25 MG tablet TAKE 1 TABLET (25 MG TOTAL) BY MOUTH EVERY MORNING.  Marland Kitchen HYDROcodone-acetaminophen (NORCO) 10-325 MG per tablet Take 2 tablets by mouth every 6 (six) hours as needed for moderate pain.  . methocarbamol (ROBAXIN) 500 MG tablet Take 500 mg by mouth  every 6 (six) hours as needed for muscle spasms.  . nitroGLYCERIN (NITROSTAT) 0.4 MG SL tablet Place 1 tablet (0.4 mg total) under the tongue every 5 (five) minutes as needed for chest pain.  . Vitamin D, Ergocalciferol, (DRISDOL) 50000 UNITS CAPS capsule Take 50,000 Units by mouth every 7 (seven) days.  . [DISCONTINUED] ALPHAGAN P 0.1 % SOLN PLACE 1 DROP INTO THE RIGHT EYE 2 (TWO) TIMES DAILY.  . [DISCONTINUED] ketorolac (ACULAR) 0.4 % SOLN Place 1 drop into the right eye 4 (four) times daily.  . [DISCONTINUED] latanoprost (XALATAN) 0.005 % ophthalmic solution Place 1 drop into the right eye at bedtime. One drop once daily into the right eye.  . [DISCONTINUED] omega-3 acid ethyl esters (LOVAZA) 1 G capsule Take 1 g by mouth 2 (two) times daily.  . [DISCONTINUED] oxyCODONE-acetaminophen (PERCOCET/ROXICET) 5-325 MG per tablet Take 2 tablets by mouth every 4 (four) hours as needed for severe pain.   No facility-administered encounter medications on file as of 08/05/2016.     Allergies (verified) Codeine; Lipitor [atorvastatin]; Red yeast rice [cholestin]; Statins; and Latex   History: Past Medical History:  Diagnosis Date  . Benign neoplasm of colon   . Benign neoplasm of skin of upper limb, including shoulder   . Carpal tunnel syndrome   . Diaphragmatic hernia without mention of obstruction or gangrene   . Dyslipidemia   . Elevated prostate specific antigen (PSA)   . GERD (gastroesophageal reflux disease)   .  Herpes zoster without mention of complication   . Hypertension   . Hypertension   . Hypertrophy of prostate with urinary obstruction and other lower urinary tract symptoms (LUTS)   . Impotence of organic origin   . Lumbago   . Obesity, unspecified   . Osteoarthrosis, unspecified whether generalized or localized, lower leg   . Other and unspecified hyperlipidemia   . Other malaise and fatigue   . Other seborrheic keratosis   . Special screening for malignant neoplasm of  prostate   . Unspecified tinnitus    Past Surgical History:  Procedure Laterality Date  . CARDIOVASCULAR STRESS TEST  06/15/2012   Non-diagnostic for ischemia. No lexiscan EKG changes.  . CHOLECYSTECTOMY  1992  . EYE SURGERY    . eyeband  2006   removal of right eyeband  . INGUINAL HERNIA REPAIR  1984   left  . INTRAOCULAR LENS REMOVAL  2008   right  . LEFT HEART CATHETERIZATION WITH CORONARY ANGIOGRAM N/A 09/05/2013   Procedure: LEFT HEART CATHETERIZATION WITH CORONARY ANGIOGRAM;  Surgeon: Troy Sine, MD;  Location[facility] C[facility];  Service: Cardiovascular;  Laterality: N/A;  . LOWER VENOUS EXTREMITY DOPPLER  01/26/2011   No evidence of thrombus or thrombophlebitis.  Marland Kitchen RETINAL DETACHMENT SURGERY  2001   right  . TOTAL HIP ARTHROPLASTY  2012   bilateral  . TOTAL KNEE ARTHROPLASTY  2011, 2012   bilateral  . TRANSTHORACIC ECHOCARDIOGRAM  10/05/2009   EF >55%, normal LV size, systolic, and diastolic function.   Family History  Problem Relation Age of Onset  . Diabetes Mother   . Arrhythmia Mother   . Hypertension Mother   . Dementia Father   . Pulmonary embolism Father   . Hypertension Father   . Cancer Sister     Liver cancer - Histocytic lymphoma  . Cancer Sister 79    Skin cancer  . Hypertension Sister 18  . Diabetes Sister   . Stroke Brother   . Diabetes Brother   . Hypertension Brother   . Cancer Daughter   . Cancer Maternal Grandmother     Skin cancer  . Heart attack Maternal Grandfather   . Cancer Paternal Grandfather   . Hypertension Paternal Grandfather    Social History   Occupational History  . retired Medical sales representative     Social History Main Topics  . Smoking status: Never Smoker  . Smokeless tobacco: Never Used  . Alcohol use No  . Drug use: No  . Sexual activity: Yes    Partners: Female     Comment: wife   Tobacco Counseling Counseling given: No   Activities of Daily Living In your present state of health, do you have any difficulty  performing the following activities: 08/05/2016  Hearing? Y  Vision? Y  Difficulty concentrating or making decisions? N  Walking or climbing stairs? Y  Dressing or bathing? N  Doing errands, shopping? N  Preparing Food and eating ? N  Using the Toilet? N  In the past six months, have you accidently leaked urine? N  Do you have problems with loss of bowel control? N  Managing your Medications? Y  Managing your Finances? N  Housekeeping or managing your Housekeeping? N  Some recent data might be hidden    Immunizations and Health Maintenance Immunization History  Administered Date(s) Administered  . Influenza,inj,Quad PF,36+ Mos 06/11/2013, 05/20/2015, 08/03/2016  . Pneumococcal Conjugate-13 09/23/2014  . Pneumococcal Polysaccharide-23 06/27/2003  . Tdap 08/30/2011   Health  Maintenance Due  Topic Date Due  . Hepatitis C Screening  1950-10-14  . ZOSTAVAX  01/08/2009  . PNA vac Low Risk Adult (2 of 2 - PPSV23) 09/24/2015    Patient Care Team: Estill Dooms, MD as PCP - General (Internal Medicine)  Indicate any recent Medical Services you may have received from other than [facility] providers in the past year (date may be approximate).    Assessment:   This is a routine wellness examination for [place].   Hearing/Vision screen  Hearing Screening   125Hz  250Hz  500Hz  1000Hz  2000Hz  3000Hz  4000Hz  6000Hz  8000Hz   Right ear:   100 100 0  100    Left ear:   100 0 100  0    Comments: Last hearing screen done 2 yrs ago. Has some hearing loss. Failed hearing screen in office.  Vision Screening Comments: Pt has eyes exams done [facility]; hx of cataracts.   Dietary issues and exercise activities discussed: Current Exercise Habits: The patient does not participate in regular exercise at present, Exercise limited by: orthopedic condition(s)  Goals    . Exercise 3x per week (30 min per time)          Starting 08/05/16, I will attempt to exercise 3 x per week.       Depression  Screen PHQ 2/9 Scores 08/05/2016 01/27/2016 01/27/2016 05/20/2015  PHQ - 2 Score 0 0 0 0    Fall Risk Fall Risk  08/05/2016 01/27/2016 01/27/2016 09/23/2015 05/20/2015  Falls in the past year? No No No No No  Number falls in past yr: - - - - -  Injury with Fall? - - - - -    Cognitive Function: MMSE - Mini Mental State Exam 08/05/2016  Orientation to time 4  Orientation to Place 5  Registration 3  Attention/ Calculation 3  Recall 3  Language- name 2 objects 2  Language- repeat 1  Language- follow 3 step command 3  Language- read & follow direction 1  Write a sentence 1  Copy design 1  Total score 27        Screening Tests Health Maintenance  Topic Date Due  . Hepatitis C Screening  1951/08/01  . ZOSTAVAX  01/08/2009  . PNA vac Low Risk Adult (2 of 2 - PPSV23) 09/24/2015  . TETANUS/TDAP  08/29/2021  . COLONOSCOPY  05/08/2022  . INFLUENZA VACCINE  Completed        Plan:    I have personally reviewed and addressed the Medicare Annual Wellness questionnaire and have noted the following in the patient's chart:  A. Medical and social history B. Use of alcohol, tobacco or illicit drugs  C. Current medications and supplements D. Functional ability and status E.  Nutritional status F.  Physical activity G. Advance directives H. List of other physicians I.  Hospitalizations, surgeries, and ER visits in previous 12 months J.  [place] to include hearing, vision, cognitive, depression L. Referrals and appointments - none  In addition, I have reviewed and discussed with patient certain preventive protocols, quality metrics, and best practice recommendations. A written personalized care plan for preventive services as well as general preventive health recommendations were provided to patient.  See attached scanned questionnaire for additional information.   Signed,   Allyn Kenner, [place]. Perlie Gold  [facility] [address] [place], [place] [address] [phone] Cell (Monday-Friday 8  AM - 5 PM) [id number] After 5 PM and follow prompts

## 2016-08-05 NOTE — Progress Notes (Signed)
Quick Notes   Health Maintenance:   Up to Date   Abnormal Screen:  None; MMSE-27/30, passed clock test   Patient Concerns:   None   Nurse Concerns:   none

## 2016-08-16 ENCOUNTER — Telehealth: Payer: Self-pay | Admitting: Cardiovascular Disease

## 2016-08-16 NOTE — Telephone Encounter (Signed)
Patient of Dr. Claiborne Billings last seen 09/2013. He reports experiencing chest discomfort, variable in intensity, x 3 weeks. Notes at times he's had right arm tingling and/or shortness of breath. Notes pain 1-2/10 more or less all the time, occ as much as 8/10.  Denies other symptoms. He notes he has not taken nitro for this due to headache. He has not gone to ED for evaluation.  Scheduled patient for eval tomorrow in office. He's aware if more severe CP returns to go to ED. Pt notes understanding of instructions as well as appt information.

## 2016-08-16 NOTE — Telephone Encounter (Signed)
Pt have been having chest discomfort,tingling in his right arm and some shortness of breath.Pt needs to be seen.I felt pt need to talk to nurse before I gave him an appointment.

## 2016-08-17 ENCOUNTER — Ambulatory Visit (INDEPENDENT_AMBULATORY_CARE_PROVIDER_SITE_OTHER): Payer: PPO | Admitting: Student

## 2016-08-17 ENCOUNTER — Encounter: Payer: Self-pay | Admitting: Student

## 2016-08-17 VITALS — BP 164/86 | HR 76 | Ht 71.0 in | Wt 286.6 lb

## 2016-08-17 DIAGNOSIS — I10 Essential (primary) hypertension: Secondary | ICD-10-CM

## 2016-08-17 DIAGNOSIS — E784 Other hyperlipidemia: Secondary | ICD-10-CM

## 2016-08-17 DIAGNOSIS — R0789 Other chest pain: Secondary | ICD-10-CM

## 2016-08-17 DIAGNOSIS — R0609 Other forms of dyspnea: Secondary | ICD-10-CM | POA: Diagnosis not present

## 2016-08-17 DIAGNOSIS — R5383 Other fatigue: Secondary | ICD-10-CM

## 2016-08-17 DIAGNOSIS — E7849 Other hyperlipidemia: Secondary | ICD-10-CM

## 2016-08-17 NOTE — Progress Notes (Signed)
Cardiology Office Note    Date:  08/17/2016   ID:  Yoscar, Alberts [birth year], MRN [id number]  PCP:  Jeanmarie Hubert, MD  Cardiologist: Dr. Claiborne Billings   Chief Complaint  Patient presents with  . Follow-up  . Chest Pain  . Shortness of Breath    some     History of Present Illness:    Anthony [name] is a 65 y.o. male with past medical history of HTN, HLD, and normal cors by cath in 2015 who presents to the office today for evaluation of chest pain.   Was last seen by Dr. Claiborne Billings in 09/2013 following his recent cardiac catheterization. He was continued on Amlodipine 7.5 mg daily, Benazepril 40 mg daily, and HCTZ 25mg  daily. Has been unsbale to tolerate statin therapy in the past, therefore he is on Zetia and Lovaza. Recent Lipid Panel from 08/01/2016 showed an improved total cholesterol of 151 and LDL at 82.  In talking with the patient today, he reports developing chest discomfort approximately 3 days ago. His left pectoral wall is tender to palpation and he feels this pain is worsened if he turns from side to side. No association with exertion. Also notes a radiating numbness and tingling down his right arm. This is not always associated with the pain, and has been present for several weeks.   He has experienced worsening dyspnea with exertion for the past 1-2 months. Says he notices this when walking along his driveway or in the grocery store. Denies any orthopnea, PND, or lower extremity edema. Weights have been stable on his home scales.   He was recently started on Cymbalta by his PCP for fibromyalgia. Since starting this last week, he has developed worsening fatigue and daytime somnolence. He self discontinued this 2 days ago and reports significant improvement in the symptoms since.   Past Medical History:  Diagnosis Date  . Benign neoplasm of colon   . Benign neoplasm of skin of upper limb, including shoulder   . Carpal tunnel syndrome   . Chest pain    a. normal cors by  cath in 2015  . Diaphragmatic hernia without mention of obstruction or gangrene   . Dyslipidemia   . Elevated prostate specific antigen (PSA)   . GERD (gastroesophageal reflux disease)   . Herpes zoster without mention of complication   . Hypertension   . Hypertension   . Hypertrophy of prostate with urinary obstruction and other lower urinary tract symptoms (LUTS)   . Impotence of organic origin   . Lumbago   . Obesity, unspecified   . Osteoarthrosis, unspecified whether generalized or localized, lower leg   . Other and unspecified hyperlipidemia   . Other malaise and fatigue   . Other seborrheic keratosis   . Special screening for malignant neoplasm of prostate   . Unspecified tinnitus     Past Surgical History:  Procedure Laterality Date  . CARDIOVASCULAR STRESS TEST  06/15/2012   Non-diagnostic for ischemia. No lexiscan EKG changes.  . CHOLECYSTECTOMY  1992  . EYE SURGERY    . eyeband  2006   removal of right eyeband  . INGUINAL HERNIA REPAIR  1984   left  . INTRAOCULAR LENS REMOVAL  2008   right  . LEFT HEART CATHETERIZATION WITH CORONARY ANGIOGRAM N/A 09/05/2013   Procedure: LEFT HEART CATHETERIZATION WITH CORONARY ANGIOGRAM;  Surgeon: Troy Sine, MD;  Location: [facility] CATH LAB;  Service: Cardiovascular;  Laterality: N/A;  . LOWER VENOUS EXTREMITY  DOPPLER  01/26/2011   No evidence of thrombus or thrombophlebitis.  Marland Kitchen RETINAL DETACHMENT SURGERY  2001   right  . TOTAL HIP ARTHROPLASTY  2012   bilateral  . TOTAL KNEE ARTHROPLASTY  2011, 2012   bilateral  . TRANSTHORACIC ECHOCARDIOGRAM  10/05/2009   EF >55%, normal LV size, systolic, and diastolic function.    Current Medications: Outpatient Medications Prior to Visit  Medication Sig Dispense Refill  . amLODipine (NORVASC) 5 MG tablet Take 1.5 tablets (7.5 mg total) by mouth every morning. 135 tablet 3  . aspirin EC 81 MG tablet Take 81 mg by mouth every morning.    . benazepril (LOTENSIN) 40 MG tablet TAKE 1 TABLET  (40 MG TOTAL) BY MOUTH EVERY MORNING. 90 tablet 3  . brimonidine (ALPHAGAN) 0.2 % ophthalmic solution Place 1 drop into the right eye 2 (two) times daily.    . dorzolamide-timolol (COSOPT) 22.3-6.8 MG/ML ophthalmic solution Place 1 drop into the right eye 2 (two) times daily.    . DULoxetine (CYMBALTA) 30 MG capsule One daly to help pains from fibromyalgia 30 capsule 3  . esomeprazole (NEXIUM) 40 MG capsule Take 1 capsule (40 mg total) by mouth daily before breakfast. For reflux (Patient taking differently: Take 40 mg by mouth at bedtime. For reflux) 90 capsule 3  . ezetimibe (ZETIA) 10 MG tablet Take 1 tablet (10 mg total) by mouth every morning. 90 tablet 3  . famotidine (PEPCID) 20 MG tablet One at bedtime to reduce stomach acid 30 tablet 5  . finasteride (PROSCAR) 5 MG tablet TAKE 1 TABLET (5 MG TOTAL) BY MOUTH EVERY MORNING. 90 tablet 3  . hydrochlorothiazide (HYDRODIURIL) 25 MG tablet TAKE 1 TABLET (25 MG TOTAL) BY MOUTH EVERY MORNING. 90 tablet 3  . HYDROcodone-acetaminophen (NORCO) 10-325 MG per tablet Take 2 tablets by mouth every 6 (six) hours as needed for moderate pain.    . methocarbamol (ROBAXIN) 500 MG tablet Take 500 mg by mouth every 6 (six) hours as needed for muscle spasms.    . nitroGLYCERIN (NITROSTAT) 0.4 MG SL tablet Place 1 tablet (0.4 mg total) under the tongue every 5 (five) minutes as needed for chest pain. 25 tablet 2  . Vitamin D, Ergocalciferol, (DRISDOL) 50000 UNITS CAPS capsule Take 50,000 Units by mouth every 7 (seven) days.     No facility-administered medications prior to visit.      Allergies:   Codeine; Lipitor [atorvastatin]; Red yeast rice [cholestin]; Statins; and Latex   Social History   Social History  . Marital status: Married    Spouse name: N/A  . Number of children: N/A  . Years of education: N/A   Occupational History  . retired Medical sales representative     Social History Main Topics  . Smoking status: Never Smoker  . Smokeless tobacco: Never Used    . Alcohol use No  . Drug use: No  . Sexual activity: Yes    Partners: Female     Comment: wife   Other Topics Concern  . None   Social History Narrative   Married   Never smoked   Alcohol none   Exercise -gym 3 days a week (join 3 months ago)   Living Will     Family History:  The patient's family history includes Arrhythmia in his mother; Cancer in his daughter, maternal grandmother, paternal grandfather, and sister; Cancer (age of onset: 19) in his sister; Dementia in his father; Diabetes in his brother, mother, and sister; Heart attack  in his maternal grandfather; Hypertension in his brother, father, mother, and paternal grandfather; Hypertension (age of onset: 53) in his sister; Pulmonary embolism in his father; Stroke in his brother.   Review of Systems:   Please see the history of present illness.     General:  No chills, fever, night sweats or weight changes. Positive for generalized fatigue.  Cardiovascular:  No edema, orthopnea, palpitations, paroxysmal nocturnal dyspnea. Positive for chest discomfort and dyspnea on exertion.  Dermatological: No rash, lesions/masses Respiratory: No cough, dyspnea Urologic: No hematuria, dysuria Abdominal:   No nausea, vomiting, diarrhea, bright red blood per rectum, melena, or hematemesis Neurologic:  No visual changes, wkns, changes in mental status. All other systems reviewed and are otherwise negative except as noted above.   Physical Exam:    VS:  BP (!) 164/86   Pulse 76   Ht 5\' 11"  (1.803 m)   Wt 286 lb 9.6 oz (130 kg)   BMI 39.97 kg/m    General: Well developed, overweight Caucasian male appearing in no acute distress. Head: Normocephalic, atraumatic, sclera non-icteric, no xanthomas, nares are without discharge.  Neck: No carotid bruits. JVD not elevated.  Lungs: Respirations regular and unlabored, without wheezes or rales.  Heart: Regular rate and rhythm. No S3 or S4.  No murmur, no rubs, or gallops  appreciated. Abdomen: Soft, non-tender, non-distended with normoactive bowel sounds. No hepatomegaly. No rebound/guarding. No obvious abdominal masses. Msk:  Strength and tone appear normal for age. No joint deformities or effusions. Extremities: No clubbing or cyanosis. No edema.  Distal pedal pulses are 2+ bilaterally. Neuro: Alert and oriented X 3. Moves all extremities spontaneously. No focal deficits noted. Psych:  Responds to questions appropriately with a normal affect. Skin: No rashes or lesions noted  Wt Readings from Last 3 Encounters:  08/17/16 286 lb 9.6 oz (130 kg)  08/05/16 268 lb 12.8 oz (121.9 kg)  08/03/16 268 lb (121.6 kg)     Studies/Labs Reviewed:   EKG:  EKG is ordered today. The ekg ordered today demonstrates NSR, HR 76, with no acute ST or T-wave changes when compared to prior tracings.   Recent Labs: 08/01/2016: ALT 26; BUN 16; Creat 0.83; Potassium 4.2; Sodium 142   Lipid Panel    Component Value Date/Time   CHOL 151 08/01/2016 0822   CHOL 175 01/25/2016 0825   CHOL 191 07/22/2013 0835   TRIG 118 08/01/2016 0822   TRIG 253 (H) 07/22/2013 0835   HDL 45 08/01/2016 0822   HDL 46 01/25/2016 0825   HDL 38 (L) 07/22/2013 0835   CHOLHDL 3.4 08/01/2016 0822   VLDL 24 08/01/2016 0822   LDLCALC 82 08/01/2016 0822   LDLCALC 105 (H) 01/25/2016 0825   LDLCALC 102 (H) 07/22/2013 0835    Additional studies/ records that were reviewed today include:   Cardiac Catheterization: 08/2013 ANGIOGRAPHY:  Left main: Angiographically normal vessel which trifurcated into an LAD, a ramus intermediate vessel, and left circumflex coronary artery. LAD: Angiographically normal vessel which gave rise to 2 proximal diagonal vessels and septal perforating artery and extended to the LV apex. Ramus Intermediate: Small angiographically normal vessel Left circumflex: Angiographically normal vessel which gave rise to a left atrial circumflex branch and the major marginal vessel  which extended to the LV apex.  Right coronary artery: Angiographically normal dominant vessel.   Left ventriculography revealed normal LV function without focal segmental wall motion abnormalities. Ejection fraction 55%. No mitral regurgitation.  IMPRESSION:  Normal LV function  Normal  coronary arteries   Echocardiogram: 08/2013 Study Conclusions  - Left ventricle: The cavity size was normal. Wall thickness was increased in a pattern of mild LVH. Systolic function was normal. The estimated ejection fraction was in the range of 55% to 60%. Wall motion was normal; there were no regional wall motion abnormalities. Doppler parameters are consistent with abnormal left ventricular relaxation (grade 1 diastolic dysfunction). - Aortic valve: There was no stenosis. - Mitral valve: Mildly calcified annulus. Mildly thickened leaflets . Trivial regurgitation. - Right ventricle: The cavity size was normal. Systolic function was normal. - Tricuspid valve: Peak RV-RA gradient: 68mm Hg (S). - Pulmonary arteries: PA peak pressure: 48mm Hg (S). - Inferior vena cava: The vessel was normal in size; the respirophasic diameter changes were in the normal range (= 50%); findings are consistent with normal central venous pressure. Impressions:  - Normal LV size with mild LVH, EF 55-60%. Normal RV size and systolic function. No significant valvular abnormalities.  Assessment:    1. Atypical chest pain   2. DOE (dyspnea on exertion)   3. Essential hypertension   4. Other hyperlipidemia   5. Fatigue, unspecified type      Plan:   In order of problems listed above:  1. Atypical Chest Pain - the patient reports developing chest discomfort approximately 3 days ago, with his pain being worse on palpation and turning from side to side. Pain not exacerbated by lying flat. No association with exertion. - He is diffusely tender to palpation along his left pectoral  region on examination.  - EKG today is without acute ischemic changes. With his normal EKG and cath just two years ago showing normal coronary arteries, would not pursue further ischemic evaluation at this time. Overall, his symptoms seem most consistent with a MSK etiology.   2. Dyspnea on Exertion - patient reports experiencing worsening dyspnea with exertion for the past 1-2 months. Denies any orthopnea, PND, or lower extremity edema.  - will obtain echocardiogram to assess LV function and wall motion.   3. Essential HTN - BP elevated at 164/86 initially today, at 144/76 on recheck.  - he reports SBP readings consistently in the 140's - 150's on his home monitor in the past week.  - currently on Amlodipine 7.5 mg daily, Benazepril 40 mg daily, and HCTZ 25mg  daily. Informed the patient if his SBP readings remain > 140, would increase Amlodipine to 10mg  daily. Informed to watch for lower extremity edema with dose increase.   4. HLD - recent Lipid Panel from 08/01/2016 showed an improved total cholesterol of 151 and LDL at 82. - continue Zetia as he is intolerant to statin therapy. Followed by PCP.  5. Fatigue/ Fibromyalgia - Since starting Cymbalta this last week, he has developed worsening fatigue and daytime somnolence. With self-discontinuation of this two days ago, he reports significant improvement in the symptoms.  - will route this note to his PCP to make him aware of this. With him only being on the medication for one week, he should not require a taper.    Medication Adjustments/Labs and Tests Ordered: Current medicines are reviewed at length with the patient today.  Concerns regarding medicines are outlined above.  Medication changes, Labs and Tests ordered today are listed in the Patient Instructions below. Patient Instructions  Your physician has requested that you have an echocardiogram @ [address] - [address]rd [address]. Echocardiography is a painless test that uses sound  waves to create images of your heart. It  provides your doctor with information about the size and shape of your heart and how well your heart's chambers and valves are working. This procedure takes approximately one hour. There are no restrictions for this procedure.  If systolic BP (top number) stays greater than 140, increase amlodipine to 10mg  daily  Your physician wants you to follow-up in: ONE YEAR with Dr. Claiborne Billings. You will receive a reminder letter in the mail two months in advance. If you don't receive a letter, please call our office to schedule the follow-up appointment.     Arna Medici, [place]  08/17/2016 3:26 PM    [facility] [place], [place], [place]  [address] Phone: [phone]; Fax: [phone]  [address] [place], [place] [address] Phone: [phone]

## 2016-08-17 NOTE — Patient Instructions (Addendum)
Your physician has requested that you have an echocardiogram @ [address] - [address]. Echocardiography is a painless test that uses sound waves to create images of your heart. It provides your doctor with information about the size and shape of your heart and how well your heart's chambers and valves are working. This procedure takes approximately one hour. There are no restrictions for this procedure.  If systolic BP (top number) stays greater than 140, increase amlodipine to 10mg  daily  Your physician wants you to follow-up in: ONE YEAR with Dr. Claiborne Billings. You will receive a reminder letter in the mail two months in advance. If you don't receive a letter, please call our office to schedule the follow-up appointment.

## 2016-08-23 ENCOUNTER — Other Ambulatory Visit: Payer: Self-pay

## 2016-08-23 ENCOUNTER — Ambulatory Visit (HOSPITAL_COMMUNITY): Payer: PPO | Attending: Cardiovascular Disease

## 2016-08-23 DIAGNOSIS — R0609 Other forms of dyspnea: Secondary | ICD-10-CM | POA: Diagnosis not present

## 2016-08-23 DIAGNOSIS — R0789 Other chest pain: Secondary | ICD-10-CM | POA: Diagnosis not present

## 2016-08-23 DIAGNOSIS — I517 Cardiomegaly: Secondary | ICD-10-CM | POA: Diagnosis not present

## 2016-09-15 ENCOUNTER — Other Ambulatory Visit: Payer: Self-pay | Admitting: Internal Medicine

## 2016-09-19 ENCOUNTER — Other Ambulatory Visit: Payer: Self-pay

## 2016-09-19 ENCOUNTER — Encounter: Payer: Self-pay | Admitting: Internal Medicine

## 2016-09-19 DIAGNOSIS — I10 Essential (primary) hypertension: Secondary | ICD-10-CM

## 2016-09-19 MED ORDER — BENAZEPRIL HCL 40 MG PO TABS: ORAL_TABLET | ORAL | 1 refills | Status: DC

## 2016-09-19 MED ORDER — FINASTERIDE 5 MG PO TABS: ORAL_TABLET | ORAL | 1 refills | Status: DC

## 2016-09-19 MED ORDER — ESOMEPRAZOLE MAGNESIUM 40 MG PO CPDR: 40.0000 mg | DELAYED_RELEASE_CAPSULE | Freq: Every day | ORAL | 1 refills | Status: DC

## 2016-09-19 MED ORDER — AMLODIPINE BESYLATE 5 MG PO TABS: 7.5000 mg | ORAL_TABLET | Freq: Every morning | ORAL | 1 refills | Status: DC

## 2016-09-19 NOTE — Telephone Encounter (Signed)
Patient requested a prescription to be sent to [facility] but an electronic prescription cannot be sent to an out of country pharmacy. Prescription was printed for patient to pick up. Patient was notified of this via mychart. Prescription was printed and placed in Dr. Cyndi Lennert folder for signing. Once signed, Rx will be placed at front desk for pick up.

## 2016-09-21 ENCOUNTER — Other Ambulatory Visit: Payer: Self-pay | Admitting: Internal Medicine

## 2016-09-21 DIAGNOSIS — I10 Essential (primary) hypertension: Secondary | ICD-10-CM

## 2016-09-27 ENCOUNTER — Other Ambulatory Visit: Payer: Self-pay | Admitting: *Deleted

## 2016-10-27 ENCOUNTER — Other Ambulatory Visit: Payer: PPO

## 2016-10-27 DIAGNOSIS — Z23 Encounter for immunization: Secondary | ICD-10-CM

## 2016-10-27 DIAGNOSIS — I10 Essential (primary) hypertension: Secondary | ICD-10-CM | POA: Diagnosis not present

## 2016-10-27 DIAGNOSIS — E784 Other hyperlipidemia: Secondary | ICD-10-CM | POA: Diagnosis not present

## 2016-10-27 DIAGNOSIS — E7849 Other hyperlipidemia: Secondary | ICD-10-CM

## 2016-10-27 DIAGNOSIS — R739 Hyperglycemia, unspecified: Secondary | ICD-10-CM | POA: Diagnosis not present

## 2016-10-27 LAB — COMPREHENSIVE METABOLIC PANEL
ALK PHOS: 71 U/L (ref 40–115)
ALT: 24 U/L (ref 9–46)
AST: 17 U/L (ref 10–35)
Albumin: 4.1 g/dL (ref 3.6–5.1)
BUN: 19 mg/dL (ref 7–25)
CO2: 25 mmol/L (ref 20–31)
Calcium: 9.1 mg/dL (ref 8.6–10.3)
Chloride: 106 mmol/L (ref 98–110)
Creat: 1.08 mg/dL (ref 0.70–1.25)
GLUCOSE: 96 mg/dL (ref 65–99)
POTASSIUM: 4 mmol/L (ref 3.5–5.3)
Sodium: 142 mmol/L (ref 135–146)
Total Bilirubin: 0.6 mg/dL (ref 0.2–1.2)
Total Protein: 5.9 g/dL — ABNORMAL LOW (ref 6.1–8.1)

## 2016-10-27 LAB — LIPID PANEL
CHOL/HDL RATIO: 4.8 ratio (ref <5.0)
Cholesterol: 187 mg/dL (ref <200)
HDL: 39 mg/dL — ABNORMAL LOW (ref >40)
LDL Cholesterol: 113 mg/dL — ABNORMAL HIGH (ref <100)
Triglycerides: 173 mg/dL — ABNORMAL HIGH (ref <150)
VLDL: 35 mg/dL — ABNORMAL HIGH (ref <30)

## 2016-10-28 ENCOUNTER — Other Ambulatory Visit: Payer: PPO

## 2016-11-01 ENCOUNTER — Ambulatory Visit (INDEPENDENT_AMBULATORY_CARE_PROVIDER_SITE_OTHER): Payer: PPO | Admitting: Internal Medicine

## 2016-11-01 ENCOUNTER — Encounter: Payer: Self-pay | Admitting: Internal Medicine

## 2016-11-01 VITALS — BP 164/80 | HR 72 | Temp 97.8°F | Resp 18 | Ht 72.0 in | Wt 275.0 lb

## 2016-11-01 DIAGNOSIS — R739 Hyperglycemia, unspecified: Secondary | ICD-10-CM

## 2016-11-01 DIAGNOSIS — I10 Essential (primary) hypertension: Secondary | ICD-10-CM

## 2016-11-01 DIAGNOSIS — E784 Other hyperlipidemia: Secondary | ICD-10-CM

## 2016-11-01 DIAGNOSIS — E669 Obesity, unspecified: Secondary | ICD-10-CM | POA: Diagnosis not present

## 2016-11-01 DIAGNOSIS — E7849 Other hyperlipidemia: Secondary | ICD-10-CM

## 2016-11-01 NOTE — Progress Notes (Signed)
Facility  [facility]    Place of Service:   OFFICE    Allergies  Allergen Reactions  . Codeine Other (See Comments)    Headache  . Lipitor [Atorvastatin] Other (See Comments)    Leg pain/cramps  . Red Yeast Rice [Cholestin] Other (See Comments)    Makes legs hurt  . Statins Other (See Comments)    "muscle break down"  . Latex Rash    Chief Complaint  Patient presents with  . Medical Management of Chronic Issues    3 month follow-up. Lab review.  . Medication Refill    No refills needed at this time     HPI:  Essential hypertension =- controlled  Hyperglycemia - controlled  Esophageal stenosis - still some sensations of food hanging in the esoph.  Obesity (BMI 30-39.9) - lost 9# since Dec 2017.  Other hyperlipidemia - rising  LDL Plantar fasciitis -- has seen Dr. Paulla Dolly for right heel pain. Received injection  Osteoarthritis, unspecified osteoarthritis type, unspecified site - recently put on steroids for knee pain that did seem to help, but appetite increased.  Dysphagia, pharyngoesophageal phase - some sensations of food hanging in the esoph.  Reactive depression - tense, son and wife and 2 grandchildren moved into the house. He is feeling stressed. A lot of the care of the grandchildren seems to have shifted to him. Not sleeping well. Not currently on any medication for this problem. Stopped Cymbalta because it made him feel more nervous.  Medications: Patient's Medications  New Prescriptions   No medications on file  Previous Medications   AMLODIPINE (NORVASC) 5 MG TABLET    Take 1.5 tablets (7.5 mg total) by mouth every morning.   ASPIRIN EC 81 MG TABLET    Take 81 mg by mouth every morning.   BENAZEPRIL (LOTENSIN) 40 MG TABLET    TAKE 1 TABLET (40 MG TOTAL) BY MOUTH EVERY MORNING.   BRIMONIDINE (ALPHAGAN) 0.2 % OPHTHALMIC SOLUTION    Place 1 drop into the right eye 2 (two) times daily.   DORZOLAMIDE-TIMOLOL (COSOPT) 22.3-6.8 MG/ML OPHTHALMIC SOLUTION     Place 1 drop into the right eye 2 (two) times daily.   DULOXETINE (CYMBALTA) 30 MG CAPSULE    One daly to help pains from fibromyalgia   ESOMEPRAZOLE (NEXIUM) 40 MG CAPSULE    Take 1 capsule (40 mg total) by mouth at bedtime. For reflux   EZETIMIBE (ZETIA) 10 MG TABLET    Take 1 tablet (10 mg total) by mouth every morning.   FAMOTIDINE (PEPCID) 20 MG TABLET    One at bedtime to reduce stomach acid   FINASTERIDE (PROSCAR) 5 MG TABLET    TAKE 1 TABLET (5 MG TOTAL) BY MOUTH EVERY MORNING.   HYDROCHLOROTHIAZIDE (HYDRODIURIL) 25 MG TABLET    TAKE 1 TABLET (25 MG TOTAL) BY MOUTH EVERY MORNING.   HYDROCODONE-ACETAMINOPHEN (NORCO) 10-325 MG PER TABLET    Take 2 tablets by mouth every 6 (six) hours as needed for moderate pain.   METHOCARBAMOL (ROBAXIN) 500 MG TABLET    Take 500 mg by mouth every 6 (six) hours as needed for muscle spasms.   NITROGLYCERIN (NITROSTAT) 0.4 MG SL TABLET    Place 1 tablet (0.4 mg total) under the tongue every 5 (five) minutes as needed for chest pain.   VITAMIN D, ERGOCALCIFEROL, (DRISDOL) 50000 UNITS CAPS CAPSULE    Take 50,000 Units by mouth every 7 (seven) days.  Modified Medications   No medications on file  Discontinued Medications   No medications on file    Review of Systems  Constitutional: Negative for activity change and appetite change.       Despite much encouragement, he has not been successful in weight loss.  HENT: Positive for tinnitus.   Eyes: Negative.   Respiratory: Negative for cough and chest tightness.   Cardiovascular: Negative for chest pain, palpitations and leg swelling.  Gastrointestinal: Positive for abdominal pain. Negative for abdominal distention, blood in stool, constipation, diarrhea and nausea.       History of mild dysphagia with food sticking in the chest periodically. Has had esophageal dilation by Dr. Cristina Gong. Abdominal discomfort following meals  Endocrine: Negative.   Genitourinary: Negative.   Musculoskeletal: Positive for  arthralgias, back pain and neck pain.       Low back discomfort. Hip pains. Neck pains.  Neurological: Negative.   Hematological: Negative.   Psychiatric/Behavioral: Negative.     Vitals:   11/01/16 1353  BP: (!) 164/80  Pulse: 72  Resp: 18  Temp: 97.8 F (36.6 C)  TempSrc: Oral  SpO2: 96%  Weight: 275 lb (124.7 kg)  Height: 6' (1.829 m)   Body mass index is 37.3 kg/m. Wt Readings from Last 3 Encounters:  11/01/16 275 lb (124.7 kg)  08/17/16 286 lb 9.6 oz (130 kg)  08/05/16 268 lb 12.8 oz (121.9 kg)      Physical Exam  Constitutional: He is oriented to person, place, and time.  Obese male.  HENT:  Head: Normocephalic and atraumatic.  Left Ear: External ear normal.  Nose: Nose normal.  Prescription lenses  Eyes: EOM are normal. Pupils are equal, round, and reactive to light. Right conjunctiva is injected.  Neck: Normal range of motion. No JVD present. No tracheal deviation present. No thyromegaly present.  Moderate stiffness of the neck.  Cardiovascular: Normal rate, regular rhythm, normal heart sounds and intact distal pulses.  Exam reveals no gallop and no friction rub.   No murmur heard. Pulmonary/Chest: No respiratory distress. He has no wheezes. He has no rales. He exhibits no tenderness.  Abdominal: Soft. Bowel sounds are normal. He exhibits no distension and no mass. There is no tenderness.  Musculoskeletal: Normal range of motion. He exhibits no edema or tenderness.  Pain in lower back and hips with movement. Pain in the back with flexion, extension, and rotational movements of the head. Stiffness of bilateral knees and thighs  Lymphadenopathy:    He has no cervical adenopathy.  Neurological: He is alert and oriented to person, place, and time. No cranial nerve deficit. Coordination normal.  Skin: Skin is warm and dry. No rash noted. No erythema. No pallor.  Ganglion of the left index finger. Onychomycosis of the left great toenail. Dermatofibroma medial  right thigh. Slightly tender to touch.  Psychiatric: He has a normal mood and affect. His behavior is normal. Judgment and thought content normal.    Labs reviewed: Lab Summary Latest Ref Rng & Units 10/27/2016 08/01/2016 01/25/2016  Hemoglobin 13.0-17.0 g/dL (None) (None) (None)  Hematocrit 39.0-52.0 % (None) (None) (None)  White count - (None) (None) (None)  Platelet count - (None) (None) (None)  Sodium 135 - 146 mmol/L 142 142 141  Potassium 3.5 - 5.3 mmol/L 4.0 4.2 4.2  Calcium 8.6 - 10.3 mg/dL 9.1 9.1 9.1  Phosphorus - (None) (None) (None)  Creatinine 0.70 - 1.25 mg/dL 1.08 0.83 0.94  AST 10 - 35 U/L _0 Alk Phos 40 - 115 U/L 71 60  70  Bilirubin 0.2 - 1.2 mg/dL 0.6 0.4 0.3  Glucose 65 - 99 mg/dL 96 92 101(H)  Cholesterol <200 mg/dL 187 151 (None)  HDL cholesterol >40 mg/dL 39(L) 45 46  Triglycerides <150 mg/dL 173(H) 118 122  LDL Direct - (None) (None) (None)  LDL Calc <100 mg/dL 113(H) 82 105(H)  Total protein 6.1 - 8.1 g/dL 5.9(L) 5.5(L) (None)  Albumin 3.6 - 5.1 g/dL 4.1 3.9 4.2  Some recent data might be hidden   No results found for: TSH, T3TOTAL, T4TOTAL, THYROIDAB Lab Results  Component Value Date   BUN 19 10/27/2016   BUN 16 08/01/2016   BUN 18 01/25/2016   Lab Results  Component Value Date   HGBA1C 5.3 08/01/2016   HGBA1C 5.6 09/21/2015   HGBA1C 5.9 (H) 02/06/2015    Assessment/Plan  1. Essential hypertension The current medical regimen is adequate; needs to focus on continued weight loss. Continue present plan and medications. - Comprehensive metabolic panel; Future  2. Other hyperlipidemia The current medical regimen is effective;  continue present plan and medications. - Lipid panel; Future  3. Hyperglycemia The current medical regimen is effective;  continue present plan and medications. - Comprehensive metabolic panel; Future - Hemoglobin A1c; Future - Microalbumin, urine; Future  4. Obesity (BMI 30-39.9) -reviewed 1000 calorie diet

## 2016-12-27 DIAGNOSIS — H40053 Ocular hypertension, bilateral: Secondary | ICD-10-CM | POA: Diagnosis not present

## 2016-12-27 DIAGNOSIS — H40051 Ocular hypertension, right eye: Secondary | ICD-10-CM | POA: Diagnosis not present

## 2017-01-03 DIAGNOSIS — J387 Other diseases of larynx: Secondary | ICD-10-CM | POA: Diagnosis not present

## 2017-01-03 DIAGNOSIS — K219 Gastro-esophageal reflux disease without esophagitis: Secondary | ICD-10-CM | POA: Diagnosis not present

## 2017-01-03 DIAGNOSIS — R1310 Dysphagia, unspecified: Secondary | ICD-10-CM | POA: Diagnosis not present

## 2017-02-09 ENCOUNTER — Encounter: Payer: Self-pay | Admitting: Internal Medicine

## 2017-02-09 ENCOUNTER — Other Ambulatory Visit: Payer: Self-pay | Admitting: Internal Medicine

## 2017-02-09 MED ORDER — BENZONATATE 200 MG PO CAPS: 200.0000 mg | ORAL_CAPSULE | Freq: Two times a day (BID) | ORAL | 1 refills | Status: DC | PRN

## 2017-02-09 NOTE — Progress Notes (Signed)
Pt requested tessalon perles be renewed. Ollin Hochmuth L. Jerrad Mendibles, D.O. [facility] [address], [place] [address] Cell Phone (Mon-Fri 8am-5pm):  [phone] On Call:  [phone] & follow prompts after 5pm & weekends Office Phone:  [phone] Office Fax:  [phone]

## 2017-02-15 NOTE — Addendum Note (Signed)
Addended by: Royann Shivers A on: 02/15/2017 03:30 PM   Modules accepted: Orders

## 2017-02-27 ENCOUNTER — Encounter: Payer: Self-pay | Admitting: Internal Medicine

## 2017-02-27 MED ORDER — EZETIMIBE 10 MG PO TABS: 10.0000 mg | ORAL_TABLET | Freq: Every morning | ORAL | 3 refills | Status: DC

## 2017-03-09 ENCOUNTER — Ambulatory Visit (INDEPENDENT_AMBULATORY_CARE_PROVIDER_SITE_OTHER): Payer: PPO | Admitting: Podiatry

## 2017-03-09 ENCOUNTER — Encounter: Payer: Self-pay | Admitting: Podiatry

## 2017-03-09 DIAGNOSIS — M79675 Pain in left toe(s): Secondary | ICD-10-CM | POA: Diagnosis not present

## 2017-03-09 DIAGNOSIS — B351 Tinea unguium: Secondary | ICD-10-CM | POA: Diagnosis not present

## 2017-03-09 NOTE — Progress Notes (Signed)
This patient presents the office with chief complaint of a thick disfigured toenail on his left big toe.  He says he had surgery previously and the nail has regrown unattached's following the surgery.  He says the nail is painful walking and wearing his shoes.  No evidence of any drainage noted.  He presents the office today for an evaluation and treatment of this painful nail plate left foot   GENERAL APPEARANCE: Alert, conversant. Appropriately groomed. No acute distress.  VASCULAR: Pedal pulses are  palpable at  [facility] and PT bilateral.  Capillary refill time is immediate to all digits,  Normal temperature gradient.   NEUROLOGIC: sensation is normal to 5.07 monofilament at 5/5 sites bilateral.  Light touch is intact bilateral, Muscle strength normal.  MUSCULOSKELETAL: acceptable muscle strength, tone and stability bilateral.  Intrinsic muscluature intact bilateral.  Rectus appearance of foot and digits noted bilateral.  NAIL  thick disfigured and unattached nail plate on the left hallux.  No evidence of any drainage, redness or infection.   DERMATOLOGIC: skin color, texture, and turgor are within normal limits.  No preulcerative lesions or ulcers  are seen, no interdigital maceration noted.  No open lesions present.  . No drainage noted.  Diagnosis  Onychomycosis  Left hallux.  ROV.  Nail surgery.  Treatment options and alternatives discussed.  Recommended permanent phenol matrixectomy and patient agreed.  Left hallux  was prepped with alcohol and a toe block of 3cc of 2% lidocaine plain was administered in a digital toe block. .  The toe was then prepped with betadine solution .  The offending nail  was then excised and matrix tissue exposed.  Phenol was then applied to the matrix tissue followed by an alcohol wash.  Antibiotic ointment and a dry sterile dressing was applied.  The patient was dispensed instructions for aftercare. RTC 1 week.     Gardiner Barefoot DPM

## 2017-03-12 ENCOUNTER — Other Ambulatory Visit: Payer: Self-pay | Admitting: Internal Medicine

## 2017-03-12 DIAGNOSIS — I10 Essential (primary) hypertension: Secondary | ICD-10-CM

## 2017-03-16 ENCOUNTER — Encounter: Payer: Self-pay | Admitting: Podiatry

## 2017-03-16 ENCOUNTER — Ambulatory Visit (INDEPENDENT_AMBULATORY_CARE_PROVIDER_SITE_OTHER): Payer: Self-pay | Admitting: Podiatry

## 2017-03-16 DIAGNOSIS — Z09 Encounter for follow-up examination after completed treatment for conditions other than malignant neoplasm: Secondary | ICD-10-CM

## 2017-03-16 NOTE — Progress Notes (Signed)
This patient returns to the office following nail surgery one week ago.  The patient says toe has been soaked and bandaged as directed.  There has been improvement of the toe since the surgery has been performed. The patient presents for continued evaluation and treatment.  GENERAL APPEARANCE: Alert, conversant. Appropriately groomed. No acute distress.  VASCULAR: Pedal pulses palpable at  DP and PT bilateral.  Capillary refill time is immediate to all digits,  Normal temperature gradient.    NEUROLOGIC: sensation is normal to 5.07 monofilament at 5/5 sites bilateral.  Light touch is intact bilateral, Muscle strength normal.  MUSCULOSKELETAL: acceptable muscle strength, tone and stability bilateral.  Intrinsic muscluature intact bilateral.  Rectus appearance of foot and digits noted bilateral.   DERMATOLOGIC: skin color, texture, and turgor are within normal limits.  No preulcerative lesions or ulcers  are seen, no interdigital maceration noted.   NAILS  There is necrotic tissue along the nail groove  In the absence of redness swelling and pain.  DX  S/p nail surgery  ROV  Home instructions were discussed.  Patient to call the office if there are any questions or concerns.   Trejuan Matherne DPM   

## 2017-03-24 ENCOUNTER — Encounter: Payer: Self-pay | Admitting: Internal Medicine

## 2017-03-24 MED ORDER — TRIAMCINOLONE ACETONIDE 0.1 % EX CREA: 1.0000 "application " | TOPICAL_CREAM | Freq: Two times a day (BID) | CUTANEOUS | 0 refills | Status: DC

## 2017-03-24 NOTE — Telephone Encounter (Signed)
rx sent to pharmacy by e-script Sent patient message back thru my-chart, that prescription has been sent in.

## 2017-03-31 NOTE — Addendum Note (Signed)
Addended by: Royann Shivers A on: 03/31/2017 03:29 PM   Modules accepted: Orders

## 2017-05-01 ENCOUNTER — Other Ambulatory Visit: Payer: PPO

## 2017-05-01 DIAGNOSIS — I10 Essential (primary) hypertension: Secondary | ICD-10-CM | POA: Diagnosis not present

## 2017-05-01 DIAGNOSIS — E784 Other hyperlipidemia: Secondary | ICD-10-CM | POA: Diagnosis not present

## 2017-05-01 DIAGNOSIS — R739 Hyperglycemia, unspecified: Secondary | ICD-10-CM | POA: Diagnosis not present

## 2017-05-01 DIAGNOSIS — E669 Obesity, unspecified: Secondary | ICD-10-CM

## 2017-05-01 DIAGNOSIS — E7849 Other hyperlipidemia: Secondary | ICD-10-CM

## 2017-05-02 LAB — MICROALBUMIN, URINE: Microalb, Ur: 0.9 mg/dL

## 2017-05-02 LAB — COMPREHENSIVE METABOLIC PANEL
AG Ratio: 2.3 (calc) (ref 1.0–2.5)
ALT: 23 U/L (ref 9–46)
AST: 17 U/L (ref 10–35)
Albumin: 3.9 g/dL (ref 3.6–5.1)
Alkaline phosphatase (APISO): 66 U/L (ref 40–115)
BUN: 12 mg/dL (ref 7–25)
CO2: 29 mmol/L (ref 20–32)
Calcium: 9.3 mg/dL (ref 8.6–10.3)
Chloride: 106 mmol/L (ref 98–110)
Creat: 0.93 mg/dL (ref 0.70–1.25)
Globulin: 1.7 g/dL (calc) — ABNORMAL LOW (ref 1.9–3.7)
Glucose, Bld: 106 mg/dL — ABNORMAL HIGH (ref 65–99)
Potassium: 3.6 mmol/L (ref 3.5–5.3)
Sodium: 141 mmol/L (ref 135–146)
Total Bilirubin: 0.5 mg/dL (ref 0.2–1.2)
Total Protein: 5.6 g/dL — ABNORMAL LOW (ref 6.1–8.1)

## 2017-05-02 LAB — HEMOGLOBIN A1C
Hgb A1c MFr Bld: 5.4 % of total Hgb (ref <5.7)
Mean Plasma Glucose: 108 (calc)
eAG (mmol/L): 6 (calc)

## 2017-05-02 LAB — LIPID PANEL
Cholesterol: 186 mg/dL (ref <200)
HDL: 39 mg/dL — ABNORMAL LOW (ref >40)
LDL Cholesterol (Calc): 115 mg/dL (calc) — ABNORMAL HIGH
Non-HDL Cholesterol (Calc): 147 mg/dL (calc) — ABNORMAL HIGH (ref <130)
Total CHOL/HDL Ratio: 4.8 (calc) (ref <5.0)
Triglycerides: 200 mg/dL — ABNORMAL HIGH (ref <150)

## 2017-05-03 ENCOUNTER — Encounter: Payer: Self-pay | Admitting: *Deleted

## 2017-05-04 ENCOUNTER — Ambulatory Visit (INDEPENDENT_AMBULATORY_CARE_PROVIDER_SITE_OTHER): Payer: PPO | Admitting: Internal Medicine

## 2017-05-04 ENCOUNTER — Encounter: Payer: Self-pay | Admitting: Internal Medicine

## 2017-05-04 VITALS — BP 128/70 | HR 71 | Temp 98.1°F | Wt 275.0 lb

## 2017-05-04 DIAGNOSIS — I10 Essential (primary) hypertension: Secondary | ICD-10-CM

## 2017-05-04 DIAGNOSIS — M159 Polyosteoarthritis, unspecified: Secondary | ICD-10-CM

## 2017-05-04 DIAGNOSIS — K222 Esophageal obstruction: Secondary | ICD-10-CM

## 2017-05-04 DIAGNOSIS — W57XXXA Bitten or stung by nonvenomous insect and other nonvenomous arthropods, initial encounter: Secondary | ICD-10-CM

## 2017-05-04 DIAGNOSIS — M150 Primary generalized (osteo)arthritis: Secondary | ICD-10-CM

## 2017-05-04 DIAGNOSIS — E669 Obesity, unspecified: Secondary | ICD-10-CM | POA: Diagnosis not present

## 2017-05-04 DIAGNOSIS — N401 Enlarged prostate with lower urinary tract symptoms: Secondary | ICD-10-CM | POA: Diagnosis not present

## 2017-05-04 DIAGNOSIS — Z23 Encounter for immunization: Secondary | ICD-10-CM

## 2017-05-04 DIAGNOSIS — E784 Other hyperlipidemia: Secondary | ICD-10-CM

## 2017-05-04 DIAGNOSIS — H401110 Primary open-angle glaucoma, right eye, stage unspecified: Secondary | ICD-10-CM | POA: Diagnosis not present

## 2017-05-04 DIAGNOSIS — E7849 Other hyperlipidemia: Secondary | ICD-10-CM

## 2017-05-04 DIAGNOSIS — M8949 Other hypertrophic osteoarthropathy, multiple sites: Secondary | ICD-10-CM

## 2017-05-04 DIAGNOSIS — R351 Nocturia: Secondary | ICD-10-CM | POA: Diagnosis not present

## 2017-05-04 MED ORDER — EZETIMIBE 10 MG PO TABS: 10.0000 mg | ORAL_TABLET | Freq: Every morning | ORAL | 3 refills | Status: DC

## 2017-05-04 NOTE — Progress Notes (Signed)
Location:  [facility] clinic Provider:  Thaxton Pelley L. Mariea Clonts, D.O., C.M.D.  Code Status: DNR Goals of Care:  Advanced Directives 05/04/2017  Does Patient Have a Medical Advance Directive? Yes  Type of Paramedic of [place];Living will  Does patient want to make changes to medical advance directive? -  Copy of [place] in Chart? No - copy requested   Chief Complaint  Patient presents with  . Medical Management of Chronic Issues    38mth follow-up, transfer from Dr. Nyoka Cowden    HPI: Patient is a 66 y.o. male seen today for medical management of chronic diseases and transfer from Dr. Nyoka Cowden.  Goes by [facility]    Has struggled with his weight and would love to get off of some of his medications.  Was going to the gym quite a bit, but then family moved in.  They got their own house now and thinks he could get back to that.  He's had both knees and hips replaced, so limited some by that.  Had a bug bite in his left groin area--got very red, now faint purple.  Was about 1x1 in.  Lowest weight on record here is 252 lbs.  Has one soda per day.  Does try to watch his sodium intake.  Has thought about cutting out red meat which he has 1-2 times per week.    He's been short of breath here lately--getting sob when talking even or walking across the room.  At the first of the week, he was having severe pain in the right side of his chest.  Has not done anything to pull a muscle or anything.  No reproducible pain.  POX 96%.  He's seen Dr. Claiborne Billings for chest pains before.    HTN:  BP at goal with benazepril, hctz, amlodipine.    GERD, esophageal stenosis, pharyngoesophageal dysphagia:  Sees GI at [facility]--due for cscope also--he's on a 5 year plan.  He wants to stretch his throat, but he has not given into that.  He gets the hiccups almost each am.  Has notable indigestion.  Has after meals, worse late in the evening sometimes.  Has had some regurgitation, also.  Seeing GI every 6 mos.     BPH:  On proscar.  Gets up "right much" at night.  Not changing.   Lab Results  Component Value Date   PSA 1.9 10/30/2013   Arthritis/DJD:  No longer using hydrocodone generally.  Had periodically taken the muscle relaxer for back pain when he's out in the yard or gets muscle spasms.  Glaucoma:  Cont brimonidine and dorsolamide/timolol.  Right eye.  Goes to [facility] now.  Had a retina detachment.  Dr. Yehuda Mao.  Hyperlipidemia;  Cont zetia therapy.   Lab Results  Component Value Date   CHOL 186 05/01/2017   CHOL 187 10/27/2016   CHOL 151 08/01/2016   Lab Results  Component Value Date   HDL 39 (L) 05/01/2017   HDL 39 (L) 10/27/2016   HDL 45 08/01/2016   Lab Results  Component Value Date   LDLCALC 113 (H) 10/27/2016   LDLCALC 82 08/01/2016   LDLCALC 105 (H) 01/25/2016   Lab Results  Component Value Date   TRIG 200 (H) 05/01/2017   TRIG 173 (H) 10/27/2016   TRIG 118 08/01/2016   Lab Results  Component Value Date   CHOLHDL 4.8 05/01/2017   CHOLHDL 4.8 10/27/2016   CHOLHDL 3.4 08/01/2016   No results found for: LDLDIRECT  Hyperglycemia:   Lab Results  Component Value Date   HGBA1C 5.4 05/01/2017   Left great toenail was removed July 26th and requests me to check it.  Past Medical History:  Diagnosis Date  . Benign neoplasm of colon   . Benign neoplasm of skin of upper limb, including shoulder   . Carpal tunnel syndrome   . Chest pain    a. normal cors by cath in 2015  . Diaphragmatic hernia without mention of obstruction or gangrene   . Dyslipidemia   . Elevated prostate specific antigen (PSA)   . GERD (gastroesophageal reflux disease)   . Herpes zoster without mention of complication   . Hypertension   . Hypertension   . Hypertrophy of prostate with urinary obstruction and other lower urinary tract symptoms (LUTS)   . Impotence of organic origin   . Lumbago   . Obesity, unspecified   . Osteoarthrosis, unspecified whether generalized or localized, lower  leg   . Other and unspecified hyperlipidemia   . Other malaise and fatigue   . Other seborrheic keratosis   . Special screening for malignant neoplasm of prostate   . Unspecified tinnitus     Past Surgical History:  Procedure Laterality Date  . CARDIOVASCULAR STRESS TEST  06/15/2012   Non-diagnostic for ischemia. No lexiscan EKG changes.  . CHOLECYSTECTOMY  1992  . EYE SURGERY    . eyeband  2006   removal of right eyeband  . INGUINAL HERNIA REPAIR  1984   left  . INTRAOCULAR LENS REMOVAL  2008   right  . LEFT HEART CATHETERIZATION WITH CORONARY ANGIOGRAM N/A 09/05/2013   Procedure: LEFT HEART CATHETERIZATION WITH CORONARY ANGIOGRAM;  Surgeon: Troy Sine, MD;  Location[facility] C[facility] LAB;  Service: Cardiovascular;  Laterality: N/A;  . LOWER VENOUS EXTREMITY DOPPLER  01/26/2011   No evidence of thrombus or thrombophlebitis.  Marland Kitchen RETINAL DETACHMENT SURGERY  2001   right  . TOTAL HIP ARTHROPLASTY  2012   bilateral  . TOTAL KNEE ARTHROPLASTY  2011, 2012   bilateral  . TRANSTHORACIC ECHOCARDIOGRAM  10/05/2009   EF >55%, normal LV size, systolic, and diastolic function.    Allergies  Allergen Reactions  . Codeine Other (See Comments)    Headache  . Lipitor [Atorvastatin] Other (See Comments)    Leg pain/cramps  . Red Yeast Rice [Cholestin] Other (See Comments)    Makes legs hurt  . Statins Other (See Comments)    "muscle break down"  . Latex Rash    Allergies as of 05/04/2017      Reactions   Codeine Other (See Comments)   Headache   Lipitor [atorvastatin] Other (See Comments)   Leg pain/cramps   Red Yeast Rice [cholestin] Other (See Comments)   Makes legs hurt   Statins Other (See Comments)   "muscle break down"   Latex Rash      Medication List       Accurate as of 05/04/17  8:28 AM. Always use your most recent med list.          amLODipine 5 MG tablet Commonly known as:  NORVASC TAKE 1.5 TABLETS (7.5 MG TOTAL) BY MOUTH EVERY MORNING.   aspirin EC 81 MG  tablet Take 81 mg by mouth every morning.   benazepril 40 MG tablet Commonly known as:  LOTENSIN TAKE 1 TABLET (40 MG TOTAL) BY MOUTH EVERY MORNING.   brimonidine 0.2 % ophthalmic solution Commonly known as:  ALPHAGAN Place 1 drop into the right  eye 2 (two) times daily.   dorzolamide-timolol 22.3-6.8 MG/ML ophthalmic solution Commonly known as:  COSOPT Place 1 drop into the right eye 2 (two) times daily.   esomeprazole 40 MG capsule Commonly known as:  NEXIUM Take 1 capsule (40 mg total) by mouth at bedtime. For reflux   ezetimibe 10 MG tablet Commonly known as:  ZETIA Take 1 tablet (10 mg total) by mouth every morning.   famotidine 20 MG tablet Commonly known as:  PEPCID One at bedtime to reduce stomach acid   finasteride 5 MG tablet Commonly known as:  PROSCAR TAKE 1 TABLET (5 MG TOTAL) BY MOUTH EVERY MORNING.   hydrochlorothiazide 25 MG tablet Commonly known as:  HYDRODIURIL TAKE 1 TABLET (25 MG TOTAL) BY MOUTH EVERY MORNING.   nitroGLYCERIN 0.4 MG SL tablet Commonly known as:  NITROSTAT Place 1 tablet (0.4 mg total) under the tongue every 5 (five) minutes as needed for chest pain.   triamcinolone cream 0.1 % Commonly known as:  KENALOG Apply 1 application topically 2 (two) times daily.      Review of Systems:  Review of Systems  Constitutional: Negative for chills, fever and malaise/fatigue.  HENT: Negative for congestion and hearing loss.   Eyes: Negative for blurred vision.       Glaucoma and h/o retinal detachment  Respiratory: Positive for shortness of breath. Negative for cough.   Cardiovascular: Negative for chest pain, palpitations and leg swelling.  Gastrointestinal: Positive for heartburn. Negative for abdominal pain, blood in stool, constipation, melena, nausea and vomiting.       Some regurgitation   Genitourinary: Positive for frequency. Negative for dysuria and urgency.       Nocturia  Musculoskeletal: Positive for back pain and joint pain.  Negative for falls and myalgias.  Skin: Negative for itching and rash.  Neurological: Negative for dizziness, loss of consciousness and weakness.  Endo/Heme/Allergies: Does not bruise/bleed easily.  Psychiatric/Behavioral: Negative for depression and memory loss. The patient is not nervous/anxious and does not have insomnia.     Health Maintenance  Topic Date Due  . Hepatitis C Screening  1951-07-06  . INFLUENZA VACCINE  03/22/2017  . TETANUS/TDAP  08/29/2021  . COLONOSCOPY  05/08/2022  . PNA vac Low Risk Adult  Excluded    Physical Exam: Vitals:   05/04/17 0801  BP: 128/70  Pulse: 71  Temp: 98.1 F (36.7 C)  TempSrc: Oral  SpO2: 96%  Weight: 275 lb (124.7 kg)   Body mass index is 37.3 kg/m. Physical Exam  Constitutional: He is oriented to person, place, and time. He appears well-developed and well-nourished. No distress.  HENT:  Head: Normocephalic and atraumatic.  Cardiovascular: Normal rate, regular rhythm, normal heart sounds and intact distal pulses.   Pulmonary/Chest: Effort normal and breath sounds normal. No respiratory distress.  Abdominal: Soft. Bowel sounds are normal. He exhibits no distension. There is no tenderness.  Fascia not approximating when lies down  Musculoskeletal: Normal range of motion. He exhibits no tenderness.  S/p TKA bilaterally and total hips  Neurological: He is alert and oriented to person, place, and time.  Skin: Skin is warm and dry. Capillary refill takes less than 2 seconds.  Left great toenail with scab on medial aspect  Psychiatric: He has a normal mood and affect.    Labs reviewed: Basic Metabolic Panel:  Recent Labs  08/01/16 0822 10/27/16 0825 05/01/17 0827  NA 142 142 141  K 4.2 4.0 3.6  CL 106 106 106  CO2  30 25 29   GLUCOSE 92 96 106*  BUN 16 19 12   CREATININE 0.83 1.08 0.93  CALCIUM 9.1 9.1 9.3   Liver Function Tests:  Recent Labs  08/01/16 0822 10/27/16 0825 05/01/17 0827  AST 17 17 17   ALT 26 24 23    ALKPHOS 60 71  --   BILITOT 0.4 0.6 0.5  PROT 5.5* 5.9* 5.6*  ALBUMIN 3.9 4.1  --    No results for input(s): LIPASE, AMYLASE in the last 8760 hours. No results for input(s): AMMONIA in the last 8760 hours. CBC: No results for input(s): WBC, NEUTROABS, HGB, HCT, MCV, PLT in the last 8760 hours. Lipid Panel:  Recent Labs  08/01/16 0822 10/27/16 0825 05/01/17 0827  CHOL 151 187 186  HDL 45 39* 39*  LDLCALC 82 113*  --   TRIG 118 173* 200*  CHOLHDL 3.4 4.8 4.8   Lab Results  Component Value Date   HGBA1C 5.4 05/01/2017   Assessment/Plan 1. Essential hypertension -bp at goal with current regimen, cont same - CBC with Differential/Platelet; Future - COMPLETE METABOLIC PANEL WITH GFR; Future  2. Esophageal stenosis -in need of dilation and his regular cscope--f/u with GI at [facility]  3. Primary osteoarthritis involving multiple joints -still has some stiffness and less mobility since knee and hip replacements, limits activity some but plans to try cycling   4. Insect bite, initial encounter -seems benign in left groin  5. Obesity (BMI 30-39.9) - discussed dietary changes with DASH diet (given handout) and advised to exercise cardio 30 mins 5 days per week like stationary bike - Hemoglobin A1c; Future - Lipid panel; Future  6. Other hyperlipidemia - needs to lose weight to improve this and get off bp meds as he'd like - Lipid panel; Future - ezetimibe (ZETIA) 10 MG tablet; Take 1 tablet (10 mg total) by mouth every morning.  Dispense: 90 tablet; Refill: 3  7. Primary open angle glaucoma of right eye, unspecified glaucoma stage -cont drops and ophtho f/u  8. Benign prostatic hyperplasia with nocturia -cont finasteride and check PSA with labs before next visit at pt request - PSA; Future  9. Need for influenza vaccination - Flu vaccine HIGH DOSE PF (Fluzone High dose)  Labs/tests ordered:   Orders Placed This Encounter  Procedures  . Flu vaccine HIGH DOSE PF  (Fluzone High dose)  . CBC with Differential/Platelet    Standing Status:   Future    Standing Expiration Date:   05/04/2018  . COMPLETE METABOLIC PANEL WITH GFR    Standing Status:   Future    Standing Expiration Date:   05/04/2018  . Hemoglobin A1c    Standing Status:   Future    Standing Expiration Date:   05/04/2018  . Lipid panel    Standing Status:   Future    Standing Expiration Date:   05/04/2018  . PSA    Standing Status:   Future    Standing Expiration Date:   05/04/2018   Next appt:  6 mos med mgt, labs before  Zuzanna Maroney L. Trenia Tennyson, D.O. [facility] [address], [place] [address] Cell Phone (Mon-Fri 8am-5pm):  [phone] On Call:  [phone] & follow prompts after 5pm & weekends Office Phone:  [phone] Office Fax:  [phone]

## 2017-05-04 NOTE — Patient Instructions (Addendum)
DASH Eating Plan DASH stands for "Dietary Approaches to Stop Hypertension." The DASH eating plan is a healthy eating plan that has been shown to reduce high blood pressure (hypertension). It may also reduce your risk for type 2 diabetes, heart disease, and stroke. The DASH eating plan may also help with weight loss. What are tips for following this plan? General guidelines  Avoid eating more than 2,300 mg (milligrams) of salt (sodium) a day. If you have hypertension, you may need to reduce your sodium intake to 1,500 mg a day.  Limit alcohol intake to no more than 1 drink a day for nonpregnant women and 2 drinks a day for men. One drink equals 12 oz of beer, 5 oz of wine, or 1 oz of hard liquor.  Work with your health care provider to maintain a healthy body weight or to lose weight. Ask what an ideal weight is for you.  Get at least 30 minutes of exercise that causes your heart to beat faster (aerobic exercise) most days of the week. Activities may include walking, swimming, or biking.  Work with your health care provider or diet and nutrition specialist (dietitian) to adjust your eating plan to your individual calorie needs. Reading food labels  Check food labels for the amount of sodium per serving. Choose foods with less than 5 percent of the Daily Value of sodium. Generally, foods with less than 300 mg of sodium per serving fit into this eating plan.  To find whole grains, look for the word "whole" as the first word in the ingredient list. Shopping  Buy products labeled as "low-sodium" or "no salt added."  Buy fresh foods. Avoid canned foods and premade or frozen meals. Cooking  Avoid adding salt when cooking. Use salt-free seasonings or herbs instead of table salt or sea salt. Check with your health care provider or pharmacist before using salt substitutes.  Do not fry foods. Cook foods using healthy methods such as baking, boiling, grilling, and broiling instead.  Cook with  heart-healthy oils, such as olive, canola, soybean, or sunflower oil. Meal planning   Eat a balanced diet that includes: ? 5 or more servings of fruits and vegetables each day. At each meal, try to fill half of your plate with fruits and vegetables. ? Up to 6-8 servings of whole grains each day. ? Less than 6 oz of lean meat, poultry, or fish each day. A 3-oz serving of meat is about the same size as a deck of cards. One egg equals 1 oz. ? 2 servings of low-fat dairy each day. ? A serving of nuts, seeds, or beans 5 times each week. ? Heart-healthy fats. Healthy fats called Omega-3 fatty acids are found in foods such as flaxseeds and coldwater fish, like sardines, salmon, and mackerel.  Limit how much you eat of the following: ? Canned or prepackaged foods. ? Food that is high in trans fat, such as fried foods. ? Food that is high in saturated fat, such as fatty meat. ? Sweets, desserts, sugary drinks, and other foods with added sugar. ? Full-fat dairy products.  Do not salt foods before eating.  Try to eat at least 2 vegetarian meals each week.  Eat more home-cooked food and less restaurant, buffet, and fast food.  When eating at a restaurant, ask that your food be prepared with less salt or no salt, if possible. What foods are recommended? The items listed may not be a complete list. Talk with your dietitian about what   dietary choices are best for you. Grains Whole-grain or whole-wheat bread. Whole-grain or whole-wheat pasta. Brown rice. Oatmeal. Quinoa. Bulgur. Whole-grain and low-sodium cereals. Pita bread. Low-fat, low-sodium crackers. Whole-wheat flour tortillas. Vegetables Fresh or frozen vegetables (raw, steamed, roasted, or grilled). Low-sodium or reduced-sodium tomato and vegetable juice. Low-sodium or reduced-sodium tomato sauce and tomato paste. Low-sodium or reduced-sodium canned vegetables. Fruits All fresh, dried, or frozen fruit. Canned fruit in natural juice (without  added sugar). Meat and other protein foods Skinless chicken or turkey. Ground chicken or turkey. Pork with fat trimmed off. Fish and seafood. Egg whites. Dried beans, peas, or lentils. Unsalted nuts, nut butters, and seeds. Unsalted canned beans. Lean cuts of beef with fat trimmed off. Low-sodium, lean deli meat. Dairy Low-fat (1%) or fat-free (skim) milk. Fat-free, low-fat, or reduced-fat cheeses. Nonfat, low-sodium ricotta or cottage cheese. Low-fat or nonfat yogurt. Low-fat, low-sodium cheese. Fats and oils Soft margarine without trans fats. Vegetable oil. Low-fat, reduced-fat, or light mayonnaise and salad dressings (reduced-sodium). Canola, safflower, olive, soybean, and sunflower oils. Avocado. Seasoning and other foods Herbs. Spices. Seasoning mixes without salt. Unsalted popcorn and pretzels. Fat-free sweets. What foods are not recommended? The items listed may not be a complete list. Talk with your dietitian about what dietary choices are best for you. Grains Baked goods made with fat, such as croissants, muffins, or some breads. Dry pasta or rice meal packs. Vegetables Creamed or fried vegetables. Vegetables in a cheese sauce. Regular canned vegetables (not low-sodium or reduced-sodium). Regular canned tomato sauce and paste (not low-sodium or reduced-sodium). Regular tomato and vegetable juice (not low-sodium or reduced-sodium). Pickles. Olives. Fruits Canned fruit in a light or heavy syrup. Fried fruit. Fruit in cream or butter sauce. Meat and other protein foods Fatty cuts of meat. Ribs. Fried meat. Bacon. Sausage. Bologna and other processed lunch meats. Salami. Fatback. Hotdogs. Bratwurst. Salted nuts and seeds. Canned beans with added salt. Canned or smoked fish. Whole eggs or egg yolks. Chicken or turkey with skin. Dairy Whole or 2% milk, cream, and half-and-half. Whole or full-fat cream cheese. Whole-fat or sweetened yogurt. Full-fat cheese. Nondairy creamers. Whipped toppings.  Processed cheese and cheese spreads. Fats and oils Butter. Stick margarine. Lard. Shortening. Ghee. Bacon fat. Tropical oils, such as coconut, palm kernel, or palm oil. Seasoning and other foods Salted popcorn and pretzels. Onion salt, garlic salt, seasoned salt, table salt, and sea salt. Worcestershire sauce. Tartar sauce. Barbecue sauce. Teriyaki sauce. Soy sauce, including reduced-sodium. Steak sauce. Canned and packaged gravies. Fish sauce. Oyster sauce. Cocktail sauce. Horseradish that you find on the shelf. Ketchup. Mustard. Meat flavorings and tenderizers. Bouillon cubes. Hot sauce and Tabasco sauce. Premade or packaged marinades. Premade or packaged taco seasonings. Relishes. Regular salad dressings. Where to find more information:  National Heart, Lung, and Blood Institute: www.nhlbi.nih.gov  American Heart Association: www.heart.org Summary  The DASH eating plan is a healthy eating plan that has been shown to reduce high blood pressure (hypertension). It may also reduce your risk for type 2 diabetes, heart disease, and stroke.  With the DASH eating plan, you should limit salt (sodium) intake to 2,300 mg a day. If you have hypertension, you may need to reduce your sodium intake to 1,500 mg a day.  When on the DASH eating plan, aim to eat more fresh fruits and vegetables, whole grains, lean proteins, low-fat dairy, and heart-healthy fats.  Work with your health care provider or diet and nutrition specialist (dietitian) to adjust your eating plan to your individual   calorie needs. This information is not intended to replace advice given to you by your health care provider. Make sure you discuss any questions you have with your health care provider. Document Released: 07/28/2011 Document Revised: 08/01/2016 Document Reviewed: 08/01/2016 Elsevier Interactive Patient Education  2017 [employer].  30 minutes 5 days per week of aerobic exercise like walking, swimming or riding a bike.  Goal  weight 250 lbs.

## 2017-05-23 ENCOUNTER — Other Ambulatory Visit: Payer: Self-pay | Admitting: *Deleted

## 2017-05-23 ENCOUNTER — Encounter: Payer: Self-pay | Admitting: Internal Medicine

## 2017-05-23 MED ORDER — ESOMEPRAZOLE MAGNESIUM 40 MG PO CPDR: 40.0000 mg | DELAYED_RELEASE_CAPSULE | Freq: Every day | ORAL | 1 refills | Status: DC

## 2017-07-04 DIAGNOSIS — H40053 Ocular hypertension, bilateral: Secondary | ICD-10-CM | POA: Diagnosis not present

## 2017-07-10 ENCOUNTER — Encounter: Payer: Self-pay | Admitting: Internal Medicine

## 2017-07-10 ENCOUNTER — Ambulatory Visit: Payer: PPO | Admitting: Internal Medicine

## 2017-07-10 VITALS — BP 136/70 | HR 71 | Temp 98.2°F | Wt 256.0 lb

## 2017-07-10 DIAGNOSIS — B9789 Other viral agents as the cause of diseases classified elsewhere: Secondary | ICD-10-CM

## 2017-07-10 DIAGNOSIS — J069 Acute upper respiratory infection, unspecified: Secondary | ICD-10-CM | POA: Diagnosis not present

## 2017-07-10 MED ORDER — HYDROCOD POLST-CPM POLST ER 10-8 MG/5ML PO SUER: 5.0000 mL | Freq: Every evening | ORAL | 0 refills | Status: DC | PRN

## 2017-07-10 NOTE — Patient Instructions (Signed)
Upper Respiratory Infection, Adult Most upper respiratory infections (URIs) are a viral infection of the air passages leading to the lungs. A URI affects the nose, throat, and upper air passages. The most common type of URI is nasopharyngitis and is typically referred to as "the common cold." URIs run their course and usually go away on their own. Most of the time, a URI does not require medical attention, but sometimes a bacterial infection in the upper airways can follow a viral infection. This is called a secondary infection. Sinus and middle ear infections are common types of secondary upper respiratory infections. Bacterial pneumonia can also complicate a URI. A URI can worsen asthma and chronic obstructive pulmonary disease (COPD). Sometimes, these complications can require emergency medical care and may be life threatening. What are the causes? Almost all URIs are caused by viruses. A virus is a type of germ and can spread from one person to another. What increases the risk? You may be at risk for a URI if:  You smoke.  You have chronic heart or lung disease.  You have a weakened defense (immune) system.  You are very young or very old.  You have nasal allergies or asthma.  You work in crowded or poorly ventilated areas.  You work in health care facilities or schools.  What are the signs or symptoms? Symptoms typically develop 2-3 days after you come in contact with a cold virus. Most viral URIs last 7-10 days. However, viral URIs from the influenza virus (flu virus) can last 14-18 days and are typically more severe. Symptoms may include:  Runny or stuffy (congested) nose.  Sneezing.  Cough.  Sore throat.  Headache.  Fatigue.  Fever.  Loss of appetite.  Pain in your forehead, behind your eyes, and over your cheekbones (sinus pain).  Muscle aches.  How is this diagnosed? Your health care provider may diagnose a URI by:  Physical exam.  Tests to check that your  symptoms are not due to another condition such as: ? Strep throat. ? Sinusitis. ? Pneumonia. ? Asthma.  How is this treated? A URI goes away on its own with time. It cannot be cured with medicines, but medicines may be prescribed or recommended to relieve symptoms. Medicines may help:  Reduce your fever.  Reduce your cough.  Relieve nasal congestion.  Follow these instructions at home:  Take medicines only as directed by your health care provider.  Gargle warm saltwater or take cough drops to comfort your throat as directed by your health care provider.  Use a warm mist humidifier or inhale steam from a shower to increase air moisture. This may make it easier to breathe.  Drink enough fluid to keep your urine clear or pale yellow.  Eat soups and other clear broths and maintain good nutrition.  Rest as needed.  Return to work when your temperature has returned to normal or as your health care provider advises. You may need to stay home longer to avoid infecting others. You can also use a face mask and careful hand washing to prevent spread of the virus.  Increase the usage of your inhaler if you have asthma.  Do not use any tobacco products, including cigarettes, chewing tobacco, or electronic cigarettes. If you need help quitting, ask your health care provider. How is this prevented? The best way to protect yourself from getting a cold is to practice good hygiene.  Avoid oral or hand contact with people with cold symptoms.  Wash your   hands often if contact occurs.  There is no clear evidence that vitamin C, vitamin E, echinacea, or exercise reduces the chance of developing a cold. However, it is always recommended to get plenty of rest, exercise, and practice good nutrition. Contact a health care provider if:  You are getting worse rather than better.  Your symptoms are not controlled by medicine.  You have chills.  You have worsening shortness of breath.  You have  brown or red mucus.  You have yellow or brown nasal discharge.  You have pain in your face, especially when you bend forward.  You have a fever.  You have swollen neck glands.  You have pain while swallowing.  You have white areas in the back of your throat. Get help right away if:  You have severe or persistent: ? Headache. ? Ear pain. ? Sinus pain. ? Chest pain.  You have chronic lung disease and any of the following: ? Wheezing. ? Prolonged cough. ? Coughing up blood. ? A change in your usual mucus.  You have a stiff neck.  You have changes in your: ? Vision. ? Hearing. ? Thinking. ? Mood. This information is not intended to replace advice given to you by your health care provider. Make sure you discuss any questions you have with your health care provider. Document Released: 02/01/2001 Document Revised: 04/10/2016 Document Reviewed: 11/13/2013 Elsevier Interactive Patient Education  2017 [employer]  

## 2017-07-10 NOTE — Progress Notes (Signed)
Location:  [facility] clinic Provider: Laverne Hursey L. Mariea Clonts, D.O., C.M.D.  Goals of Care:  Advanced Directives 05/04/2017  Does Patient Have a Medical Advance Directive? Yes  Type of Paramedic of [place];Living will  Does patient want to make changes to medical advance directive? -  Copy of [place] in Chart? No - copy requested   Chief Complaint  Patient presents with  . Acute Visit    cold x2 days    HPI: Patient is a 66 y.o. male seen today for an acute visit for a cold for 2 days.  He sent a mychart message indicating sore throat, congestion, headache and cough.  He is using coricidin cold and flu.   Now ears are hurting too.  Wife has also had it.  No fever.  Felt warm here, but temp was normal.  Can't breathe and swallow.    Can't tell that his medicine otc is helping.  He had walking pneumonia several years ago and doesn't want that.    Past Medical History:  Diagnosis Date  . Benign neoplasm of colon   . Benign neoplasm of skin of upper limb, including shoulder   . Carpal tunnel syndrome   . Chest pain    a. normal cors by cath in 2015  . Diaphragmatic hernia without mention of obstruction or gangrene   . Dyslipidemia   . Elevated prostate specific antigen (PSA)   . GERD (gastroesophageal reflux disease)   . Herpes zoster without mention of complication   . Hypertension   . Hypertension   . Hypertrophy of prostate with urinary obstruction and other lower urinary tract symptoms (LUTS)   . Impotence of organic origin   . Lumbago   . Obesity, unspecified   . Osteoarthrosis, unspecified whether generalized or localized, lower leg   . Other and unspecified hyperlipidemia   . Other malaise and fatigue   . Other seborrheic keratosis   . Special screening for malignant neoplasm of prostate   . Unspecified tinnitus     Past Surgical History:  Procedure Laterality Date  . CARDIOVASCULAR STRESS TEST  06/15/2012   Non-diagnostic for  ischemia. No lexiscan EKG changes.  . CHOLECYSTECTOMY  1992  . EYE SURGERY    . eyeband  2006   removal of right eyeband  . INGUINAL HERNIA REPAIR  1984   left  . INTRAOCULAR LENS REMOVAL  2008   right  . LEFT HEART CATHETERIZATION WITH CORONARY ANGIOGRAM N/A 09/05/2013   Performed by Troy Sine, MD at [facility] CATH LAB  . LOWER VENOUS EXTREMITY DOPPLER  01/26/2011   No evidence of thrombus or thrombophlebitis.  Marland Kitchen RETINAL DETACHMENT SURGERY  2001   right  . TOTAL HIP ARTHROPLASTY  2012   bilateral  . TOTAL KNEE ARTHROPLASTY  2011, 2012   bilateral  . TRANSTHORACIC ECHOCARDIOGRAM  10/05/2009   EF >55%, normal LV size, systolic, and diastolic function.    Allergies  Allergen Reactions  . Codeine Other (See Comments)    Headache  . Lipitor [Atorvastatin] Other (See Comments)    Leg pain/cramps  . Red Yeast Rice [Cholestin] Other (See Comments)    Makes legs hurt  . Statins Other (See Comments)    "muscle break down"  . Latex Rash    Outpatient Encounter Medications as of 07/10/2017  Medication Sig  . amLODipine (NORVASC) 5 MG tablet TAKE 1.5 TABLETS (7.5 MG TOTAL) BY MOUTH EVERY MORNING.  Marland Kitchen aspirin EC 81 MG tablet  Take 81 mg by mouth every morning.  . benazepril (LOTENSIN) 40 MG tablet TAKE 1 TABLET (40 MG TOTAL) BY MOUTH EVERY MORNING.  . brimonidine (ALPHAGAN) 0.2 % ophthalmic solution Place 1 drop into the right eye 2 (two) times daily.  . dorzolamide-timolol (COSOPT) 22.3-6.8 MG/ML ophthalmic solution Place 1 drop into the right eye 2 (two) times daily.  Marland Kitchen esomeprazole (NEXIUM) 40 MG capsule Take 1 capsule (40 mg total) by mouth at bedtime. For reflux  . ezetimibe (ZETIA) 10 MG tablet Take 1 tablet (10 mg total) by mouth every morning.  . famotidine (PEPCID) 20 MG tablet One at bedtime to reduce stomach acid  . finasteride (PROSCAR) 5 MG tablet TAKE 1 TABLET (5 MG TOTAL) BY MOUTH EVERY MORNING.  . hydrochlorothiazide (HYDRODIURIL) 25 MG tablet TAKE 1 TABLET (25 MG TOTAL) BY  MOUTH EVERY MORNING.  . nitroGLYCERIN (NITROSTAT) 0.4 MG SL tablet Place 1 tablet (0.4 mg total) under the tongue every 5 (five) minutes as needed for chest pain.  Marland Kitchen triamcinolone cream (KENALOG) 0.1 % Apply 1 application topically 2 (two) times daily.   No facility-administered encounter medications on file as of 07/10/2017.     Review of Systems:  Review of Systems  Constitutional: Positive for chills. Negative for fever and malaise/fatigue.  HENT: Positive for congestion, ear pain, sinus pain and sore throat.   Eyes: Negative for blurred vision.  Respiratory: Positive for cough. Negative for shortness of breath and stridor.   Cardiovascular: Negative for chest pain and palpitations.  Gastrointestinal: Negative for abdominal pain, blood in stool, constipation and melena.  Genitourinary: Negative for dysuria.  Musculoskeletal: Positive for neck pain. Negative for myalgias.  Neurological: Positive for headaches. Negative for weakness.  Psychiatric/Behavioral: Negative for memory loss.    Health Maintenance  Topic Date Due  . Hepatitis C Screening  [birth year]  . TETANUS/TDAP  08/29/2021  . COLONOSCOPY  05/08/2022  . INFLUENZA VACCINE  Completed  . PNA vac Low Risk Adult  Discontinued    Physical Exam: Vitals:   07/10/17 1139  BP: 136/70  Pulse: 71  Temp: 98.2 F (36.8 C)  TempSrc: Oral  SpO2: 97%  Weight: 256 lb (116.1 kg)   Body mass index is 34.72 kg/m. Physical Exam  Constitutional: He is oriented to person, place, and time. He appears well-developed and well-nourished. No distress.  HENT:  Head: Normocephalic and atraumatic.  Right Ear: External ear normal.  Left Ear: External ear normal.  Nose: Nose normal.  Mouth/Throat: Oropharynx is clear and moist. No oropharyngeal exudate.  Erythema of oropharynx  Eyes: Conjunctivae and EOM are normal. Pupils are equal, round, and reactive to light.  Neck: Neck supple. No thyromegaly present.  Cardiovascular: Normal  rate, regular rhythm, normal heart sounds and intact distal pulses.  Pulmonary/Chest: Effort normal and breath sounds normal. No respiratory distress.  Lymphadenopathy:    He has cervical adenopathy.  Neurological: He is alert and oriented to person, place, and time.  Skin: Skin is warm and dry.  Psychiatric: He has a normal mood and affect.    Labs reviewed: Basic Metabolic Panel: Recent Labs    08/01/16 0822 10/27/16 0825 05/01/17 0827  NA 142 142 141  K 4.2 4.0 3.6  CL 106 106 106  CO2 30 25 29   GLUCOSE 92 96 106*  BUN 16 19 12   CREATININE 0.83 1.08 0.93  CALCIUM 9.1 9.1 9.3   Liver Function Tests: Recent Labs    08/01/16 0822 10/27/16 0825 05/01/17 0827  AST  17 17 17   ALT 26 24 23   ALKPHOS 60 71  --   BILITOT 0.4 0.6 0.5  PROT 5.5* 5.9* 5.6*  ALBUMIN 3.9 4.1  --    No results for input(s): LIPASE, AMYLASE in the last 8760 hours. No results for input(s): AMMONIA in the last 8760 hours. CBC: No results for input(s): WBC, NEUTROABS, HGB, HCT, MCV, PLT in the last 8760 hours. Lipid Panel: Recent Labs    08/01/16 0822 10/27/16 0825 05/01/17 0827  CHOL 151 187 186  HDL 45 39* 39*  LDLCALC 82 113*  --   TRIG 118 173* 200*  CHOLHDL 3.4 4.8 4.8   Lab Results  Component Value Date   HGBA1C 5.4 05/01/2017   Assessment/Plan 1. Viral URI with cough - push po fluids, rest, try saline sinus wash (nettipot) to open up nose at hs, use prescription cough syrup to allow sleep with cough at hs - chlorpheniramine-HYDROcodone (TUSSIONEX PENNKINETIC ER) 10-8 MG/5ML SUER; Take 5 mLs at bedtime as needed by mouth for cough.  Dispense: 140 mL; Refill: 0  Labs/tests ordered: no new Next appt:  10/31/2017  Demauri Advincula L. Shaine Mount, D.O. [facility] [address], [place] [address] Cell Phone (Mon-Fri 8am-5pm):  [phone] On Call:  [phone] & follow prompts after 5pm & weekends Office Phone:  [phone] Office Fax:   [phone]

## 2017-07-11 DIAGNOSIS — Z96652 Presence of left artificial knee joint: Secondary | ICD-10-CM | POA: Diagnosis not present

## 2017-07-11 DIAGNOSIS — Z96642 Presence of left artificial hip joint: Secondary | ICD-10-CM | POA: Diagnosis not present

## 2017-08-17 ENCOUNTER — Encounter: Payer: Self-pay | Admitting: *Deleted

## 2017-08-28 ENCOUNTER — Ambulatory Visit: Payer: PPO | Admitting: Cardiovascular Disease

## 2017-08-28 ENCOUNTER — Encounter: Payer: Self-pay | Admitting: Cardiovascular Disease

## 2017-08-28 VITALS — BP 140/82 | HR 63 | Ht 72.0 in | Wt 253.2 lb

## 2017-08-28 DIAGNOSIS — I10 Essential (primary) hypertension: Secondary | ICD-10-CM

## 2017-08-28 DIAGNOSIS — K219 Gastro-esophageal reflux disease without esophagitis: Secondary | ICD-10-CM | POA: Diagnosis not present

## 2017-08-28 DIAGNOSIS — Z79899 Other long term (current) drug therapy: Secondary | ICD-10-CM | POA: Diagnosis not present

## 2017-08-28 DIAGNOSIS — E669 Obesity, unspecified: Secondary | ICD-10-CM

## 2017-08-28 DIAGNOSIS — E7849 Other hyperlipidemia: Secondary | ICD-10-CM

## 2017-08-28 DIAGNOSIS — E66811 Obesity, class 1: Secondary | ICD-10-CM

## 2017-08-28 MED ORDER — AMLODIPINE BESYLATE 10 MG PO TABS: 10.0000 mg | ORAL_TABLET | Freq: Every morning | ORAL | 3 refills | Status: DC

## 2017-08-28 NOTE — Patient Instructions (Signed)
Medication Instructions:  INCREASE amlodipine to 10 mg daily  Labwork: FASTING labs at PCP in March  Follow-Up: Your physician recommends that you schedule a follow-up appointment in: April with Dr. Claiborne Billings.   Any Other Special Instructions Will Be Listed Below (If Applicable).     If you need a refill on your cardiac medications before your next appointment, please call your pharmacy.

## 2017-08-28 NOTE — Progress Notes (Signed)
Patient ID: Anthony [name], male   DOB: 03/09/1951, 67 y.o.   MRN: [id number]     HPI: Anthony [name] is a 67 y.o. male who presents to the office for follow-up cardiology evaluation.  I have not seen him since February 2015.   Anthony [name] has a history of hypertension, mild obesity, as well as hyperlipidemia. In the past, he did develop CPK elevation secondary to statin therapy. He has been able to tolerate Zetia. A nuclear perfusion study done in October 2013 for chest pain showed normal perfusion without scar or ischemia.   Laboratory in October 2014 showed cholesterol 196, triglycerides significantly elevated at 291, HDL of 34, VLDL increased at 58, and  LDL 106. Renal function was normal. BUN 15 kerning 0.79.   He developed recurrent episodes of chest pain for which he saw Anthony [name] and because of exertionally precipitated episodes of discomfort definitive cardiac catheterization was recommended. This was done by me on 09/05/2013 and revealed normal coronary arteries with normal LV function without focal segmental wall motion abnormalities. Ejection fraction was 55%.  He underwent an echo Doppler study on 09/04/2013 which showed an ejection fraction of 55-60%. He did have grade 1 diastolic dysfunction and mild left ventricular hypertrophy. He had mild mitral annular calcification with mildly thickened leaflets and trivial mitral regurgitation. A moderate pressure estimate was 27 mm.  He was  seen by [name] on 08/17/2016 with complaints of chest discomfort.  He's episodes would occur often when he was walking on his driveway her in the grocery store and had been ongoing for 1-2 months.  He had diffuse tenderness to palpation along his left pectoral region.  On exam it was felt that some of his chest pain was musculoskeletal in etiology.  He was hypertensive and she further titrated amlodipine.  Because of exertional dyspnea.  She referred him for an echo Doppler study which was done on  08/23/2016.  This showed mild LVH with normal systolic function and grade 1 diastolic dysfunction.  There is very mild increased peak PA pressure 34 mm.  Most recently, the patient has been on amlodipine 7.5 mg, but has a pill 40 mg, for height.  Poor attention.  He has a history of GERD and is on Nexium 40 mg.  He is on finasteride 5 mg.  Recently, he has noticed some mild shortness of breath. .  He denies any significant exertionally precipitated chest pressure.  He has had arthritic issues and is status post bilateral knee replacement and also hip replacement.  He presents for evaluation.  Past Medical History:  Diagnosis Date  . Benign neoplasm of colon   . Benign neoplasm of skin of upper limb, including shoulder   . Carpal tunnel syndrome   . Chest pain    a. normal cors by cath in 2015  . Diaphragmatic hernia without mention of obstruction or gangrene   . Dyslipidemia   . Elevated prostate specific antigen (PSA)   . GERD (gastroesophageal reflux disease)   . Herpes zoster without mention of complication   . Hypertension   . Hypertension   . Hypertrophy of prostate with urinary obstruction and other lower urinary tract symptoms (LUTS)   . Impotence of organic origin   . Lumbago   . Obesity, unspecified   . Osteoarthrosis, unspecified whether generalized or localized, lower leg   . Other and unspecified hyperlipidemia   . Other malaise and fatigue   . Other seborrheic keratosis   .  Special screening for malignant neoplasm of prostate   . Unspecified tinnitus     Past Surgical History:  Procedure Laterality Date  . CARDIOVASCULAR STRESS TEST  06/15/2012   Non-diagnostic for ischemia. No lexiscan EKG changes.  . CHOLECYSTECTOMY  1992  . EYE SURGERY    . eyeband  2006   removal of right eyeband  . INGUINAL HERNIA REPAIR  1984   left  . INTRAOCULAR LENS REMOVAL  2008   right  . LEFT HEART CATHETERIZATION WITH CORONARY ANGIOGRAM N/A 09/05/2013   Procedure: LEFT HEART  CATHETERIZATION WITH CORONARY ANGIOGRAM;  Surgeon: Troy Sine, MD;  Location: [facility] CATH LAB;  Service: Cardiovascular;  Laterality: N/A;  . LOWER VENOUS EXTREMITY DOPPLER  01/26/2011   No evidence of thrombus or thrombophlebitis.  Marland Kitchen RETINAL DETACHMENT SURGERY  2001   right  . TOTAL HIP ARTHROPLASTY  2012   bilateral  . TOTAL KNEE ARTHROPLASTY  2011, 2012   bilateral  . TRANSTHORACIC ECHOCARDIOGRAM  10/05/2009   EF >55%, normal LV size, systolic, and diastolic function.    Allergies  Allergen Reactions  . Codeine Other (See Comments)    Headache  . Lipitor [Atorvastatin] Other (See Comments)    Leg pain/cramps  . Red Yeast Rice [Cholestin] Other (See Comments)    Makes legs hurt  . Statins Other (See Comments)    "muscle break down"  . Latex Rash    Current Outpatient Medications  Medication Sig Dispense Refill  . amLODipine (NORVASC) 10 MG tablet Take 1 tablet (10 mg total) by mouth every morning. 90 tablet 3  . aspirin EC 81 MG tablet Take 81 mg by mouth every morning.    . benazepril (LOTENSIN) 40 MG tablet TAKE 1 TABLET (40 MG TOTAL) BY MOUTH EVERY MORNING. 90 tablet 1  . brimonidine (ALPHAGAN) 0.2 % ophthalmic solution Place 1 drop into the right eye 2 (two) times daily.    . chlorpheniramine-HYDROcodone (TUSSIONEX PENNKINETIC ER) 10-8 MG/5ML SUER Take 5 mLs at bedtime as needed by mouth for cough. 140 mL 0  . dorzolamide-timolol (COSOPT) 22.3-6.8 MG/ML ophthalmic solution Place 1 drop into the right eye 2 (two) times daily.    Marland Kitchen esomeprazole (NEXIUM) 40 MG capsule Take 1 capsule (40 mg total) by mouth at bedtime. For reflux 90 capsule 1  . ezetimibe (ZETIA) 10 MG tablet Take 1 tablet (10 mg total) by mouth every morning. 90 tablet 3  . famotidine (PEPCID) 20 MG tablet One at bedtime to reduce stomach acid 30 tablet 5  . finasteride (PROSCAR) 5 MG tablet TAKE 1 TABLET (5 MG TOTAL) BY MOUTH EVERY MORNING. 90 tablet 1  . nitroGLYCERIN (NITROSTAT) 0.4 MG SL tablet Place 1 tablet  (0.4 mg total) under the tongue every 5 (five) minutes as needed for chest pain. 25 tablet 2   No current facility-administered medications for this visit.     Social History   Socioeconomic History  . Marital status: Married    Spouse name: Not on file  . Number of children: Not on file  . Years of education: Not on file  . Highest education level: Not on file  Social Needs  . Financial resource strain: Not on file  . Food insecurity - worry: Not on file  . Food insecurity - inability: Not on file  . Transportation needs - medical: Not on file  . Transportation needs - non-medical: Not on file  Occupational History  . Occupation: retired Medical sales representative     Comment:  [employer]  Tobacco Use  . Smoking status: Never Smoker  . Smokeless tobacco: Never Used  Substance and Sexual Activity  . Alcohol use: No  . Drug use: No  . Sexual activity: Yes    Partners: Female    Comment: wife  Other Topics Concern  . Not on file  Social History Narrative   Married   Never smoked   Alcohol none   Exercise -gym 3 days a week (join 3 months ago)   Living Will   Social is normal that he is married has 2 children and 2 grandchildren. He is not routinely exercise. There is no tobacco use. He does drink occasional alcohol.  Family History  Problem Relation Age of Onset  . Diabetes Mother   . Arrhythmia Mother   . Hypertension Mother   . Dementia Father   . Pulmonary embolism Father   . Hypertension Father   . Cancer Sister        Liver cancer - Histocytic lymphoma  . Cancer Sister 23       Skin cancer  . Hypertension Sister 89  . Diabetes Sister   . Stroke Brother   . Diabetes Brother   . Hypertension Brother   . Cancer Daughter   . Cancer Maternal Grandmother        Skin cancer  . Heart attack Maternal Grandfather   . Cancer Paternal Grandfather   . Hypertension Paternal Grandfather     ROS General: Negative; No fevers, chills, or night sweats;  positive for a 16  pound weight loss in September 2018 HEENT: Negative; No changes in vision or hearing, sinus congestion, difficulty swallowing Pulmonary: Negative; No cough, wheezing, shortness of breath, hemoptysis Cardiovascular: Positive for occasional exertional shortness of breath.  No exertional chest pain. GI: Positive for GERD  GU: Negative; No dysuria, hematuria, or difficulty voiding Musculoskeletal: Negative; no myalgias, joint pain, or weakness Hematologic/Oncology: Negative; no easy bruising, bleeding Endocrine: Negative; no heat/cold intolerance; no diabetes Neuro: Negative; no changes in balance, headaches Skin: Negative; No rashes or skin lesions Psychiatric: Negative; No behavioral problems, depression Sleep: Negative; No snoring, daytime sleepiness, hypersomnolence, bruxism, restless legs, hypnogognic hallucinations, no cataplexy Other comprehensive 14 point system review is negative.   PE BP 140/82   Pulse 63   Ht 6' (1.829 m)   Wt 253 lb 3.2 oz (114.9 kg)   BMI 34.34 kg/m    Repeat blood pressure by me was 132/82  Wt Readings from Last 3 Encounters:  08/28/17 253 lb 3.2 oz (114.9 kg)  07/10/17 256 lb (116.1 kg)  05/04/17 275 lb (124.7 kg)   General: Alert, oriented, no distress.  Skin: normal turgor, no rashes, warm and dry HEENT: Normocephalic, atraumatic. Pupils equal round and reactive to light; sclera anicteric; extraocular muscles intact; no xanthelasmas; Fundi disc flat.  No hemorrhages or exudates. Nose without nasal septal hypertrophy Mouth/Parynx benign; Mallinpatti scale 3 Neck: No JVD, no carotid bruits; normal carotid upstroke Lungs: clear to ausculatation and percussion; no wheezing or rales Chest wall: without tenderness to palpitation Heart: PMI not displaced, RRR, s1 s2 normal, 1/6 systolic murmur, no diastolic murmur, no rubs, gallops, thrills, or heaves Abdomen: soft, nontender; no hepatosplenomehaly, BS+; abdominal aorta nontender and not dilated by  palpation. Back: no CVA tenderness Pulses 2+ Musculoskeletal: full range of motion, normal strength, no joint deformities Extremities: no clubbing cyanosis or edema, Homan's sign negative  Neurologic: grossly nonfocal; Cranial nerves grossly wnl Psychologic: Normal mood and affect  ECG (independently read by me): Normal sinus rhythm at 63 bpm.  Normal intervals.  No ST segment changes.  (indepen February 2015 ECGdently read by me): Normal sinus rhythm at 66 beats per minute. PR interval 160 ms. QTC intervals 34 ms. No significant ST-T changes.  Prior ECG from November 2014 :Normal sinus rhythm at 69 beats per minute. No ectopy. Normal intervals.  LABS:  BMP Latest Ref Rng & Units 05/01/2017 10/27/2016 08/01/2016  Glucose 65 - 99 mg/dL 106(H) 96 92  BUN 7 - 25 mg/dL 12 19 16   Creatinine 0.70 - 1.25 mg/dL 0.93 1.08 0.83  BUN/Creat Ratio 6 - 22 (calc) NOT APPLICABLE - -  Sodium 720 - 146 mmol/L 141 142 142  Potassium 3.5 - 5.3 mmol/L 3.6 4.0 4.2  Chloride 98 - 110 mmol/L 106 106 106  CO2 20 - 32 mmol/L 29 25 30   Calcium 8.6 - 10.3 mg/dL 9.3 9.1 9.1   CBC Latest Ref Rng & Units 11/03/2013 11/03/2013 09/04/2013  WBC 4.0 - 10.5 K/uL - 15.3(H) 9.3  Hemoglobin 13.0 - 17.0 g/dL 13.9 14.0 16.4  Hematocrit 39.0 - 52.0 % 41.0 40.1 47.5  Platelets 150 - 400 K/uL - 194 200   No results found for: TSH  Lab Results  Component Value Date   HGBA1C 5.4 05/01/2017   Hepatic Function Latest Ref Rng & Units 05/01/2017 10/27/2016 08/01/2016  Total Protein 6.1 - 8.1 g/dL 5.6(L) 5.9(L) 5.5(L)  Albumin 3.6 - 5.1 g/dL - 4.1 3.9  AST 10 - 35 U/L 17 17 17   ALT 9 - 46 U/L 23 24 26   Alk Phosphatase 40 - 115 U/L - 71 60  Total Bilirubin 0.2 - 1.2 mg/dL 0.5 0.6 0.4   Lipid Panel     Component Value Date/Time   CHOL 186 05/01/2017 0827   CHOL 175 01/25/2016 0825   CHOL 191 07/22/2013 0835   TRIG 200 (H) 05/01/2017 0827   TRIG 253 (H) 07/22/2013 0835   HDL 39 (L) 05/01/2017 0827   HDL 46 01/25/2016 0825    HDL 38 (L) 07/22/2013 0835   CHOLHDL 4.8 05/01/2017 0827   VLDL 35 (H) 10/27/2016 0825   LDLCALC 113 (H) 10/27/2016 0825   LDLCALC 105 (H) 01/25/2016 0825   LDLCALC 102 (H) 07/22/2013 9470    RADIOLOGY: No results found.  IMPRESSION:  1. Essential hypertension   2. Other hyperlipidemia   3. Medication management   4. Gastroesophageal reflux disease without esophagitis   5. Obesity (BMI 30.0-34.9)     ASSESSMENT AND PLAN: Anthony [name] is a 67 year old gentleman who has a history of moderate obesity, hyperlipidemia, hypertension, and GERD. Because of recurrent episodes of chest pain which did seem to be precipitated by activity definitive cardiac catheterization in January 2015 revealed normal coronary arteries.  He has a history of obesity, but since September 2018.  He admits to at least a 16 lb weight loss.  He has had issues with occasional dyspnea.  He has had mild blood pressure elevation despite being on amlodipine 7.5 mg and benazepril 40 mg.  I have suggested increasing amlodipine to 10 mg daily.  He will monitor his blood pressure.  He has had arthritic issues, which has limited his exercise.  Currently he has been on Zetia 10 mg for hyperlipidemia.  Laboratory in 2018 showed an LDL of 115.  I reviewed his echo Doppler study from January 2018, which revealed normal systolic function with grade 1 diastolic relaxation impairment.  Peak PA pressure  was minimally increased at 34 mm.  His GERD has been controlled with Nexium.  We discussed the importance of continued weight loss with his BMI of 34.34.  I'm recommending follow-up laboratory be obtained in the fasting state in 2 months.  I will see him in April for reevaluation.    Time spent: 25 minutes Troy Sine, MD, [facility]  09/04/2017 1:32 PM

## 2017-09-04 ENCOUNTER — Encounter: Payer: Self-pay | Admitting: Cardiovascular Disease

## 2017-09-06 ENCOUNTER — Other Ambulatory Visit: Payer: Self-pay | Admitting: Internal Medicine

## 2017-09-06 DIAGNOSIS — I10 Essential (primary) hypertension: Secondary | ICD-10-CM

## 2017-09-07 ENCOUNTER — Telehealth: Payer: Self-pay

## 2017-09-07 NOTE — Telephone Encounter (Signed)
Called patient to try to schedule AWV in the office after his labs in march. No answer-left voicemail to call back.

## 2017-09-21 ENCOUNTER — Other Ambulatory Visit: Payer: Self-pay | Admitting: Internal Medicine

## 2017-09-24 ENCOUNTER — Other Ambulatory Visit: Payer: Self-pay | Admitting: Internal Medicine

## 2017-09-25 ENCOUNTER — Encounter: Payer: Self-pay | Admitting: Internal Medicine

## 2017-09-25 ENCOUNTER — Encounter: Payer: Self-pay | Admitting: Cardiovascular Disease

## 2017-09-26 ENCOUNTER — Other Ambulatory Visit: Payer: Self-pay | Admitting: *Deleted

## 2017-09-26 MED ORDER — HYDROCHLOROTHIAZIDE 25 MG PO TABS: 25.0000 mg | ORAL_TABLET | Freq: Every morning | ORAL | 3 refills | Status: DC

## 2017-10-31 ENCOUNTER — Other Ambulatory Visit: Payer: PPO

## 2017-10-31 ENCOUNTER — Other Ambulatory Visit: Payer: Self-pay

## 2017-10-31 ENCOUNTER — Ambulatory Visit (INDEPENDENT_AMBULATORY_CARE_PROVIDER_SITE_OTHER): Payer: PPO

## 2017-10-31 VITALS — BP 140/76 | HR 68 | Temp 97.7°F | Ht 72.0 in | Wt 254.0 lb

## 2017-10-31 DIAGNOSIS — E669 Obesity, unspecified: Secondary | ICD-10-CM | POA: Diagnosis not present

## 2017-10-31 DIAGNOSIS — Z23 Encounter for immunization: Secondary | ICD-10-CM | POA: Diagnosis not present

## 2017-10-31 DIAGNOSIS — Z0000 Encounter for general adult medical examination without abnormal findings: Secondary | ICD-10-CM | POA: Diagnosis not present

## 2017-10-31 DIAGNOSIS — E7849 Other hyperlipidemia: Secondary | ICD-10-CM

## 2017-10-31 DIAGNOSIS — R351 Nocturia: Principal | ICD-10-CM

## 2017-10-31 DIAGNOSIS — I10 Essential (primary) hypertension: Secondary | ICD-10-CM

## 2017-10-31 DIAGNOSIS — N401 Enlarged prostate with lower urinary tract symptoms: Secondary | ICD-10-CM

## 2017-10-31 MED ORDER — ZOSTER VAC RECOMB ADJUVANTED 50 MCG/0.5ML IM SUSR: 0.5000 mL | Freq: Once | INTRAMUSCULAR | 1 refills | Status: AC

## 2017-10-31 NOTE — Patient Instructions (Signed)
Anthony [name] , Thank you for taking time to come for your Medicare Wellness Visit. I appreciate your ongoing commitment to your health goals. Please review the following plan we discussed and let me know if I can assist you in the future.   Screening recommendations/referrals: Colonoscopy up to date, due 05/08/2022 Recommended yearly ophthalmology/optometry visit for glaucoma screening and checkup Recommended yearly dental visit for hygiene and checkup  Vaccinations: Influenza vaccine up to date, due 2019 fall season Pneumococcal vaccine 23 given today, up to date-complete Tdap vaccine up to date, due 08/29/2021 Shingles vaccine due, prescription sent to pharmacy    Advanced directives: Advance directive discussed with you today. I have provided a copy for you to complete at home and have notarized. Once this is complete please bring a copy in to our office so we can scan it into your chart.  Conditions/risks identified: none  Next appointment: Dr. Mariea Clonts 11/02/2017 @ 7:30am             Tyson Dense, RN 11/05/2018 @ 8:30am  Preventive Care 65 Years and Older, Male Preventive care refers to lifestyle choices and visits with your health care provider that can promote health and wellness. What does preventive care include?  A yearly physical exam. This is also called an annual well check.  Dental exams once or twice a year.  Routine eye exams. Ask your health care provider how often you should have your eyes checked.  Personal lifestyle choices, including:  Daily care of your teeth and gums.  Regular physical activity.  Eating a healthy diet.  Avoiding tobacco and drug use.  Limiting alcohol use.  Practicing safe sex.  Taking low doses of aspirin every day.  Taking vitamin and mineral supplements as recommended by your health care provider. What happens during an annual well check? The services and screenings done by your health care provider during your annual well check will  depend on your age, overall health, lifestyle risk factors, and family history of disease. Counseling  Your health care provider may ask you questions about your:  Alcohol use.  Tobacco use.  Drug use.  Emotional well-being.  Home and relationship well-being.  Sexual activity.  Eating habits.  History of falls.  Memory and ability to understand (cognition).  Work and work Statistician. Screening  You may have the following tests or measurements:  Height, weight, and BMI.  Blood pressure.  Lipid and cholesterol levels. These may be checked every 5 years, or more frequently if you are over 103 years old.  Skin check.  Lung cancer screening. You may have this screening every year starting at age 30 if you have a 30-pack-year history of smoking and currently smoke or have quit within the past 15 years.  Fecal occult blood test (FOBT) of the stool. You may have this test every year starting at age 34.  Flexible sigmoidoscopy or colonoscopy. You may have a sigmoidoscopy every 5 years or a colonoscopy every 10 years starting at age 80.  Prostate cancer screening. Recommendations will vary depending on your family history and other risks.  Hepatitis C blood test.  Hepatitis B blood test.  Sexually transmitted disease (STD) testing.  Diabetes screening. This is done by checking your blood sugar (glucose) after you have not eaten for a while (fasting). You may have this done every 1-3 years.  Abdominal aortic aneurysm (AAA) screening. You may need this if you are a current or former smoker.  Osteoporosis. You may be screened starting  at age 6 if you are at high risk. Talk with your health care provider about your test results, treatment options, and if necessary, the need for more tests. Vaccines  Your health care provider may recommend certain vaccines, such as:  Influenza vaccine. This is recommended every year.  Tetanus, diphtheria, and acellular pertussis (Tdap,  Td) vaccine. You may need a Td booster every 10 years.  Zoster vaccine. You may need this after age 65.  Pneumococcal 13-valent conjugate (PCV13) vaccine. One dose is recommended after age 39.  Pneumococcal polysaccharide (PPSV23) vaccine. One dose is recommended after age 27. Talk to your health care provider about which screenings and vaccines you need and how often you need them. This information is not intended to replace advice given to you by your health care provider. Make sure you discuss any questions you have with your health care provider. Document Released: 09/04/2015 Document Revised: 04/27/2016 Document Reviewed: 06/09/2015 Elsevier Interactive Patient Education  2017 [employer] Prevention in the Home Falls can cause injuries. They can happen to people of all ages. There are many things you can do to make your home safe and to help prevent falls. What can I do on the outside of my home?  Regularly fix the edges of walkways and driveways and fix any cracks.  Remove anything that might make you trip as you walk through a door, such as a raised step or threshold.  Trim any bushes or trees on the path to your home.  Use bright outdoor lighting.  Clear any walking paths of anything that might make someone trip, such as rocks or tools.  Regularly check to see if handrails are loose or broken. Make sure that both sides of any steps have handrails.  Any raised decks and porches should have guardrails on the edges.  Have any leaves, snow, or ice cleared regularly.  Use sand or salt on walking paths during winter.  Clean up any spills in your garage right away. This includes oil or grease spills. What can I do in the bathroom?  Use night lights.  Install grab bars by the toilet and in the tub and shower. Do not use towel bars as grab bars.  Use non-skid mats or decals in the tub or shower.  If you need to sit down in the shower, use a plastic, non-slip  stool.  Keep the floor dry. Clean up any water that spills on the floor as soon as it happens.  Remove soap buildup in the tub or shower regularly.  Attach bath mats securely with double-sided non-slip rug tape.  Do not have throw rugs and other things on the floor that can make you trip. What can I do in the bedroom?  Use night lights.  Make sure that you have a light by your bed that is easy to reach.  Do not use any sheets or blankets that are too big for your bed. They should not hang down onto the floor.  Have a firm chair that has side arms. You can use this for support while you get dressed.  Do not have throw rugs and other things on the floor that can make you trip. What can I do in the kitchen?  Clean up any spills right away.  Avoid walking on wet floors.  Keep items that you use a lot in easy-to-reach places.  If you need to reach something above you, use a strong step stool that has a grab bar.  Keep electrical cords out of the way.  Do not use floor polish or wax that makes floors slippery. If you must use wax, use non-skid floor wax.  Do not have throw rugs and other things on the floor that can make you trip. What can I do with my stairs?  Do not leave any items on the stairs.  Make sure that there are handrails on both sides of the stairs and use them. Fix handrails that are broken or loose. Make sure that handrails are as long as the stairways.  Check any carpeting to make sure that it is firmly attached to the stairs. Fix any carpet that is loose or worn.  Avoid having throw rugs at the top or bottom of the stairs. If you do have throw rugs, attach them to the floor with carpet tape.  Make sure that you have a light switch at the top of the stairs and the bottom of the stairs. If you do not have them, ask someone to add them for you. What else can I do to help prevent falls?  Wear shoes that:  Do not have high heels.  Have rubber bottoms.  Are  comfortable and fit you well.  Are closed at the toe. Do not wear sandals.  If you use a stepladder:  Make sure that it is fully opened. Do not climb a closed stepladder.  Make sure that both sides of the stepladder are locked into place.  Ask someone to hold it for you, if possible.  Clearly mark and make sure that you can see:  Any grab bars or handrails.  First and last steps.  Where the edge of each step is.  Use tools that help you move around (mobility aids) if they are needed. These include:  Canes.  Walkers.  Scooters.  Crutches.  Turn on the lights when you go into a dark area. Replace any light bulbs as soon as they burn out.  Set up your furniture so you have a clear path. Avoid moving your furniture around.  If any of your floors are uneven, fix them.  If there are any pets around you, be aware of where they are.  Review your medicines with your doctor. Some medicines can make you feel dizzy. This can increase your chance of falling. Ask your doctor what other things that you can do to help prevent falls. This information is not intended to replace advice given to you by your health care provider. Make sure you discuss any questions you have with your health care provider. Document Released: 06/04/2009 Document Revised: 01/14/2016 Document Reviewed: 09/12/2014 Elsevier Interactive Patient Education  2017 [employer].

## 2017-10-31 NOTE — Progress Notes (Signed)
Subjective:   Anthony [name] is a 67 y.o. male who presents for Medicare Annual/Subsequent preventive examination.  Last AWV-08/05/2016       Objective:    Vitals: BP 140/76 (BP Location: Left Arm, Patient Position: Sitting)   Pulse 68   Temp 97.7 F (36.5 C) (Oral)   Ht 6' (1.829 m)   Wt 254 lb (115.2 kg)   SpO2 97%   BMI 34.45 kg/m   Body mass index is 34.45 kg/m.  Advanced Directives 10/31/2017 05/04/2017 11/01/2016 08/05/2016 08/03/2016 01/27/2016 09/23/2015  Does Patient Have a Medical Advance Directive? No Yes Yes Yes Yes Yes Yes  Type of Advance Directive - [place];Living will [place];Living will Healthcare Power of [place] will Out of facility DNR (pink MOST or yellow form)  Does patient want to make changes to medical advance directive? - - - - - - No - Patient declined  Copy of [place] in Chart? - No - copy requested Yes Yes Yes Yes Yes  Would patient like information on creating a medical advance directive? Yes (MAU/Ambulatory/Procedural Areas - Information given) - - - - - -    Tobacco Social History   Tobacco Use  Smoking Status Never Smoker  Smokeless Tobacco Never Used     Counseling given: Not Answered   Clinical Intake:  Pre-visit preparation completed: No  Pain : 0-10 Pain Score: 5  Pain Type: Acute pain Pain Location: Neck Pain Orientation: Left Pain Descriptors / Indicators: Aching Pain Onset: More than a month ago Pain Frequency: Intermittent     Nutritional Risks: None Diabetes: No  How often do you need to have someone help you when you read instructions, pamphlets, or other written materials from your doctor or pharmacy?: 1 - Never What is the last grade level you completed in school?: 12th grade  Interpreter Needed?: No  Information entered by :: Tyson Dense, RN  Past Medical History:  Diagnosis Date  . Benign neoplasm of colon    . Benign neoplasm of skin of upper limb, including shoulder   . Carpal tunnel syndrome   . Chest pain    a. normal cors by cath in 2015  . Diaphragmatic hernia without mention of obstruction or gangrene   . Dyslipidemia   . Elevated prostate specific antigen (PSA)   . GERD (gastroesophageal reflux disease)   . Herpes zoster without mention of complication   . Hypertension   . Hypertension   . Hypertrophy of prostate with urinary obstruction and other lower urinary tract symptoms (LUTS)   . Impotence of organic origin   . Lumbago   . Obesity, unspecified   . Osteoarthrosis, unspecified whether generalized or localized, lower leg   . Other and unspecified hyperlipidemia   . Other malaise and fatigue   . Other seborrheic keratosis   . Special screening for malignant neoplasm of prostate   . Unspecified tinnitus    Past Surgical History:  Procedure Laterality Date  . CARDIOVASCULAR STRESS TEST  06/15/2012   Non-diagnostic for ischemia. No lexiscan EKG changes.  . CHOLECYSTECTOMY  1992  . EYE SURGERY    . eyeband  2006   removal of right eyeband  . INGUINAL HERNIA REPAIR  1984   left  . INTRAOCULAR LENS REMOVAL  2008   right  . LEFT HEART CATHETERIZATION WITH CORONARY ANGIOGRAM N/A 09/05/2013   Procedure: LEFT HEART CATHETERIZATION WITH CORONARY ANGIOGRAM;  Surgeon: Marcello Moores  Floyce Stakes, MD;  Location: [facility] CATH LAB;  Service: Cardiovascular;  Laterality: N/A;  . LOWER VENOUS EXTREMITY DOPPLER  01/26/2011   No evidence of thrombus or thrombophlebitis.  Marland Kitchen RETINAL DETACHMENT SURGERY  2001   right  . TOTAL HIP ARTHROPLASTY  2012   bilateral  . TOTAL KNEE ARTHROPLASTY  2011, 2012   bilateral  . TRANSTHORACIC ECHOCARDIOGRAM  10/05/2009   EF >55%, normal LV size, systolic, and diastolic function.   Family History  Problem Relation Age of Onset  . Diabetes Mother   . Arrhythmia Mother   . Hypertension Mother   . Dementia Father   . Pulmonary embolism Father   . Hypertension Father     . Cancer Sister        Liver cancer - Histocytic lymphoma  . Cancer Sister 78       Skin cancer  . Hypertension Sister 75  . Diabetes Sister   . Stroke Brother   . Diabetes Brother   . Hypertension Brother   . Cancer Daughter   . Cancer Maternal Grandmother        Skin cancer  . Heart attack Maternal Grandfather   . Cancer Paternal Grandfather   . Hypertension Paternal Grandfather    Social History   Socioeconomic History  . Marital status: Married    Spouse name: None  . Number of children: None  . Years of education: None  . Highest education level: None  Social Needs  . Financial resource strain: Not hard at all  . Food insecurity - worry: Never true  . Food insecurity - inability: Never true  . Transportation needs - medical: No  . Transportation needs - non-medical: No  Occupational History  . Occupation: retired Medical sales representative     Comment: Hospital doctor  Tobacco Use  . Smoking status: Never Smoker  . Smokeless tobacco: Never Used  Substance and Sexual Activity  . Alcohol use: No  . Drug use: No  . Sexual activity: Yes    Partners: Female    Comment: wife  Other Topics Concern  . None  Social History Narrative   Married   Never smoked   Alcohol none   Exercise -gym 3 days a week (join 3 months ago)   Living Will    Outpatient Encounter Medications as of 10/31/2017  Medication Sig  . amLODipine (NORVASC) 10 MG tablet Take 1 tablet (10 mg total) by mouth every morning.  Marland Kitchen aspirin EC 81 MG tablet Take 81 mg by mouth every morning.  . benazepril (LOTENSIN) 40 MG tablet TAKE 1 TABLET BY MOUTH EVERY DAY IN THE MORNING  . brimonidine (ALPHAGAN) 0.2 % ophthalmic solution Place 1 drop into the right eye 2 (two) times daily.  . dorzolamide-timolol (COSOPT) 22.3-6.8 MG/ML ophthalmic solution Place 1 drop into the right eye 2 (two) times daily.  Marland Kitchen esomeprazole (NEXIUM) 40 MG capsule Take 1 capsule (40 mg total) by mouth at bedtime. For reflux  . ezetimibe  (ZETIA) 10 MG tablet Take 1 tablet (10 mg total) by mouth every morning.  . famotidine (PEPCID) 20 MG tablet One at bedtime to reduce stomach acid  . finasteride (PROSCAR) 5 MG tablet TAKE 1 TABLET (5 MG TOTAL) BY MOUTH EVERY MORNING.  . hydrochlorothiazide (HYDRODIURIL) 25 MG tablet Take 1 tablet (25 mg total) by mouth every morning.  . nitroGLYCERIN (NITROSTAT) 0.4 MG SL tablet Place 1 tablet (0.4 mg total) under the tongue every 5 (five) minutes as needed for chest  pain.  . Zoster Vaccine Adjuvanted [facility]) injection Inject 0.5 mLs into the muscle once for 1 dose.  . [DISCONTINUED] Zoster Vaccine Adjuvanted [facility]) injection Inject 0.5 mLs into the muscle once.  . [DISCONTINUED] chlorpheniramine-HYDROcodone (TUSSIONEX PENNKINETIC ER) 10-8 MG/5ML SUER Take 5 mLs at bedtime as needed by mouth for cough.   No facility-administered encounter medications on file as of 10/31/2017.     Activities of Daily Living In your present state of health, do you have any difficulty performing the following activities: 10/31/2017  Hearing? N  Vision? Y  Difficulty concentrating or making decisions? N  Walking or climbing stairs? N  Dressing or bathing? N  Doing errands, shopping? N  Preparing Food and eating ? N  Using the Toilet? N  In the past six months, have you accidently leaked urine? N  Do you have problems with loss of bowel control? N  Managing your Medications? N  Managing your Finances? N  Housekeeping or managing your Housekeeping? N  Some recent data might be hidden    Patient Care Team: Gayland Curry, DO as PCP - General (Geriatric Medicine) Troy Sine, MD as PCP - Cardiology (Cardiology)   Assessment:   This is a routine wellness examination for [place].  Exercise Activities and Dietary recommendations Current Exercise Habits: Home exercise routine, Type of exercise: treadmill;strength training/weights, Time (Minutes): 60, Frequency (Times/Week): 2, Weekly Exercise  (Minutes/Week): 120, Intensity: Moderate, Exercise limited by: None identified  Goals    . Exercise 3x per week (30 min per time)     Starting 08/05/16, I will attempt to exercise 3 x per week.        Fall Risk Fall Risk  10/31/2017 05/04/2017 11/01/2016 08/05/2016 01/27/2016  Falls in the past year? No No No No No  Number falls in past yr: - - - - -  Injury with Fall? - - - - -   Is the patient's home free of loose throw rugs in walkways, pet beds, electrical cords, etc?   yes      Grab bars in the bathroom? no      Handrails on the stairs?   yes      Adequate lighting?   yes  Depression Screen PHQ 2/9 Scores 10/31/2017 05/04/2017 08/05/2016 01/27/2016  PHQ - 2 Score 0 0 0 0    Cognitive Function MMSE - Mini Mental State Exam 10/31/2017 08/05/2016  Orientation to time 4 4  Orientation to Place 5 5  Registration 3 3  Attention/ Calculation 5 3  Recall 1 3  Language- name 2 objects 2 2  Language- repeat 1 1  Language- follow 3 step command 3 3  Language- read & follow direction 1 1  Write a sentence 1 1  Copy design 1 1  Total score 27 27        Immunization History  Administered Date(s) Administered  . Influenza, High Dose Seasonal PF 05/04/2017  . Influenza,inj,Quad PF,6+ Mos 06/11/2013, 05/20/2015, 08/03/2016  . Pneumococcal Conjugate-13 09/23/2014  . Pneumococcal Polysaccharide-23 06/27/2003, 10/31/2017  . Tdap 08/30/2011    Qualifies for Shingles Vaccine? Yes, educated and prescription sent to pharmacy  Screening Tests Health Maintenance  Topic Date Due  . Hepatitis C Screening  08/22/2020 (Originally 1950-10-21)  . TETANUS/TDAP  08/29/2021  . COLONOSCOPY  05/08/2022  . INFLUENZA VACCINE  Completed  . PNA vac Low Risk Adult  Discontinued   Cancer Screenings: Lung: Low Dose CT Chest recommended if Age 19-80 years, 30 pack-year currently  smoking OR have quit w/in 15years. Patient does not qualify. Colorectal: due, pt will set up appointment  Additional  Screenings:  Hepatitis C Screening: declined    Plan:    I have personally reviewed and addressed the Medicare Annual Wellness questionnaire and have noted the following in the patient's chart:  A. Medical and social history B. Use of alcohol, tobacco or illicit drugs  C. Current medications and supplements D. Functional ability and status E.  Nutritional status F.  Physical activity G. Advance directives H. List of other physicians I.  Hospitalizations, surgeries, and ER visits in previous 12 months J.  [place] to include hearing, vision, cognitive, depression L. Referrals and appointments - none  In addition, I have reviewed and discussed with patient certain preventive protocols, quality metrics, and best practice recommendations. A written personalized care plan for preventive services as well as general preventive health recommendations were provided to patient.  See attached scanned questionnaire for additional information.   Signed,   Tyson Dense, RN Nurse Health Advisor  Patient Concerns: None

## 2017-11-01 LAB — COMPLETE METABOLIC PANEL WITH GFR
AG Ratio: 2.6 (calc) — ABNORMAL HIGH (ref 1.0–2.5)
ALT: 21 U/L (ref 9–46)
AST: 23 U/L (ref 10–35)
Albumin: 4.2 g/dL (ref 3.6–5.1)
Alkaline phosphatase (APISO): 63 U/L (ref 40–115)
BUN: 25 mg/dL (ref 7–25)
CO2: 30 mmol/L (ref 20–32)
Calcium: 9.3 mg/dL (ref 8.6–10.3)
Chloride: 107 mmol/L (ref 98–110)
Creat: 0.98 mg/dL (ref 0.70–1.25)
GFR, Est African American: 91 mL/min/{1.73_m2} (ref >=60)
GFR, Est Non African American: 79 mL/min/{1.73_m2} (ref >=60)
Globulin: 1.6 g/dL (calc) — ABNORMAL LOW (ref 1.9–3.7)
Glucose, Bld: 97 mg/dL (ref 65–99)
Potassium: 4 mmol/L (ref 3.5–5.3)
Sodium: 143 mmol/L (ref 135–146)
Total Bilirubin: 0.6 mg/dL (ref 0.2–1.2)
Total Protein: 5.8 g/dL — ABNORMAL LOW (ref 6.1–8.1)

## 2017-11-01 LAB — CBC WITH DIFFERENTIAL/PLATELET
Basophils Absolute: 83 cells/uL (ref 0–200)
Basophils Relative: 1.2 %
Eosinophils Absolute: 304 cells/uL (ref 15–500)
Eosinophils Relative: 4.4 %
HCT: 43.9 % (ref 38.5–50.0)
Hemoglobin: 15.2 g/dL (ref 13.2–17.1)
Lymphs Abs: 1615 cells/uL (ref 850–3900)
MCH: 30 pg (ref 27.0–33.0)
MCHC: 34.6 g/dL (ref 32.0–36.0)
MCV: 86.6 fL (ref 80.0–100.0)
MPV: 10.6 fL (ref 7.5–12.5)
Monocytes Relative: 10.3 %
Neutro Abs: 4188 cells/uL (ref 1500–7800)
Neutrophils Relative %: 60.7 %
Platelets: 195 10*3/uL (ref 140–400)
RBC: 5.07 10*6/uL (ref 4.20–5.80)
RDW: 13 % (ref 11.0–15.0)
Total Lymphocyte: 23.4 %
WBC mixed population: 711 cells/uL (ref 200–950)
WBC: 6.9 10*3/uL (ref 3.8–10.8)

## 2017-11-01 LAB — HEMOGLOBIN A1C
Hgb A1c MFr Bld: 5.3 % of total Hgb (ref <5.7)
Mean Plasma Glucose: 105 (calc)
eAG (mmol/L): 5.8 (calc)

## 2017-11-01 LAB — LIPID PANEL
Cholesterol: 171 mg/dL (ref <200)
HDL: 41 mg/dL (ref >40)
LDL Cholesterol (Calc): 107 mg/dL (calc) — ABNORMAL HIGH
Non-HDL Cholesterol (Calc): 130 mg/dL (calc) — ABNORMAL HIGH (ref <130)
Total CHOL/HDL Ratio: 4.2 (calc) (ref <5.0)
Triglycerides: 115 mg/dL (ref <150)

## 2017-11-01 LAB — PSA: PSA: 2.2 ng/mL (ref <=4.0)

## 2017-11-02 ENCOUNTER — Encounter: Payer: Self-pay | Admitting: Internal Medicine

## 2017-11-02 ENCOUNTER — Ambulatory Visit (INDEPENDENT_AMBULATORY_CARE_PROVIDER_SITE_OTHER): Payer: PPO | Admitting: Internal Medicine

## 2017-11-02 VITALS — BP 132/70 | HR 75 | Temp 98.2°F | Wt 252.0 lb

## 2017-11-02 DIAGNOSIS — R351 Nocturia: Secondary | ICD-10-CM

## 2017-11-02 DIAGNOSIS — R252 Cramp and spasm: Secondary | ICD-10-CM | POA: Diagnosis not present

## 2017-11-02 DIAGNOSIS — I10 Essential (primary) hypertension: Secondary | ICD-10-CM

## 2017-11-02 DIAGNOSIS — E669 Obesity, unspecified: Secondary | ICD-10-CM | POA: Diagnosis not present

## 2017-11-02 DIAGNOSIS — R739 Hyperglycemia, unspecified: Secondary | ICD-10-CM | POA: Diagnosis not present

## 2017-11-02 DIAGNOSIS — M5412 Radiculopathy, cervical region: Secondary | ICD-10-CM | POA: Diagnosis not present

## 2017-11-02 DIAGNOSIS — E782 Mixed hyperlipidemia: Secondary | ICD-10-CM

## 2017-11-02 DIAGNOSIS — Z6836 Body mass index (BMI) 36.0-36.9, adult: Secondary | ICD-10-CM | POA: Diagnosis not present

## 2017-11-02 DIAGNOSIS — K222 Esophageal obstruction: Secondary | ICD-10-CM | POA: Diagnosis not present

## 2017-11-02 DIAGNOSIS — N401 Enlarged prostate with lower urinary tract symptoms: Secondary | ICD-10-CM | POA: Diagnosis not present

## 2017-11-02 NOTE — Progress Notes (Signed)
Location:  [facility] clinic Provider:  Aryiana Klinkner L. Mariea Clonts, D.O., C.M.D.  Code Status: full code Goals of Care:  Advanced Directives 10/31/2017  Does Patient Have a Medical Advance Directive? No  Type of Advance Directive -  Does patient want to make changes to medical advance directive? -  Copy of [place] in Chart? -  Would patient like information on creating a medical advance directive? Yes (MAU/Ambulatory/Procedural Areas - Information given)   Chief Complaint  Patient presents with  . Medical Management of Chronic Issues    71mth follow-up    HPI: Patient is a 67 y.o. male seen today for medical management of chronic diseases.    Has lost a few lbs.  Has not gotten back into going to the gym like he used to before family moved in with him. He cut out a lot of sweets.  LDL and TG much improved.  Hasn't had a coca cola in 6 mos.  Sugar average has continued to trend down.  Used to drink 2-3 cokes per day and that was his what he did when he felt bad.    Everything going pretty well.    Left ear bothers him some.  It aches a little bit.  Has rung a little for a long time and saw ENT before for the tinnitus.  Not blocked or congested.  Hasn't needed his ears flushed since he stopped working.    He's going to see Dr. Trenton Gammon for his neck.  Several years ago, ortho told him he might need a neck fusion for bad arthritis.    He gets cramps in his legs every so often.  Tues night, had one in his left inside and it's still sore now.  If he lifts it off the floor while in sitting position, it pulls.  Says he's always had cramps.  Typically they involve the back of his thigh or his calf not the medial thigh.    No changes in urinary symptoms recently.  PSA just slightly upward trending vs 4 years ago.  Reviewed hep c risk factors:  Pt does not have them.  BP medication was increased by Dr. Claiborne Billings to get him 130 or lower.  Reports he previously had weakness and dizziness when he  was on 10mg  amlodipine, but he's tolerating better now.  Feels like he's sleepier but this may be increasing activity.    He's still debating getting his esophagus stretched due to his esophageal stenosis.  He's afraid of risk of ruptured.  Past Medical History:  Diagnosis Date  . Benign neoplasm of colon   . Benign neoplasm of skin of upper limb, including shoulder   . Carpal tunnel syndrome   . Chest pain    a. normal cors by cath in 2015  . Diaphragmatic hernia without mention of obstruction or gangrene   . Dyslipidemia   . Elevated prostate specific antigen (PSA)   . GERD (gastroesophageal reflux disease)   . Herpes zoster without mention of complication   . Hypertension   . Hypertension   . Hypertrophy of prostate with urinary obstruction and other lower urinary tract symptoms (LUTS)   . Impotence of organic origin   . Lumbago   . Obesity, unspecified   . Osteoarthrosis, unspecified whether generalized or localized, lower leg   . Other and unspecified hyperlipidemia   . Other malaise and fatigue   . Other seborrheic keratosis   . Special screening for malignant neoplasm of prostate   .  Unspecified tinnitus     Past Surgical History:  Procedure Laterality Date  . CARDIOVASCULAR STRESS TEST  06/15/2012   Non-diagnostic for ischemia. No lexiscan EKG changes.  . CHOLECYSTECTOMY  1992  . EYE SURGERY    . eyeband  2006   removal of right eyeband  . INGUINAL HERNIA REPAIR  1984   left  . INTRAOCULAR LENS REMOVAL  2008   right  . LEFT HEART CATHETERIZATION WITH CORONARY ANGIOGRAM N/A 09/05/2013   Procedure: LEFT HEART CATHETERIZATION WITH CORONARY ANGIOGRAM;  Surgeon: Troy Sine, MD;  Location: [facility] CATH LAB;  Service: Cardiovascular;  Laterality: N/A;  . LOWER VENOUS EXTREMITY DOPPLER  01/26/2011   No evidence of thrombus or thrombophlebitis.  Marland Kitchen RETINAL DETACHMENT SURGERY  2001   right  . TOTAL HIP ARTHROPLASTY  2012   bilateral  . TOTAL KNEE ARTHROPLASTY  2011, 2012    bilateral  . TRANSTHORACIC ECHOCARDIOGRAM  10/05/2009   EF >55%, normal LV size, systolic, and diastolic function.    Allergies  Allergen Reactions  . Codeine Other (See Comments)    Headache  . Lipitor [Atorvastatin] Other (See Comments)    Leg pain/cramps  . Red Yeast Rice [Cholestin] Other (See Comments)    Makes legs hurt  . Statins Other (See Comments)    "muscle break down"  . Latex Rash    Outpatient Encounter Medications as of 11/02/2017  Medication Sig  . amLODipine (NORVASC) 10 MG tablet Take 1 tablet (10 mg total) by mouth every morning.  Marland Kitchen aspirin EC 81 MG tablet Take 81 mg by mouth every morning.  . benazepril (LOTENSIN) 40 MG tablet TAKE 1 TABLET BY MOUTH EVERY DAY IN THE MORNING  . brimonidine (ALPHAGAN) 0.2 % ophthalmic solution Place 1 drop into the right eye 2 (two) times daily.  . dorzolamide-timolol (COSOPT) 22.3-6.8 MG/ML ophthalmic solution Place 1 drop into the right eye 2 (two) times daily.  Marland Kitchen esomeprazole (NEXIUM) 40 MG capsule Take 1 capsule (40 mg total) by mouth at bedtime. For reflux  . ezetimibe (ZETIA) 10 MG tablet Take 1 tablet (10 mg total) by mouth every morning.  . famotidine (PEPCID) 20 MG tablet One at bedtime to reduce stomach acid  . finasteride (PROSCAR) 5 MG tablet TAKE 1 TABLET (5 MG TOTAL) BY MOUTH EVERY MORNING.  . hydrochlorothiazide (HYDRODIURIL) 25 MG tablet Take 1 tablet (25 mg total) by mouth every morning.  . nitroGLYCERIN (NITROSTAT) 0.4 MG SL tablet Place 1 tablet (0.4 mg total) under the tongue every 5 (five) minutes as needed for chest pain.   No facility-administered encounter medications on file as of 11/02/2017.     Review of Systems:  Review of Systems  Constitutional: Negative for chills, fever and malaise/fatigue.       Falling asleep more easily, more active lately  HENT: Positive for ear pain and tinnitus. Negative for congestion, hearing loss, sinus pain and sore throat.        Left  Eyes: Negative for blurred  vision.  Respiratory: Negative for cough and shortness of breath.   Cardiovascular: Negative for chest pain, palpitations and leg swelling.  Gastrointestinal: Negative for abdominal pain, blood in stool, constipation, diarrhea and melena.  Genitourinary: Negative for dysuria.  Musculoskeletal: Positive for myalgias. Negative for falls.       Left thigh  Skin: Negative for itching and rash.  Neurological: Negative for dizziness, loss of consciousness and weakness.  Endo/Heme/Allergies: Does not bruise/bleed easily.  Psychiatric/Behavioral: Negative for depression and memory  loss. The patient is not nervous/anxious and does not have insomnia.     Health Maintenance  Topic Date Due  . Hepatitis C Screening  08/22/2020 (Originally 1950/09/24)  . TETANUS/TDAP  08/29/2021  . COLONOSCOPY  05/08/2022  . INFLUENZA VACCINE  Completed  . PNA vac Low Risk Adult  Discontinued    Physical Exam: Vitals:   11/02/17 0731  BP: 132/70  Pulse: 75  Temp: 98.2 F (36.8 C)  TempSrc: Oral  SpO2: 97%  Weight: 252 lb (114.3 kg)   Body mass index is 34.18 kg/m. Physical Exam  Constitutional: He is oriented to person, place, and time. He appears well-developed and well-nourished. No distress.  HENT:  Head: Normocephalic and atraumatic.  Right Ear: External ear normal.  Left Ear: External ear normal.  Both TMs pink and canals appear normal, normal light reflex  Cardiovascular: Normal rate, regular rhythm, normal heart sounds and intact distal pulses.  Prominent veins with bulging area on left forearm  Pulmonary/Chest: Effort normal and breath sounds normal. No respiratory distress.  Abdominal: Bowel sounds are normal.  Musculoskeletal: Normal range of motion. He exhibits tenderness.  Left medial thigh tenderness, but no palpable abnormality  Neurological: He is alert and oriented to person, place, and time.  Skin: Skin is warm and dry. Capillary refill takes less than 2 seconds.  Psychiatric: He  has a normal mood and affect.     Labs reviewed: Basic Metabolic Panel: Recent Labs    05/01/17 0827 10/31/17 0815  NA 141 143  K 3.6 4.0  CL 106 107  CO2 29 30  GLUCOSE 106* 97  BUN 12 25  CREATININE 0.93 0.98  CALCIUM 9.3 9.3   Liver Function Tests: Recent Labs    05/01/17 0827 10/31/17 0815  AST 17 23  ALT 23 21  BILITOT 0.5 0.6  PROT 5.6* 5.8*   No results for input(s): LIPASE, AMYLASE in the last 8760 hours. No results for input(s): AMMONIA in the last 8760 hours. CBC: Recent Labs    10/31/17 0815  WBC 6.9  NEUTROABS 4,188  HGB 15.2  HCT 43.9  MCV 86.6  PLT 195   Lipid Panel: Recent Labs    05/01/17 0827 10/31/17 0815  CHOL 186 171  HDL 39* 41  LDLCALC 115* 107*  TRIG 200* 115  CHOLHDL 4.8 4.2   Lab Results  Component Value Date   HGBA1C 5.3 10/31/2017   Assessment/Plan 1. Essential hypertension -bp satisfactory--very near goal of 130 or less that Dr. Claiborne Billings has recommended for him -he says he's sleepier if he sits down, but more active lately also  -not dizzy or weak like he was in the past with the amlodipine at higher doses -cont same regimen and increase exercise to keep this down  2. Esophageal stenosis -ongoing, but he is afraid to get dilation--still considering -cont pepcid  3. Thigh cramp -left medial thigh -getting better gradually but started at night 2 days ago--did not rest it yesterday so advised to try to rest it a bit today for healing since ache persists  4. Obesity (BMI 30-39.9) -ongoing, but improved -cont dietary changes and also try to cut down on beef some also--discussed [place] burgers in place of beef -cont off cokes and sweets -hydrate well -increase exercise--return to gym or walk outside - Hemoglobin A1c; Future - Lipid panel; Future  5. Mixed hyperlipidemia -cont zetia therapy, has improved with dietary changes, increase exercise - Lipid panel; Future  6. Benign prostatic hyperplasia with  nocturia -stable symptoms and PSA still well under 4, cont proscar/finasteride  7. Hyperglycemia - sugar has trended down into normal range minus cokes and sweets -cont dietary changes (get back of some of the white bread) and increase exercise now - Hemoglobin A1c; Future  Goal weight is under 200.  Labs/tests ordered:   Orders Placed This Encounter  Procedures  . Hemoglobin A1c    Standing Status:   Future    Standing Expiration Date:   07/05/2018  . Lipid panel    Standing Status:   Future    Standing Expiration Date:   07/05/2018    Next appt:  03/01/2018 f/u on weight  Tifanie Gardiner L. Nadira Single, D.O. [facility] [address], [place] [address] Cell Phone (Mon-Fri 8am-5pm):  [phone] On Call:  [phone] & follow prompts after 5pm & weekends Office Phone:  [phone] Office Fax:  [phone]

## 2017-11-02 NOTE — Patient Instructions (Signed)
Decrease your red meat intake.  For example, try [place] burgers instead of beef burgers.   Continue to avoid cokes.   You've done great with your diet improvements so far.  Your sugar and cholesterol are both much better.  Keep up the good work! Goal weight:  200 lbs. Also, get back to the gym or regular walking outside to help with weight loss.

## 2017-11-13 ENCOUNTER — Ambulatory Visit: Payer: PPO | Admitting: Internal Medicine

## 2017-11-13 ENCOUNTER — Other Ambulatory Visit: Payer: Self-pay | Admitting: Internal Medicine

## 2017-11-14 DIAGNOSIS — M542 Cervicalgia: Secondary | ICD-10-CM | POA: Diagnosis not present

## 2017-11-14 DIAGNOSIS — M5412 Radiculopathy, cervical region: Secondary | ICD-10-CM | POA: Diagnosis not present

## 2017-11-22 ENCOUNTER — Other Ambulatory Visit: Payer: Self-pay | Admitting: Neurosurgery

## 2017-11-22 DIAGNOSIS — M5412 Radiculopathy, cervical region: Secondary | ICD-10-CM | POA: Diagnosis not present

## 2017-11-23 ENCOUNTER — Telehealth: Payer: Self-pay

## 2017-11-23 NOTE — Telephone Encounter (Unsigned)
This encounter was created in error - please disregard.

## 2017-11-23 NOTE — Telephone Encounter (Signed)
   [facility] Pre-operative Risk Assessment    Request for surgical clearance:  1. What type of surgery is being performed? C4-C5; C5-C6 Anterior Cervical Fusion    2. When is this surgery scheduled? 12/12/17   3. What type of clearance is required (medical clearance vs. Pharmacy clearance to hold med vs. Both)? Medical clearance  4. Are there any medications that need to be held prior to surgery and how long?none   5. Practice name and name of physician performing surgery? Dr. Earnie Larsson with [facility]   6. What is your office phone and fax number? Phone: [phone]; Fax [phone]    7. Anesthesia type (None, local, MAC, general) ? general   Anthony [name] 11/23/2017, 9:53 AM  _________________________________________________________________   (provider comments below)

## 2017-11-24 NOTE — Telephone Encounter (Addendum)
   Cardiac clearance requested for neck surgery.  He has an appointment with Dr. Claiborne Billings on 11/27/2017.  Dr. Claiborne Billings to address cardiac clearance at that appointment.   [name], PA-C 11/24/2017 5:13 PM Bee[place] [phone]

## 2017-11-27 ENCOUNTER — Encounter: Payer: Self-pay | Admitting: Cardiovascular Disease

## 2017-11-27 ENCOUNTER — Ambulatory Visit: Payer: PPO | Admitting: Cardiovascular Disease

## 2017-11-27 VITALS — BP 142/70 | HR 66 | Ht 70.0 in | Wt 253.2 lb

## 2017-11-27 DIAGNOSIS — E669 Obesity, unspecified: Secondary | ICD-10-CM

## 2017-11-27 DIAGNOSIS — E66811 Obesity, class 1: Secondary | ICD-10-CM

## 2017-11-27 DIAGNOSIS — Z01818 Encounter for other preprocedural examination: Secondary | ICD-10-CM

## 2017-11-27 DIAGNOSIS — K219 Gastro-esophageal reflux disease without esophagitis: Secondary | ICD-10-CM | POA: Diagnosis not present

## 2017-11-27 DIAGNOSIS — I10 Essential (primary) hypertension: Secondary | ICD-10-CM | POA: Diagnosis not present

## 2017-11-27 DIAGNOSIS — E7800 Pure hypercholesterolemia, unspecified: Secondary | ICD-10-CM | POA: Diagnosis not present

## 2017-11-27 NOTE — Progress Notes (Signed)
Patient ID: Anthony Hanley., male   DOB: 29-Jun-1951, 67 y.o.   MRN: [id number]     HPI: Anthony [name]. is a 67 y.o. male who presents to the office for a 3 month  follow-up cardiology evaluation.   Anthony [name] has a history of hypertension, mild obesity, as well as hyperlipidemia. In the past, he did develop CPK elevation secondary to statin therapy. He has been able to tolerate Zetia. A nuclear perfusion study done in October 2013 for chest pain showed normal perfusion without scar or ischemia.   Laboratory in October 2014 showed cholesterol 196, triglycerides significantly elevated at 291, HDL of 34, VLDL increased at 58, and  LDL 106. Renal function was normal. BUN 15 kerning 0.79.   He developed recurrent episodes of chest pain for which he saw Anthony [name] and because of exertionally precipitated episodes of discomfort definitive cardiac catheterization was recommended. This was done by me on 09/05/2013 and revealed normal coronary arteries with normal LV function without focal segmental wall motion abnormalities. Ejection fraction was 55%.  He underwent an echo Doppler study on 09/04/2013 which showed an ejection fraction of 55-60%. He did have grade 1 diastolic dysfunction and mild left ventricular hypertrophy. He had mild mitral annular calcification with mildly thickened leaflets and trivial mitral regurgitation. A moderate pressure estimate was 27 mm.  He was  seen by Anthony [name] on 08/17/2016 with complaints of chest discomfort.  He's episodes would occur often when he was walking on his driveway her in the grocery store and had been ongoing for 1-2 months.  He had diffuse tenderness to palpation along his left pectoral region.  On exam it was felt that some of his chest pain was musculoskeletal in etiology.  He was hypertensive and she further titrated amlodipine.  Because of exertional dyspnea.  She referred him for an echo Doppler study which was done on 08/23/2016.  This showed  mild LVH with normal systolic function and grade 1 diastolic dysfunction.  There is very mild increased peak PA pressure 34 mm.  I last saw him in January 2019 after not having seen him since February 2015.  At that time, his pressure was elevated despite taking amlodipine 7.5 mg.  I recommended further titration to 10 mg daily.  On this increased dose, he states his blood pressure at home typically has been running in the 130-135 range.  He denies chest pain PND orthopnea.  He has a history of GERD which is controlled with Nexium.  He has had some issues with left arm paresthesias and was diagnosed with significant cervical degenerative disc disease.  He is scheduled to undergo neck surgery on December 12, 2017 by Anthony [name].  Preoperative surgical clearance was recommended.  He denies any chest pain or shortness of breath.  He has not been exercising as he had in the past in the winter months but hopes to do so following his surgery and with improved weather.  He presents for follow-up evaluation.   Past Medical History:  Diagnosis Date  . Benign neoplasm of colon   . Benign neoplasm of skin of upper limb, including shoulder   . Carpal tunnel syndrome   . Chest pain    a. normal cors by cath in 2015  . Diaphragmatic hernia without mention of obstruction or gangrene   . Dyslipidemia   . Elevated prostate specific antigen (PSA)   . GERD (gastroesophageal reflux disease)   . Herpes zoster  without mention of complication   . Hypertension   . Hypertension   . Hypertrophy of prostate with urinary obstruction and other lower urinary tract symptoms (LUTS)   . Impotence of organic origin   . Lumbago   . Obesity, unspecified   . Osteoarthrosis, unspecified whether generalized or localized, lower leg   . Other and unspecified hyperlipidemia   . Other malaise and fatigue   . Other seborrheic keratosis   . Special screening for malignant neoplasm of prostate   . Unspecified  tinnitus     Past Surgical History:  Procedure Laterality Date  . CARDIOVASCULAR STRESS TEST  06/15/2012   Non-diagnostic for ischemia. No lexiscan EKG changes.  . CHOLECYSTECTOMY  1992  . EYE SURGERY    . eyeband  2006   removal of right eyeband  . INGUINAL HERNIA REPAIR  1984   left  . INTRAOCULAR LENS REMOVAL  2008   right  . LEFT HEART CATHETERIZATION WITH CORONARY ANGIOGRAM N/A 09/05/2013   Procedure: LEFT HEART CATHETERIZATION WITH CORONARY ANGIOGRAM;  Surgeon: Troy Sine, MD;  Location: [facility] CATH LAB;  Service: Cardiovascular;  Laterality: N/A;  . LOWER VENOUS EXTREMITY DOPPLER  01/26/2011   No evidence of thrombus or thrombophlebitis.  Marland Kitchen RETINAL DETACHMENT SURGERY  2001   right  . TOTAL HIP ARTHROPLASTY  2012   bilateral  . TOTAL KNEE ARTHROPLASTY  2011, 2012   bilateral  . TRANSTHORACIC ECHOCARDIOGRAM  10/05/2009   EF >55%, normal LV size, systolic, and diastolic function.    Allergies  Allergen Reactions  . Codeine Other (See Comments)    Headache  . Lipitor [Atorvastatin] Other (See Comments)    Leg pain/cramps  . Red Yeast Rice [Cholestin] Other (See Comments)    Makes legs hurt  . Statins Other (See Comments)    "muscle break down"  . Latex Rash    Current Outpatient Medications  Medication Sig Dispense Refill  . amLODipine (NORVASC) 10 MG tablet Take 1 tablet (10 mg total) by mouth every morning. 90 tablet 3  . aspirin EC 81 MG tablet Take 81 mg by mouth every morning.    . benazepril (LOTENSIN) 40 MG tablet TAKE 1 TABLET BY MOUTH EVERY DAY IN THE MORNING 90 tablet 1  . brimonidine (ALPHAGAN P) 0.1 % SOLN Place 1 drop into the right eye 2 (two) times daily.    . dorzolamide-timolol (COSOPT) 22.3-6.8 MG/ML ophthalmic solution Place 2 drops into the right eye 2 (two) times daily.     Marland Kitchen esomeprazole (NEXIUM) 40 MG capsule TAKE 1 CAPSULE (40 MG TOTAL) BY MOUTH AT BEDTIME. FOR REFLUX 90 capsule 1  . ezetimibe (ZETIA) 10 MG tablet Take 1 tablet (10 mg total) by  mouth every morning. 90 tablet 3  . famotidine (PEPCID) 20 MG tablet One at bedtime to reduce stomach acid (Patient taking differently: Take 20 mg by mouth at bedtime. ) 30 tablet 5  . finasteride (PROSCAR) 5 MG tablet TAKE 1 TABLET (5 MG TOTAL) BY MOUTH EVERY MORNING. 90 tablet 1  . hydrochlorothiazide (HYDRODIURIL) 25 MG tablet Take 1 tablet (25 mg total) by mouth every morning. 90 tablet 3  . loratadine (CLARITIN) 10 MG tablet Take 10 mg by mouth daily.    . naproxen sodium (ALEVE) 220 MG tablet Take 440 mg by mouth daily as needed (pain).    . nitroGLYCERIN (NITROSTAT) 0.4 MG SL tablet Place 1 tablet (0.4 mg total) under the tongue every 5 (five) minutes as needed for chest  pain. 25 tablet 2   No current facility-administered medications for this visit.     Social History   Socioeconomic History  . Marital status: Married    Spouse name: Not on file  . Number of children: Not on file  . Years of education: Not on file  . Highest education level: Not on file  Occupational History  . Occupation: retired Medical sales representative     Comment: Hospital doctor  Social Needs  . Financial resource strain: Not hard at all  . Food insecurity:    Worry: Never true    Inability: Never true  . Transportation needs:    Medical: No    Non-medical: No  Tobacco Use  . Smoking status: Never Smoker  . Smokeless tobacco: Never Used  Substance and Sexual Activity  . Alcohol use: No  . Drug use: No  . Sexual activity: Yes    Partners: Female    Comment: wife  Lifestyle  . Physical activity:    Days per week: 2 days    Minutes per session: 60 min  . Stress: Only a little  Relationships  . Social connections:    Talks on phone: Twice a week    Gets together: Twice a week    Attends religious service: More than 4 times per year    Active member of club or organization: No    Attends meetings of clubs or organizations: Never    Relationship status: Married  . Intimate partner violence:    Fear of  current or ex partner: No    Emotionally abused: No    Physically abused: No    Forced sexual activity: No  Other Topics Concern  . Not on file  Social History Narrative   Married   Never smoked   Alcohol none   Exercise -gym 3 days a week (join 3 months ago)   Living Will   Social is notable that he is married has 2 children and 2 grandchildren. He is not routinely exercise. There is no tobacco use. He does drink occasional alcohol.  Family History  Problem Relation Age of Onset  . Diabetes Mother   . Arrhythmia Mother   . Hypertension Mother   . Dementia Father   . Pulmonary embolism Father   . Hypertension Father   . Cancer Sister        Liver cancer - Histocytic lymphoma  . Cancer Sister 46       Skin cancer  . Hypertension Sister 58  . Diabetes Sister   . Stroke Brother   . Diabetes Brother   . Hypertension Brother   . Cancer Daughter   . Cancer Maternal Grandmother        Skin cancer  . Heart attack Maternal Grandfather   . Cancer Paternal Grandfather   . Hypertension Paternal Grandfather     ROS General: Negative; No fevers, chills, or night sweats;  positive for a 16 pound weight loss in September 2018 HEENT: Negative; No changes in vision or hearing, sinus congestion, difficulty swallowing Pulmonary: Negative; No cough, wheezing, shortness of breath, hemoptysis Cardiovascular: Positive for occasional exertional shortness of breath.  No exertional chest pain. GI: Positive for GERD  GU: Negative; No dysuria, hematuria, or difficulty voiding Musculoskeletal: Negative; no myalgias, joint pain, or weakness Hematologic/Oncology: Negative; no easy bruising, bleeding Endocrine: Negative; no heat/cold intolerance; no diabetes Neuro: Mild left arm paresthesias from his cervical degenerative disease Skin: Negative; No rashes or skin lesions Psychiatric: Negative;  No behavioral problems, depression Sleep: Negative; No snoring, daytime sleepiness, hypersomnolence,  bruxism, restless legs, hypnogognic hallucinations, no cataplexy Other comprehensive 14 point system review is negative.   PE BP (!) 142/70   Pulse 66   Ht '5\' 10"'  (1.778 m)   Wt 253 lb 3.2 oz (114.9 kg)   BMI 36.33 kg/m    Repeat blood pressure by me was 132/82  Wt Readings from Last 3 Encounters:  11/27/17 253 lb 3.2 oz (114.9 kg)  11/02/17 252 lb (114.3 kg)  10/31/17 254 lb (115.2 kg)   General: Alert, oriented, no distress.  Skin: normal turgor, no rashes, warm and dry HEENT: Normocephalic, atraumatic. Pupils equal round and reactive to light; sclera anicteric; extraocular muscles intact; Nose without nasal septal hypertrophy Mouth/Parynx benign; Mallinpatti scale 3 Neck: No JVD, no carotid bruits; normal carotid upstroke Lungs: clear to ausculatation and percussion; no wheezing or rales Chest wall: without tenderness to palpitation Heart: PMI not displaced, RRR, s1 s2 normal, 1/6 systolic murmur, no diastolic murmur, no rubs, gallops, thrills, or heaves Abdomen: Mild central adiposity ;soft, nontender; no hepatosplenomehaly, BS+; abdominal aorta nontender and not dilated by palpation. Back: no CVA tenderness Pulses 2+ Musculoskeletal: full range of motion, normal strength, no joint deformities Extremities: no clubbing cyanosis or edema, Homan's sign negative  Neurologic: grossly nonfocal; Cranial nerves grossly wnl Psychologic: Normal mood and affect   ECG (independently read by me): Normal sinus rhythm at 66 bpm.  Normal intervals.  No ectopy.  No ST segment changes.  August 28, 2017 ECG (independently read by me): Normal sinus rhythm at 63 bpm.  Normal intervals.  No ST segment changes.  February 2015 ECG (independently read by me): Normal sinus rhythm at 66 beats per minute. PR interval 160 ms. QTC intervals 34 ms. No significant ST-T changes.  Prior ECG from November 2014 :Normal sinus rhythm at 69 beats per minute. No ectopy. Normal intervals.  LABS:  BMP  Latest Ref Rng & Units 10/31/2017 05/01/2017 10/27/2016  Glucose 65 - 99 mg/dL 97 106(H) 96  BUN 7 - 25 mg/dL '25 12 19  ' Creatinine 0.70 - 1.25 mg/dL 0.98 0.93 1.08  BUN/Creat Ratio 6 - 22 (calc) NOT APPLICABLE NOT APPLICABLE -  Sodium 622 - 146 mmol/L 143 141 142  Potassium 3.5 - 5.3 mmol/L 4.0 3.6 4.0  Chloride 98 - 110 mmol/L 107 106 106  CO2 20 - 32 mmol/L '30 29 25  ' Calcium 8.6 - 10.3 mg/dL 9.3 9.3 9.1   CBC Latest Ref Rng & Units 10/31/2017 11/03/2013 11/03/2013  WBC 3.8 - 10.8 Thousand/uL 6.9 - 15.3(H)  Hemoglobin 13.2 - 17.1 g/dL 15.2 13.9 14.0  Hematocrit 38.5 - 50.0 % 43.9 41.0 40.1  Platelets 140 - 400 Thousand/uL 195 - 194   No results found for: TSH  Lab Results  Component Value Date   HGBA1C 5.3 10/31/2017   Hepatic Function Latest Ref Rng & Units 10/31/2017 05/01/2017 10/27/2016  Total Protein 6.1 - 8.1 g/dL 5.8(L) 5.6(L) 5.9(L)  Albumin 3.6 - 5.1 g/dL - - 4.1  AST 10 - 35 U/L '23 17 17  ' ALT 9 - 46 U/L '21 23 24  ' Alk Phosphatase 40 - 115 U/L - - 71  Total Bilirubin 0.2 - 1.2 mg/dL 0.6 0.5 0.6   Lipid Panel     Component Value Date/Time   CHOL 171 10/31/2017 0815   CHOL 175 01/25/2016 0825   CHOL 191 07/22/2013 0835   TRIG 115 10/31/2017 0815   TRIG 253 (  H) 07/22/2013 0835   HDL 41 10/31/2017 0815   HDL 46 01/25/2016 0825   HDL 38 (L) 07/22/2013 0835   CHOLHDL 4.2 10/31/2017 0815   VLDL 35 (H) 10/27/2016 0825   LDLCALC 107 (H) 10/31/2017 0815   LDLCALC 102 (H) 07/22/2013 2778    RADIOLOGY: No results found.  IMPRESSION:  1. Essential hypertension   2. Pure hypercholesterolemia   3. Obesity (BMI 30.0-34.9)   4. Gastroesophageal reflux disease without esophagitis   5. Preoperative clearance     ASSESSMENT AND PLAN: Mr. Noon is a 67 year old gentleman who has a history of moderate obesity, hyperlipidemia, hypertension, and GERD.  He had developed some chest discomfort 2015 which ultimately led to definitive cardiac catheterization.  This revealed normal  coronary arteries.  He has a history of obesity.  Had lost 16 pounds since September 2018 and his weight today is unchanged from January 2 153 pounds.  He is moderately obese with a BMI of 36.33.  His blood pressure today on repeat by me was improved to over 78 on his increased amlodipine regimen.  Tory.  He has a history of statin intolerance.  Total cholesterol was 171 with HDL 41 triglycerides 115 and his LDL was improved at 107.  He has normal renal function.  Hemoglobin A1c was excellent at 5.3.  His ECG entirely normal.  I am giving him preoperative clearance for his planned cervical surgery be done by Anthony [name] on December 12, 2017.  I made a copy of the ECG taken in the office today so this can be used for his presurgical screening and should not need to be repeated.  His most recent echo Doppler study 1 year ago showed normal systolic function with an EF of 55-60% with mild grade 1 diastolic dysfunction without wall motion abnormalities.  He will return to his primary physician.  GERD is controlled with Nexium.  I will see him in 1 year for follow-up cardiology evaluation.   Time spent: 25 minutes Troy Sine, MD, [facility]  11/27/2017 8:34 AM

## 2017-11-27 NOTE — Patient Instructions (Signed)

## 2017-11-27 NOTE — Telephone Encounter (Signed)
    Primary Cardiologist: Shelva Majestic, MD  Chart reviewed as part of pre-operative protocol coverage.  Anthony Bern. was seen by Dr Claiborne Billings on 11/27/2017. He would be at acceptable risk for the planned procedure without further cardiovascular testing.   I will route this recommendation to the requesting party via Epic fax function and remove from pre-op pool.  Please call with questions.  [name], PA-C 11/27/2017, 10:11 AM

## 2017-11-27 NOTE — Telephone Encounter (Signed)
Patient was seated follow-up November 27, 2016 in the office.  Cardiac clearance is given for his planned upcoming cervical surgery.

## 2017-11-28 NOTE — Pre-Procedure Instructions (Signed)
[place]  11/28/2017      [id number] [address], [place] [address] [place] [address] Phone: [phone] Fax: [phone]    Your procedure is scheduled on December 12, 2017.  Report to [facility] Admitting at 6:00 AM.  Call this number if you have problems the morning of surgery:  [phone]   Remember:  Do not eat food or drink liquids after midnight.  Take these medicines the morning of surgery with A SIP OF WATER amlodipine (norvasc) Brimonidine (alphagan) eye drops cosopt eye drops Esomeprazole (nexium)  Follow your doctor's instructions regarding your aspirin  7 days prior to surgery STOP taking any Aleve, Naproxen, Ibuprofen, Motrin, Advil, Goody's, BC's, all herbal medications, fish oil, and all vitamins  Continue all other medications as instructed by your physician except follow the above medication instructions before surgery   Do not wear jewelry, make-up or nail polish.  Do not wear lotions, powders, or perfumes, or deodorant.  Men may shave face and neck.  Do not bring valuables to the hospital.  [facility] is not responsible for any belongings or valuables.  Contacts, dentures or bridgework may not be worn into surgery.  Leave your suitcase in the car.  After surgery it may be brought to your room.  For patients admitted to the hospital, discharge time will be determined by your treatment team.  Patients discharged the day of surgery will not be allowed to drive home.    [facility]- Preparing For Surgery  Before surgery, you can play an important role. Because skin is not sterile, your skin needs to be as free of germs as possible. You can reduce the number of germs on your skin by washing with CHG (chlorahexidine gluconate) Soap before surgery.  CHG is an antiseptic cleaner which kills germs and bonds with the skin to continue killing germs even after washing.  Please do not use if you have an  allergy to CHG or antibacterial soaps. If your skin becomes reddened/irritated stop using the CHG.  Do not shave (including legs and underarms) for at least 48 hours prior to first CHG shower. It is OK to shave your face.  Please follow these instructions carefully.   1. Shower the NIGHT BEFORE SURGERY and the MORNING OF SURGERY with CHG.   2. If you chose to wash your hair, wash your hair first as usual with your normal shampoo.  3. After you shampoo, rinse your hair and body thoroughly to remove the shampoo.  4. Use CHG as you would any other liquid soap. You can apply CHG directly to the skin and wash gently with a scrungie or a clean washcloth.   5. Apply the CHG Soap to your body ONLY FROM THE NECK DOWN.  Do not use on open wounds or open sores. Avoid contact with your eyes, ears, mouth and genitals (private parts). Wash Face and genitals (private parts)  with your normal soap.  6. Wash thoroughly, paying special attention to the area where your surgery will be performed.  7. Thoroughly rinse your body with warm water from the neck down.  8. DO NOT shower/wash with your normal soap after using and rinsing off the CHG Soap.  9. Pat yourself dry with a CLEAN TOWEL.  10. Wear CLEAN PAJAMAS to bed the night before surgery, wear comfortable clothes the morning of surgery  11. Place CLEAN SHEETS on your bed the night of your first  shower and DO NOT SLEEP WITH PETS.   Day of Surgery: Do not apply any deodorants/lotions. Please wear clean clothes to the hospital/surgery center.     Please read over the following fact sheets that you were given. Pain Booklet, Coughing and Deep Breathing, MRSA Information and Surgical Site Infection Prevention

## 2017-11-29 ENCOUNTER — Encounter (HOSPITAL_COMMUNITY): Payer: Self-pay

## 2017-11-29 ENCOUNTER — Encounter (HOSPITAL_COMMUNITY)
Admission: RE | Admit: 2017-11-29 | Discharge: 2017-11-29 | Disposition: A | Discharge status: Home / Self Care | Payer: PPO | Source: Ambulatory Visit | Attending: Neurosurgery | Admitting: Neurosurgery

## 2017-11-29 ENCOUNTER — Other Ambulatory Visit: Payer: Self-pay

## 2017-11-29 DIAGNOSIS — E785 Hyperlipidemia, unspecified: Secondary | ICD-10-CM | POA: Insufficient documentation

## 2017-11-29 DIAGNOSIS — I10 Essential (primary) hypertension: Secondary | ICD-10-CM | POA: Insufficient documentation

## 2017-11-29 DIAGNOSIS — Z96641 Presence of right artificial hip joint: Secondary | ICD-10-CM | POA: Insufficient documentation

## 2017-11-29 DIAGNOSIS — Z9889 Other specified postprocedural states: Secondary | ICD-10-CM | POA: Diagnosis not present

## 2017-11-29 DIAGNOSIS — Z96653 Presence of artificial knee joint, bilateral: Secondary | ICD-10-CM | POA: Insufficient documentation

## 2017-11-29 DIAGNOSIS — Z01818 Encounter for other preprocedural examination: Secondary | ICD-10-CM | POA: Insufficient documentation

## 2017-11-29 DIAGNOSIS — Z7982 Long term (current) use of aspirin: Secondary | ICD-10-CM | POA: Diagnosis not present

## 2017-11-29 DIAGNOSIS — M5030 Other cervical disc degeneration, unspecified cervical region: Secondary | ICD-10-CM | POA: Insufficient documentation

## 2017-11-29 DIAGNOSIS — Z79899 Other long term (current) drug therapy: Secondary | ICD-10-CM | POA: Diagnosis not present

## 2017-11-29 DIAGNOSIS — N400 Enlarged prostate without lower urinary tract symptoms: Secondary | ICD-10-CM | POA: Insufficient documentation

## 2017-11-29 DIAGNOSIS — K219 Gastro-esophageal reflux disease without esophagitis: Secondary | ICD-10-CM | POA: Insufficient documentation

## 2017-11-29 LAB — CBC WITH DIFFERENTIAL/PLATELET
BASOS PCT: 1 %
Basophils Absolute: 0.1 10*3/uL (ref 0.0–0.1)
Eosinophils Absolute: 0.3 10*3/uL (ref 0.0–0.7)
Eosinophils Relative: 4 %
HEMATOCRIT: 44.4 % (ref 39.0–52.0)
Hemoglobin: 15.1 g/dL (ref 13.0–17.0)
LYMPHS PCT: 25 %
Lymphs Abs: 1.7 10*3/uL (ref 0.7–4.0)
MCH: 29.9 pg (ref 26.0–34.0)
MCHC: 34 g/dL (ref 30.0–36.0)
MCV: 87.9 fL (ref 78.0–100.0)
MONO ABS: 0.5 10*3/uL (ref 0.1–1.0)
MONOS PCT: 7 %
NEUTROS ABS: 4.3 10*3/uL (ref 1.7–7.7)
Neutrophils Relative %: 63 %
Platelets: 199 10*3/uL (ref 150–400)
RBC: 5.05 MIL/uL (ref 4.22–5.81)
RDW: 13.7 % (ref 11.5–15.5)
WBC: 6.7 10*3/uL (ref 4.0–10.5)

## 2017-11-29 LAB — SURGICAL PCR SCREEN
MRSA, PCR: NEGATIVE
STAPHYLOCOCCUS AUREUS: NEGATIVE

## 2017-11-29 LAB — BASIC METABOLIC PANEL
ANION GAP: 9 (ref 5–15)
BUN: 19 mg/dL (ref 6–20)
CALCIUM: 9.3 mg/dL (ref 8.9–10.3)
CO2: 25 mmol/L (ref 22–32)
Chloride: 106 mmol/L (ref 101–111)
Creatinine, Ser: 0.95 mg/dL (ref 0.61–1.24)
GFR calc Af Amer: >60 mL/min (ref >60)
GFR calc non Af Amer: >60 mL/min (ref >60)
GLUCOSE: 110 mg/dL — AB (ref 65–99)
Potassium: 3.8 mmol/L (ref 3.5–5.1)
Sodium: 140 mmol/L (ref 135–145)

## 2017-11-29 LAB — ABO/RH: ABO/RH(D): A NEG

## 2017-11-29 LAB — TYPE AND SCREEN
ABO/RH(D): A NEG
Antibody Screen: NEGATIVE

## 2017-11-29 MED ORDER — CHLORHEXIDINE GLUCONATE CLOTH 2 % EX PADS: 6.0000 | MEDICATED_PAD | Freq: Once | CUTANEOUS | Status: DC

## 2017-11-29 NOTE — Progress Notes (Addendum)
PCP: Hollace Kinnier, DO  Cardiologist: Shelva Majestic, MD  EKG:11/27/17 in [place] and on chart  Stress test:06/15/12 in [facility]  ECHO:08/23/16 in EP[facility]  Cardiac Cath:09/05/13 in EPIC  Chest x-ray: pt denies past year, no recent respiratory infections/complications  Pt will stop aspirin 81 mg 12/07/17

## 2017-12-01 ENCOUNTER — Encounter (HOSPITAL_COMMUNITY): Payer: Self-pay | Admitting: Vascular Surgery

## 2017-12-01 ENCOUNTER — Encounter (HOSPITAL_COMMUNITY): Payer: Self-pay | Admitting: Certified Registered"

## 2017-12-01 NOTE — Progress Notes (Signed)
Anesthesia Chart Review: Patient is a 67 year old male scheduled for ACDF C4-5, C5-6 on 12/12/17 by Dr. Earnie Larsson.  History includes never smoker, HTN, GERD, dyslipidemia, chest pain (s/p LHC, normal '15), GERD, BPH, tinnitus, right inguinal hernia repair 11/20/09, left TKA 06/24/10, right THA 09/02/10, right TKA 12/16/10, left THA 03/10/11. He had a small hiatal hernia on 10/09/14 Barium swallow study.   PCP is Dr. Hollace Kinnier.  Cardiologist is Dr. Shelva Majestic. He was sent for preoperative evaluation, and he was cleared for upcoming c-spine surgery.   Meds include ASA 81mg  (last dose 12/07/17), amlodipine, benazepril, Nexium, Zetia, Pepcid, finasteride, HCTZ, Loratadine, Nitro, Cospt ophthalmic, Alphagan ophthalmic.  BP 140/74   Pulse 68   Temp 36.6 C (Oral)   Resp 18   Ht 5\' 10"  (1.778 m)   Wt 253 lb (114.8 kg)   SpO2 99%   BMI 36.30 kg/m   EKG 11/27/17: NSR.  Echo 08/23/16: Study Conclusions - Left ventricle: The cavity size was normal. There was mild   concentric hypertrophy. Systolic function was normal. The   estimated ejection fraction was in the range of 55% to 60%. Wall   motion was normal; there were no regional wall motion   abnormalities. Doppler parameters are consistent with abnormal   left ventricular relaxation (grade 1 diastolic dysfunction). - Pulmonary arteries: Systolic pressure was mildly increased. PA   peak pressure: 34 mm Hg (S).  Cardiac cath 09/05/13 (done due to ongoing chest pain despite normal stress test): MPRESSION: Normal LV function Normal coronary arteries  Nuclear stress test 08/29/13: Overall Impression:  Normal stress nuclear study. LV Wall Motion:  NL LV Function; NL Wall Motion  Preoperative labs noted.  If no acute changes and anticipate that he can proceed as planned.  George Hugh [facility] Stay [facility]/Anesthesiology Phone [phone] 12/01/2017 1:32 PM

## 2017-12-11 MED ORDER — DEXTROSE 5 % IV SOLN
3.0000 g | INTRAVENOUS | Status: DC
  Filled 2017-12-11: qty 3000

## 2017-12-11 MED ORDER — DEXAMETHASONE SODIUM PHOSPHATE 10 MG/ML IJ SOLN
10.0000 mg | INTRAMUSCULAR | Status: DC
  Filled 2017-12-11: qty 1

## 2017-12-12 ENCOUNTER — Encounter (HOSPITAL_COMMUNITY): Payer: Self-pay | Admitting: Orthopedic Surgery

## 2017-12-12 ENCOUNTER — Encounter (HOSPITAL_COMMUNITY)
Admission: RE | Disposition: A | Discharge status: Home / Self Care | Payer: Self-pay | Source: Ambulatory Visit | Attending: Neurosurgery

## 2017-12-12 ENCOUNTER — Other Ambulatory Visit: Payer: Self-pay

## 2017-12-12 ENCOUNTER — Ambulatory Visit (HOSPITAL_COMMUNITY)
Admission: RE | Admit: 2017-12-12 | Discharge: 2017-12-12 | Disposition: A | Discharge status: Home / Self Care | Payer: PPO | Source: Ambulatory Visit | Attending: Neurosurgery | Admitting: Neurosurgery

## 2017-12-12 DIAGNOSIS — I10 Essential (primary) hypertension: Secondary | ICD-10-CM | POA: Diagnosis not present

## 2017-12-12 DIAGNOSIS — Z538 Procedure and treatment not carried out for other reasons: Secondary | ICD-10-CM | POA: Diagnosis not present

## 2017-12-12 SURGERY — ANTERIOR CERVICAL DECOMPRESSION/DISCECTOMY FUSION 2 LEVELS
Anesthesia: General

## 2017-12-12 MED ORDER — PROPOFOL 10 MG/ML IV BOLUS
INTRAVENOUS | Status: AC
  Filled 2017-12-12: qty 20

## 2017-12-12 MED ORDER — FENTANYL CITRATE (PF) 250 MCG/5ML IJ SOLN
INTRAMUSCULAR | Status: AC
  Filled 2017-12-12: qty 5

## 2017-12-12 MED ORDER — LACTATED RINGERS IV SOLN
INTRAVENOUS | Status: DC
  Administered 2017-12-12: 10:00:00 via INTRAVENOUS

## 2017-12-12 MED ORDER — MIDAZOLAM HCL 2 MG/2ML IJ SOLN
INTRAMUSCULAR | Status: AC
  Filled 2017-12-12: qty 2

## 2017-12-12 NOTE — Progress Notes (Signed)
Procedure cancelled due to insurance company denying claim. Patient discharged to home with wife. Prescriptions have been called into patients pharmacy.

## 2017-12-12 NOTE — Anesthesia Preprocedure Evaluation (Deleted)
Anesthesia Evaluation  Patient identified by MRN, date of birth, ID band Patient awake    Reviewed: Allergy & Precautions, NPO status , Patient's Chart, lab work & pertinent test results  Airway Mallampati: II  TM Distance: >3 FB Neck ROM: Full    Dental  (+) Teeth Intact, Dental Advisory Given   Pulmonary    breath sounds clear to auscultation       Cardiovascular hypertension,  Rhythm:Regular Rate:Normal     Neuro/Psych    GI/Hepatic   Endo/Other    Renal/GU      Musculoskeletal   Abdominal   Peds  Hematology   Anesthesia Other Findings   Reproductive/Obstetrics                             Anesthesia Physical Anesthesia Plan  ASA: III  Anesthesia Plan: General   Post-op Pain Management:  Regional for Post-op pain   Induction:   PONV Risk Score and Plan: 1 and Ondansetron and Dexamethasone  Airway Management Planned: Oral ETT  Additional Equipment:   Intra-op Plan:   Post-operative Plan: Extubation in OR  Informed Consent: I have reviewed the patients History and Physical, chart, labs and discussed the procedure including the risks, benefits and alternatives for the proposed anesthesia with the patient or authorized representative who has indicated his/her understanding and acceptance.   Dental advisory given  Plan Discussed with: CRNA and Anesthesiologist  Anesthesia Plan Comments:        Anesthesia Quick Evaluation

## 2018-01-02 DIAGNOSIS — H40053 Ocular hypertension, bilateral: Secondary | ICD-10-CM | POA: Diagnosis not present

## 2018-01-17 DIAGNOSIS — R490 Dysphonia: Secondary | ICD-10-CM | POA: Diagnosis not present

## 2018-01-17 DIAGNOSIS — K219 Gastro-esophageal reflux disease without esophagitis: Secondary | ICD-10-CM | POA: Diagnosis not present

## 2018-01-17 DIAGNOSIS — R1310 Dysphagia, unspecified: Secondary | ICD-10-CM | POA: Diagnosis not present

## 2018-01-25 DIAGNOSIS — M4802 Spinal stenosis, cervical region: Secondary | ICD-10-CM | POA: Diagnosis not present

## 2018-01-25 DIAGNOSIS — G992 Myelopathy in diseases classified elsewhere: Secondary | ICD-10-CM | POA: Diagnosis not present

## 2018-01-25 DIAGNOSIS — I10 Essential (primary) hypertension: Secondary | ICD-10-CM | POA: Diagnosis not present

## 2018-01-25 DIAGNOSIS — Z6831 Body mass index (BMI) 31.0-31.9, adult: Secondary | ICD-10-CM | POA: Diagnosis not present

## 2018-02-15 DIAGNOSIS — H40051 Ocular hypertension, right eye: Secondary | ICD-10-CM | POA: Diagnosis not present

## 2018-02-16 ENCOUNTER — Other Ambulatory Visit: Payer: Self-pay | Admitting: Neurosurgery

## 2018-02-16 ENCOUNTER — Encounter (HOSPITAL_COMMUNITY): Payer: Self-pay | Admitting: *Deleted

## 2018-02-16 ENCOUNTER — Other Ambulatory Visit: Payer: Self-pay

## 2018-02-16 NOTE — Progress Notes (Signed)
Pt denies any acute cardiopulmonary issues. Pt under the care of Dr. Claiborne Billings, Cardiology. Pt stated that he had not been taking Aspirin since the previous surgery was cancelled. Pt made aware to stop taking vitamins, fish oil and herbal medications. Do not take any NSAIDs ie: Ibuprofen, Advil, Naproxen ( Aleve), Motrin, BC and Goody Powder. Pt verbalized understanding of all pre-op instructions.

## 2018-02-16 NOTE — Progress Notes (Signed)
   02/16/18 1500  OBSTRUCTIVE SLEEP APNEA  Have you ever been diagnosed with sleep apnea through a sleep study? No  Do you snore loudly (loud enough to be heard through closed doors)?  1  Do you often feel tired, fatigued, or sleepy during the daytime (such as falling asleep during driving or talking to someone)? 0  Has anyone observed you stop breathing during your sleep? 0  Do you have, or are you being treated for high blood pressure? 1  BMI more than 35 kg/m2?  (assess dos)  Age > 50 (1-yes) 1  Neck circumference greater than:Male 16 inches or larger, Male 17inches or larger? 1  Male Gender (Yes=1) 1  Obstructive Sleep Apnea Score 5

## 2018-02-19 ENCOUNTER — Encounter (HOSPITAL_COMMUNITY): Payer: Self-pay | Admitting: *Deleted

## 2018-02-19 ENCOUNTER — Encounter (HOSPITAL_COMMUNITY)
Admission: RE | Disposition: A | Discharge status: Home / Self Care | Payer: Self-pay | Source: Home / Self Care | Attending: Neurosurgery

## 2018-02-19 ENCOUNTER — Inpatient Hospital Stay (HOSPITAL_COMMUNITY)
Admission: RE | Admit: 2018-02-19 | Discharge: 2018-02-20 | DRG: 472 | Disposition: A | Discharge status: Home / Self Care | Payer: PPO | Attending: Neurosurgery | Admitting: Neurosurgery

## 2018-02-19 ENCOUNTER — Inpatient Hospital Stay (HOSPITAL_COMMUNITY): Payer: PPO

## 2018-02-19 ENCOUNTER — Inpatient Hospital Stay (HOSPITAL_COMMUNITY): Payer: PPO | Admitting: Anesthesiology

## 2018-02-19 ENCOUNTER — Other Ambulatory Visit: Payer: Self-pay

## 2018-02-19 DIAGNOSIS — Z8249 Family history of ischemic heart disease and other diseases of the circulatory system: Secondary | ICD-10-CM

## 2018-02-19 DIAGNOSIS — M4712 Other spondylosis with myelopathy, cervical region: Secondary | ICD-10-CM | POA: Diagnosis present

## 2018-02-19 DIAGNOSIS — M4802 Spinal stenosis, cervical region: Secondary | ICD-10-CM | POA: Diagnosis not present

## 2018-02-19 DIAGNOSIS — N401 Enlarged prostate with lower urinary tract symptoms: Secondary | ICD-10-CM | POA: Diagnosis not present

## 2018-02-19 DIAGNOSIS — Z79891 Long term (current) use of opiate analgesic: Secondary | ICD-10-CM | POA: Diagnosis not present

## 2018-02-19 DIAGNOSIS — E785 Hyperlipidemia, unspecified: Secondary | ICD-10-CM | POA: Diagnosis present

## 2018-02-19 DIAGNOSIS — Z823 Family history of stroke: Secondary | ICD-10-CM | POA: Diagnosis not present

## 2018-02-19 DIAGNOSIS — Z888 Allergy status to other drugs, medicaments and biological substances status: Secondary | ICD-10-CM

## 2018-02-19 DIAGNOSIS — Z833 Family history of diabetes mellitus: Secondary | ICD-10-CM | POA: Diagnosis not present

## 2018-02-19 DIAGNOSIS — Z91040 Latex allergy status: Secondary | ICD-10-CM

## 2018-02-19 DIAGNOSIS — G992 Myelopathy in diseases classified elsewhere: Secondary | ICD-10-CM | POA: Diagnosis not present

## 2018-02-19 DIAGNOSIS — G959 Disease of spinal cord, unspecified: Secondary | ICD-10-CM | POA: Diagnosis present

## 2018-02-19 DIAGNOSIS — Z808 Family history of malignant neoplasm of other organs or systems: Secondary | ICD-10-CM | POA: Diagnosis not present

## 2018-02-19 DIAGNOSIS — Z800 Family history of malignant neoplasm of digestive organs: Secondary | ICD-10-CM | POA: Diagnosis not present

## 2018-02-19 DIAGNOSIS — Z79899 Other long term (current) drug therapy: Secondary | ICD-10-CM

## 2018-02-19 DIAGNOSIS — K219 Gastro-esophageal reflux disease without esophagitis: Secondary | ICD-10-CM | POA: Diagnosis not present

## 2018-02-19 DIAGNOSIS — I10 Essential (primary) hypertension: Secondary | ICD-10-CM | POA: Diagnosis not present

## 2018-02-19 DIAGNOSIS — Z7982 Long term (current) use of aspirin: Secondary | ICD-10-CM | POA: Diagnosis not present

## 2018-02-19 DIAGNOSIS — Z885 Allergy status to narcotic agent status: Secondary | ICD-10-CM

## 2018-02-19 DIAGNOSIS — Z807 Family history of other malignant neoplasms of lymphoid, hematopoietic and related tissues: Secondary | ICD-10-CM | POA: Diagnosis not present

## 2018-02-19 DIAGNOSIS — Z96643 Presence of artificial hip joint, bilateral: Secondary | ICD-10-CM | POA: Diagnosis present

## 2018-02-19 DIAGNOSIS — M4322 Fusion of spine, cervical region: Secondary | ICD-10-CM | POA: Diagnosis not present

## 2018-02-19 DIAGNOSIS — Z6837 Body mass index (BMI) 37.0-37.9, adult: Secondary | ICD-10-CM | POA: Diagnosis not present

## 2018-02-19 DIAGNOSIS — Z96653 Presence of artificial knee joint, bilateral: Secondary | ICD-10-CM | POA: Diagnosis present

## 2018-02-19 DIAGNOSIS — Z419 Encounter for procedure for purposes other than remedying health state, unspecified: Secondary | ICD-10-CM

## 2018-02-19 DIAGNOSIS — M2578 Osteophyte, vertebrae: Secondary | ICD-10-CM | POA: Diagnosis not present

## 2018-02-19 DIAGNOSIS — Z9049 Acquired absence of other specified parts of digestive tract: Secondary | ICD-10-CM | POA: Diagnosis not present

## 2018-02-19 HISTORY — DX: Spinal stenosis, cervical region: M48.02

## 2018-02-19 HISTORY — DX: Personal history of other diseases of the digestive system: Z87.19

## 2018-02-19 HISTORY — PX: ANTERIOR CERVICAL DECOMP/DISCECTOMY FUSION: SHX1161

## 2018-02-19 LAB — BASIC METABOLIC PANEL
Anion gap: 8 (ref 5–15)
BUN: 9 mg/dL (ref 8–23)
CHLORIDE: 107 mmol/L (ref 98–111)
CO2: 25 mmol/L (ref 22–32)
CREATININE: 0.92 mg/dL (ref 0.61–1.24)
Calcium: 9.1 mg/dL (ref 8.9–10.3)
GFR calc non Af Amer: >60 mL/min (ref >60)
Glucose, Bld: 100 mg/dL — ABNORMAL HIGH (ref 70–99)
POTASSIUM: 3.4 mmol/L — AB (ref 3.5–5.1)
Sodium: 140 mmol/L (ref 135–145)

## 2018-02-19 LAB — CBC
HEMATOCRIT: 43 % (ref 39.0–52.0)
HEMOGLOBIN: 14.6 g/dL (ref 13.0–17.0)
MCH: 29.5 pg (ref 26.0–34.0)
MCHC: 34 g/dL (ref 30.0–36.0)
MCV: 86.9 fL (ref 78.0–100.0)
Platelets: 188 10*3/uL (ref 150–400)
RBC: 4.95 MIL/uL (ref 4.22–5.81)
RDW: 12.6 % (ref 11.5–15.5)
WBC: 6.7 10*3/uL (ref 4.0–10.5)

## 2018-02-19 SURGERY — ANTERIOR CERVICAL DECOMPRESSION/DISCECTOMY FUSION 2 LEVELS
Anesthesia: General | Site: Spine Cervical

## 2018-02-19 MED ORDER — ROCURONIUM BROMIDE 100 MG/10ML IV SOLN
INTRAVENOUS | Status: DC | PRN
  Administered 2018-02-19: 50 mg via INTRAVENOUS

## 2018-02-19 MED ORDER — HYDROMORPHONE HCL 1 MG/ML IJ SOLN: 0.2500 mg | INTRAMUSCULAR | Status: DC | PRN

## 2018-02-19 MED ORDER — PHENOL 1.4 % MT LIQD: 1.0000 | OROMUCOSAL | Status: DC | PRN

## 2018-02-19 MED ORDER — CEFAZOLIN SODIUM-DEXTROSE 2-4 GM/100ML-% IV SOLN: 2.0000 g | INTRAVENOUS | Status: DC

## 2018-02-19 MED ORDER — HYDROCODONE-ACETAMINOPHEN 10-325 MG PO TABS
2.0000 | ORAL_TABLET | ORAL | Status: DC | PRN
  Administered 2018-02-19 – 2018-02-20 (×4): 2 via ORAL
  Filled 2018-02-19 (×4): qty 2

## 2018-02-19 MED ORDER — CYCLOBENZAPRINE HCL 10 MG PO TABS
10.0000 mg | ORAL_TABLET | Freq: Three times a day (TID) | ORAL | Status: DC | PRN
  Administered 2018-02-19: 10 mg via ORAL

## 2018-02-19 MED ORDER — CHLORHEXIDINE GLUCONATE CLOTH 2 % EX PADS: 6.0000 | MEDICATED_PAD | Freq: Once | CUTANEOUS | Status: DC

## 2018-02-19 MED ORDER — ACETAMINOPHEN 650 MG RE SUPP: 650.0000 mg | RECTAL | Status: DC | PRN

## 2018-02-19 MED ORDER — EZETIMIBE 10 MG PO TABS
10.0000 mg | ORAL_TABLET | Freq: Every morning | ORAL | Status: DC
  Filled 2018-02-19: qty 1

## 2018-02-19 MED ORDER — FENTANYL CITRATE (PF) 100 MCG/2ML IJ SOLN
25.0000 ug | INTRAMUSCULAR | Status: DC | PRN
  Administered 2018-02-19 (×2): 50 ug via INTRAVENOUS

## 2018-02-19 MED ORDER — PHENYLEPHRINE 40 MCG/ML (10ML) SYRINGE FOR IV PUSH (FOR BLOOD PRESSURE SUPPORT)
PREFILLED_SYRINGE | INTRAVENOUS | Status: DC | PRN
  Administered 2018-02-19: 40 ug via INTRAVENOUS

## 2018-02-19 MED ORDER — MENTHOL 3 MG MT LOZG
1.0000 | LOZENGE | OROMUCOSAL | Status: DC | PRN
  Filled 2018-02-19: qty 9

## 2018-02-19 MED ORDER — DEXAMETHASONE SODIUM PHOSPHATE 10 MG/ML IJ SOLN
10.0000 mg | INTRAMUSCULAR | Status: AC
  Administered 2018-02-19: 10 mg via INTRAVENOUS

## 2018-02-19 MED ORDER — FENTANYL CITRATE (PF) 100 MCG/2ML IJ SOLN
INTRAMUSCULAR | Status: DC | PRN
  Administered 2018-02-19 (×2): 50 ug via INTRAVENOUS
  Administered 2018-02-19: 150 ug via INTRAVENOUS

## 2018-02-19 MED ORDER — SCOPOLAMINE 1 MG/3DAYS TD PT72
MEDICATED_PATCH | TRANSDERMAL | Status: AC
  Administered 2018-02-19: 1.5 mg via TRANSDERMAL
  Filled 2018-02-19: qty 1

## 2018-02-19 MED ORDER — ACETAMINOPHEN 325 MG PO TABS: 650.0000 mg | ORAL_TABLET | ORAL | Status: DC | PRN

## 2018-02-19 MED ORDER — SODIUM CHLORIDE 0.9 % IV SOLN: 250.0000 mL | INTRAVENOUS | Status: DC

## 2018-02-19 MED ORDER — GLYCOPYRROLATE PF 0.2 MG/ML IJ SOSY
PREFILLED_SYRINGE | INTRAMUSCULAR | Status: AC
  Filled 2018-02-19: qty 1

## 2018-02-19 MED ORDER — SUGAMMADEX SODIUM 200 MG/2ML IV SOLN
INTRAVENOUS | Status: AC
  Filled 2018-02-19: qty 2

## 2018-02-19 MED ORDER — LACTATED RINGERS IV SOLN
INTRAVENOUS | Status: DC
  Administered 2018-02-19: 11:00:00 via INTRAVENOUS

## 2018-02-19 MED ORDER — HYDROMORPHONE HCL 1 MG/ML IJ SOLN: 1.0000 mg | INTRAMUSCULAR | Status: DC | PRN

## 2018-02-19 MED ORDER — LIDOCAINE 2% (20 MG/ML) 5 ML SYRINGE
INTRAMUSCULAR | Status: AC
  Filled 2018-02-19: qty 5

## 2018-02-19 MED ORDER — SCOPOLAMINE 1 MG/3DAYS TD PT72
1.0000 | MEDICATED_PATCH | TRANSDERMAL | Status: DC
  Administered 2018-02-19: 1.5 mg via TRANSDERMAL
  Filled 2018-02-19: qty 1

## 2018-02-19 MED ORDER — FINASTERIDE 5 MG PO TABS: 5.0000 mg | ORAL_TABLET | Freq: Every day | ORAL | Status: DC

## 2018-02-19 MED ORDER — HYDROCODONE-ACETAMINOPHEN 5-325 MG PO TABS: 1.0000 | ORAL_TABLET | ORAL | Status: DC | PRN

## 2018-02-19 MED ORDER — BENAZEPRIL HCL 10 MG PO TABS
10.0000 mg | ORAL_TABLET | Freq: Every day | ORAL | Status: DC
Start: 2018-02-19 — End: 2018-02-20
  Administered 2018-02-19: 10 mg via ORAL
  Filled 2018-02-19 (×2): qty 1

## 2018-02-19 MED ORDER — PROMETHAZINE HCL 25 MG/ML IJ SOLN: 6.2500 mg | INTRAMUSCULAR | Status: DC | PRN

## 2018-02-19 MED ORDER — FENTANYL CITRATE (PF) 100 MCG/2ML IJ SOLN
INTRAMUSCULAR | Status: AC
  Filled 2018-02-19: qty 2

## 2018-02-19 MED ORDER — ONDANSETRON HCL 4 MG/2ML IJ SOLN: 4.0000 mg | Freq: Once | INTRAMUSCULAR | Status: DC | PRN

## 2018-02-19 MED ORDER — DEXTROSE 5 % IV SOLN
3.0000 g | INTRAVENOUS | Status: AC
  Administered 2018-02-19: 3 g via INTRAVENOUS
  Filled 2018-02-19: qty 3

## 2018-02-19 MED ORDER — MIDAZOLAM HCL 5 MG/5ML IJ SOLN
INTRAMUSCULAR | Status: DC | PRN
  Administered 2018-02-19: 2 mg via INTRAVENOUS

## 2018-02-19 MED ORDER — DEXAMETHASONE SODIUM PHOSPHATE 10 MG/ML IJ SOLN
INTRAMUSCULAR | Status: AC
  Filled 2018-02-19: qty 1

## 2018-02-19 MED ORDER — ONDANSETRON HCL 4 MG/2ML IJ SOLN: 4.0000 mg | Freq: Four times a day (QID) | INTRAMUSCULAR | Status: DC | PRN

## 2018-02-19 MED ORDER — CEFAZOLIN SODIUM-DEXTROSE 2-4 GM/100ML-% IV SOLN
INTRAVENOUS | Status: AC
  Filled 2018-02-19: qty 100

## 2018-02-19 MED ORDER — SODIUM CHLORIDE 0.9% FLUSH: 3.0000 mL | INTRAVENOUS | Status: DC | PRN

## 2018-02-19 MED ORDER — AMLODIPINE BESYLATE 5 MG PO TABS: 10.0000 mg | ORAL_TABLET | Freq: Every morning | ORAL | Status: DC

## 2018-02-19 MED ORDER — LIDOCAINE 2% (20 MG/ML) 5 ML SYRINGE
INTRAMUSCULAR | Status: DC | PRN
  Administered 2018-02-19: 100 mg via INTRAVENOUS

## 2018-02-19 MED ORDER — 0.9 % SODIUM CHLORIDE (POUR BTL) OPTIME
TOPICAL | Status: DC | PRN
Start: 2018-02-19 — End: 2018-02-19
  Administered 2018-02-19: 1000 mL

## 2018-02-19 MED ORDER — BRIMONIDINE TARTRATE 0.15 % OP SOLN
1.0000 [drp] | Freq: Two times a day (BID) | OPHTHALMIC | Status: DC
  Filled 2018-02-19 (×2): qty 5

## 2018-02-19 MED ORDER — PROPOFOL 10 MG/ML IV BOLUS
INTRAVENOUS | Status: DC | PRN
  Administered 2018-02-19: 200 mg via INTRAVENOUS

## 2018-02-19 MED ORDER — HEMOSTATIC AGENTS (NO CHARGE) OPTIME
TOPICAL | Status: DC | PRN
  Administered 2018-02-19: 1 via TOPICAL

## 2018-02-19 MED ORDER — SUCCINYLCHOLINE CHLORIDE 200 MG/10ML IV SOSY
PREFILLED_SYRINGE | INTRAVENOUS | Status: AC
  Filled 2018-02-19: qty 10

## 2018-02-19 MED ORDER — PANTOPRAZOLE SODIUM 40 MG PO TBEC
40.0000 mg | DELAYED_RELEASE_TABLET | Freq: Every day | ORAL | Status: DC
  Administered 2018-02-19: 40 mg via ORAL
  Filled 2018-02-19: qty 1

## 2018-02-19 MED ORDER — FENTANYL CITRATE (PF) 250 MCG/5ML IJ SOLN
INTRAMUSCULAR | Status: AC
  Filled 2018-02-19: qty 5

## 2018-02-19 MED ORDER — SUCCINYLCHOLINE CHLORIDE 200 MG/10ML IV SOSY
PREFILLED_SYRINGE | INTRAVENOUS | Status: DC | PRN
  Administered 2018-02-19: 120 mg via INTRAVENOUS

## 2018-02-19 MED ORDER — DORZOLAMIDE HCL-TIMOLOL MAL 2-0.5 % OP SOLN
2.0000 [drp] | Freq: Two times a day (BID) | OPHTHALMIC | Status: DC
Start: 2018-02-19 — End: 2018-02-20
  Filled 2018-02-19 (×2): qty 10

## 2018-02-19 MED ORDER — PROPOFOL 10 MG/ML IV BOLUS
INTRAVENOUS | Status: AC
  Filled 2018-02-19: qty 20

## 2018-02-19 MED ORDER — THROMBIN 5000 UNITS EX SOLR
CUTANEOUS | Status: DC | PRN
  Administered 2018-02-19 (×2): 5000 [IU] via TOPICAL

## 2018-02-19 MED ORDER — ONDANSETRON HCL 4 MG/2ML IJ SOLN
INTRAMUSCULAR | Status: AC
Start: 2018-02-19 — End: ?
  Filled 2018-02-19: qty 2

## 2018-02-19 MED ORDER — FAMOTIDINE 20 MG PO TABS
20.0000 mg | ORAL_TABLET | Freq: Every day | ORAL | Status: DC
  Administered 2018-02-19: 20 mg via ORAL
  Filled 2018-02-19: qty 1

## 2018-02-19 MED ORDER — SODIUM CHLORIDE 0.9 % IV SOLN
INTRAVENOUS | Status: DC | PRN
  Administered 2018-02-19: 500 mL

## 2018-02-19 MED ORDER — CEFAZOLIN SODIUM-DEXTROSE 1-4 GM/50ML-% IV SOLN
1.0000 g | Freq: Three times a day (TID) | INTRAVENOUS | Status: AC
  Administered 2018-02-19 – 2018-02-20 (×2): 1 g via INTRAVENOUS
  Filled 2018-02-19 (×2): qty 50

## 2018-02-19 MED ORDER — CYCLOBENZAPRINE HCL 10 MG PO TABS
ORAL_TABLET | ORAL | Status: AC
  Filled 2018-02-19: qty 1

## 2018-02-19 MED ORDER — SODIUM CHLORIDE 0.9% FLUSH: 3.0000 mL | Freq: Two times a day (BID) | INTRAVENOUS | Status: DC

## 2018-02-19 MED ORDER — ONDANSETRON HCL 4 MG PO TABS: 4.0000 mg | ORAL_TABLET | Freq: Four times a day (QID) | ORAL | Status: DC | PRN

## 2018-02-19 MED ORDER — ONDANSETRON HCL 4 MG/2ML IJ SOLN
INTRAMUSCULAR | Status: DC | PRN
  Administered 2018-02-19: 4 mg via INTRAVENOUS

## 2018-02-19 MED ORDER — ROCURONIUM BROMIDE 10 MG/ML (PF) SYRINGE
PREFILLED_SYRINGE | INTRAVENOUS | Status: AC
  Filled 2018-02-19: qty 10

## 2018-02-19 MED ORDER — THROMBIN 5000 UNITS EX SOLR
CUTANEOUS | Status: AC
  Filled 2018-02-19: qty 15000

## 2018-02-19 MED ORDER — PHENYLEPHRINE HCL 10 MG/ML IJ SOLN
INTRAVENOUS | Status: DC | PRN
  Administered 2018-02-19: 20 ug/min via INTRAVENOUS

## 2018-02-19 MED ORDER — KETOROLAC TROMETHAMINE 30 MG/ML IJ SOLN
INTRAMUSCULAR | Status: AC
  Filled 2018-02-19: qty 1

## 2018-02-19 MED ORDER — SUGAMMADEX SODIUM 200 MG/2ML IV SOLN
INTRAVENOUS | Status: DC | PRN
  Administered 2018-02-19: 300 mg via INTRAVENOUS

## 2018-02-19 SURGICAL SUPPLY — 67 items
APL SKNCLS STERI-STRIP NONHPOA (GAUZE/BANDAGES/DRESSINGS) ×1
BAG DECANTER FOR FLEXI CONT (MISCELLANEOUS) ×3 IMPLANT
BENZOIN TINCTURE PRP APPL 2/3 (GAUZE/BANDAGES/DRESSINGS) ×3 IMPLANT
BIT DRILL 13 (BIT) ×1 IMPLANT
BIT DRILL 13MM (BIT) ×1
BUR MATCHSTICK NEURO 3.0 LAGG (BURR) ×3 IMPLANT
CANISTER SUCT 3000ML PPV (MISCELLANEOUS) ×3 IMPLANT
CARTRIDGE OIL MAESTRO DRILL (MISCELLANEOUS) ×1 IMPLANT
CLOSURE STERI-STRIP 1/2X4 (GAUZE/BANDAGES/DRESSINGS) ×1
CLOSURE WOUND 1/2 X4 (GAUZE/BANDAGES/DRESSINGS) ×1
CLSR STERI-STRIP ANTIMIC 1/2X4 (GAUZE/BANDAGES/DRESSINGS) ×1 IMPLANT
DIFFUSER DRILL AIR PNEUMATIC (MISCELLANEOUS) ×3 IMPLANT
DRAPE C-ARM 42X72 X-RAY (DRAPES) ×6 IMPLANT
DRAPE LAPAROTOMY 100X72 PEDS (DRAPES) ×3 IMPLANT
DRAPE MICROSCOPE LEICA (MISCELLANEOUS) ×3 IMPLANT
DURAPREP 6ML APPLICATOR 50/CS (WOUND CARE) ×3 IMPLANT
ELECT COATED BLADE 2.86 ST (ELECTRODE) ×3 IMPLANT
ELECT REM PT RETURN 9FT ADLT (ELECTROSURGICAL) ×3
ELECTRODE REM PT RTRN 9FT ADLT (ELECTROSURGICAL) ×1 IMPLANT
GAUZE SPONGE 4X4 12PLY STRL (GAUZE/BANDAGES/DRESSINGS) ×1 IMPLANT
GAUZE SPONGE 4X4 12PLY STRL LF (GAUZE/BANDAGES/DRESSINGS) ×2 IMPLANT
GAUZE SPONGE 4X4 16PLY XRAY LF (GAUZE/BANDAGES/DRESSINGS) IMPLANT
GLOVE BIOGEL PI IND STRL 6.5 (GLOVE) IMPLANT
GLOVE BIOGEL PI IND STRL 7.0 (GLOVE) IMPLANT
GLOVE BIOGEL PI INDICATOR 6.5 (GLOVE) ×2
GLOVE BIOGEL PI INDICATOR 7.0 (GLOVE) ×2
GLOVE ECLIPSE 9.0 STRL (GLOVE) ×1 IMPLANT
GLOVE EXAM NITRILE LRG STRL (GLOVE) IMPLANT
GLOVE EXAM NITRILE XL STR (GLOVE) IMPLANT
GLOVE EXAM NITRILE XS STR PU (GLOVE) IMPLANT
GLOVE SS PI 9.0 STRL (GLOVE) ×2 IMPLANT
GLOVE SURG SS PI 6.5 STRL IVOR (GLOVE) ×2 IMPLANT
GLOVE SURG SS PI 7.5 STRL IVOR (GLOVE) ×2 IMPLANT
GOWN STRL REUS W/ TWL LRG LVL3 (GOWN DISPOSABLE) IMPLANT
GOWN STRL REUS W/ TWL XL LVL3 (GOWN DISPOSABLE) ×1 IMPLANT
GOWN STRL REUS W/TWL 2XL LVL3 (GOWN DISPOSABLE) IMPLANT
GOWN STRL REUS W/TWL LRG LVL3 (GOWN DISPOSABLE) ×6
GOWN STRL REUS W/TWL XL LVL3 (GOWN DISPOSABLE) ×3
HALTER HD/CHIN CERV TRACTION D (MISCELLANEOUS) ×3 IMPLANT
HEMOSTAT POWDER KIT SURGIFOAM (HEMOSTASIS) IMPLANT
HEMOSTAT SURGICEL 2X14 (HEMOSTASIS) IMPLANT
KIT BASIN OR (CUSTOM PROCEDURE TRAY) ×3 IMPLANT
KIT TURNOVER KIT B (KITS) ×3 IMPLANT
NDL SPNL 20GX3.5 QUINCKE YW (NEEDLE) ×1 IMPLANT
NEEDLE SPNL 20GX3.5 QUINCKE YW (NEEDLE) ×3 IMPLANT
NS IRRIG 1000ML POUR BTL (IV SOLUTION) ×3 IMPLANT
OIL CARTRIDGE MAESTRO DRILL (MISCELLANEOUS) ×3
PACK LAMINECTOMY NEURO (CUSTOM PROCEDURE TRAY) ×3 IMPLANT
PAD ARMBOARD 7.5X6 YLW CONV (MISCELLANEOUS) ×9 IMPLANT
PEEK CAGE 7X14X11 (Cage) ×2 IMPLANT
PEEK CAGE 8X14X11 (Peek) ×2 IMPLANT
PLATE 45MM (Plate) ×3 IMPLANT
PLATE 45XATL VS ELT (Plate) IMPLANT
RUBBERBAND STERILE (MISCELLANEOUS) ×6 IMPLANT
SCREW ST 13X4XST VA NS SPNE (Screw) IMPLANT
SCREW ST VAR 4 ATL (Screw) ×18 IMPLANT
SPONGE INTESTINAL PEANUT (DISPOSABLE) ×3 IMPLANT
SPONGE SURGIFOAM ABS GEL SZ50 (HEMOSTASIS) ×3 IMPLANT
STRIP CLOSURE SKIN 1/2X4 (GAUZE/BANDAGES/DRESSINGS) ×2 IMPLANT
SUT VIC AB 3-0 SH 8-18 (SUTURE) ×3 IMPLANT
SUT VIC AB 4-0 RB1 18 (SUTURE) ×3 IMPLANT
TAPE CLOTH 4X10 WHT NS (GAUZE/BANDAGES/DRESSINGS) ×3 IMPLANT
TAPE CLOTH SURG 4X10 WHT LF (GAUZE/BANDAGES/DRESSINGS) ×2 IMPLANT
TOWEL GREEN STERILE (TOWEL DISPOSABLE) ×3 IMPLANT
TOWEL GREEN STERILE FF (TOWEL DISPOSABLE) ×3 IMPLANT
TRAP SPECIMEN MUCOUS 40CC (MISCELLANEOUS) ×3 IMPLANT
WATER STERILE IRR 1000ML POUR (IV SOLUTION) ×3 IMPLANT

## 2018-02-19 NOTE — Anesthesia Postprocedure Evaluation (Signed)
Anesthesia Post Note  Patient: Anthony [name].  Procedure(s) Performed: ANTERIOR CERVICAL DECOMPRESSION/DISCECTOMY FUSION - CERVICAL FOUR-CERVICAL FIVE - CERVICAL FIVE-CERVICAL SIX (N/A Spine Cervical)     Patient location during evaluation: PACU Anesthesia Type: General Level of consciousness: sedated Pain management: pain level controlled Vital Signs Assessment: post-procedure vital signs reviewed and stable Respiratory status: spontaneous breathing and respiratory function stable Cardiovascular status: stable Postop Assessment: no apparent nausea or vomiting Anesthetic complications: no    Last Vitals:  Vitals:   02/19/18 1515 02/19/18 1530  BP: (!) 146/87 (!) 146/75  Pulse: 70 72  Resp: 13 16  Temp:  36.7 C  SpO2: 96% 96%    Last Pain:  Vitals:   02/19/18 1530  TempSrc:   PainSc: 4                  Jayten Gabbard DANIEL

## 2018-02-19 NOTE — Op Note (Signed)
Date of procedure: 02/19/2018  Date of dictation: Same  Service: Neurosurgery  Preoperative diagnosis: C4-5, C5-6 stenosis with myelopathy  Postoperative diagnosis: Same  Procedure Name: C4-5, C5-6 anterior cervical discectomy with interbody fusion utilizing interbody peek cages, locally harvested autograft, and anterior plate instrumentation  Surgeon:Marlen Koman A.Antavius Sperbeck, M.D.  Asst. Surgeon: Arnoldo Morale  Anesthesia: General  Indication: 67 year old male with progressive neck and predominantly left upper extremity symptoms including significant shoulder abduction weakness and loss of fine motor control and sensation in his left distal upper extremity.  Work-up demonstrates evidence of significant spondylosis with stenosis and spinal cord compression at C4-5 and C5-6.  Patient also with evidence of dynamic instability at C5-6.  Patient presents now for 2 level anterior cervical decompression and fusion in hopes of improving his symptoms.  Operative note: After induction of anesthesia, patient position supine with neck slightly extended and held in place of Holter traction.  Patient's anterior cervical region prepped and draped sterilely.  Incision made overlying C5.  Dissection performed on the right.  Retractor placed.  Fluoroscopy used.  Levels confirmed.  Disc spaces at C4-5 and C5-6 incised and discectomies then performed using various instruments down to level the posterior annulus.  Microscope was then brought to the field used throughout the remainder of the discectomy.  Remaining aspects of annulus and osteophytes removed using high-speed drill down to level the posterior logical limb.  Posterior logical is not elevated and resected in a piecemeal fashion.  Underlying thecal sac was identified.  Wide central decompression was then performed undercutting the bodies of C4 and C5.  Decompression then proceeded into each neural foramen.  Wide anterior foraminotomies were performed on the course exiting C5  nerve root bilaterally.  At this point a very thorough decompression of been achieved.  There was no evidence of injury to the thecal sac or nerve roots.  Procedure was then repeated at C5-6 again without complications.  Wound was then irrigated with antibiotic solution.  Gelfoam was placed topically for hemostasis.  Wound was then inspected.  Gelfoam was removed.  [employer]onic anatomic peek cages packed with locally harvested autograft was then impacted in place and recessed slightly from the anterior cortical margin at both levels.  Medtronic Atlantis anterior cervical plate was then placed over the C4 from C5 and C6 levels.  This then attached under fluoroscopic guidance using 13 mm variable angle screws.  All 6 screws given final tightening found to be solidly within the bone.  Locking screws were engaged at all 3 levels.  Final images reveal good position of the cages and the hardware at the proper operative level with normal alignment of the spine.  Wound is irrigated one final time.  Hemostasis was assured with the bipolar cautery.  Wounds and closed in layers of Vicryl sutures.  Steri-Strips and sterile dressing were applied.  No apparent complications.  Patient tolerated the procedure well and he returns to the recovery room postop.

## 2018-02-19 NOTE — Anesthesia Procedure Notes (Addendum)
Procedure Name: Intubation Date/Time: 02/19/2018 12:43 PM Performed by: Eligha Bridegroom, CRNA Pre-anesthesia Checklist: Patient identified, Emergency Drugs available, Suction available, Patient being monitored and Timeout performed Patient Re-evaluated:Patient Re-evaluated prior to induction Oxygen Delivery Method: Circle system utilized Preoxygenation: Pre-oxygenation with 100% oxygen Induction Type: IV induction Ventilation: Mask ventilation without difficulty Laryngoscope Size: Mac and 4 Grade View: Grade II Tube type: Oral Tube size: 7.5 mm Airway Equipment and Method: Stylet Placement Confirmation: ETT inserted through vocal cords under direct vision,  positive ETCO2 and breath sounds checked- equal and bilateral Secured at: 22 cm Tube secured with: Tape Dental Injury: Teeth and Oropharynx as per pre-operative assessment

## 2018-02-19 NOTE — Brief Op Note (Signed)
02/19/2018  2:15 PM  PATIENT:  Anthony [name].  67 y.o. male  PRE-OPERATIVE DIAGNOSIS:  Stenosis  POST-OPERATIVE DIAGNOSIS:  Stenosis  PROCEDURE:  Procedure(s): ANTERIOR CERVICAL DECOMPRESSION/DISCECTOMY FUSION - CERVICAL FOUR-CERVICAL FIVE - CERVICAL FIVE-CERVICAL SIX (N/A)  SURGEON:  Surgeon(s) and Role:    * Earnie Larsson, MD - Primary    * Newman Pies, MD - Assisting  PHYSICIAN ASSISTANT:   ASSISTANTS:    ANESTHESIA:   general  EBL:  50 cc   BLOOD ADMINISTERED:none  DRAINS: none   LOCAL MEDICATIONS USED:  NONE  SPECIMEN:  No Specimen  DISPOSITION OF SPECIMEN:  N/A  COUNTS:  YES  TOURNIQUET:  * No tourniquets in log *  DICTATION: .Dragon Dictation  PLAN OF CARE: Admit to inpatient   PATIENT DISPOSITION:  PACU - hemodynamically stable.   Delay start of Pharmacological VTE agent (>24hrs) due to surgical blood loss or risk of bleeding: yes

## 2018-02-19 NOTE — Anesthesia Preprocedure Evaluation (Signed)
Anesthesia Evaluation  Patient identified by MRN, date of birth, ID band Patient awake    Reviewed: Allergy & Precautions, NPO status , Patient's Chart, lab work & pertinent test results  History of Anesthesia Complications (+) history of anesthetic complications  Airway Mallampati: II  TM Distance: >3 FB Neck ROM: Full    Dental  (+) Teeth Intact, Dental Advisory Given, Caps   Pulmonary neg pulmonary ROS,    breath sounds clear to auscultation       Cardiovascular hypertension, Pt. on medications  Rhythm:Regular Rate:Normal     Neuro/Psych PSYCHIATRIC DISORDERS Depression  Neuromuscular disease    GI/Hepatic Neg liver ROS, hiatal hernia, GERD  ,  Endo/Other  Morbid obesity  Renal/GU negative Renal ROS     Musculoskeletal   Abdominal   Peds  Hematology   Anesthesia Other Findings   Reproductive/Obstetrics                             Anesthesia Physical  Anesthesia Plan  ASA: III  Anesthesia Plan: General   Post-op Pain Management:  Regional for Post-op pain   Induction:   PONV Risk Score and Plan: 3 and Ondansetron, Dexamethasone and Scopolamine patch - Pre-op  Airway Management Planned: Oral ETT  Additional Equipment:   Intra-op Plan:   Post-operative Plan: Extubation in OR  Informed Consent: I have reviewed the patients History and Physical, chart, labs and discussed the procedure including the risks, benefits and alternatives for the proposed anesthesia with the patient or authorized representative who has indicated his/her understanding and acceptance.   Dental advisory given  Plan Discussed with: CRNA and Anesthesiologist  Anesthesia Plan Comments:         Anesthesia Quick Evaluation

## 2018-02-19 NOTE — H&P (Signed)
Anthony Fariss. is an 67 y.o. male.   Chief Complaint: Neck pain HPI: 67 year old male with progressive neck pain with radiation into his left shoulder with accompanying weakness and sensory loss.  Patient has also had progressive loss of dexterity in his left upper extremity and hand.  He has some intermittent numbness and tingling in his right upper extremity.  He has no lower extremity dysfunction.  He has no bowel or bladder dysfunction.  Work-up demonstrates evidence of significant cervical stenosis with cord compression and marked neuroforaminal narrowing left greater than right at both C4-5 and C5-6.  Patient presents now for 2 level anterior cervical decompression and fusion in hopes of improving his symptoms.  Past Medical History:  Diagnosis Date  . Benign neoplasm of colon   . Benign neoplasm of skin of upper limb, including shoulder   . Carpal tunnel syndrome   . Chest pain    a. normal cors by cath in 2015  . Diaphragmatic hernia without mention of obstruction or gangrene   . Dyslipidemia   . Elevated prostate specific antigen (PSA)   . GERD (gastroesophageal reflux disease)   . Herpes zoster without mention of complication   . History of hiatal hernia   . Hypertension   . Hypertension   . Hypertrophy of prostate with urinary obstruction and other lower urinary tract symptoms (LUTS)   . Impotence of organic origin   . Lumbago   . Obesity, unspecified   . Osteoarthrosis, unspecified whether generalized or localized, lower leg   . Other and unspecified hyperlipidemia   . Other malaise and fatigue   . Other seborrheic keratosis   . Special screening for malignant neoplasm of prostate   . Stenosis, cervical spine   . Unspecified tinnitus     Past Surgical History:  Procedure Laterality Date  . CARDIOVASCULAR STRESS TEST  06/15/2012   Non-diagnostic for ischemia. No lexiscan EKG changes.  . CHOLECYSTECTOMY  1992  . EYE SURGERY    . eyeband  2006   removal of right  eyeband  . INGUINAL HERNIA REPAIR  1984   left  . INTRAOCULAR LENS REMOVAL  2008   right  . LEFT HEART CATHETERIZATION WITH CORONARY ANGIOGRAM N/A 09/05/2013   Procedure: LEFT HEART CATHETERIZATION WITH CORONARY ANGIOGRAM;  Surgeon: Troy Sine, MD;  Location: [facility] CATH LAB;  Service: Cardiovascular;  Laterality: N/A;  . LOWER VENOUS EXTREMITY DOPPLER  01/26/2011   No evidence of thrombus or thrombophlebitis.  Marland Kitchen RETINAL DETACHMENT SURGERY  2001   right  . TOTAL HIP ARTHROPLASTY  2012   bilateral  . TOTAL KNEE ARTHROPLASTY  2011, 2012   bilateral  . TRANSTHORACIC ECHOCARDIOGRAM  10/05/2009   EF >55%, normal LV size, systolic, and diastolic function.    Family History  Problem Relation Age of Onset  . Diabetes Mother   . Arrhythmia Mother   . Hypertension Mother   . Dementia Father   . Pulmonary embolism Father   . Hypertension Father   . Cancer Sister        Liver cancer - Histocytic lymphoma  . Cancer Sister 53       Skin cancer  . Hypertension Sister 48  . Diabetes Sister   . Stroke Brother   . Diabetes Brother   . Hypertension Brother   . Cancer Daughter   . Cancer Maternal Grandmother        Skin cancer  . Heart attack Maternal Grandfather   . Cancer Paternal Grandfather   .  Hypertension Paternal Grandfather    Social History:  reports that he has never smoked. He has never used smokeless tobacco. He reports that he does not drink alcohol or use drugs.  Allergies:  Allergies  Allergen Reactions  . Lipitor [Atorvastatin] Other (See Comments)    Leg pain/cramps  . Red Yeast Rice [Cholestin] Other (See Comments)    Makes legs hurt  . Statins Other (See Comments)    "muscle break down"  . Codeine Other (See Comments)    Headache  . Latex Rash    Medications Prior to Admission  Medication Sig Dispense Refill  . amLODipine (NORVASC) 10 MG tablet Take 1 tablet (10 mg total) by mouth every morning. 90 tablet 3  . benazepril (LOTENSIN) 40 MG tablet TAKE 1 TABLET  BY MOUTH EVERY DAY IN THE MORNING 90 tablet 1  . brimonidine (ALPHAGAN P) 0.1 % SOLN Place 1 drop into the right eye 2 (two) times daily.    . dorzolamide-timolol (COSOPT) 22.3-6.8 MG/ML ophthalmic solution Place 2 drops into the right eye 2 (two) times daily.     Marland Kitchen esomeprazole (NEXIUM) 40 MG capsule TAKE 1 CAPSULE (40 MG TOTAL) BY MOUTH AT BEDTIME. FOR REFLUX 90 capsule 1  . ezetimibe (ZETIA) 10 MG tablet Take 1 tablet (10 mg total) by mouth every morning. 90 tablet 3  . famotidine (PEPCID) 20 MG tablet One at bedtime to reduce stomach acid (Patient taking differently: Take 20 mg by mouth at bedtime. ) 30 tablet 5  . finasteride (PROSCAR) 5 MG tablet TAKE 1 TABLET (5 MG TOTAL) BY MOUTH EVERY MORNING. 90 tablet 1  . hydrochlorothiazide (HYDRODIURIL) 25 MG tablet Take 1 tablet (25 mg total) by mouth every morning. 90 tablet 3  . HYDROcodone-acetaminophen (NORCO/VICODIN) 5-325 MG tablet Take 1 tablet by mouth every 6 (six) hours as needed for moderate pain.    Marland Kitchen loratadine (CLARITIN) 10 MG tablet Take 10 mg by mouth daily.    . Polyvinyl Alcohol-Povidone (REFRESH OP) Place 1 drop into the right eye daily as needed (dry eyes/irritation).    Marland Kitchen aspirin EC 81 MG tablet Take 81 mg by mouth every morning.    . nitroGLYCERIN (NITROSTAT) 0.4 MG SL tablet Place 1 tablet (0.4 mg total) under the tongue every 5 (five) minutes as needed for chest pain. 25 tablet 2    Results for orders placed or performed during the hospital encounter of 02/19/18 (from the past 48 hour(s))  CBC     Status: None   Collection Time: 02/19/18 10:01 AM  Result Value Ref Range   WBC 6.7 4.0 - 10.5 K/uL   RBC 4.95 4.22 - 5.81 MIL/uL   Hemoglobin 14.6 13.0 - 17.0 g/dL   HCT 43.0 39.0 - 52.0 %   MCV 86.9 78.0 - 100.0 fL   MCH 29.5 26.0 - 34.0 pg   MCHC 34.0 30.0 - 36.0 g/dL   RDW 12.6 11.5 - 15.5 %   Platelets 188 150 - 400 K/uL    Comment: Performed at [facility], [place] [address], [place], [place] [address]  Basic  metabolic panel     Status: Abnormal   Collection Time: 02/19/18 10:01 AM  Result Value Ref Range   Sodium 140 135 - 145 mmol/L   Potassium 3.4 (L) 3.5 - 5.1 mmol/L   Chloride 107 98 - 111 mmol/L    Comment: Please note change in reference range.   CO2 25 22 - 32 mmol/L   Glucose, Bld  100 (H) 70 - 99 mg/dL    Comment: Please note change in reference range.   BUN 9 8 - 23 mg/dL    Comment: Please note change in reference range.   Creatinine, Ser 0.92 0.61 - 1.24 mg/dL   Calcium 9.1 8.9 - 10.3 mg/dL   GFR calc non Af Amer >60 >60 mL/min   GFR calc Af Amer >60 >60 mL/min    Comment: (NOTE) The eGFR has been calculated using the CKD EPI equation. This calculation has not been validated in all clinical situations. eGFR's persistently <60 mL/min signify possible Chronic Kidney Disease.    Anion gap 8 5 - 15    Comment: Performed at [facility] [address], [place], [place] [address]   No results found.  Pertinent items noted in HPI and remainder of comprehensive ROS otherwise negative.  Blood pressure (!) 160/80, pulse 62, temperature 97.7 F (36.5 C), temperature source Oral, resp. rate 20, height '5\' 10"'  (1.778 m), weight 117 kg (258 lb), SpO2 97 %.  Patient is awake and alert.  He is oriented and appropriate.  Speech is fluent.  Judgment and insight are intact.  Cranial nerve function normal bilaterally.  Motor examination reveals weakness of his left deltoid muscle group grading out at 4-/5.  He has weakness of his left biceps muscle group grading out of 4/5.  He has some weakness involving his left intrinsics and grip in his left hand grading out at 3/5.  Right upper extremity strength is normal.  Lower extremity strength is normal.  Reflexes are slightly brisk.  No evidence of long track signs.  Examination head ears eyes nose throat is unremarkable her chest and abdomen are benign.  Extremities are free from injury deformity. Assessment/Plan C4-5, C5-6 stenosis with  myelopathy.  Plan C4-5 and C5-6 anterior cervical discectomy with interbody fusion utilizing interbody peek cages, locally harvested autograft, and anterior plate instrumentation.  Risks and benefits of been explained.  Patient wishes to proceed.  Mallie Mussel A Ying Blankenhorn 02/19/2018, 11:38 AM

## 2018-02-19 NOTE — Transfer of Care (Signed)
Immediate Anesthesia Transfer of Care Note  Patient: Anthony [name].  Procedure(s) Performed: ANTERIOR CERVICAL DECOMPRESSION/DISCECTOMY FUSION - CERVICAL FOUR-CERVICAL FIVE - CERVICAL FIVE-CERVICAL SIX (N/A Spine Cervical)  Patient Location: PACU  Anesthesia Type:General  Level of Consciousness: awake, alert  and oriented  Airway & Oxygen Therapy: Patient Spontanous Breathing  Post-op Assessment: Report given to RN and Post -op Vital signs reviewed and stable  Post vital signs: Reviewed and stable  Last Vitals:  Vitals Value Taken Time  BP 135/75 02/19/2018  2:29 PM  Temp 36.5 C 02/19/2018  2:29 PM  Pulse 76 02/19/2018  2:35 PM  Resp 19 02/19/2018  2:35 PM  SpO2 93 % 02/19/2018  2:35 PM  Vitals shown include unvalidated device data.  Last Pain:  Vitals:   02/19/18 1429  TempSrc:   PainSc: Asleep      Patients Stated Pain Goal: 4 (97/94/80 1655)  Complications: No apparent anesthesia complications

## 2018-02-19 NOTE — Progress Notes (Signed)
Orthopedic Tech Progress Note Patient Details:  Anthony [name] 07-10-1951 [id number]  Ortho Devices Type of Ortho Device: Soft collar Ortho Device/Splint Location: neck Ortho Device/Splint Interventions: Application   Post Interventions Patient Tolerated: Well Instructions Provided: Care of device   [name] 02/19/2018, 3:18 PM

## 2018-02-20 MED ORDER — CYCLOBENZAPRINE HCL 10 MG PO TABS: 10.0000 mg | ORAL_TABLET | Freq: Three times a day (TID) | ORAL | 0 refills | Status: DC | PRN

## 2018-02-20 MED ORDER — HYDROCODONE-ACETAMINOPHEN 5-325 MG PO TABS: 1.0000 | ORAL_TABLET | ORAL | 0 refills | Status: DC | PRN

## 2018-02-20 NOTE — Discharge Summary (Signed)
Physician Discharge Summary  Patient ID: Anthony [name]. MRN: [id number] DOB/AGE: 1951/04/28 67 y.o.  Admit date: 02/19/2018 Discharge date: 02/20/2018  Admission Diagnoses:  Discharge Diagnoses:  Active Problems:   Cervical myelopathy [facility])   Discharged Condition: good  Hospital Course: Patient admitted to the hospital where he underwent an uncomplicated 2 level anterior cervical decompression and fusion.  Postoperatively he is doing very well.  Preoperative left upper extremity pain and weakness resolved.  Ambulating without difficulty.  Tolerating a regular diet.  Ready for discharge home.  Consults:   Significant Diagnostic Studies:   Treatments:   Discharge Exam: Blood pressure (!) 147/77, pulse (!) 59, temperature 97.9 F (36.6 C), temperature source Oral, resp. rate 18, height 5\' 10"  (1.778 m), weight 117 kg (258 lb), SpO2 95 %. So patient is awake and alert.  He is oriented and appropriate.  Speech is fluent.  His voice is strong.  His wound is healing well.  Neck is soft.  Motor examination 5/5 bilaterally.  Sensory examination intact.  Chest and abdomen benign.  Disposition: Discharge disposition: 01-Home or Self Care        Allergies as of 02/20/2018      Reactions   Lipitor [atorvastatin] Other (See Comments)   Leg pain/cramps   Red Yeast Rice [cholestin] Other (See Comments)   Makes legs hurt   Statins Other (See Comments)   "muscle break down"   Codeine Other (See Comments)   Headache   Latex Rash      Medication List    TAKE these medications   amLODipine 10 MG tablet Commonly known as:  NORVASC Take 1 tablet (10 mg total) by mouth every morning.   aspirin EC 81 MG tablet Take 81 mg by mouth every morning.   benazepril 40 MG tablet Commonly known as:  LOTENSIN TAKE 1 TABLET BY MOUTH EVERY DAY IN THE MORNING   brimonidine 0.1 % Soln Commonly known as:  ALPHAGAN P Place 1 drop into the right eye 2 (two) times daily.   cyclobenzaprine 10 MG  tablet Commonly known as:  FLEXERIL Take 1 tablet (10 mg total) by mouth 3 (three) times daily as needed for muscle spasms.   dorzolamide-timolol 22.3-6.8 MG/ML ophthalmic solution Commonly known as:  COSOPT Place 2 drops into the right eye 2 (two) times daily.   esomeprazole 40 MG capsule Commonly known as:  NEXIUM TAKE 1 CAPSULE (40 MG TOTAL) BY MOUTH AT BEDTIME. FOR REFLUX   ezetimibe 10 MG tablet Commonly known as:  ZETIA Take 1 tablet (10 mg total) by mouth every morning.   famotidine 20 MG tablet Commonly known as:  PEPCID One at bedtime to reduce stomach acid What changed:    how much to take  how to take this  when to take this  additional instructions   finasteride 5 MG tablet Commonly known as:  PROSCAR TAKE 1 TABLET (5 MG TOTAL) BY MOUTH EVERY MORNING.   hydrochlorothiazide 25 MG tablet Commonly known as:  HYDRODIURIL Take 1 tablet (25 mg total) by mouth every morning.   HYDROcodone-acetaminophen 5-325 MG tablet Commonly known as:  NORCO/VICODIN Take 1 tablet by mouth every 6 (six) hours as needed for moderate pain. What changed:  Another medication with the same name was added. Make sure you understand how and when to take each.   HYDROcodone-acetaminophen 5-325 MG tablet Commonly known as:  NORCO/VICODIN Take 1-2 tablets by mouth every 4 (four) hours as needed for moderate pain ((score 4 to  6)). What changed:  You were already taking a medication with the same name, and this prescription was added. Make sure you understand how and when to take each.   loratadine 10 MG tablet Commonly known as:  CLARITIN Take 10 mg by mouth daily.   nitroGLYCERIN 0.4 MG SL tablet Commonly known as:  NITROSTAT Place 1 tablet (0.4 mg total) under the tongue every 5 (five) minutes as needed for chest pain.   REFRESH OP Place 1 drop into the right eye daily as needed (dry eyes/irritation).        Signed: Cooper Render Robt Okuda 02/20/2018, 8:10 AM

## 2018-02-20 NOTE — Discharge Instructions (Signed)

## 2018-02-20 NOTE — Progress Notes (Signed)
Discharge instructions reviewed with patient/family. All questions answered at this time. RX given. Transport home by family.   Noble Bodie, RN 

## 2018-02-21 ENCOUNTER — Encounter (HOSPITAL_COMMUNITY): Payer: Self-pay | Admitting: Neurosurgery

## 2018-02-26 ENCOUNTER — Other Ambulatory Visit: Payer: PPO

## 2018-03-01 ENCOUNTER — Ambulatory Visit: Payer: PPO | Admitting: Internal Medicine

## 2018-03-02 ENCOUNTER — Other Ambulatory Visit: Payer: Self-pay | Admitting: Internal Medicine

## 2018-03-22 DIAGNOSIS — G992 Myelopathy in diseases classified elsewhere: Secondary | ICD-10-CM | POA: Diagnosis not present

## 2018-03-29 DIAGNOSIS — H40053 Ocular hypertension, bilateral: Secondary | ICD-10-CM | POA: Diagnosis not present

## 2018-04-17 DIAGNOSIS — Z96642 Presence of left artificial hip joint: Secondary | ICD-10-CM | POA: Diagnosis not present

## 2018-04-17 DIAGNOSIS — Z96652 Presence of left artificial knee joint: Secondary | ICD-10-CM | POA: Diagnosis not present

## 2018-04-17 DIAGNOSIS — M7062 Trochanteric bursitis, left hip: Secondary | ICD-10-CM | POA: Diagnosis not present

## 2018-04-17 DIAGNOSIS — Z96651 Presence of right artificial knee joint: Secondary | ICD-10-CM | POA: Diagnosis not present

## 2018-04-17 DIAGNOSIS — M25562 Pain in left knee: Secondary | ICD-10-CM | POA: Diagnosis not present

## 2018-04-18 DIAGNOSIS — M7632 Iliotibial band syndrome, left leg: Secondary | ICD-10-CM | POA: Diagnosis not present

## 2018-04-18 DIAGNOSIS — M7062 Trochanteric bursitis, left hip: Secondary | ICD-10-CM | POA: Diagnosis not present

## 2018-04-18 DIAGNOSIS — Z96652 Presence of left artificial knee joint: Secondary | ICD-10-CM | POA: Diagnosis not present

## 2018-04-19 DIAGNOSIS — M7632 Iliotibial band syndrome, left leg: Secondary | ICD-10-CM | POA: Diagnosis not present

## 2018-04-19 DIAGNOSIS — G992 Myelopathy in diseases classified elsewhere: Secondary | ICD-10-CM | POA: Diagnosis not present

## 2018-04-19 DIAGNOSIS — M7062 Trochanteric bursitis, left hip: Secondary | ICD-10-CM | POA: Diagnosis not present

## 2018-04-19 DIAGNOSIS — Z96652 Presence of left artificial knee joint: Secondary | ICD-10-CM | POA: Diagnosis not present

## 2018-04-24 DIAGNOSIS — M7632 Iliotibial band syndrome, left leg: Secondary | ICD-10-CM | POA: Diagnosis not present

## 2018-04-24 DIAGNOSIS — M7062 Trochanteric bursitis, left hip: Secondary | ICD-10-CM | POA: Diagnosis not present

## 2018-04-24 DIAGNOSIS — Z96652 Presence of left artificial knee joint: Secondary | ICD-10-CM | POA: Diagnosis not present

## 2018-04-26 DIAGNOSIS — Z96652 Presence of left artificial knee joint: Secondary | ICD-10-CM | POA: Diagnosis not present

## 2018-04-26 DIAGNOSIS — M7632 Iliotibial band syndrome, left leg: Secondary | ICD-10-CM | POA: Diagnosis not present

## 2018-04-26 DIAGNOSIS — M7062 Trochanteric bursitis, left hip: Secondary | ICD-10-CM | POA: Diagnosis not present

## 2018-04-30 ENCOUNTER — Ambulatory Visit (INDEPENDENT_AMBULATORY_CARE_PROVIDER_SITE_OTHER): Payer: PPO | Admitting: Internal Medicine

## 2018-04-30 ENCOUNTER — Encounter: Payer: Self-pay | Admitting: Internal Medicine

## 2018-04-30 VITALS — BP 126/74 | HR 73 | Temp 98.7°F | Ht 70.0 in | Wt 268.2 lb

## 2018-04-30 DIAGNOSIS — R221 Localized swelling, mass and lump, neck: Secondary | ICD-10-CM

## 2018-04-30 DIAGNOSIS — K122 Cellulitis and abscess of mouth: Secondary | ICD-10-CM

## 2018-04-30 MED ORDER — AMOXICILLIN-POT CLAVULANATE 875-125 MG PO TABS: 1.0000 | ORAL_TABLET | Freq: Two times a day (BID) | ORAL | 0 refills | Status: DC

## 2018-04-30 NOTE — Patient Instructions (Addendum)
Please keep a copy of this record as you travel.  If there is recurrence of swelling intraorally or of the neck; it is imperative that you go to the emergency room. Take the antibiotic after a full meal.Please take a probiotic , Florastor OR Align, every day if the bowels are loose. This will replace the normal bacteria which  are necessary for formation of normal stool and processing of food.

## 2018-04-30 NOTE — Progress Notes (Signed)
Anthony [name], Anthony [name] DOB 28-Jul-1951  This is a [employer] office visit  for specific acute issue of intraoral and neck swelling.  Interim medical record and care since last [place] visit was updated with review of diagnostic studies and change in clinical status since last visit were documented.  HPI: On Saturday, 04/28/2018 he noted soreness under his tongue on the left while drinking a milkshake.  This was @ approximately 4:30 PM, he had not had any other intake since midday.  He remembers no intraoral trauma from  fish bones or other particulate matter.  1 hour after he noticed the soreness he noted some swelling and lymph node enlargement in the left submandibular area associated with marked swelling of the anterior neck.  Without treatment the swelling did improve over the next 24 hours.  Initially discomfort was up to a level 10.  By 9/8 afternoon he stated the level of discomfort was a 6.  He has residual soreness under the tongue on the left as well as a left submandibular area; but the anterior neck swelling has improved dramatically.  He has had slight left ear discomfort and hoarseness but has no other extrinsic, upper respiratory tract , or new lower respiratory tract symptoms .  He denied any respiratory compromise. Significantly he is on an ACE inhibitor, benazepril 40 mg daily which he takes each morning.  He has not noted any numbness, tingling, swelling of his lips or tongue.  He took his ACE inhibitor this morning w/o symptoms. 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  This is the main reason he came in today  He had neurosurgery 02/19/2018 with fusion of C4-5-6 by Dr. Trenton Gammon.  He was seen last week, imaging performed, and he was released.  He has had a chronic cough since surgery with minor sputum production. He has a history of bilateral cataract surgery as well as detached retina on the right.  He is also being treated for glaucoma. He states his wife  describes snoring, he denies a history of sleep apnea.  Constitutional: No fever, significant weight change, fatigue  Eyes: No redness, discharge, pain, vision change ENT/mouth: No nasal congestion,  purulent discharge, change in hearing  Cardiovascular: No chest pain, palpitations, paroxysmal nocturnal dyspnea, claudication, edema  Respiratory: No hemoptysis, DOE,  apnea   Musculoskeletal: No joint stiffness, joint swelling, weakness, pain Dermatologic: No rash, pruritus, change in appearance of skin Neurologic: No  Headache Hematologic/lymphatic: No significant bruising, lymphadenopathy, abnormal bleeding Allergy/immunology: No itchy/watery eyes, significant sneezing, urticaria, definite angioedema  Physical exam:  Pertinent or positive findings: Bilateral ptosis is present, slightly greater on the right.  The right pupil is asymmetrically located and asymmetric in contour.  There is erythema of the nares, right greater than left.  There is subtle asymmetric soft tissue swelling under the left tongue without evidence of pustule or cellulitis.  This area is tender to palpation.  The oropharynx is crowded without definite airway compromise.TM's slightly dull.   There is a 3 x 3 cm left submandibular lymph node which is also tender. There were no abnormal sounds over the neck with hyperventilation.  There is a small (8X8 mm) slightly erythematous papule of the right lateral neck without definite cellulitis or pustule formation.  General appearance: Adequately nourished; no acute distress, increased work of breathing is present.   Lymphatic: No lymphadenopathy of axilla. Eyes: No conjunctival inflammation or lid edema is present. There is no scleral  icterus. Ears:  External ear exam shows no significant lesions or deformities.  Nose:  External nasal examination shows no deformity or inflammation. Nasal mucosa are pink and moist without lesions, exudates Oral exam:  Lips and gums are healthy  appearing. There is no oropharyngeal erythema or exudate. Neck:  No thyromegaly, masses, tenderness noted.    Heart:  Normal rate and regular rhythm. S1 and S2 normal without gallop, murmur, click, rub .  Lungs: Chest clear to auscultation without wheezes, rhonchi, rales, rubs. Extremities:  No cyanosis, clubbing, edema  Skin: Warm & dry w/o tenting. No significant  rash.  See summary under each active problem in the Problem List with associated updated therapeutic plan

## 2018-05-08 ENCOUNTER — Other Ambulatory Visit: Payer: Self-pay | Admitting: Internal Medicine

## 2018-05-15 DIAGNOSIS — M7062 Trochanteric bursitis, left hip: Secondary | ICD-10-CM | POA: Diagnosis not present

## 2018-05-15 DIAGNOSIS — M7632 Iliotibial band syndrome, left leg: Secondary | ICD-10-CM | POA: Diagnosis not present

## 2018-05-15 DIAGNOSIS — Z96652 Presence of left artificial knee joint: Secondary | ICD-10-CM | POA: Diagnosis not present

## 2018-05-17 DIAGNOSIS — Z96652 Presence of left artificial knee joint: Secondary | ICD-10-CM | POA: Diagnosis not present

## 2018-05-17 DIAGNOSIS — M7632 Iliotibial band syndrome, left leg: Secondary | ICD-10-CM | POA: Diagnosis not present

## 2018-05-17 DIAGNOSIS — M7062 Trochanteric bursitis, left hip: Secondary | ICD-10-CM | POA: Diagnosis not present

## 2018-05-22 DIAGNOSIS — M7632 Iliotibial band syndrome, left leg: Secondary | ICD-10-CM | POA: Diagnosis not present

## 2018-05-22 DIAGNOSIS — M7062 Trochanteric bursitis, left hip: Secondary | ICD-10-CM | POA: Diagnosis not present

## 2018-05-22 DIAGNOSIS — Z96652 Presence of left artificial knee joint: Secondary | ICD-10-CM | POA: Diagnosis not present

## 2018-05-24 DIAGNOSIS — M7632 Iliotibial band syndrome, left leg: Secondary | ICD-10-CM | POA: Diagnosis not present

## 2018-05-24 DIAGNOSIS — Z96652 Presence of left artificial knee joint: Secondary | ICD-10-CM | POA: Diagnosis not present

## 2018-05-24 DIAGNOSIS — M7062 Trochanteric bursitis, left hip: Secondary | ICD-10-CM | POA: Diagnosis not present

## 2018-05-29 DIAGNOSIS — M7062 Trochanteric bursitis, left hip: Secondary | ICD-10-CM | POA: Diagnosis not present

## 2018-05-29 DIAGNOSIS — Z96652 Presence of left artificial knee joint: Secondary | ICD-10-CM | POA: Diagnosis not present

## 2018-05-29 DIAGNOSIS — M7632 Iliotibial band syndrome, left leg: Secondary | ICD-10-CM | POA: Diagnosis not present

## 2018-05-31 DIAGNOSIS — Z96652 Presence of left artificial knee joint: Secondary | ICD-10-CM | POA: Diagnosis not present

## 2018-05-31 DIAGNOSIS — M7632 Iliotibial band syndrome, left leg: Secondary | ICD-10-CM | POA: Diagnosis not present

## 2018-05-31 DIAGNOSIS — M7062 Trochanteric bursitis, left hip: Secondary | ICD-10-CM | POA: Diagnosis not present

## 2018-06-05 DIAGNOSIS — M7632 Iliotibial band syndrome, left leg: Secondary | ICD-10-CM | POA: Diagnosis not present

## 2018-06-05 DIAGNOSIS — M7062 Trochanteric bursitis, left hip: Secondary | ICD-10-CM | POA: Diagnosis not present

## 2018-06-05 DIAGNOSIS — Z96652 Presence of left artificial knee joint: Secondary | ICD-10-CM | POA: Diagnosis not present

## 2018-06-07 DIAGNOSIS — M7632 Iliotibial band syndrome, left leg: Secondary | ICD-10-CM | POA: Diagnosis not present

## 2018-06-07 DIAGNOSIS — M7062 Trochanteric bursitis, left hip: Secondary | ICD-10-CM | POA: Diagnosis not present

## 2018-06-07 DIAGNOSIS — Z96652 Presence of left artificial knee joint: Secondary | ICD-10-CM | POA: Diagnosis not present

## 2018-06-12 DIAGNOSIS — M7632 Iliotibial band syndrome, left leg: Secondary | ICD-10-CM | POA: Diagnosis not present

## 2018-06-12 DIAGNOSIS — Z96652 Presence of left artificial knee joint: Secondary | ICD-10-CM | POA: Diagnosis not present

## 2018-06-12 DIAGNOSIS — M7062 Trochanteric bursitis, left hip: Secondary | ICD-10-CM | POA: Diagnosis not present

## 2018-06-14 DIAGNOSIS — M7632 Iliotibial band syndrome, left leg: Secondary | ICD-10-CM | POA: Diagnosis not present

## 2018-06-14 DIAGNOSIS — Z96652 Presence of left artificial knee joint: Secondary | ICD-10-CM | POA: Diagnosis not present

## 2018-06-14 DIAGNOSIS — M7062 Trochanteric bursitis, left hip: Secondary | ICD-10-CM | POA: Diagnosis not present

## 2018-06-18 ENCOUNTER — Other Ambulatory Visit: Payer: PPO

## 2018-06-18 DIAGNOSIS — E669 Obesity, unspecified: Secondary | ICD-10-CM

## 2018-06-18 DIAGNOSIS — E782 Mixed hyperlipidemia: Secondary | ICD-10-CM | POA: Diagnosis not present

## 2018-06-18 DIAGNOSIS — R739 Hyperglycemia, unspecified: Secondary | ICD-10-CM

## 2018-06-19 ENCOUNTER — Encounter: Payer: Self-pay | Admitting: Internal Medicine

## 2018-06-19 LAB — HEMOGLOBIN A1C
Hgb A1c MFr Bld: 5.3 % of total Hgb (ref <5.7)
Mean Plasma Glucose: 105 (calc)
eAG (mmol/L): 5.8 (calc)

## 2018-06-19 LAB — LIPID PANEL
Cholesterol: 162 mg/dL (ref <200)
HDL: 39 mg/dL — ABNORMAL LOW (ref >40)
LDL Cholesterol (Calc): 101 mg/dL (calc) — ABNORMAL HIGH
Non-HDL Cholesterol (Calc): 123 mg/dL (calc) (ref <130)
Total CHOL/HDL Ratio: 4.2 (calc) (ref <5.0)
Triglycerides: 123 mg/dL (ref <150)

## 2018-06-21 ENCOUNTER — Ambulatory Visit (INDEPENDENT_AMBULATORY_CARE_PROVIDER_SITE_OTHER): Payer: PPO | Admitting: Internal Medicine

## 2018-06-21 ENCOUNTER — Encounter: Payer: Self-pay | Admitting: Internal Medicine

## 2018-06-21 VITALS — BP 120/62 | HR 69 | Temp 98.2°F | Ht 70.0 in | Wt 262.0 lb

## 2018-06-21 DIAGNOSIS — Z6837 Body mass index (BMI) 37.0-37.9, adult: Secondary | ICD-10-CM

## 2018-06-21 DIAGNOSIS — K222 Esophageal obstruction: Secondary | ICD-10-CM

## 2018-06-21 DIAGNOSIS — I10 Essential (primary) hypertension: Secondary | ICD-10-CM | POA: Diagnosis not present

## 2018-06-21 DIAGNOSIS — R739 Hyperglycemia, unspecified: Secondary | ICD-10-CM | POA: Diagnosis not present

## 2018-06-21 DIAGNOSIS — E782 Mixed hyperlipidemia: Secondary | ICD-10-CM | POA: Diagnosis not present

## 2018-06-21 DIAGNOSIS — Z23 Encounter for immunization: Secondary | ICD-10-CM | POA: Diagnosis not present

## 2018-06-21 DIAGNOSIS — R1314 Dysphagia, pharyngoesophageal phase: Secondary | ICD-10-CM | POA: Diagnosis not present

## 2018-06-21 NOTE — Progress Notes (Signed)
Location:  [facility] clinic Provider:  Garlene Apperson L. Mariea Clonts, D.O., C.M.D.  Code Status: full code Goals of Care:  Advanced Directives 04/30/2018  Does Patient Have a Medical Advance Directive? Yes  Type of Advance Directive Living will  Does patient want to make changes to medical advance directive? No - Patient declined  Copy of [place] in Chart? -  Would patient like information on creating a medical advance directive? -   Chief Complaint  Patient presents with  . Medical Management of Chronic Issues    22mth follow-up on weight    HPI: Patient is a 67 y.o. male seen today for medical management of chronic diseases--f/u on weight.    He had some neck issues.  C4-6 were fused.  That set him back.  It was done July 1st.  It took three months of battling with insurance to get it done.  It's helped him a lot.  He had been having headaches and those are better, too. He was losing his hand grip--his strength is back to normal. He did a lot of sitting during that time.  Since recovering, weight has trended down 6.2 lbs from his visit 9/9.  He's quit his sweets and cokes again.  He is worried about the holidays and does not want the accountability of checking in in the coming weeks with weight checks or seeing a dietitian as I suggested.    Saw ophtho in august about glaucoma and returns in Dec.  He reports seeing pretty good.  He had laser on the right eye which helped the pressure.  Had the catarct surgery on the left--it's glazed over and he can't read as good as a result.    9/9, his throat had gotten swollen.  It did go down significantly before the visit.  He'd worn a tight collar with a tie afterwards.  Hopp gave him augmentin.  It got better.  He does have two spots under his tongue in his mouth--these appear normal and he also plans to check with his dentist about them--they match the other side.  He's had a lot of mucus, coughs a lot, it bothers his breathing.  It starts an  hour after he eats.  He feels like there's a squishy noise.  Only he can hear this and worse when he lays down.  His wife does not hear it.  He tries to avoid eating after 5:30pm.  It was worse last night than it has been.  He had an apple and grapes only last night at the beginning of the world series.  Dr. Cristina Gong wants to stretch his esophagus--area is size of a pencil eraser.  He's afraid of the risk of puncture.    Other than his joints and above, he feels pretty good.    He did PT for his left knee/IT band was very tight.  Had good improvement with those exercises.  He may need to do some on his right knee.  IF he decides he wants PT on that knee, he will call me.    Past Medical History:  Diagnosis Date  . Benign neoplasm of colon   . Benign neoplasm of skin of upper limb, including shoulder   . Carpal tunnel syndrome   . Chest pain    a. normal cors by cath in 2015  . Diaphragmatic hernia without mention of obstruction or gangrene   . Dyslipidemia   . Elevated prostate specific antigen (PSA)   . GERD (gastroesophageal  reflux disease)   . Herpes zoster without mention of complication   . History of hiatal hernia   . Hypertension   . Hypertrophy of prostate with urinary obstruction and other lower urinary tract symptoms (LUTS)   . Impotence of organic origin   . Lumbago   . Obesity, unspecified   . Osteoarthrosis, unspecified whether generalized or localized, lower leg   . Other and unspecified hyperlipidemia   . Other malaise and fatigue   . Other seborrheic keratosis   . Special screening for malignant neoplasm of prostate   . Stenosis, cervical spine   . Unspecified tinnitus     Past Surgical History:  Procedure Laterality Date  . ANTERIOR CERVICAL DECOMP/DISCECTOMY FUSION N/A 02/19/2018   Procedure: ANTERIOR CERVICAL DECOMPRESSION/DISCECTOMY FUSION - CERVICAL FOUR-CERVICAL FIVE - CERVICAL FIVE-CERVICAL SIX;  Surgeon: Earnie Larsson, MD;  Location: [facility];  Service:  Neurosurgery;  Laterality: N/A;  . CARDIOVASCULAR STRESS TEST  06/15/2012   Non-diagnostic for ischemia. No lexiscan EKG changes.  . CHOLECYSTECTOMY  1992  . EYE SURGERY    . eyeband  2006   removal of right eyeband  . INGUINAL HERNIA REPAIR  1984   left  . INTRAOCULAR LENS REMOVAL  2008   right  . LEFT HEART CATHETERIZATION WITH CORONARY ANGIOGRAM N/A 09/05/2013   Procedure: LEFT HEART CATHETERIZATION WITH CORONARY ANGIOGRAM;  Surgeon: Troy Sine, MD;  Location: [facility] CATH LAB;  Service: Cardiovascular;  Laterality: N/A;  . LOWER VENOUS EXTREMITY DOPPLER  01/26/2011   No evidence of thrombus or thrombophlebitis.  Marland Kitchen RETINAL DETACHMENT SURGERY  2001   right  . TOTAL HIP ARTHROPLASTY  2012   bilateral  . TOTAL KNEE ARTHROPLASTY  2011, 2012   bilateral  . TRANSTHORACIC ECHOCARDIOGRAM  10/05/2009   EF >55%, normal LV size, systolic, and diastolic function.    Allergies  Allergen Reactions  . Lipitor [Atorvastatin] Other (See Comments)    Leg pain/cramps  . Red Yeast Rice [Cholestin] Other (See Comments)    Makes legs hurt  . Statins Other (See Comments)    "muscle break down"  . Codeine Other (See Comments)    Headache  . Latex Rash    Outpatient Encounter Medications as of 06/21/2018  Medication Sig  . amLODipine (NORVASC) 10 MG tablet Take 1 tablet (10 mg total) by mouth every morning.  Marland Kitchen aspirin EC 81 MG tablet Take 81 mg by mouth every morning.  . benazepril (LOTENSIN) 40 MG tablet TAKE 1 TABLET BY MOUTH EVERY DAY IN THE MORNING  . brimonidine (ALPHAGAN P) 0.1 % SOLN Place 1 drop into the right eye 2 (two) times daily.  . dorzolamide-timolol (COSOPT) 22.3-6.8 MG/ML ophthalmic solution Place 2 drops into the right eye 2 (two) times daily.   Marland Kitchen esomeprazole (NEXIUM) 40 MG capsule TAKE 1 CAPSULE BY MOUTH AT BEDTIME. FOR REFLUX  . ezetimibe (ZETIA) 10 MG tablet Take 1 tablet (10 mg total) by mouth every morning.  . famotidine (PEPCID) 20 MG tablet One at bedtime to reduce  stomach acid  . finasteride (PROSCAR) 5 MG tablet TAKE 1 TABLET BY MOUTH EVERY DAY IN THE MORNING  . hydrochlorothiazide (HYDRODIURIL) 25 MG tablet Take 1 tablet (25 mg total) by mouth every morning.  . Polyvinyl Alcohol-Povidone (REFRESH OP) Place 1 drop into the right eye daily as needed (dry eyes/irritation).  . [DISCONTINUED] amoxicillin-clavulanate (AUGMENTIN) 875-125 MG tablet Take 1 tablet by mouth 2 (two) times daily.  . [DISCONTINUED] cyclobenzaprine (FLEXERIL) 10 MG tablet Take 1  tablet (10 mg total) by mouth 3 (three) times daily as needed for muscle spasms.  . [DISCONTINUED] nitroGLYCERIN (NITROSTAT) 0.4 MG SL tablet Place 1 tablet (0.4 mg total) under the tongue every 5 (five) minutes as needed for chest pain.   No facility-administered encounter medications on file as of 06/21/2018.     Review of Systems:  Review of Systems  Constitutional: Negative for chills, fever and malaise/fatigue.       Weight gain then loss  HENT: Negative for congestion.   Eyes:       See hpi  Respiratory: Positive for cough and sputum production. Negative for shortness of breath.   Cardiovascular: Negative for chest pain, palpitations and leg swelling.  Gastrointestinal: Positive for heartburn. Negative for abdominal pain, blood in stool, constipation, diarrhea, melena, nausea and vomiting.  Genitourinary: Negative for dysuria.  Musculoskeletal: Positive for joint pain. Negative for falls, myalgias and neck pain.  Skin: Negative for itching and rash.  Neurological: Negative for dizziness and loss of consciousness.  Psychiatric/Behavioral: Negative for depression and memory loss. The patient is not nervous/anxious and does not have insomnia.     Health Maintenance  Topic Date Due  . INFLUENZA VACCINE  03/22/2018  . TETANUS/TDAP  08/29/2021  . COLONOSCOPY  05/08/2022  . Hepatitis C Screening  Discontinued  . PNA vac Low Risk Adult  Discontinued    Physical Exam: Vitals:   06/21/18 1103    BP: 120/62  Pulse: 69  Temp: 98.2 F (36.8 C)  TempSrc: Oral  SpO2: 96%  Weight: 262 lb (118.8 kg)  Height: 5\' 10"  (1.778 m)   Body mass index is 37.59 kg/m. Physical Exam  Constitutional: He is oriented to person, place, and time. He appears well-developed and well-nourished. No distress.  obese  Cardiovascular: Normal rate, regular rhythm, normal heart sounds and intact distal pulses.  Pulmonary/Chest: Effort normal and breath sounds normal. No stridor. No respiratory distress. He has no wheezes. He has no rales.  Abdominal: Soft. Bowel sounds are normal.  Musculoskeletal: Normal range of motion.  Neurological: He is alert and oriented to person, place, and time.  Skin: Skin is warm and dry.  Bilateral TKA scars  Psychiatric: He has a normal mood and affect.    Labs reviewed: Basic Metabolic Panel: Recent Labs    10/31/17 0815 11/29/17 0806 02/19/18 1001  NA 143 140 140  K 4.0 3.8 3.4*  CL 107 106 107  CO2 30 25 25   GLUCOSE 97 110* 100*  BUN 25 19 9   CREATININE 0.98 0.95 0.92  CALCIUM 9.3 9.3 9.1   Liver Function Tests: Recent Labs    10/31/17 0815  AST 23  ALT 21  BILITOT 0.6  PROT 5.8*   No results for input(s): LIPASE, AMYLASE in the last 8760 hours. No results for input(s): AMMONIA in the last 8760 hours. CBC: Recent Labs    10/31/17 0815 11/29/17 0806 02/19/18 1001  WBC 6.9 6.7 6.7  NEUTROABS 4,188 4.3  --   HGB 15.2 15.1 14.6  HCT 43.9 44.4 43.0  MCV 86.6 87.9 86.9  PLT 195 199 188   Lipid Panel: Recent Labs    10/31/17 0815 06/18/18 0802  CHOL 171 162  HDL 41 39*  LDLCALC 107* 101*  TRIG 115 123  CHOLHDL 4.2 4.2   Lab Results  Component Value Date   HGBA1C 5.3 06/18/2018   Assessment/Plan 1. Dysphagia, pharyngoesophageal phase -f/u with Dr. Cristina Gong due to this and what seems to be increased  GERD recently since his neck surgery -counseled on conservative measures to prevent gerd  -cont same pepcid and nexium  2. Essential  hypertension -cont current hctz and benazepril - Basic metabolic panel; Future  3. Esophageal stenosis -for dilatation but he's fearful  4. Mixed hyperlipidemia - cont zetia, did not tolerate statins per records (listed in allergies) - Lipid panel; Future - Basic metabolic panel; Future  5. Morbid obesity ([place]) - r/o hypothyroidism, but seems this is due to inactivity as of late, did lose since restarting better diet and moving more again after neck surgery - TSH; Future  6. Hyperglycemia -f/u labs - Hemoglobin A1c; Future  7. BMI 37.0-37.9, adult -ongoing issue, counseled on weight loss approaches - TSH; Future  8. Need for influenza vaccination - Flu vaccine HIGH DOSE PF (Fluzone High dose)   Labs/tests ordered:   Orders Placed This Encounter  Procedures  . Flu vaccine HIGH DOSE PF (Fluzone High dose)  . Hemoglobin A1c    Standing Status:   Future    Standing Expiration Date:   06/22/2019  . Lipid panel    Standing Status:   Future    Standing Expiration Date:   06/22/2019    Order Specific Question:   Has the patient fasted?    Answer:   Yes  . Basic metabolic panel    Standing Status:   Future    Standing Expiration Date:   06/22/2019    Order Specific Question:   Has the patient fasted?    Answer:   Yes  . TSH    Standing Status:   Future    Standing Expiration Date:   06/22/2019   Next appt:  11/05/2018   Nhyira Leano L. Lenita Peregrina, D.O. [facility] [address], [place] [address] Cell Phone (Mon-Fri 8am-5pm):  [phone] On Call:  [phone] & follow prompts after 5pm & weekends Office Phone:  [phone] Office Fax:  [phone]

## 2018-06-21 NOTE — Patient Instructions (Signed)
Fat and Cholesterol Restricted Diet High levels of fat and cholesterol in your blood may lead to various health problems, such as diseases of the heart, blood vessels, gallbladder, liver, and pancreas. Fats are concentrated sources of energy that come in various forms. Certain types of fat, including saturated fat, may be harmful in excess. Cholesterol is a substance needed by your body in small amounts. Your body makes all the cholesterol it needs. Excess cholesterol comes from the food you eat. When you have high levels of cholesterol and saturated fat in your blood, health problems can develop because the excess fat and cholesterol will gather along the walls of your blood vessels, causing them to narrow. Choosing the right foods will help you control your intake of fat and cholesterol. This will help keep the levels of these substances in your blood within normal limits and reduce your risk of disease. What is my plan? Your health care provider recommends that you:  Limit your fat intake   Limit the amount of cholesterol in your diet   Eat 20-30 grams of fiber each day.  What types of fat should I choose?  Choose healthy fats more often. Choose monounsaturated and polyunsaturated fats, such as olive and canola oil, flaxseeds, walnuts, almonds, and seeds.  Eat more omega-3 fats. Good choices include salmon, mackerel, sardines, tuna, flaxseed oil, and ground flaxseeds. Aim to eat fish at least two times a week.  Limit saturated fats. Saturated fats are primarily found in animal products, such as meats, butter, and cream. Plant sources of saturated fats include palm oil, palm kernel oil, and coconut oil.  Avoid foods with partially hydrogenated oils in them. These contain trans fats. Examples of foods that contain trans fats are stick margarine, some tub margarines, cookies, crackers, and other baked goods. What general guidelines do I need to follow? These guidelines for healthy eating will  help you control your intake of fat and cholesterol:  Check food labels carefully to identify foods with trans fats or high amounts of saturated fat.  Fill one half of your plate with vegetables and green salads.  Fill one fourth of your plate with whole grains. Look for the word "whole" as the first word in the ingredient list.  Fill one fourth of your plate with lean protein foods.  Limit fruit to two servings a day. Choose fruit instead of juice.  Eat more foods that contain fiber, such as apples, broccoli, carrots, beans, peas, and barley.  Eat more home-cooked food and less restaurant, buffet, and fast food.  Limit or avoid alcohol.  Limit foods high in starch and sugar.  Limit fried foods.  Cook foods using methods other than frying. Baking, boiling, grilling, and broiling are all great options.  Lose weight if you are overweight. Losing just 5-10% of your initial body weight can help your overall health and prevent diseases such as diabetes and heart disease.  What foods can I eat? Grains  Whole grains, such as whole wheat or whole grain breads, crackers, cereals, and pasta. Unsweetened oatmeal, bulgur, barley, quinoa, or brown rice. Corn or whole wheat flour tortillas. Vegetables  Fresh or frozen vegetables (raw, steamed, roasted, or grilled). Green salads. Fruits  All fresh, canned (in natural juice), or frozen fruits. Meats and other protein foods  Ground beef (85% or leaner), grass-fed beef, or beef trimmed of fat. Skinless chicken or turkey. Ground chicken or turkey. Pork trimmed of fat. All fish and seafood. Eggs. Dried beans, peas, or lentils.   Unsalted nuts or seeds. Unsalted canned or dry beans. Dairy  Low-fat dairy products, such as skim or 1% milk, 2% or reduced-fat cheeses, low-fat ricotta or cottage cheese, or plain low-fat yo Fats and oils  Tub margarines without trans fats. Light or reduced-fat mayonnaise and salad dressings. Avocado. Olive, canola,  sesame, or safflower oils. Natural peanut or almond butter (choose ones without added sugar and oil). The items listed above may not be a complete list of recommended foods or beverages. Contact your dietitian for more options. Foods to avoid Grains  White bread. White pasta. White rice. Cornbread. Bagels, pastries, and croissants. Crackers that contain trans fat. Vegetables  White potatoes. Corn. Creamed or fried vegetables. Vegetables in a cheese sauce. Fruits  Dried fruits. Canned fruit in light or heavy syrup. Fruit juice. Meats and other protein foods  Fatty cuts of meat. Ribs, chicken wings, bacon, sausage, bologna, salami, chitterlings, fatback, hot dogs, bratwurst, and packaged luncheon meats. Liver and organ meats. Dairy  Whole or 2% milk, cream, half-and-half, and cream cheese. Whole milk cheeses. Whole-fat or sweetened yogurt. Full-fat cheeses. Nondairy creamers and whipped toppings. Processed cheese, cheese spreads, or cheese curds. Beverages  Alcohol. Sweetened drinks (such as sodas, lemonade, and fruit drinks or punches). Fats and oils  Butter, stick margarine, lard, shortening, ghee, or bacon fat. Coconut, palm kernel, or palm oils. Sweets and desserts  Corn syrup, sugars, honey, and molasses. Candy. Jam and jelly. Syrup. Sweetened cereals. Cookies, pies, cakes, donuts, muffins, and ice cream. The items listed above may not be a complete list of foods and beverages to avoid. Contact your dietitian for more information. This information is not intended to replace advice given to you by your health care provider. Make sure you discuss any questions you have with your health care provider. Document Released: 08/08/2005 Document Revised: 08/29/2014 Document Reviewed: 11/06/2013 Elsevier Interactive Patient Education  2018 [employer] Inc[employer]  

## 2018-07-13 ENCOUNTER — Other Ambulatory Visit: Payer: Self-pay | Admitting: Internal Medicine

## 2018-07-13 DIAGNOSIS — E7849 Other hyperlipidemia: Secondary | ICD-10-CM

## 2018-07-30 ENCOUNTER — Telehealth: Payer: Self-pay | Admitting: *Deleted

## 2018-07-30 NOTE — Telephone Encounter (Signed)
Coricidin BP is the cough medicine for high blood pressure.  He could also take mucinex DM.

## 2018-07-30 NOTE — Telephone Encounter (Signed)
Spoke with patient's wife and advised results  

## 2018-07-30 NOTE — Telephone Encounter (Signed)
Anthony [name], wife called and stated that patient has a chest cold with Cough. No Fever. Wants to know what Decongestive/Cough Medication he can take that won't interfere with his Blood Pressure.   Coughing time a week.  No Fever. Congestion coughed up is clear. Has not been around anyone sick.  Been using the Robitussin DM with no relief  Please Advise.

## 2018-07-31 ENCOUNTER — Encounter: Payer: Self-pay | Admitting: Internal Medicine

## 2018-08-01 ENCOUNTER — Other Ambulatory Visit: Payer: Self-pay | Admitting: Cardiovascular Disease

## 2018-08-01 DIAGNOSIS — I10 Essential (primary) hypertension: Secondary | ICD-10-CM

## 2018-08-02 DIAGNOSIS — H26492 Other secondary cataract, left eye: Secondary | ICD-10-CM | POA: Diagnosis not present

## 2018-08-02 DIAGNOSIS — H40051 Ocular hypertension, right eye: Secondary | ICD-10-CM | POA: Diagnosis not present

## 2018-08-20 ENCOUNTER — Telehealth: Payer: Self-pay | Admitting: Cardiovascular Disease

## 2018-08-20 NOTE — Telephone Encounter (Signed)
° °  Patient's spouse calling to report episodes of chest pain and SOB    Pt c/o of Chest Pain: STAT if CP now or developed within 24 hours  1. Are you having CP right now? NO  2. Are you experiencing any other symptoms (ex. SOB, nausea, vomiting, sweating)? COUGH  3. How long have you been experiencing CP? 1 MONTH  4. Is your CP continuous or coming and going? COMING AND GOING  5. Have you taken Nitroglycerin? NO ?

## 2018-08-20 NOTE — Telephone Encounter (Signed)
Returned call to patient of Dr. Claiborne Billings who reports chest pain, SOB, cough that is off and on for 1 month. He has taken no PRN medications for symptoms. He uses nexium for reflux. He reports the cough is when he is resting. His chest pain is described as a pressure and is associated with SOB - no particular triggers noted. He EF was normal per last echo about 2 years ago and stress test was normal about 5 years ago. He has MD OV on Jan 6. Advised patient I can schedule him sooner with APP but he wishes to wait on appt with MD. Durward Fortes that he not plan overly strenuous activity between now and appt date. Routed to MD for any recommendations.

## 2018-08-25 ENCOUNTER — Other Ambulatory Visit: Payer: Self-pay | Admitting: Internal Medicine

## 2018-08-27 ENCOUNTER — Ambulatory Visit: Payer: PPO | Admitting: Cardiovascular Disease

## 2018-08-27 ENCOUNTER — Encounter: Payer: Self-pay | Admitting: Cardiovascular Disease

## 2018-08-27 VITALS — BP 138/76 | HR 74 | Ht 70.0 in | Wt 267.8 lb

## 2018-08-27 DIAGNOSIS — Z6838 Body mass index (BMI) 38.0-38.9, adult: Secondary | ICD-10-CM | POA: Diagnosis not present

## 2018-08-27 DIAGNOSIS — I10 Essential (primary) hypertension: Secondary | ICD-10-CM | POA: Diagnosis not present

## 2018-08-27 DIAGNOSIS — E6609 Other obesity due to excess calories: Secondary | ICD-10-CM

## 2018-08-27 DIAGNOSIS — E7800 Pure hypercholesterolemia, unspecified: Secondary | ICD-10-CM | POA: Diagnosis not present

## 2018-08-27 DIAGNOSIS — R079 Chest pain, unspecified: Secondary | ICD-10-CM

## 2018-08-27 DIAGNOSIS — Z789 Other specified health status: Secondary | ICD-10-CM

## 2018-08-27 DIAGNOSIS — R0609 Other forms of dyspnea: Secondary | ICD-10-CM

## 2018-08-27 MED ORDER — METOPROLOL TARTRATE 50 MG PO TABS: ORAL_TABLET | ORAL | 0 refills | Status: DC

## 2018-08-27 NOTE — Telephone Encounter (Signed)
Patient will be seen on January 6 in the office with me.

## 2018-08-27 NOTE — Progress Notes (Signed)
Patient ID: Aiman Noe., male   DOB: Dec 30, 1950, 68 y.o.   MRN: [id number]     HPI: Jomes Giraldo. is a 68 y.o. male who presents to the office for a 9 month  follow-up cardiology evaluation.   Mr. Moultrie has a history of hypertension, mild obesity, as well as hyperlipidemia. In the past, he did develop CPK elevation secondary to statin therapy. He has been able to tolerate Zetia. A nuclear perfusion study done in October 2013 for chest pain showed normal perfusion without scar or ischemia.   Laboratory in October 2014 showed cholesterol 196, triglycerides significantly elevated at 291, HDL of 34, VLDL increased at 58, and  LDL 106. Renal function was normal. BUN 15 kerning 0.79.   He developed recurrent episodes of chest pain for which he saw [name] and because of exertionally precipitated episodes of discomfort definitive cardiac catheterization was recommended. This was done by me on 09/05/2013 and revealed normal coronary arteries with normal LV function without focal segmental wall motion abnormalities. Ejection fraction was 55%.  He underwent an echo Doppler study on 09/04/2013 which showed an ejection fraction of 55-60%. He did have grade 1 diastolic dysfunction and mild left ventricular hypertrophy. He had mild mitral annular calcification with mildly thickened leaflets and trivial mitral regurgitation. A moderate pressure estimate was 27 mm.  He was  seen by [name] on 08/17/2016 with complaints of chest discomfort.  He's episodes would occur often when he was walking on his driveway her in the grocery store and had been ongoing for 1-2 months.  He had diffuse tenderness to palpation along his left pectoral region.  On exam it was felt that some of his chest pain was musculoskeletal in etiology.  He was hypertensive and she further titrated amlodipine.  Because of exertional dyspnea.  She referred him for an echo Doppler study which was done on 08/23/2016.  This showed  mild LVH with normal systolic function and grade 1 diastolic dysfunction.  There is very mild increased peak PA pressure 34 mm.  I saw him in January 2019 after not having seen him since February 2015.  At that time, his pressure was elevated despite taking amlodipine 7.5 mg.  I recommended further titration to 10 mg daily.  On this increased dose, he states his blood pressure at home typically has been running in the 130-135 range.  He denies chest pain PND orthopnea.  He has a history of GERD which is controlled with Nexium.  He has had some issues with left arm paresthesias and was diagnosed with significant cervical degenerative disc disease.  He is scheduled to undergo neck surgery on December 12, 2017 by Dr. Trenton Gammon involving C4-5 and C5-6.  Preoperative surgical clearance was recommended.  He denies any chest pain or shortness of breath.  He has not been exercising as he had in the past in the winter months but hopes to do so following his surgery and with improved weather.    When I last saw him in April 2019 he was given surgical clearance for his cervical surgery.  He underwent successful surgery by Dr. Trenton Gammon in July 2019 involving C4-5 and C5-6 and tolerated this well.  Recently, he has noticed episodes of some chest wall discomfort.  He denies any clear-cut exertional precipitation but at times he feels this chest discomfort intermittently not related to activity.  He is unable to walk well and is status post bilateral knee and bilateral hip replacement surgery.  He admits to experiencing exertional shortness of breath.  He presents for follow-up evaluation.  Past Medical History:  Diagnosis Date  . Benign neoplasm of colon   . Benign neoplasm of skin of upper limb, including shoulder   . Carpal tunnel syndrome   . Chest pain    a. normal cors by cath in 2015  . Diaphragmatic hernia without mention of obstruction or gangrene   . Dyslipidemia   . Elevated prostate specific antigen (PSA)   .  GERD (gastroesophageal reflux disease)   . Herpes zoster without mention of complication   . History of hiatal hernia   . Hypertension   . Hypertrophy of prostate with urinary obstruction and other lower urinary tract symptoms (LUTS)   . Impotence of organic origin   . Lumbago   . Obesity, unspecified   . Osteoarthrosis, unspecified whether generalized or localized, lower leg   . Other and unspecified hyperlipidemia   . Other malaise and fatigue   . Other seborrheic keratosis   . Special screening for malignant neoplasm of prostate   . Stenosis, cervical spine   . Unspecified tinnitus     Past Surgical History:  Procedure Laterality Date  . ANTERIOR CERVICAL DECOMP/DISCECTOMY FUSION N/A 02/19/2018   Procedure: ANTERIOR CERVICAL DECOMPRESSION/DISCECTOMY FUSION - CERVICAL FOUR-CERVICAL FIVE - CERVICAL FIVE-CERVICAL SIX;  Surgeon: Earnie Larsson, MD;  Location: [facility];  Service: Neurosurgery;  Laterality: N/A;  . CARDIOVASCULAR STRESS TEST  06/15/2012   Non-diagnostic for ischemia. No lexiscan EKG changes.  . CHOLECYSTECTOMY  1992  . EYE SURGERY    . eyeband  2006   removal of right eyeband  . INGUINAL HERNIA REPAIR  1984   left  . INTRAOCULAR LENS REMOVAL  2008   right  . LEFT HEART CATHETERIZATION WITH CORONARY ANGIOGRAM N/A 09/05/2013   Procedure: LEFT HEART CATHETERIZATION WITH CORONARY ANGIOGRAM;  Surgeon: Troy Sine, MD;  Location[facility] C[facility] LAB;  Service: Cardiovascular;  Laterality: N/A;  . LOWER VENOUS EXTREMITY DOPPLER  01/26/2011   No evidence of thrombus or thrombophlebitis.  Marland Kitchen RETINAL DETACHMENT SURGERY  2001   right  . TOTAL HIP ARTHROPLASTY  2012   bilateral  . TOTAL KNEE ARTHROPLASTY  2011, 2012   bilateral  . TRANSTHORACIC ECHOCARDIOGRAM  10/05/2009   EF >55%, normal LV size, systolic, and diastolic function.    Allergies  Allergen Reactions  . Lipitor [Atorvastatin] Other (See Comments)    Leg pain/cramps  . Red Yeast Rice [Cholestin] Other (See Comments)     Makes legs hurt  . Statins Other (See Comments)    "muscle break down"  . Codeine Other (See Comments)    Headache  . Latex Rash    Current Outpatient Medications  Medication Sig Dispense Refill  . amLODipine (NORVASC) 10 MG tablet TAKE 1 TABLET BY MOUTH EVERY DAY IN THE MORNING 90 tablet 3  . aspirin EC 81 MG tablet Take 81 mg by mouth every morning.    . benazepril (LOTENSIN) 40 MG tablet TAKE 1 TABLET BY MOUTH EVERY DAY IN THE MORNING 90 tablet 1  . brimonidine (ALPHAGAN P) 0.1 % SOLN Place 1 drop into the right eye 2 (two) times daily.    . dorzolamide-timolol (COSOPT) 22.3-6.8 MG/ML ophthalmic solution Place 2 drops into the right eye 2 (two) times daily.     Marland Kitchen esomeprazole (NEXIUM) 40 MG capsule TAKE 1 CAPSULE BY MOUTH AT BEDTIME. FOR REFLUX 90 capsule 1  . ezetimibe (ZETIA) 10 MG tablet TAKE 1  TABLET BY MOUTH EVERY DAY IN THE MORNING 90 tablet 1  . famotidine (PEPCID) 20 MG tablet One at bedtime to reduce stomach acid 30 tablet 5  . finasteride (PROSCAR) 5 MG tablet TAKE 1 TABLET BY MOUTH EVERY DAY IN THE MORNING 90 tablet 1  . hydrochlorothiazide (HYDRODIURIL) 25 MG tablet Take 1 tablet (25 mg total) by mouth every morning. 90 tablet 3  . Polyvinyl Alcohol-Povidone (REFRESH OP) Place 1 drop into the right eye daily as needed (dry eyes/irritation).    . metoprolol tartrate (LOPRESSOR) 50 MG tablet Take 1 tablet by mouth once for procedure. 2 tablet 0   No current facility-administered medications for this visit.     Social History   Socioeconomic History  . Marital status: Married    Spouse name: Not on file  . Number of children: Not on file  . Years of education: Not on file  . Highest education level: Not on file  Occupational History  . Occupation: retired Medical sales representative     Comment: Hospital doctor  Social Needs  . Financial resource strain: Not hard at all  . Food insecurity:    Worry: Never true    Inability: Never true  . Transportation needs:    Medical: No      Non-medical: No  Tobacco Use  . Smoking status: Never Smoker  . Smokeless tobacco: Never Used  Substance and Sexual Activity  . Alcohol use: No  . Drug use: No  . Sexual activity: Yes    Partners: Female    Comment: wife  Lifestyle  . Physical activity:    Days per week: 2 days    Minutes per session: 60 min  . Stress: Only a little  Relationships  . Social connections:    Talks on phone: Twice a week    Gets together: Twice a week    Attends religious service: More than 4 times per year    Active member of club or organization: No    Attends meetings of clubs or organizations: Never    Relationship status: Married  . Intimate partner violence:    Fear of current or ex partner: No    Emotionally abused: No    Physically abused: No    Forced sexual activity: No  Other Topics Concern  . Not on file  Social History Narrative   Married   Never smoked   Alcohol none   Exercise -gym 3 days a week (join 3 months ago)   Living Will   Social is notable that he is married has 2 children and 2 grandchildren. He is not routinely exercise. There is no tobacco use. He does drink occasional alcohol.  Family History  Problem Relation Age of Onset  . Diabetes Mother   . Arrhythmia Mother   . Hypertension Mother   . Dementia Father   . Pulmonary embolism Father   . Hypertension Father   . Cancer Sister        Liver cancer - Histocytic lymphoma  . Cancer Sister 73       Skin cancer  . Hypertension Sister 33  . Diabetes Sister   . Stroke Brother   . Diabetes Brother   . Hypertension Brother   . Cancer Daughter   . Cancer Maternal Grandmother        Skin cancer  . Heart attack Maternal Grandfather   . Cancer Paternal Grandfather   . Hypertension Paternal Grandfather     ROS General: Negative;  No fevers, chills, or night sweats;  positive for a 16 pound weight loss in September 2018 HEENT: Negative; No changes in vision or hearing, sinus congestion, difficulty  swallowing Pulmonary: Negative; No cough, wheezing, shortness of breath, hemoptysis Cardiovascular: Positive for occasional exertional shortness of breath.  No exertional chest pain. GI: Positive for GERD  GU: Negative; No dysuria, hematuria, or difficulty voiding Musculoskeletal: Lateral hip and knee discomfort; status post recent surgery C4-5/C5-6 Hematologic/Oncology: Negative; no easy bruising, bleeding Endocrine: Negative; no heat/cold intolerance; no diabetes Neuro: Mild left arm paresthesias from his cervical degenerative disease Skin: Negative; No rashes or skin lesions Psychiatric: Negative; No behavioral problems, depression Sleep: Negative; No snoring, daytime sleepiness, hypersomnolence, bruxism, restless legs, hypnogognic hallucinations, no cataplexy Other comprehensive 14 point system review is negative.  PE BP 138/76   Pulse 74   Ht _0  (1.778 m)   Wt 267 lb 12.8 oz (121.5 kg)   BMI 38.43 kg/m    Repeat blood pressure 130/70  Wt Readings from Last 3 Encounters:  08/27/18 267 lb 12.8 oz (121.5 kg)  06/21/18 262 lb (118.8 kg)  04/30/18 268 lb 3.2 oz (121.7 kg)  General: Alert, oriented, no distress.  Skin: normal turgor, no rashes, warm and dry HEENT: Normocephalic, atraumatic. Pupils equal round and reactive to light; sclera anicteric; extraocular muscles intact;  Nose without nasal septal hypertrophy Mouth/Parynx benign; Mallinpatti scale 3 Neck: No JVD, no carotid bruits; normal carotid upstroke Lungs: clear to ausculatation and percussion; no wheezing or rales Chest mild tenderness to palpation along the left lateral chest wall Heart: PMI not displaced, RRR, s1 s2 normal, 1/6 systolic murmur, no diastolic murmur, no rubs, gallops, thrills, or heaves Abdomen: Central adiposity; soft, nontender; no hepatosplenomehaly, BS+; abdominal aorta nontender and not dilated by palpation. Back: no CVA tenderness Pulses 2+ Musculoskeletal: full range of motion, normal  strength, no joint deformities Extremities: no clubbing cyanosis or edema, Homan's sign negative  Neurologic: grossly nonfocal; Cranial nerves grossly wnl Psychologic: Normal mood and affect   ECG (independently read by me): Normal sinus rhythm at 74 bpm.  No ectopy.  No ST segment changes.  April 2019 ECG (independently read by me): Normal sinus rhythm at 66 bpm.  Normal intervals.  No ectopy.  No ST segment changes.  August 28, 2017 ECG (independently read by me): Normal sinus rhythm at 63 bpm.  Normal intervals.  No ST segment changes.  February 2015 ECG (independently read by me): Normal sinus rhythm at 66 beats per minute. PR interval 160 ms. QTC intervals 34 ms. No significant ST-T changes.  Prior ECG from November 2014 :Normal sinus rhythm at 69 beats per minute. No ectopy. Normal intervals.  LABS:  BMP Latest Ref Rng & Units 02/19/2018 11/29/2017 10/31/2017  Glucose 70 - 99 mg/dL 100(H) 110(H) 97  BUN 8 - 23 mg/dL _1 Creatinine 0.61 - 1.24 mg/dL 0.92 0.95 0.98  BUN/Creat Ratio 6 - 22 (calc) - - NOT APPLICABLE  Sodium 700 - 145 mmol/L 140 140 143  Potassium 3.5 - 5.1 mmol/L 3.4(L) 3.8 4.0  Chloride 98 - 111 mmol/L 107 106 107  CO2 22 - 32 mmol/L _2 Calcium 8.9 - 10.3 mg/dL 9.1 9.3 9.3   CBC Latest Ref Rng & Units 02/19/2018 11/29/2017 10/31/2017  WBC 4.0 - 10.5 K/uL 6.7 6.7 6.9  Hemoglobin 13.0 - 17.0 g/dL 14.6 15.1 15.2  Hematocrit 39.0 - 52.0 % 43.0 44.4 43.9  Platelets 150 - 400 K/uL 188  199 195   No results found for: TSH  Lab Results  Component Value Date   HGBA1C 5.3 06/18/2018   Hepatic Function Latest Ref Rng & Units 10/31/2017 05/01/2017 10/27/2016  Total Protein 6.1 - 8.1 g/dL 5.8(L) 5.6(L) 5.9(L)  Albumin 3.6 - 5.1 g/dL - - 4.1  AST 10 - 35 U/L _0 ALT 9 - 46 U/L _1 Alk Phosphatase 40 - 115 U/L - - 71  Total Bilirubin 0.2 - 1.2 mg/dL 0.6 0.5 0.6   Lipid Panel     Component Value Date/Time   CHOL 162 06/18/2018 0802   CHOL 175  01/25/2016 0825   CHOL 191 07/22/2013 0835   TRIG 123 06/18/2018 0802   TRIG 253 (H) 07/22/2013 0835   HDL 39 (L) 06/18/2018 0802   HDL 46 01/25/2016 0825   HDL 38 (L) 07/22/2013 0835   CHOLHDL 4.2 06/18/2018 0802   VLDL 35 (H) 10/27/2016 0825   LDLCALC 101 (H) 06/18/2018 0802   LDLCALC 102 (H) 07/22/2013 0802    RADIOLOGY: No results found.  IMPRESSION:  1. Chest pain, unspecified type   2. Dyspnea on exertion   3. Essential hypertension   4. Class 2 obesity due to excess calories with body mass index (BMI) of 38.0 to 38.9 in adult, unspecified whether serious comorbidity present   5. Pure hypercholesterolemia   6. Statin intolerance     ASSESSMENT AND PLAN: Mr. Solis is a 68 year-old gentleman who has a history of moderate obesity, hyperlipidemia, hypertension, and GERD.  He had developed chest discomfort 2015 which ultimately led to definitive cardiac catheterization.  This revealed normal coronary arteries.  He recently underwent successful surgery by Dr. Trenton Gammon involving C4-5 and C5-6 and tolerated this well from a cardiac standpoint.  Recently he has begun to notice more exertional shortness of breath.  He experiences chest discomfort but his chest pain is typically nonexertional and on exam he does have some tenderness to palpation suggesting probable musculoskeletal etiology.  His last echo Doppler study in January 2018 had shown an EF of 55 to 60% with grade 1 diastolic dysfunction wall motion abnormality.  Presently, his blood pressure today is stable and on repeat by me was 130/70 on his regimen consisting of amlodipine 10 mg, benazepril 40 mg and HCTZ 25 mg daily.  He is intolerant to statin and has been on Zetia 10 mg.  Most recent laboratory in October 2019 showed an LDL at 101.  Suspect his recent chest discomfort is musculoskeletal in etiology.  However, with his exertional dyspnea, I have recommended reevaluation of his coronary anatomy and have suggested CT coronary  angiography be obtained.  I will also perform a 2-year follow-up echo Doppler assessment.  I will see him in the office in 3 months for reevaluation and further recommendations were made at that time.  Time spent: 25 minutes Troy Sine, MD, [facility]  08/27/2018 6:46 PM

## 2018-08-27 NOTE — Patient Instructions (Addendum)
Medication Instructions:  Take Metoprolol 100 mg once for the CT. If you need a refill on your cardiac medications before your next appointment, please call your pharmacy.   Lab work: BMET 1 week before CT. If you have labs (blood work) drawn today and your tests are completely normal, you will receive your results only by: Marland Kitchen MyChart Message (if you have MyChart) OR . A paper copy in the mail If you have any lab test that is abnormal or we need to change your treatment, we will call you to review the results.  Testing/Procedures: Your physician has requested that you have cardiac CT. Cardiac computed tomography (CT) is a painless test that uses an x-ray machine to take clear, detailed pictures of your heart. For further information please visit [email or web address][facility]tn. Please follow instruction sheet as given.  Echocardiogram - Your physician has requested that you have an echocardiogram. Echocardiography is a painless test that uses sound waves to create images of your heart. It provides your doctor with information about the size and shape of your heart and how well your heart's chambers and valves are working. This procedure takes approximately one hour. There are no restrictions for this procedure. This will be performed at our [facility] location - [address].   Follow-Up: At [facility], you and your health needs are our priority.  As part of our continuing mission to provide you with exceptional heart care, we have created designated Provider Care Teams.  These Care Teams include your primary Cardiologist (physician) and Advanced Practice Providers (APPs -  Physician Assistants and Nurse Practitioners) who all work together to provide you with the care you need, when you need it. You will need a follow up appointment in 3 months. You may see Shelva Majestic, MD or one of the following Advanced Practice Providers on your designated Care Team: [place], [place] . Fabian Sharp,  PA-C       Any Other Special Instructions Will Be Listed Below (If Applicable). Please arrive at the [facility] main entrance of [facility] (30-45 minutes prior to test start time)  [facility] [place], [place] [address] [phone]  Proceed to the [facility] Radiology Department (First Floor).  Please follow these instructions carefully (unless otherwise directed):  Hold all erectile dysfunction medications at least 48 hours prior to test.  On the Night Before the Test: . Be sure to Drink plenty of water. . Do not consume any caffeinated/decaffeinated beverages or chocolate 12 hours prior to your test. . Do not take any antihistamines 12 hours prior to your test.  On the Day of the Test: . Drink plenty of water. Do not drink any water within one hour of the test. . Do not eat any food 4 hours prior to the test. . You may take your regular medications prior to the test.  . Take metoprolol (Lopressor) two hours prior to test. . HOLD Furosemide/Hydrochlorothiazide morning of the test.       After the Test: . Drink plenty of water. . After receiving IV contrast, you may experience a mild flushed feeling. This is normal. . On occasion, you may experience a mild rash up to 24 hours after the test. This is not dangerous. If this occurs, you can take Benadryl 25 mg and increase your fluid intake. . If you experience trouble breathing, this can be serious. If it is severe call 911 IMMEDIATELY. If it is mild, please  call our office. . If you take any of these medications: Glipizide/Metformin, Avandament, Glucavance, please do not take 48 hours after completing test.

## 2018-08-30 ENCOUNTER — Other Ambulatory Visit: Payer: Self-pay

## 2018-08-30 ENCOUNTER — Ambulatory Visit (HOSPITAL_COMMUNITY): Payer: PPO | Attending: Cardiology

## 2018-08-30 DIAGNOSIS — R0609 Other forms of dyspnea: Secondary | ICD-10-CM | POA: Diagnosis not present

## 2018-09-06 DIAGNOSIS — R079 Chest pain, unspecified: Secondary | ICD-10-CM | POA: Diagnosis not present

## 2018-09-06 DIAGNOSIS — R0609 Other forms of dyspnea: Secondary | ICD-10-CM | POA: Diagnosis not present

## 2018-09-06 LAB — BASIC METABOLIC PANEL
BUN/Creatinine Ratio: 17 (ref 10–24)
BUN: 17 mg/dL (ref 8–27)
CO2: 25 mmol/L (ref 20–29)
Calcium: 9.6 mg/dL (ref 8.6–10.2)
Chloride: 101 mmol/L (ref 96–106)
Creatinine, Ser: 0.99 mg/dL (ref 0.76–1.27)
GFR calc Af Amer: 89 mL/min/{1.73_m2} (ref >59)
GFR calc non Af Amer: 77 mL/min/{1.73_m2} (ref >59)
Glucose: 101 mg/dL — ABNORMAL HIGH (ref 65–99)
POTASSIUM: 4.1 mmol/L (ref 3.5–5.2)
Sodium: 141 mmol/L (ref 134–144)

## 2018-09-11 DIAGNOSIS — H26492 Other secondary cataract, left eye: Secondary | ICD-10-CM | POA: Diagnosis not present

## 2018-09-13 ENCOUNTER — Telehealth (HOSPITAL_COMMUNITY): Payer: Self-pay | Admitting: Emergency Medicine

## 2018-09-13 NOTE — Telephone Encounter (Signed)
Left message on voicemail with name and callback number Darl Brisbin RN Navigator Cardiac Imaging [facility] [phone] Office [phone] Cell  

## 2018-09-13 NOTE — Telephone Encounter (Signed)
Pt returned phone call --  pt verbalizes understanding of appt date/time, parking situation and where to check in, pre-test NPO status and medications ordered, and verified current allergies; name and call back number provided for further questions should they arise Marchia Bond RN Navigator Cardiac Imaging [place] [facility] [phone] office [phone] cell  States he will take all medications as prescribed, except HCTZ. Will add the metoprolol 2 hr prior to scan

## 2018-09-14 ENCOUNTER — Ambulatory Visit (HOSPITAL_COMMUNITY): Admission: RE | Admit: 2018-09-14 | Payer: PPO | Source: Ambulatory Visit

## 2018-09-14 ENCOUNTER — Ambulatory Visit (HOSPITAL_COMMUNITY)
Admission: RE | Admit: 2018-09-14 | Discharge: 2018-09-14 | Disposition: A | Discharge status: Home / Self Care | Payer: PPO | Source: Ambulatory Visit | Attending: Cardiovascular Disease | Admitting: Cardiovascular Disease

## 2018-09-14 DIAGNOSIS — R079 Chest pain, unspecified: Secondary | ICD-10-CM

## 2018-09-14 MED ORDER — NITROGLYCERIN 0.4 MG SL SUBL
0.8000 mg | SUBLINGUAL_TABLET | Freq: Once | SUBLINGUAL | Status: AC
  Administered 2018-09-14: 0.8 mg via SUBLINGUAL
  Filled 2018-09-14: qty 25

## 2018-09-14 MED ORDER — NITROGLYCERIN 0.4 MG SL SUBL
SUBLINGUAL_TABLET | SUBLINGUAL | Status: AC
  Administered 2018-09-14: 0.8 mg via SUBLINGUAL
  Filled 2018-09-14: qty 2

## 2018-09-14 MED ORDER — IOPAMIDOL (ISOVUE-370) INJECTION 76%
80.0000 mL | Freq: Once | INTRAVENOUS | Status: AC | PRN
  Administered 2018-09-14: 80 mL via INTRAVENOUS

## 2018-09-19 ENCOUNTER — Other Ambulatory Visit: Payer: Self-pay | Admitting: Cardiovascular Disease

## 2018-10-03 ENCOUNTER — Encounter: Payer: Self-pay | Admitting: Family

## 2018-10-03 ENCOUNTER — Encounter: Payer: Self-pay | Admitting: Internal Medicine

## 2018-10-11 DIAGNOSIS — Z6838 Body mass index (BMI) 38.0-38.9, adult: Secondary | ICD-10-CM | POA: Diagnosis not present

## 2018-10-11 DIAGNOSIS — M545 Low back pain: Secondary | ICD-10-CM | POA: Diagnosis not present

## 2018-10-11 DIAGNOSIS — Z981 Arthrodesis status: Secondary | ICD-10-CM | POA: Diagnosis not present

## 2018-10-11 DIAGNOSIS — I10 Essential (primary) hypertension: Secondary | ICD-10-CM | POA: Diagnosis not present

## 2018-10-18 DIAGNOSIS — M47816 Spondylosis without myelopathy or radiculopathy, lumbar region: Secondary | ICD-10-CM | POA: Diagnosis not present

## 2018-10-22 ENCOUNTER — Other Ambulatory Visit: Payer: PPO

## 2018-10-22 DIAGNOSIS — E782 Mixed hyperlipidemia: Secondary | ICD-10-CM

## 2018-10-22 DIAGNOSIS — R739 Hyperglycemia, unspecified: Secondary | ICD-10-CM | POA: Diagnosis not present

## 2018-10-22 DIAGNOSIS — Z6837 Body mass index (BMI) 37.0-37.9, adult: Secondary | ICD-10-CM

## 2018-10-23 ENCOUNTER — Encounter: Payer: Self-pay | Admitting: *Deleted

## 2018-10-23 LAB — HEMOGLOBIN A1C
Hgb A1c MFr Bld: 5.3 % of total Hgb (ref <5.7)
Mean Plasma Glucose: 105 (calc)
eAG (mmol/L): 5.8 (calc)

## 2018-10-23 LAB — LIPID PANEL
Cholesterol: 173 mg/dL (ref <200)
HDL: 46 mg/dL (ref >=40)
LDL Cholesterol (Calc): 103 mg/dL (calc) — ABNORMAL HIGH
Non-HDL Cholesterol (Calc): 127 mg/dL (calc) (ref <130)
Total CHOL/HDL Ratio: 3.8 (calc) (ref <5.0)
Triglycerides: 143 mg/dL (ref <150)

## 2018-10-23 LAB — TSH: TSH: 3.66 mIU/L (ref 0.40–4.50)

## 2018-10-24 DIAGNOSIS — M47816 Spondylosis without myelopathy or radiculopathy, lumbar region: Secondary | ICD-10-CM | POA: Diagnosis not present

## 2018-10-25 ENCOUNTER — Ambulatory Visit: Payer: PPO | Admitting: Internal Medicine

## 2018-11-01 ENCOUNTER — Ambulatory Visit: Payer: PPO | Admitting: Internal Medicine

## 2018-11-03 ENCOUNTER — Other Ambulatory Visit: Payer: Self-pay | Admitting: Internal Medicine

## 2018-11-05 ENCOUNTER — Encounter: Payer: Self-pay | Admitting: Family

## 2018-11-05 ENCOUNTER — Ambulatory Visit: Payer: Self-pay

## 2018-11-06 ENCOUNTER — Encounter: Payer: Self-pay | Admitting: Family

## 2018-11-07 DIAGNOSIS — Z6838 Body mass index (BMI) 38.0-38.9, adult: Secondary | ICD-10-CM | POA: Diagnosis not present

## 2018-11-07 DIAGNOSIS — I10 Essential (primary) hypertension: Secondary | ICD-10-CM | POA: Diagnosis not present

## 2018-11-07 DIAGNOSIS — M47816 Spondylosis without myelopathy or radiculopathy, lumbar region: Secondary | ICD-10-CM | POA: Diagnosis not present

## 2018-11-28 DIAGNOSIS — E1169 Type 2 diabetes mellitus with other specified complication: Secondary | ICD-10-CM | POA: Diagnosis not present

## 2018-11-28 DIAGNOSIS — I10 Essential (primary) hypertension: Secondary | ICD-10-CM | POA: Diagnosis not present

## 2018-11-28 DIAGNOSIS — K760 Fatty (change of) liver, not elsewhere classified: Secondary | ICD-10-CM | POA: Diagnosis not present

## 2018-11-28 DIAGNOSIS — F411 Generalized anxiety disorder: Secondary | ICD-10-CM | POA: Diagnosis not present

## 2018-11-28 DIAGNOSIS — Z0000 Encounter for general adult medical examination without abnormal findings: Secondary | ICD-10-CM | POA: Diagnosis not present

## 2018-11-28 DIAGNOSIS — Z1211 Encounter for screening for malignant neoplasm of colon: Secondary | ICD-10-CM | POA: Diagnosis not present

## 2018-11-28 DIAGNOSIS — Z6838 Body mass index (BMI) 38.0-38.9, adult: Secondary | ICD-10-CM | POA: Diagnosis not present

## 2018-11-28 DIAGNOSIS — R7301 Impaired fasting glucose: Secondary | ICD-10-CM | POA: Diagnosis not present

## 2018-11-28 DIAGNOSIS — E782 Mixed hyperlipidemia: Secondary | ICD-10-CM | POA: Diagnosis not present

## 2018-11-28 DIAGNOSIS — M47816 Spondylosis without myelopathy or radiculopathy, lumbar region: Secondary | ICD-10-CM | POA: Diagnosis not present

## 2018-11-28 DIAGNOSIS — Z23 Encounter for immunization: Secondary | ICD-10-CM | POA: Diagnosis not present

## 2018-11-28 DIAGNOSIS — M85852 Other specified disorders of bone density and structure, left thigh: Secondary | ICD-10-CM | POA: Diagnosis not present

## 2018-11-28 DIAGNOSIS — E042 Nontoxic multinodular goiter: Secondary | ICD-10-CM | POA: Diagnosis not present

## 2018-11-28 DIAGNOSIS — G720 Drug-induced myopathy: Secondary | ICD-10-CM | POA: Diagnosis not present

## 2018-11-28 DIAGNOSIS — J309 Allergic rhinitis, unspecified: Secondary | ICD-10-CM | POA: Diagnosis not present

## 2018-12-04 ENCOUNTER — Ambulatory Visit: Payer: PPO | Admitting: Cardiovascular Disease

## 2018-12-05 DIAGNOSIS — R0602 Shortness of breath: Secondary | ICD-10-CM | POA: Diagnosis not present

## 2018-12-05 DIAGNOSIS — M47816 Spondylosis without myelopathy or radiculopathy, lumbar region: Secondary | ICD-10-CM | POA: Diagnosis not present

## 2018-12-05 DIAGNOSIS — I10 Essential (primary) hypertension: Secondary | ICD-10-CM | POA: Diagnosis not present

## 2018-12-05 DIAGNOSIS — R05 Cough: Secondary | ICD-10-CM | POA: Diagnosis not present

## 2018-12-05 DIAGNOSIS — Z6839 Body mass index (BMI) 39.0-39.9, adult: Secondary | ICD-10-CM | POA: Diagnosis not present

## 2019-01-02 ENCOUNTER — Other Ambulatory Visit: Payer: Self-pay | Admitting: Internal Medicine

## 2019-01-02 DIAGNOSIS — E7849 Other hyperlipidemia: Secondary | ICD-10-CM

## 2019-01-02 NOTE — Telephone Encounter (Signed)
Pt has canceled all appts and have not scheduled any future appts, he needs to be seen for refills

## 2019-01-08 ENCOUNTER — Encounter: Payer: Self-pay | Admitting: Internal Medicine

## 2019-01-08 NOTE — Telephone Encounter (Signed)
Routed to Dr.Reed and her CMA

## 2019-01-21 ENCOUNTER — Encounter: Payer: Self-pay | Admitting: Internal Medicine

## 2019-01-21 ENCOUNTER — Other Ambulatory Visit: Payer: Self-pay

## 2019-01-21 ENCOUNTER — Ambulatory Visit (INDEPENDENT_AMBULATORY_CARE_PROVIDER_SITE_OTHER): Payer: PPO | Admitting: Internal Medicine

## 2019-01-21 DIAGNOSIS — E7849 Other hyperlipidemia: Secondary | ICD-10-CM

## 2019-01-21 DIAGNOSIS — H401110 Primary open-angle glaucoma, right eye, stage unspecified: Secondary | ICD-10-CM

## 2019-01-21 DIAGNOSIS — I10 Essential (primary) hypertension: Secondary | ICD-10-CM | POA: Diagnosis not present

## 2019-01-21 DIAGNOSIS — G720 Drug-induced myopathy: Secondary | ICD-10-CM

## 2019-01-21 DIAGNOSIS — K222 Esophageal obstruction: Secondary | ICD-10-CM | POA: Diagnosis not present

## 2019-01-21 DIAGNOSIS — R739 Hyperglycemia, unspecified: Secondary | ICD-10-CM

## 2019-01-21 DIAGNOSIS — T466X5A Adverse effect of antihyperlipidemic and antiarteriosclerotic drugs, initial encounter: Secondary | ICD-10-CM | POA: Insufficient documentation

## 2019-01-21 MED ORDER — EZETIMIBE 10 MG PO TABS: 10.0000 mg | ORAL_TABLET | Freq: Every day | ORAL | 1 refills | Status: DC

## 2019-01-21 NOTE — Progress Notes (Signed)
Location:  [facility] clinic Provider:  Tavin Vernet L. Mariea Clonts, D.O., C.M.D.  Goals of Care:  Advanced Directives 04/30/2018  Does Patient Have a Medical Advance Directive? Yes  Type of Advance Directive Living will  Does patient want to make changes to medical advance directive? No - Patient declined  Copy of [place] in Chart? -  Would patient like information on creating a medical advance directive? -   Chief Complaint  Patient presents with  . Medical Management of Chronic Issues    follow-up    HPI: Patient is a 68 y.o. [name] seen today for medical management of chronic diseases.    He was 268 lbs last visit and now 283. He has been on his feet all weekend and his legs have swelled considerably.  He admits to salty foods while camping, too.  No shortness of breath unless up and down hills--can't do like he used to and takes longer to get things done.  No chest pain.  He does get cramps in his thighs or his abdomen when he overexerts--says it happened over the weekend, but a big teaspoon of mustard does the trick.  He is tired from this also.  Had C4-5 and C5-6 fused in July.  Saw me last in October.    He says he has got to start to do deliberate exercise when I suggested it.  Walking really hurts his hips.  He walked up and down hills and on ladders and things.  He did use stationary bike when he did therapy.  He can get one when he gets back to the gym post-covid.    He still has some trouble with his swallowing--Dr. Buccini wants to stretch his esophagus.  He feels like there is more pressure on his throat since the neck surgery.  He had been having some cough which was ameliorated with hot honey and apple cider vinegar.  It did help and he stopped a week ago.    BP is good.  Sees Dr. Trenton Gammon tomorrow in f/u about his neck.  He is doing much better overall except he can't turn left real good since he has a fusion.    Admits to eating more.  He loves breakfast.  Used to  skip lunch.  Now he has a little something.  Then has a full dinner.  He is back on coca colas.    Has glaucoma right eye on cosopt and alphagan.  Due to go this month.  Goes about every 6 mos.  He is agreeable to a weight loss clinic if he's unable to start losing on his own by the next visit.    Past Medical History:  Diagnosis Date  . Benign neoplasm of colon   . Benign neoplasm of skin of upper limb, including shoulder   . Carpal tunnel syndrome   . Chest pain    a. normal cors by cath in 2015  . Diaphragmatic hernia without mention of obstruction or gangrene   . Dyslipidemia   . Elevated prostate specific antigen (PSA)   . GERD (gastroesophageal reflux disease)   . Herpes zoster without mention of complication   . History of hiatal hernia   . Hypertension   . Hypertrophy of prostate with urinary obstruction and other lower urinary tract symptoms (LUTS)   . Impotence of organic origin   . Lumbago   . Obesity, unspecified   . Osteoarthrosis, unspecified whether generalized or localized, lower leg   . Other and  unspecified hyperlipidemia   . Other malaise and fatigue   . Other seborrheic keratosis   . Special screening for malignant neoplasm of prostate   . Stenosis, cervical spine   . Unspecified tinnitus     Past Surgical History:  Procedure Laterality Date  . ANTERIOR CERVICAL DECOMP/DISCECTOMY FUSION N/A 02/19/2018   Procedure: ANTERIOR CERVICAL DECOMPRESSION/DISCECTOMY FUSION - CERVICAL FOUR-CERVICAL FIVE - CERVICAL FIVE-CERVICAL SIX;  Surgeon: Earnie Larsson, MD;  Location: [facility];  Service: Neurosurgery;  Laterality: N/A;  . CARDIOVASCULAR STRESS TEST  06/15/2012   Non-diagnostic for ischemia. No lexiscan EKG changes.  . CHOLECYSTECTOMY  1992  . EYE SURGERY    . eyeband  2006   removal of right eyeband  . INGUINAL HERNIA REPAIR  1984   left  . INTRAOCULAR LENS REMOVAL  2008   right  . LEFT HEART CATHETERIZATION WITH CORONARY ANGIOGRAM N/A 09/05/2013   Procedure: LEFT  HEART CATHETERIZATION WITH CORONARY ANGIOGRAM;  Surgeon: Troy Sine, MD;  Location: [facility] CATH LAB;  Service: Cardiovascular;  Laterality: N/A;  . LOWER VENOUS EXTREMITY DOPPLER  01/26/2011   No evidence of thrombus or thrombophlebitis.  Marland Kitchen RETINAL DETACHMENT SURGERY  2001   right  . TOTAL HIP ARTHROPLASTY  2012   bilateral  . TOTAL KNEE ARTHROPLASTY  2011, 2012   bilateral  . TRANSTHORACIC ECHOCARDIOGRAM  10/05/2009   EF >55%, normal LV size, systolic, and diastolic function.    Allergies  Allergen Reactions  . Lipitor [Atorvastatin] Other (See Comments)    Leg pain/cramps  . Red Yeast Rice [Cholestin] Other (See Comments)    Makes legs hurt  . Statins Other (See Comments)    "muscle break down"  . Codeine Other (See Comments)    Headache  . Latex Rash    Outpatient Encounter Medications as of 01/21/2019  Medication Sig  . amLODipine (NORVASC) 10 MG tablet TAKE 1 TABLET BY MOUTH EVERY DAY IN THE MORNING  . aspirin EC 81 MG tablet Take 81 mg by mouth every morning.  . benazepril (LOTENSIN) 40 MG tablet TAKE 1 TABLET BY MOUTH EVERY DAY IN THE MORNING  . brimonidine (ALPHAGAN P) 0.1 % SOLN Place 1 drop into the right eye 2 (two) times daily.  . dorzolamide-timolol (COSOPT) 22.3-6.8 MG/ML ophthalmic solution Place 2 drops into the right eye 2 (two) times daily.   Marland Kitchen esomeprazole (NEXIUM) 40 MG capsule TAKE 1 CAPSULE BY MOUTH AT BEDTIME. FOR REFLUX  . ezetimibe (ZETIA) 10 MG tablet Take 1 tablet (10 mg total) by mouth daily.  . famotidine (PEPCID) 20 MG tablet One at bedtime to reduce stomach acid  . finasteride (PROSCAR) 5 MG tablet TAKE 1 TABLET BY MOUTH EVERY DAY IN THE MORNING  . hydrochlorothiazide (HYDRODIURIL) 25 MG tablet TAKE 1 TABLET BY MOUTH EVERY DAY IN THE MORNING  . metoprolol tartrate (LOPRESSOR) 50 MG tablet Take 1 tablet by mouth once for procedure.  . Polyvinyl Alcohol-Povidone (REFRESH OP) Place 1 drop into the right eye daily as needed (dry eyes/irritation).  .  [DISCONTINUED] ezetimibe (ZETIA) 10 MG tablet TAKE 1 TABLET BY MOUTH EVERY DAY IN THE MORNING   No facility-administered encounter medications on file as of 01/21/2019.     Review of Systems:  Review of Systems  Constitutional: Negative for chills and fever.       Weight gain  HENT: Negative for congestion and hearing loss.   Eyes: Positive for redness. Negative for blurred vision.       Right eye gets  red if he misses his glaucoma drops even by a short time  Respiratory: Negative for cough and shortness of breath.   Cardiovascular: Positive for leg swelling. Negative for chest pain, palpitations, orthopnea and PND.  Gastrointestinal: Positive for heartburn. Negative for abdominal pain, diarrhea, nausea and vomiting.       Still using pepcid and ppi regularly  Genitourinary: Negative for dysuria.  Musculoskeletal: Positive for joint pain and myalgias. Negative for back pain, falls and neck pain.       Hips hurt when he walks  Skin: Negative for itching and rash.  Neurological: Negative for loss of consciousness and headaches.  Endo/Heme/Allergies: Does not bruise/bleed easily.  Psychiatric/Behavioral: Negative for depression and memory loss. The patient is not nervous/anxious and does not have insomnia.     Health Maintenance  Topic Date Due  . INFLUENZA VACCINE  03/23/2019  . TETANUS/TDAP  08/29/2021  . COLONOSCOPY  05/08/2022  . Hepatitis C Screening  Discontinued  . PNA vac Low Risk Adult  Discontinued    Physical Exam: Vitals:   01/21/19 0827  BP: 130/70  Pulse: 72  Temp: 98.4 F (36.9 C)  TempSrc: Oral  SpO2: 96%  Weight: 283 lb (128.4 kg)  Height: 5\' 10"  (1.778 m)   Body mass index is 40.61 kg/m. Physical Exam Vitals signs reviewed.  Constitutional:      General: He is not in acute distress.    Appearance: Normal appearance. He is obese. He is not toxic-appearing.  HENT:     Head: Normocephalic and atraumatic.  Cardiovascular:     Rate and Rhythm: Normal  rate and regular rhythm.     Pulses: Normal pulses.     Heart sounds: Normal heart sounds.  Pulmonary:     Effort: Pulmonary effort is normal.     Breath sounds: Normal breath sounds. No rales.  Musculoskeletal:     Right lower leg: Edema present.     Left lower leg: Edema present.     Comments: Cannot turn neck to left well since cervical fusion; 1+ edema  Skin:    General: Skin is warm and dry.     Capillary Refill: Capillary refill takes less than 2 seconds.  Neurological:     General: No focal deficit present.     Mental Status: He is alert and oriented to person, place, and time.     Motor: No weakness.     Gait: Gait normal.  Psychiatric:        Mood and Affect: Mood normal.        Behavior: Behavior normal.        Thought Content: Thought content normal.        Judgment: Judgment normal.     Labs reviewed: Basic Metabolic Panel: Recent Labs    02/19/18 1001 09/06/18 0811 10/22/18 0804  NA 140 141  --   K 3.4* 4.1  --   CL 107 101  --   CO2 25 25  --   GLUCOSE 100* 101*  --   BUN 9 17  --   CREATININE 0.92 0.99  --   CALCIUM 9.1 9.6  --   TSH  --   --  3.66   Liver Function Tests: No results for input(s): AST, ALT, ALKPHOS, BILITOT, PROT, ALBUMIN in the last 8760 hours. No results for input(s): LIPASE, AMYLASE in the last 8760 hours. No results for input(s): AMMONIA in the last 8760 hours. CBC: Recent Labs    02/19/18 1001  WBC 6.7  HGB 14.6  HCT 43.0  MCV 86.9  PLT 188   Lipid Panel: Recent Labs    06/18/18 0802 10/22/18 0804  CHOL 162 173  HDL 39* 46  LDLCALC 101* 103*  TRIG 123 143  CHOLHDL 4.2 3.8   Lab Results  Component Value Date   HGBA1C 5.3 10/22/2018    Procedures since last visit: No results found.  Assessment/Plan 1. Morbid obesity ([place]) - weight has trended up amid covid pandemic isolation - he reports eating three meals a day instead of his prior 2 meals--recommended smaller portion, low sodium diet, and getting back  to an exercise program -if weight not down 10 lbs by September, will refer to weight loss clinic - Basic metabolic panel; Future - Lipid panel; Future  2. Other hyperlipidemia - did have statin myopathy and cannot take statins - ezetimibe (ZETIA) 10 MG tablet; Take 1 tablet (10 mg total) by mouth daily.  Dispense: 90 tablet; Refill: 1 - Lipid panel; Future  3. Esophageal stenosis -is in need of dilation with Dr. Cristina Gong -cough post-cervical fusion has resolved  4. Essential hypertension -bp is controlled with current regimen,no changes needed - Basic metabolic panel; Future  5. Primary open angle glaucoma of right eye, unspecified glaucoma stage -continues drops per ophtho and twice yearly visits  6. Hyperglycemia -hba1c in normal range right now  7.  Statin myopathy -had this when he was on lipitor and other statins--is now on zetia alone as a result  Labs/tests ordered:   Orders Placed This Encounter  Procedures  . Basic metabolic panel    Standing Status:   Future    Standing Expiration Date:   01/21/2020    Order Specific Question:   Has the patient fasted?    Answer:   Yes  . Lipid panel    Standing Status:   Future    Standing Expiration Date:   01/21/2020    Order Specific Question:   Has the patient fasted?    Answer:   Yes    Next appt:  04/25/2019 f/u on weight, cholesterol (refer to weight loss clinic if weight not down 10 lbs by next visit)  Sadarius Norman L. Sabastian Raimondi, D.O. [facility] [address], [place] [address] Cell Phone (Mon-Fri 8am-5pm):  [phone] On Call:  [phone] & follow prompts after 5pm & weekends Office Phone:  [phone] Office Fax:  [phone]

## 2019-01-21 NOTE — Patient Instructions (Signed)
Try to avoid the sodas and salty foods.    Get back on track with some deliberate exercise.   Be careful about portion sizes.   We'll check back in September.  I'd like to see your weight down 10 lbs by the time we meet again.

## 2019-01-29 DIAGNOSIS — M47816 Spondylosis without myelopathy or radiculopathy, lumbar region: Secondary | ICD-10-CM | POA: Diagnosis not present

## 2019-02-17 ENCOUNTER — Other Ambulatory Visit: Payer: Self-pay | Admitting: Internal Medicine

## 2019-03-04 ENCOUNTER — Ambulatory Visit (INDEPENDENT_AMBULATORY_CARE_PROVIDER_SITE_OTHER): Payer: PPO | Admitting: Cardiovascular Disease

## 2019-03-04 ENCOUNTER — Other Ambulatory Visit: Payer: Self-pay

## 2019-03-04 ENCOUNTER — Encounter: Payer: Self-pay | Admitting: Cardiovascular Disease

## 2019-03-04 VITALS — BP 158/90 | HR 77 | Temp 97.9°F | Wt 282.6 lb

## 2019-03-04 DIAGNOSIS — E785 Hyperlipidemia, unspecified: Secondary | ICD-10-CM

## 2019-03-04 DIAGNOSIS — R600 Localized edema: Secondary | ICD-10-CM

## 2019-03-04 DIAGNOSIS — I2510 Atherosclerotic heart disease of native coronary artery without angina pectoris: Secondary | ICD-10-CM | POA: Diagnosis not present

## 2019-03-04 DIAGNOSIS — I10 Essential (primary) hypertension: Secondary | ICD-10-CM | POA: Diagnosis not present

## 2019-03-04 MED ORDER — FUROSEMIDE 40 MG PO TABS: ORAL_TABLET | ORAL | 3 refills | Status: DC

## 2019-03-04 MED ORDER — ROSUVASTATIN CALCIUM 5 MG PO TABS: 5.0000 mg | ORAL_TABLET | ORAL | 0 refills | Status: DC

## 2019-03-04 NOTE — Patient Instructions (Signed)
Medication Instructions:  STOP HCTZ Start Furosemide 40 mg (take a whole tablet for 2 days, then cut 1/2 to 20 mg) Start Rosuvastatin 5 mg once weekly Continue Zetia daily If you need a refill on your cardiac medications before your next appointment, please call your pharmacy.   Follow-Up: At [facility], you and your health needs are our priority.  As part of our continuing mission to provide you with exceptional heart care, we have created designated Provider Care Teams.  These Care Teams include your primary Cardiologist (physician) and Advanced Practice Providers (APPs -  Physician Assistants and Nurse Practitioners) who all work together to provide you with the care you need, when you need it. You will need a follow up appointment in 6 months.  Please call our office 2 months in advance to schedule this appointment.  You may see Shelva Majestic, MD or one of the following Advanced Practice Providers on your designated Care Team: [place], [place] . Fabian Sharp, PA-C

## 2019-03-04 NOTE — Progress Notes (Signed)
Patient ID: Anthony Lonzo., male   DOB: 21-Oct-1950, 68 y.o.   MRN: [id number]     HPI: Anthony [name]. is a 68 y.o. male who presents to the office for a 6 month  follow-up cardiology evaluation.   Anthony [name] has a history of hypertension, mild obesity, as well as hyperlipidemia. In the past, he did develop CPK elevation secondary to statin therapy. He has been able to tolerate Zetia. A nuclear perfusion study done in October 2013 for chest pain showed normal perfusion without scar or ischemia.   Laboratory in October 2014 showed cholesterol 196, triglycerides significantly elevated at 291, HDL of 34, VLDL increased at 58, and  LDL 106. Renal function was normal. BUN 15 kerning 0.79.   He developed recurrent episodes of chest pain for which he saw Anthony [name] and because of exertionally precipitated episodes of discomfort definitive cardiac catheterization was recommended. This was done by me on 09/05/2013 and revealed normal coronary arteries with normal LV function without focal segmental wall motion abnormalities. Ejection fraction was 55%.  He underwent an echo Doppler study on 09/04/2013 which showed an ejection fraction of 55-60%. He did have grade 1 diastolic dysfunction and mild left ventricular hypertrophy. He had mild mitral annular calcification with mildly thickened leaflets and trivial mitral regurgitation. A moderate pressure estimate was 27 mm.  He was  seen by Anthony [name] on 08/17/2016 with complaints of chest discomfort.  He's episodes would occur often when he was walking on his driveway her in the grocery store and had been ongoing for 1-2 months.  He had diffuse tenderness to palpation along his left pectoral region.  On exam it was felt that some of his chest pain was musculoskeletal in etiology.  He was hypertensive and she further titrated amlodipine.  Because of exertional dyspnea.  She referred him for an echo Doppler study which was done on 08/23/2016.  This showed  mild LVH with normal systolic function and grade 1 diastolic dysfunction.  There is very mild increased peak PA pressure 34 mm.  I saw him in January 2019 after not having seen him since February 2015.  At that time, his pressure was elevated despite taking amlodipine 7.5 mg.  I recommended further titration to 10 mg daily.  On this increased dose, he states his blood pressure at home typically has been running in the 130-135 range.  He denies chest pain PND orthopnea.  He has a history of GERD which is controlled with Nexium.  He has had some issues with left arm paresthesias and was diagnosed with significant cervical degenerative disc disease.  He is scheduled to undergo neck surgery on December 12, 2017 by Anthony [name].  Preoperative surgical clearance was recommended.  He denies any chest pain or shortness of breath.  He has not been exercising as he had in the past in the winter months but hopes to do so following his surgery and with improved weather.    When I saw him in April 2019 he was given surgical clearance for his cervical surgery.  He underwent successful surgery by Anthony [name] involving C4-5 and C5-6 and tolerated this well.   I last saw him in January 2020 at that time he had begun to notice some episodes of chest wall discomfort.  He denied any clear-cut exertional precipitation and noted the chest discomfort intermittently.  He was unable to walk well secondary to bilateral knee and hip replacement  surgery.  He also had experience of mild exertional shortness of breath.  I felt most likely that his chest pain was of musculoskeletal etiology.  However with his exertional dyspnea I recommended reevaluation of his coronary anatomy and suggested CT coronary angiography and also a 2-year follow-up echo Doppler assessment.  His echo study was done on August 30, 2018 and showed an EF of 60 to 65% without wall motion abnormalities.  There was grade 2 diastolic  dysfunction.  There was mild increased PA pressure at 35 mm.  There was mild MR.  Coronary CTA yielded calcium score of 26.4 which was 20th percentile for age and sex matched control.  He had normal coronary origin with right dominance.  There was minimal plaque in the ostial LAD and OM1.  CT demonstrated atherosclerotic disease in the descending thoracic aorta.  Presently he denies chest pain palpitations or shortness of breath.  He has been noticing bilateral leg swelling despite taking HCTZ.  During this COVID pandemic, he has gained approximately 15 pounds.  He has not been exercising due to hip discomfort.  Laboratory in March 2020 showed a total cholesterol 173, triglycerides 143 HDL 46 and LDL 103.  He presents for evaluation.  Past Medical History:  Diagnosis Date   Benign neoplasm of colon    Benign neoplasm of skin of upper limb, including shoulder    Carpal tunnel syndrome    Chest pain    a. normal cors by cath in 2015   Diaphragmatic hernia without mention of obstruction or gangrene    Dyslipidemia    Elevated prostate specific antigen (PSA)    GERD (gastroesophageal reflux disease)    Herpes zoster without mention of complication    History of hiatal hernia    Hypertension    Hypertrophy of prostate with urinary obstruction and other lower urinary tract symptoms (LUTS)    Impotence of organic origin    Lumbago    Obesity, unspecified    Osteoarthrosis, unspecified whether generalized or localized, lower leg    Other and unspecified hyperlipidemia    Other malaise and fatigue    Other seborrheic keratosis    Special screening for malignant neoplasm of prostate    Stenosis, cervical spine    Unspecified tinnitus     Past Surgical History:  Procedure Laterality Date   ANTERIOR CERVICAL DECOMP/DISCECTOMY FUSION N/A 02/19/2018   Procedure: ANTERIOR CERVICAL DECOMPRESSION/DISCECTOMY FUSION - CERVICAL FOUR-CERVICAL FIVE - CERVICAL FIVE-CERVICAL SIX;   Surgeon: Anthony Larsson, MD;  Location: [place];  Service: Neurosurgery;  Laterality: N/A;   CARDIOVASCULAR STRESS TEST  06/15/2012   Non-diagnostic for ischemia. No lexiscan EKG changes.   CHOLECYSTECTOMY  1992   EYE SURGERY     eyeband  2006   removal of right eyeband   INGUINAL HERNIA REPAIR  1984   left   INTRAOCULAR LENS REMOVAL  2008   right   LEFT HEART CATHETERIZATION WITH CORONARY ANGIOGRAM N/A 09/05/2013   Procedure: LEFT HEART CATHETERIZATION WITH CORONARY ANGIOGRAM;  Surgeon: Anthony Sine, MD;  Location: [facility] CATH LAB;  Service: Cardiovascular;  Laterality: N/A;   LOWER VENOUS EXTREMITY DOPPLER  01/26/2011   No evidence of thrombus or thrombophlebitis.   RETINAL DETACHMENT SURGERY  2001   right   TOTAL HIP ARTHROPLASTY  2012   bilateral   TOTAL KNEE ARTHROPLASTY  2011, 2012   bilateral   TRANSTHORACIC ECHOCARDIOGRAM  10/05/2009   EF >55%, normal LV size, systolic, and diastolic function.    Allergies  Allergen Reactions   Lipitor [Atorvastatin] Other (See Comments)    Leg pain/cramps; statin myopathy   Red Yeast Rice [Cholestin] Other (See Comments)    Makes legs hurt, myopathy   Statins Other (See Comments)    "muscle break down", statin myopathy   Codeine Other (See Comments)    Headache   Latex Rash    Current Outpatient Medications  Medication Sig Dispense Refill   amLODipine (NORVASC) 10 MG tablet TAKE 1 TABLET BY MOUTH EVERY DAY IN THE MORNING 90 tablet 3   aspirin EC 81 MG tablet Take 81 mg by mouth every morning.     benazepril (LOTENSIN) 40 MG tablet TAKE 1 TABLET BY MOUTH EVERY DAY IN THE MORNING 90 tablet 1   brimonidine (ALPHAGAN P) 0.1 % SOLN Place 1 drop into the right eye 2 (two) times daily.     dorzolamide-timolol (COSOPT) 22.3-6.8 MG/ML ophthalmic solution Place 2 drops into the right eye 2 (two) times daily.      esomeprazole (NEXIUM) 40 MG capsule TAKE 1 CAPSULE BY MOUTH AT BEDTIME. FOR REFLUX 90 capsule 1   ezetimibe  (ZETIA) 10 MG tablet Take 1 tablet (10 mg total) by mouth daily. 90 tablet 1   famotidine (PEPCID) 20 MG tablet One at bedtime to reduce stomach acid 30 tablet 5   finasteride (PROSCAR) 5 MG tablet TAKE 1 TABLET BY MOUTH EVERY DAY IN THE MORNING 90 tablet 1   Polyvinyl Alcohol-Povidone (REFRESH OP) Place 1 drop into the right eye daily as needed (dry eyes/irritation).     furosemide (LASIX) 40 MG tablet Take 1 tablet by mouth for 2 days, then 1/2 tablet (20 mg) daily 90 tablet 3   metoprolol tartrate (LOPRESSOR) 50 MG tablet Take 1 tablet by mouth once for procedure. (Patient not taking: Reported on 03/04/2019) 2 tablet 0   rosuvastatin (CRESTOR) 5 MG tablet Take 1 tablet (5 mg total) by mouth once a week. 30 tablet 0   No current facility-administered medications for this visit.     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 strain: Not hard at all   Food insecurity    Worry: Never true    Inability: Never true   Transportation needs    Medical: No    Non-medical: No  Tobacco Use   Smoking status: Never Smoker   Smokeless tobacco: Never Used  Substance and Sexual Activity   Alcohol use: No   Drug use: No   Sexual activity: Yes    Partners: Female    Comment: wife  Lifestyle   Physical activity    Days per week: 2 days    Minutes per session: 60 min   Stress: Only a little  Relationships   Social connections    Talks on phone: Twice a week    Gets together: Twice a week    Attends religious service: More than 4 times per year    Active member of club or organization: No    Attends meetings of clubs or organizations: Never    Relationship status: Married   Intimate  partner violence    Fear of current or ex partner:  No    Emotionally abused: No    Physically abused: No    Forced sexual activity: No  Other Topics Concern   Not on file  Social History Narrative   Married   Never smoked   Alcohol none   Exercise -gym 3 days a week (join 3 months ago)   Living Will   Social is notable that he is married has 2 children and 2 grandchildren. He is not routinely exercise. There is no tobacco use. He does drink occasional alcohol.  Family History  Problem Relation Age of Onset   Diabetes Mother    Arrhythmia Mother    Hypertension Mother    Dementia Father    Pulmonary embolism Father    Hypertension Father    Cancer Sister        Liver cancer - Histocytic lymphoma   Cancer Sister 59       Skin cancer   Hypertension Sister 34   Diabetes Sister    Stroke Brother    Diabetes Brother    Hypertension Brother    Cancer Daughter    Cancer Maternal Grandmother        Skin cancer   Heart attack Maternal Grandfather    Cancer Paternal Grandfather    Hypertension Paternal Grandfather     ROS General: Negative; No fevers, chills, or night sweats;  positive for a 16 pound weight loss in September 2018 HEENT: Negative; No changes in vision or hearing, sinus congestion, difficulty swallowing Pulmonary: Negative; No cough, wheezing, shortness of breath, hemoptysis Cardiovascular: Positive for occasional exertional shortness of breath.  No exertional chest pain. GI: Positive for GERD  GU: Negative; No dysuria, hematuria, or difficulty voiding Musculoskeletal: Lateral hip and knee discomfort; status post recent surgery C4-5/C5-6 Hematologic/Oncology: Negative; no easy bruising, bleeding Endocrine: Negative; no heat/cold intolerance; no diabetes Neuro: Mild left arm paresthesias from his cervical degenerative disease Skin: Negative; No rashes or skin lesions Psychiatric: Negative; No behavioral problems, depression Sleep: Negative;  No snoring, daytime sleepiness, hypersomnolence, bruxism, restless legs, hypnogognic hallucinations, no cataplexy Other comprehensive 14 point system review is negative.  PE BP (!) 158/90    Pulse 77    Temp 97.9 F (36.6 C)    Wt 282 lb 9.6 oz (128.2 kg)    SpO2 96%    BMI 40.55 kg/m    Repeat blood pressure by me was 128/82  Wt Readings from Last 3 Encounters:  03/04/19 282 lb 9.6 oz (128.2 kg)  01/21/19 283 lb (128.4 kg)  08/27/18 267 lb 12.8 oz (121.5 kg)   General: Alert, oriented, no distress.  Skin: normal turgor, no rashes, warm and dry HEENT: Normocephalic, atraumatic. Pupils equal round and reactive to light; sclera anicteric; extraocular muscles intact;  Nose without nasal septal hypertrophy Mouth/Parynx benign;  Neck: No JVD, no carotid bruits; normal carotid upstroke Lungs: clear to ausculatation and percussion; no wheezing or rales Chest wall: without tenderness to palpitation Heart: PMI not displaced, RRR, s1 s2 normal, 1/6 systolic murmur, no diastolic murmur, no rubs, gallops, thrills, or heaves Abdomen: soft, nontender; no hepatosplenomehaly, BS+; abdominal aorta nontender and not dilated by palpation. Back: no CVA tenderness Pulses 2+ Musculoskeletal: full range of motion, normal strength, no joint deformities Extremities: 1+ bilateral lower extremity edema, no clubbing, cyanosis , Homan's sign negative  Neurologic: grossly nonfocal; Cranial nerves grossly wnl Psychologic: Normal mood and affect   ECG (independently read by me): NSR at 70  January 2020 ECG (independently read by me): Normal  sinus rhythm at 74 bpm.  No ectopy.  No ST segment changes.  April 2019 ECG (independently read by me): Normal sinus rhythm at 66 bpm.  Normal intervals.  No ectopy.  No ST segment changes.  August 28, 2017 ECG (independently read by me): Normal sinus rhythm at 63 bpm.  Normal intervals.  No ST segment changes.  February 2015 ECG (independently read by me): Normal sinus  rhythm at 66 beats per minute. PR interval 160 ms. QTC intervals 34 ms. No significant ST-T changes.  Prior ECG from November 2014 :Normal sinus rhythm at 69 beats per minute. No ectopy. Normal intervals.  LABS:  BMP Latest Ref Rng & Units 09/06/2018 02/19/2018 11/29/2017  Glucose 65 - 99 mg/dL 101(H) 100(H) 110(H)  BUN 8 - 27 mg/dL '17 9 19  ' Creatinine 0.76 - 1.27 mg/dL 0.99 0.92 0.95  BUN/Creat Ratio 10 - 24 17 - -  Sodium 134 - 144 mmol/L 141 140 140  Potassium 3.5 - 5.2 mmol/L 4.1 3.4(L) 3.8  Chloride 96 - 106 mmol/L 101 107 106  CO2 20 - 29 mmol/L '25 25 25  ' Calcium 8.6 - 10.2 mg/dL 9.6 9.1 9.3   CBC Latest Ref Rng & Units 02/19/2018 11/29/2017 10/31/2017  WBC 4.0 - 10.5 K/uL 6.7 6.7 6.9  Hemoglobin 13.0 - 17.0 g/dL 14.6 15.1 15.2  Hematocrit 39.0 - 52.0 % 43.0 44.4 43.9  Platelets 150 - 400 K/uL 188 199 195   Lab Results  Component Value Date   TSH 3.66 10/22/2018    Lab Results  Component Value Date   HGBA1C 5.3 10/22/2018   Hepatic Function Latest Ref Rng & Units 10/31/2017 05/01/2017 10/27/2016  Total Protein 6.1 - 8.1 g/dL 5.8(L) 5.6(L) 5.9(L)  Albumin 3.6 - 5.1 g/dL - - 4.1  AST 10 - 35 U/L '23 17 17  ' ALT 9 - 46 U/L '21 23 24  ' Alk Phosphatase 40 - 115 U/L - - 71  Total Bilirubin 0.2 - 1.2 mg/dL 0.6 0.5 0.6   Lipid Panel     Component Value Date/Time   CHOL 173 10/22/2018 0804   CHOL 175 01/25/2016 0825   CHOL 191 07/22/2013 0835   TRIG 143 10/22/2018 0804   TRIG 253 (H) 07/22/2013 0835   HDL 46 10/22/2018 0804   HDL 46 01/25/2016 0825   HDL 38 (L) 07/22/2013 0835   CHOLHDL 3.8 10/22/2018 0804   VLDL 35 (H) 10/27/2016 0825   LDLCALC 103 (H) 10/22/2018 0804   LDLCALC 102 (H) 07/22/2013 5726    RADIOLOGY: No results found.  IMPRESSION:  1. Essential hypertension   2. Atherosclerosis of native coronary artery of native heart without angina pectoris   3. Hyperlipidemia with target LDL less than 70   4. Morbid obesity ([place])   5. Lower extremity edema     ASSESSMENT AND PLAN: Mr. Bartolomei is a 68 year-old gentleman who has a history of moderate obesity, hyperlipidemia, hypertension, and GERD.  He had developed chest discomfort 2015 which ultimately led to definitive cardiac catheterization.  This revealed normal coronary arteries.  He  underwent successful surgery by Anthony [name] and tolerated this well from a cardiac standpoint.  He had developed some episodes of chest pain which most likely were of musculoskeletal etiology.  He underwent a recent CTA which again shows minimal coronary plaque involving his LAD ostium and OM1 vessel.  He also was noted to have atherosclerosis of his thoracic aorta.  His blood pressure today is  stable on recheck by me although was elevated upon presentation.  Is been on amlodipine 10 mg, benazepril 40 mg and HCTZ.  He has noticed lower extremity leg swelling.  I have suggested discontinuance of HCTZ and institute furosemide 40 mg for the next 2 days then 20 mg daily.  With his aortic atherosclerosis as well as mild coronary plaque I have suggested perhaps a slight trial of rosuvastatin 5 mg 1 day/week to take with his Zetia.  If he develops any muscle tenderness this will need to be discontinued.  In the past on higher dose daily statin therapy he did have CPK elevation.  Alternatively, he may be a candidate for benpedoic which is recently been approved.  Follow-up laboratory will be done with Anthony [name] in September.  I discussed the importance of increasing activity and weight reduction since he has gained 15 pounds since his January evaluation and is now morbidly obese with a BMI of 40.55.  I will see him in 6 months for follow-up evaluation   Time spent: 25 minutes Anthony Sine, MD, [facility]  03/06/2019 4:43 PM

## 2019-03-06 ENCOUNTER — Encounter: Payer: Self-pay | Admitting: Cardiovascular Disease

## 2019-03-07 ENCOUNTER — Other Ambulatory Visit (HOSPITAL_COMMUNITY): Payer: Self-pay | Admitting: Orthopedic Surgery

## 2019-03-07 ENCOUNTER — Other Ambulatory Visit: Payer: Self-pay | Admitting: Orthopedic Surgery

## 2019-03-07 DIAGNOSIS — Z96652 Presence of left artificial knee joint: Secondary | ICD-10-CM | POA: Diagnosis not present

## 2019-03-07 DIAGNOSIS — M25562 Pain in left knee: Secondary | ICD-10-CM

## 2019-03-14 DIAGNOSIS — M25562 Pain in left knee: Secondary | ICD-10-CM | POA: Diagnosis not present

## 2019-03-14 DIAGNOSIS — Z96652 Presence of left artificial knee joint: Secondary | ICD-10-CM | POA: Diagnosis not present

## 2019-03-15 ENCOUNTER — Encounter (HOSPITAL_COMMUNITY)
Admission: RE | Admit: 2019-03-15 | Discharge: 2019-03-15 | Disposition: A | Discharge status: Home / Self Care | Payer: PPO | Source: Ambulatory Visit | Attending: Orthopedic Surgery | Admitting: Orthopedic Surgery

## 2019-03-15 ENCOUNTER — Other Ambulatory Visit: Payer: Self-pay | Admitting: Cardiovascular Disease

## 2019-03-15 ENCOUNTER — Other Ambulatory Visit: Payer: Self-pay

## 2019-03-15 DIAGNOSIS — M25562 Pain in left knee: Secondary | ICD-10-CM | POA: Insufficient documentation

## 2019-03-15 DIAGNOSIS — R948 Abnormal results of function studies of other organs and systems: Secondary | ICD-10-CM | POA: Diagnosis not present

## 2019-03-15 MED ORDER — TECHNETIUM TC 99M MEDRONATE IV KIT
20.0000 | PACK | Freq: Once | INTRAVENOUS | Status: AC | PRN
  Administered 2019-03-15: 10:00:00 20 via INTRAVENOUS

## 2019-03-26 ENCOUNTER — Other Ambulatory Visit: Payer: Self-pay | Admitting: Cardiovascular Disease

## 2019-03-26 DIAGNOSIS — M25562 Pain in left knee: Secondary | ICD-10-CM | POA: Diagnosis not present

## 2019-03-27 NOTE — Telephone Encounter (Signed)
Rx(s) sent to pharmacy electronically.  

## 2019-03-27 NOTE — Telephone Encounter (Signed)
Please review for refill. Due to past HX of Muscle aches.

## 2019-04-09 ENCOUNTER — Other Ambulatory Visit: Payer: Self-pay | Admitting: Cardiovascular Disease

## 2019-04-23 ENCOUNTER — Other Ambulatory Visit: Payer: Self-pay

## 2019-04-23 ENCOUNTER — Other Ambulatory Visit: Payer: PPO

## 2019-04-23 DIAGNOSIS — I10 Essential (primary) hypertension: Secondary | ICD-10-CM | POA: Diagnosis not present

## 2019-04-23 DIAGNOSIS — E7849 Other hyperlipidemia: Secondary | ICD-10-CM | POA: Diagnosis not present

## 2019-04-23 LAB — BASIC METABOLIC PANEL
BUN: 14 mg/dL (ref 7–25)
CO2: 28 mmol/L (ref 20–32)
Calcium: 9.1 mg/dL (ref 8.6–10.3)
Chloride: 109 mmol/L (ref 98–110)
Creat: 0.98 mg/dL (ref 0.70–1.18)
Glucose, Bld: 111 mg/dL — ABNORMAL HIGH (ref 65–99)
Potassium: 3.9 mmol/L (ref 3.5–5.3)
Sodium: 142 mmol/L (ref 135–146)

## 2019-04-23 LAB — LIPID PANEL
Cholesterol: 180 mg/dL (ref <200)
HDL: 42 mg/dL (ref >=40)
LDL Cholesterol (Calc): 113 mg/dL (calc) — ABNORMAL HIGH
Non-HDL Cholesterol (Calc): 138 mg/dL (calc) — ABNORMAL HIGH (ref <130)
Total CHOL/HDL Ratio: 4.3 (calc) (ref <5.0)
Triglycerides: 135 mg/dL (ref <150)

## 2019-04-25 ENCOUNTER — Ambulatory Visit (INDEPENDENT_AMBULATORY_CARE_PROVIDER_SITE_OTHER): Payer: PPO | Admitting: Internal Medicine

## 2019-04-25 ENCOUNTER — Other Ambulatory Visit: Payer: Self-pay

## 2019-04-25 ENCOUNTER — Encounter: Payer: Self-pay | Admitting: Internal Medicine

## 2019-04-25 DIAGNOSIS — E7849 Other hyperlipidemia: Secondary | ICD-10-CM | POA: Diagnosis not present

## 2019-04-25 DIAGNOSIS — G8929 Other chronic pain: Secondary | ICD-10-CM

## 2019-04-25 DIAGNOSIS — M25562 Pain in left knee: Secondary | ICD-10-CM | POA: Diagnosis not present

## 2019-04-25 DIAGNOSIS — Z23 Encounter for immunization: Secondary | ICD-10-CM

## 2019-04-25 DIAGNOSIS — R739 Hyperglycemia, unspecified: Secondary | ICD-10-CM

## 2019-04-25 DIAGNOSIS — I10 Essential (primary) hypertension: Secondary | ICD-10-CM | POA: Diagnosis not present

## 2019-04-25 NOTE — Patient Instructions (Addendum)
Try your best to cut out the sodas and sweet teas.  Be careful with portion sizes of starchy foods like pasta, rice, bread, potatoes.  Avoid fried items.  Be sure to eat more leafy greens and veggies.    Do just short walks halfway down the road starting off with three times a week. If you can get back to the gym safely, you may do that.    I'd like to see your weight down 10 lbs before we meet next in early December.

## 2019-04-25 NOTE — Progress Notes (Signed)
Location:  [facility] clinic Provider:  Raynor Calcaterra L. Mariea Clonts, D.O., C.M.D.   Goals of Care:  Advanced Directives 04/30/2018  Does Patient Have a Medical Advance Directive? Yes  Type of Advance Directive Living will  Does patient want to make changes to medical advance directive? No - Patient declined  Copy of [place] in Chart? -  Would patient like information on creating a medical advance directive? -     Chief Complaint  Patient presents with  . Medical Management of Chronic Issues    88mth follow-up    HPI: Patient is a 68 y.o. male seen today for medical management of chronic diseases.    There was concern that his left knee was coming loose, but it turned out on bone scan that it did not warrant surgery.  He wants to avoid surgery.  Aleve was bothering his stomach.  He was using CBD cream on his knee.  He is considering a patch instead.    Knees and hips have limited his ability to walk up and down the street--can only go halfway down and back and gyms have been closed.  We reviewed that goal was to lose 10 lbs by this visit.  He helped in the camp kitchen for 13 hrs.  His legs blew up like balloons.  Dr. Claiborne Billings put him on a fluid pill 1/2 and now can use a full one prn.  He's trying to watch his sodium intake.  He's no longer drinking daily soda.  If he has a bad day, he'll drink one of the really little ones that's about 2 swallows.  Says water doesn't quench his thirst if he's working in the yard and he ends up grabbing something else--drank some ocean spray sugar-free something.  Does drink quite a bit of black coffee--4-6 cups in the am.  Does not drink tea except if out.    Discussed idea of healthy weight and wellness clinic idea.  He wants to try more on his own with the gym option.  He admits he says it every time.    Past Medical History:  Diagnosis Date  . Benign neoplasm of colon   . Benign neoplasm of skin of upper limb, including shoulder   . Carpal  tunnel syndrome   . Chest pain    a. normal cors by cath in 2015  . Diaphragmatic hernia without mention of obstruction or gangrene   . Dyslipidemia   . Elevated prostate specific antigen (PSA)   . GERD (gastroesophageal reflux disease)   . Herpes zoster without mention of complication   . History of hiatal hernia   . Hypertension   . Hypertrophy of prostate with urinary obstruction and other lower urinary tract symptoms (LUTS)   . Impotence of organic origin   . Lumbago   . Obesity, unspecified   . Osteoarthrosis, unspecified whether generalized or localized, lower leg   . Other and unspecified hyperlipidemia   . Other malaise and fatigue   . Other seborrheic keratosis   . Special screening for malignant neoplasm of prostate   . Stenosis, cervical spine   . Unspecified tinnitus     Past Surgical History:  Procedure Laterality Date  . ANTERIOR CERVICAL DECOMP/DISCECTOMY FUSION N/A 02/19/2018   Procedure: ANTERIOR CERVICAL DECOMPRESSION/DISCECTOMY FUSION - CERVICAL FOUR-CERVICAL FIVE - CERVICAL FIVE-CERVICAL SIX;  Surgeon: Earnie Larsson, MD;  Location: [place];  Service: Neurosurgery;  Laterality: N/A;  . CARDIOVASCULAR STRESS TEST  06/15/2012   Non-diagnostic for ischemia.  No lexiscan EKG changes.  . CHOLECYSTECTOMY  1992  . EYE SURGERY    . eyeband  2006   removal of right eyeband  . INGUINAL HERNIA REPAIR  1984   left  . INTRAOCULAR LENS REMOVAL  2008   right  . LEFT HEART CATHETERIZATION WITH CORONARY ANGIOGRAM N/A 09/05/2013   Procedure: LEFT HEART CATHETERIZATION WITH CORONARY ANGIOGRAM;  Surgeon: Troy Sine, MD;  Location: [facility] CATH LAB;  Service: Cardiovascular;  Laterality: N/A;  . LOWER VENOUS EXTREMITY DOPPLER  01/26/2011   No evidence of thrombus or thrombophlebitis.  Marland Kitchen RETINAL DETACHMENT SURGERY  2001   right  . TOTAL HIP ARTHROPLASTY  2012   bilateral  . TOTAL KNEE ARTHROPLASTY  2011, 2012   bilateral  . TRANSTHORACIC ECHOCARDIOGRAM  10/05/2009   EF >55%, normal  LV size, systolic, and diastolic function.    Allergies  Allergen Reactions  . Lipitor [Atorvastatin] Other (See Comments)    Leg pain/cramps; statin myopathy  . Red Yeast Rice [Cholestin] Other (See Comments)    Makes legs hurt, myopathy  . Statins Other (See Comments)    "muscle break down", statin myopathy  . Codeine Other (See Comments)    Headache  . Latex Rash    Outpatient Encounter Medications as of 04/25/2019  Medication Sig  . amLODipine (NORVASC) 10 MG tablet TAKE 1 TABLET BY MOUTH EVERY DAY IN THE MORNING  . aspirin EC 81 MG tablet Take 81 mg by mouth every morning.  . benazepril (LOTENSIN) 40 MG tablet TAKE 1 TABLET BY MOUTH EVERY DAY IN THE MORNING  . brimonidine (ALPHAGAN P) 0.1 % SOLN Place 1 drop into the right eye 2 (two) times daily.  . dorzolamide-timolol (COSOPT) 22.3-6.8 MG/ML ophthalmic solution Place 2 drops into the right eye 2 (two) times daily.   Marland Kitchen esomeprazole (NEXIUM) 40 MG capsule TAKE 1 CAPSULE BY MOUTH AT BEDTIME. FOR REFLUX  . ezetimibe (ZETIA) 10 MG tablet Take 1 tablet (10 mg total) by mouth daily.  . famotidine (PEPCID) 20 MG tablet One at bedtime to reduce stomach acid  . finasteride (PROSCAR) 5 MG tablet TAKE 1 TABLET BY MOUTH EVERY DAY IN THE MORNING  . furosemide (LASIX) 40 MG tablet Take 1 tablet by mouth for 2 days, then 1/2 tablet (20 mg) daily  . Polyvinyl Alcohol-Povidone (REFRESH OP) Place 1 drop into the right eye daily as needed (dry eyes/irritation).  . rosuvastatin (CRESTOR) 5 MG tablet TAKE 1 TABLET (5 MG TOTAL) BY MOUTH ONCE A WEEK.  . [DISCONTINUED] metoprolol tartrate (LOPRESSOR) 50 MG tablet Take 1 tablet by mouth once for procedure. (Patient not taking: Reported on 03/04/2019)   No facility-administered encounter medications on file as of 04/25/2019.     Review of Systems:  Review of Systems  Constitutional: Negative for chills, fever and malaise/fatigue.  HENT: Negative for hearing loss.   Eyes:       Glaucoma  Respiratory:  Negative for cough and shortness of breath.   Cardiovascular: Negative for chest pain, palpitations and leg swelling.  Gastrointestinal: Negative for abdominal pain, constipation, diarrhea, heartburn, nausea and vomiting.  Genitourinary: Negative for dysuria.  Musculoskeletal: Positive for joint pain. Negative for falls and myalgias.  Skin: Negative for itching and rash.  Neurological: Negative for dizziness and loss of consciousness.  Endo/Heme/Allergies: Bruises/bleeds easily.  Psychiatric/Behavioral: Negative for depression and memory loss. The patient is not nervous/anxious and does not have insomnia.     Health Maintenance  Topic Date Due  . INFLUENZA  VACCINE  03/23/2019  . TETANUS/TDAP  08/29/2021  . COLONOSCOPY  05/08/2022  . Hepatitis C Screening  Discontinued  . PNA vac Low Risk Adult  Discontinued    Physical Exam: Vitals:   04/25/19 0837  BP: 120/72  Pulse: 74  Temp: (!) 97.5 F (36.4 C)  TempSrc: Oral  SpO2: 97%  Weight: 279 lb (126.6 kg)  Height: 5\' 10"  (1.778 m)   Body mass index is 40.03 kg/m. Physical Exam Vitals signs reviewed.  Constitutional:      Appearance: Normal appearance. He is obese.  HENT:     Head: Normocephalic and atraumatic.  Cardiovascular:     Rate and Rhythm: Normal rate and regular rhythm.     Pulses: Normal pulses.     Heart sounds: Normal heart sounds.  Pulmonary:     Effort: Pulmonary effort is normal.     Breath sounds: Normal breath sounds. No wheezing, rhonchi or rales.  Abdominal:     General: Bowel sounds are normal.  Musculoskeletal: Normal range of motion.        General: Tenderness present.     Right lower leg: No edema.     Left lower leg: No edema.     Comments: Left knee  Skin:    General: Skin is warm and dry.     Capillary Refill: Capillary refill takes less than 2 seconds.  Neurological:     General: No focal deficit present.     Mental Status: He is alert and oriented to person, place, and time.    Psychiatric:        Mood and Affect: Mood normal.        Behavior: Behavior normal.        Thought Content: Thought content normal.        Judgment: Judgment normal.     Labs reviewed: Basic Metabolic Panel: Recent Labs    09/06/18 0811 10/22/18 0804 04/23/19 0818  NA 141  --  142  K 4.1  --  3.9  CL 101  --  109  CO2 25  --  28  GLUCOSE 101*  --  111*  BUN 17  --  14  CREATININE 0.99  --  0.98  CALCIUM 9.6  --  9.1  TSH  --  3.66  --    Liver Function Tests: No results for input(s): AST, ALT, ALKPHOS, BILITOT, PROT, ALBUMIN in the last 8760 hours. No results for input(s): LIPASE, AMYLASE in the last 8760 hours. No results for input(s): AMMONIA in the last 8760 hours. CBC: No results for input(s): WBC, NEUTROABS, HGB, HCT, MCV, PLT in the last 8760 hours. Lipid Panel: Recent Labs    06/18/18 0802 10/22/18 0804 04/23/19 0818  CHOL 162 173 180  HDL 39* 46 42  LDLCALC 101* 103* 113*  TRIG 123 143 135  CHOLHDL 4.2 3.8 4.3   Lab Results  Component Value Date   HGBA1C 5.3 10/22/2018    Procedures since last visit: No results found.  Assessment/Plan 1. Morbid obesity ([place]) - counseled extensively today on diet and exercise - Lipid panel; Future - Hemoglobin A1c; Future - CBC with Differential/Platelet; Future - COMPLETE METABOLIC PANEL WITH GFR; Future  2. Other hyperlipidemia - continue crestor therapy and work on diet - Lipid panel; Future  3. Essential hypertension -bp at goal with current therapy, no changes - CBC with Differential/Platelet; Future - COMPLETE METABOLIC PANEL WITH GFR; Future  4. Hyperglycemia - sugar average borderline -counseled on diet and  exercise extensively -suggested healthy weight and wellness center, but he declined - Hemoglobin A1c; Future - COMPLETE METABOLIC PANEL WITH GFR; Future  5. Chronic pain of left knee -is using cbd topically with benefit now, may also use tylenol, avoid the aleve that was bothering his  stomach and can negatively affect his kidneys  6. Need for influenza vaccination -high dose given today  Labs/tests ordered:   Lab Orders     Lipid panel     Hemoglobin A1c     CBC with Differential/Platelet     COMPLETE METABOLIC PANEL WITH GFR   Next appt:  3 mos for CPE/annual exam, also schedule AWV; fasting labs before visit with me   Chanti Golubski L. Ramello Cordial, D.O. [facility] [address], [place] [address] Cell Phone (Mon-Fri 8am-5pm):  [phone] On Call:  [phone] & follow prompts after 5pm & weekends Office Phone:  [phone] Office Fax:  [phone]

## 2019-04-25 NOTE — Addendum Note (Signed)
Addended by: Despina Hidden on: 04/25/2019 11:53 AM   Modules accepted: Orders

## 2019-05-01 ENCOUNTER — Other Ambulatory Visit: Payer: Self-pay | Admitting: Internal Medicine

## 2019-06-04 DIAGNOSIS — H26492 Other secondary cataract, left eye: Secondary | ICD-10-CM | POA: Diagnosis not present

## 2019-06-04 DIAGNOSIS — H40051 Ocular hypertension, right eye: Secondary | ICD-10-CM | POA: Diagnosis not present

## 2019-06-04 DIAGNOSIS — H40053 Ocular hypertension, bilateral: Secondary | ICD-10-CM | POA: Diagnosis not present

## 2019-07-22 ENCOUNTER — Other Ambulatory Visit: Payer: PPO

## 2019-07-22 ENCOUNTER — Other Ambulatory Visit: Payer: Self-pay

## 2019-07-22 DIAGNOSIS — E7849 Other hyperlipidemia: Secondary | ICD-10-CM

## 2019-07-22 DIAGNOSIS — R739 Hyperglycemia, unspecified: Secondary | ICD-10-CM

## 2019-07-22 DIAGNOSIS — I10 Essential (primary) hypertension: Secondary | ICD-10-CM | POA: Diagnosis not present

## 2019-07-23 LAB — COMPLETE METABOLIC PANEL WITH GFR
AG Ratio: 2.5 (calc) (ref 1.0–2.5)
ALT: 21 U/L (ref 9–46)
AST: 15 U/L (ref 10–35)
Albumin: 4 g/dL (ref 3.6–5.1)
Alkaline phosphatase (APISO): 90 U/L (ref 35–144)
BUN: 13 mg/dL (ref 7–25)
CO2: 27 mmol/L (ref 20–32)
Calcium: 9.3 mg/dL (ref 8.6–10.3)
Chloride: 107 mmol/L (ref 98–110)
Creat: 0.96 mg/dL (ref 0.70–1.18)
GFR, Est African American: 92 mL/min/{1.73_m2} (ref >=60)
GFR, Est Non African American: 80 mL/min/{1.73_m2} (ref >=60)
Globulin: 1.6 g/dL (calc) — ABNORMAL LOW (ref 1.9–3.7)
Glucose, Bld: 99 mg/dL (ref 65–99)
Potassium: 4 mmol/L (ref 3.5–5.3)
Sodium: 141 mmol/L (ref 135–146)
Total Bilirubin: 0.6 mg/dL (ref 0.2–1.2)
Total Protein: 5.6 g/dL — ABNORMAL LOW (ref 6.1–8.1)

## 2019-07-23 LAB — CBC WITH DIFFERENTIAL/PLATELET
Absolute Monocytes: 588 cells/uL (ref 200–950)
Basophils Absolute: 77 cells/uL (ref 0–200)
Basophils Relative: 1.1 %
Eosinophils Absolute: 350 cells/uL (ref 15–500)
Eosinophils Relative: 5 %
HCT: 47.1 % (ref 38.5–50.0)
Hemoglobin: 15.9 g/dL (ref 13.2–17.1)
Lymphs Abs: 1589 cells/uL (ref 850–3900)
MCH: 29.3 pg (ref 27.0–33.0)
MCHC: 33.8 g/dL (ref 32.0–36.0)
MCV: 86.9 fL (ref 80.0–100.0)
MPV: 11.1 fL (ref 7.5–12.5)
Monocytes Relative: 8.4 %
Neutro Abs: 4396 cells/uL (ref 1500–7800)
Neutrophils Relative %: 62.8 %
Platelets: 205 10*3/uL (ref 140–400)
RBC: 5.42 10*6/uL (ref 4.20–5.80)
RDW: 13.1 % (ref 11.0–15.0)
Total Lymphocyte: 22.7 %
WBC: 7 10*3/uL (ref 3.8–10.8)

## 2019-07-23 LAB — HEMOGLOBIN A1C
Hgb A1c MFr Bld: 5.3 % of total Hgb (ref <5.7)
Mean Plasma Glucose: 105 (calc)
eAG (mmol/L): 5.8 (calc)

## 2019-07-23 LAB — LIPID PANEL
Cholesterol: 177 mg/dL (ref <200)
HDL: 40 mg/dL (ref >=40)
LDL Cholesterol (Calc): 107 mg/dL (calc) — ABNORMAL HIGH
Non-HDL Cholesterol (Calc): 137 mg/dL (calc) — ABNORMAL HIGH (ref <130)
Total CHOL/HDL Ratio: 4.4 (calc) (ref <5.0)
Triglycerides: 182 mg/dL — ABNORMAL HIGH (ref <150)

## 2019-07-25 ENCOUNTER — Encounter: Payer: PPO | Admitting: Internal Medicine

## 2019-07-29 ENCOUNTER — Other Ambulatory Visit: Payer: Self-pay

## 2019-07-29 ENCOUNTER — Ambulatory Visit (INDEPENDENT_AMBULATORY_CARE_PROVIDER_SITE_OTHER): Payer: PPO | Admitting: Internal Medicine

## 2019-07-29 ENCOUNTER — Encounter: Payer: Self-pay | Admitting: Internal Medicine

## 2019-07-29 VITALS — BP 126/80 | HR 85 | Temp 97.3°F | Ht 70.0 in | Wt 278.2 lb

## 2019-07-29 DIAGNOSIS — R739 Hyperglycemia, unspecified: Secondary | ICD-10-CM | POA: Diagnosis not present

## 2019-07-29 DIAGNOSIS — I10 Essential (primary) hypertension: Secondary | ICD-10-CM

## 2019-07-29 DIAGNOSIS — Z0000 Encounter for general adult medical examination without abnormal findings: Secondary | ICD-10-CM

## 2019-07-29 DIAGNOSIS — N401 Enlarged prostate with lower urinary tract symptoms: Secondary | ICD-10-CM

## 2019-07-29 DIAGNOSIS — E7849 Other hyperlipidemia: Secondary | ICD-10-CM | POA: Diagnosis not present

## 2019-07-29 DIAGNOSIS — K222 Esophageal obstruction: Secondary | ICD-10-CM | POA: Diagnosis not present

## 2019-07-29 DIAGNOSIS — H40053 Ocular hypertension, bilateral: Secondary | ICD-10-CM

## 2019-07-29 DIAGNOSIS — R350 Frequency of micturition: Secondary | ICD-10-CM | POA: Diagnosis not present

## 2019-07-29 NOTE — Patient Instructions (Addendum)
Ask at front to change wellness to televisit if desired.  Consider stationary bike for regular exercise--you should be getting 150 minutes of aerobic exercise weekly.  Cut down on your fluids the last two-three hours before bed.  This should help with frequency at night.  If not, let us know.  We recommend tylenol use over ibuprofen use due to risks of bleeding and kidney complications and elevated blood pressure.  Please do follow-up with Dr. Cristina Gong about your swallowing.

## 2019-07-29 NOTE — Progress Notes (Signed)
Location:  [facility] clinic  Provider: Dr. Hollace Kinnier  Advanced Directives 07/29/2019  Does Patient Have a Medical Advance Directive? Yes  Type of Advance Directive Living will  Does patient want to make changes to medical advance directive? No - Patient declined  Copy of [place] in Chart? -  Would patient like information on creating a medical advance directive? -     Chief Complaint  Patient presents with  . Annual Exam    Physical exam, no concerns    HPI: Patient is a 68 y.o. male seen today for medical management of chronic diseases.    Labs reviewed with patient.   Still taking Nexium and Pepcid daily. Follows a gastroenterologist. Has not seen gastro in about a year. He needs his esophagus stretched, but still reluctant to have procedure done due to covid. Occasionally he will have trouble swallowing. This has progressed within the past few months.   Takes his blood pressure medicine daily. Avoids salty foods. Denies any headaches or dizziness.   Will exercise during warmer months. Right now he stays active through chores.   No falls or injuries.   Eye doctor at [facility] about a month ago. He goes about every three months.   Saw dentist November 2020.   Still having urinary issues. Drinks up until he goes to bed.   Interested in covid vaccine when it is available to him.         Past Medical History:  Diagnosis Date  . Benign neoplasm of colon   . Benign neoplasm of skin of upper limb, including shoulder   . Carpal tunnel syndrome   . Chest pain    a. normal cors by cath in 2015  . Diaphragmatic hernia without mention of obstruction or gangrene   . Dyslipidemia   . Elevated prostate specific antigen (PSA)   . GERD (gastroesophageal reflux disease)   . Herpes zoster without mention of complication   . History of hiatal hernia   . Hypertension   . Hypertrophy of prostate with urinary obstruction and other lower urinary tract symptoms  (LUTS)   . Impotence of organic origin   . Lumbago   . Obesity, unspecified   . Osteoarthrosis, unspecified whether generalized or localized, lower leg   . Other and unspecified hyperlipidemia   . Other malaise and fatigue   . Other seborrheic keratosis   . Special screening for malignant neoplasm of prostate   . Stenosis, cervical spine   . Unspecified tinnitus     Past Surgical History:  Procedure Laterality Date  . ANTERIOR CERVICAL DECOMP/DISCECTOMY FUSION N/A 02/19/2018   Procedure: ANTERIOR CERVICAL DECOMPRESSION/DISCECTOMY FUSION - CERVICAL FOUR-CERVICAL FIVE - CERVICAL FIVE-CERVICAL SIX;  Surgeon: Earnie Larsson, MD;  Location: [place];  Service: Neurosurgery;  Laterality: N/A;  . CARDIOVASCULAR STRESS TEST  06/15/2012   Non-diagnostic for ischemia. No lexiscan EKG changes.  . CHOLECYSTECTOMY  1992  . EYE SURGERY    . eyeband  2006   removal of right eyeband  . INGUINAL HERNIA REPAIR  1984   left  . INTRAOCULAR LENS REMOVAL  2008   right  . LEFT HEART CATHETERIZATION WITH CORONARY ANGIOGRAM N/A 09/05/2013   Procedure: LEFT HEART CATHETERIZATION WITH CORONARY ANGIOGRAM;  Surgeon: Troy Sine, MD;  Location: [facility] CATH LAB;  Service: Cardiovascular;  Laterality: N/A;  . LOWER VENOUS EXTREMITY DOPPLER  01/26/2011   No evidence of thrombus or thrombophlebitis.  Marland Kitchen RETINAL DETACHMENT SURGERY  2001   right  .  TOTAL HIP ARTHROPLASTY  2012   bilateral  . TOTAL KNEE ARTHROPLASTY  2011, 2012   bilateral  . TRANSTHORACIC ECHOCARDIOGRAM  10/05/2009   EF >55%, normal LV size, systolic, and diastolic function.    Allergies  Allergen Reactions  . Lipitor [Atorvastatin] Other (See Comments)    Leg pain/cramps; statin myopathy  . Red Yeast Rice [Cholestin] Other (See Comments)    Makes legs hurt, myopathy  . Statins Other (See Comments)    "muscle break down", statin myopathy  . Codeine Other (See Comments)    Headache  . Latex Rash    Outpatient Encounter Medications as of 07/29/2019   Medication Sig  . amLODipine (NORVASC) 10 MG tablet TAKE 1 TABLET BY MOUTH EVERY DAY IN THE MORNING  . aspirin EC 81 MG tablet Take 81 mg by mouth every morning.  . benazepril (LOTENSIN) 40 MG tablet TAKE 1 TABLET BY MOUTH EVERY DAY IN THE MORNING  . brimonidine (ALPHAGAN P) 0.1 % SOLN Place 1 drop into the right eye 2 (two) times daily.  . dorzolamide-timolol (COSOPT) 22.3-6.8 MG/ML ophthalmic solution Place 2 drops into the right eye 2 (two) times daily.   Marland Kitchen esomeprazole (NEXIUM) 40 MG capsule TAKE 1 CAPSULE BY MOUTH AT BEDTIME. FOR REFLUX  . ezetimibe (ZETIA) 10 MG tablet Take 1 tablet (10 mg total) by mouth daily.  . famotidine (PEPCID) 20 MG tablet One at bedtime to reduce stomach acid  . finasteride (PROSCAR) 5 MG tablet TAKE 1 TABLET BY MOUTH EVERY DAY IN THE MORNING  . furosemide (LASIX) 40 MG tablet Take 1 tablet by mouth for 2 days, then 1/2 tablet (20 mg) daily  . Polyvinyl Alcohol-Povidone (REFRESH OP) Place 1 drop into the right eye daily as needed (dry eyes/irritation).  . rosuvastatin (CRESTOR) 5 MG tablet TAKE 1 TABLET (5 MG TOTAL) BY MOUTH ONCE A WEEK.   No facility-administered encounter medications on file as of 07/29/2019.     Review of Systems:  Review of Systems  Constitutional: Negative for activity change, appetite change and fatigue.  HENT: Positive for trouble swallowing. Negative for dental problem and hearing loss.   Eyes: Negative for photophobia and visual disturbance.  Respiratory: Negative for cough, choking, shortness of breath and wheezing.   Cardiovascular: Negative for chest pain, palpitations and leg swelling.  Gastrointestinal: Negative for abdominal pain, constipation, diarrhea and nausea.  Endocrine: Negative for polydipsia, polyphagia and polyuria.  Genitourinary: Positive for frequency. Negative for dysuria and hematuria.  Musculoskeletal: Negative for arthralgias and joint swelling.  Skin: Negative.   Neurological: Negative for dizziness,  weakness and headaches.  Psychiatric/Behavioral: Negative for dysphoric mood and sleep disturbance. The patient is not nervous/anxious.     Health Maintenance  Topic Date Due  . TETANUS/TDAP  08/29/2021  . COLONOSCOPY  05/08/2022  . INFLUENZA VACCINE  Completed  . Hepatitis C Screening  Discontinued  . PNA vac Low Risk Adult  Discontinued    Physical Exam: Vitals:   07/29/19 1001  Weight: 278 lb 3.2 oz (126.2 kg)  Height: 5\' 10"  (1.778 m)   Body mass index is 39.92 kg/m. Physical Exam Vitals signs reviewed.  Constitutional:      Appearance: Normal appearance. He is normal weight.  HENT:     Head: Normocephalic.     Right Ear: Tympanic membrane normal. There is no impacted cerumen.     Left Ear: Tympanic membrane normal. There is no impacted cerumen.     Mouth/Throat:     Mouth: Mucous  membranes are moist.     Pharynx: No posterior oropharyngeal erythema.  Eyes:     General:        Right eye: No discharge.        Left eye: No discharge.     Extraocular Movements: Extraocular movements intact.     Pupils: Pupils are equal, round, and reactive to light.  Neck:     Thyroid: No thyroid mass, thyromegaly or thyroid tenderness.  Cardiovascular:     Rate and Rhythm: Normal rate and regular rhythm.     Pulses: Normal pulses.     Heart sounds: Normal heart sounds. No murmur.  Pulmonary:     Effort: Pulmonary effort is normal. No respiratory distress.     Breath sounds: Normal breath sounds. No wheezing.  Abdominal:     General: Bowel sounds are normal. There is distension.     Palpations: Abdomen is soft.  Musculoskeletal: Normal range of motion.     Right lower leg: No edema.     Left lower leg: No edema.  Skin:    General: Skin is warm and dry.     Capillary Refill: Capillary refill takes less than 2 seconds.  Neurological:     General: No focal deficit present.     Mental Status: He is alert. Mental status is at baseline. He is disoriented.  Psychiatric:         Mood and Affect: Mood normal.        Behavior: Behavior normal.        Thought Content: Thought content normal.        Judgment: Judgment normal.     Labs reviewed: Basic Metabolic Panel: Recent Labs    09/06/18 0811 10/22/18 0804 04/23/19 0818 07/22/19 0843  NA 141  --  142 141  K 4.1  --  3.9 4.0  CL 101  --  109 107  CO2 25  --  28 27  GLUCOSE 101*  --  111* 99  BUN 17  --  14 13  CREATININE 0.99  --  0.98 0.96  CALCIUM 9.6  --  9.1 9.3  TSH  --  3.66  --   --    Liver Function Tests: Recent Labs    07/22/19 0843  AST 15  ALT 21  BILITOT 0.6  PROT 5.6*   No results for input(s): LIPASE, AMYLASE in the last 8760 hours. No results for input(s): AMMONIA in the last 8760 hours. CBC: Recent Labs    07/22/19 0843  WBC 7.0  NEUTROABS 4,396  HGB 15.9  HCT 47.1  MCV 86.9  PLT 205   Lipid Panel: Recent Labs    10/22/18 0804 04/23/19 0818 07/22/19 0843  CHOL 173 180 177  HDL 46 42 40  LDLCALC 103* 113* 107*  TRIG 143 135 182*  CHOLHDL 3.8 4.3 4.4   Lab Results  Component Value Date   HGBA1C 5.3 07/22/2019    Procedures since last visit: No results found.  Assessment/Plan 1. Annual physical exam - MMSE 27  2. Morbid obesity ([place]) - he has maintained his current weight, but has not lost additional weight - recommend 150 minutes/week of exercise - recommend stationary bike during cold months to exercise - complete blood count with differential/platelets- future - complete metabolic panel with GFR- future  3. Other hyperlipidemia - triglycerides elevated, HDL borderline low - discussed dietary changes to help improve triglyceride level - recommend reducing alcohol, starchy foods and fats - continue to eat  foods rich in "good fats" like [place] and salmon - continue current Crestor dosage - lipid panel- future  4. Essential hypertension - bp at goal today, continue current medication regimen - continue low sodium diet - complete blood count  with differential/platelets- future  5. Hyperglycemia - hemoglobin A1C 5.3, unchanged from last labs - recommend interventions to continue weight loss like exercising and watching dietary habits - hemoglobin A1C- future - complete metabolic panel with GFR- future - microalbumin- future  6. Esophageal stenosis - patient states trouble swallowing has increased - followed by Dr. Cristina Gong - recommend making follow up with Dr. Cristina Gong to address swallowing issues  7. Bilateral ocular hypertension - stable at this time - followed by Dr. Yehuda Mao  8. Benign prostatic hyperplasia with urinary frequency - reports nocturia - recommend patient stop fluids 2 hours before bedtime - contact PCP if nocturia does not improve after cutting evening fluids intake - continue finasteride regimen - PSA- future    Labs/tests ordered:  Complete blood panel with differential/platelets, complete metabolic panel with GFR, microalbumin, hemoglobin A1C, lipid panel- future Next appt:  07/31/2019

## 2019-07-30 ENCOUNTER — Encounter: Payer: Self-pay | Admitting: Nurse Practitioner

## 2019-07-31 ENCOUNTER — Telehealth: Payer: Self-pay

## 2019-07-31 ENCOUNTER — Other Ambulatory Visit: Payer: Self-pay

## 2019-07-31 ENCOUNTER — Encounter: Payer: Self-pay | Admitting: Nurse Practitioner

## 2019-07-31 ENCOUNTER — Ambulatory Visit (INDEPENDENT_AMBULATORY_CARE_PROVIDER_SITE_OTHER): Payer: PPO | Admitting: Nurse Practitioner

## 2019-07-31 DIAGNOSIS — Z0000 Encounter for general adult medical examination without abnormal findings: Secondary | ICD-10-CM

## 2019-07-31 NOTE — Progress Notes (Signed)
Subjective:   Ihor Polka. is a 68 y.o. male who presents for Medicare Annual/Subsequent preventive examination.  Review of Systems:   Cardiac Risk Factors include: advanced age (>19men, >24 women);family history of premature cardiovascular disease;dyslipidemia;hypertension;male gender;obesity (BMI >30kg/m2)     Objective:    Vitals: There were no vitals taken for this visit.  There is no height or weight on file to calculate BMI.  Advanced Directives 07/31/2019 07/29/2019 04/30/2018 02/19/2018 11/29/2017 10/31/2017 05/04/2017  Does Patient Have a Medical Advance Directive? Yes Yes Yes No Yes No Yes  Type of Advance Directive Living will Living will Living will - Living will - [place];Living will  Does patient want to make changes to medical advance directive? No - Patient declined No - Patient declined No - Patient declined - No - Patient declined - -  Copy of [place] in Chart? - - - No - copy requested - - No - copy requested  Would patient like information on creating a medical advance directive? - - - - - Yes (MAU/Ambulatory/Procedural Areas - Information given) -    Tobacco Social History   Tobacco Use  Smoking Status Never Smoker  Smokeless Tobacco Never Used     Counseling given: Not Answered   Clinical Intake:  Pre-visit preparation completed: Yes  Pain : No/denies pain     BMI - recorded: 39.92 Nutritional Status: BMI > 30  Obese Nutritional Risks: None Diabetes: No  How often do you need to have someone help you when you read instructions, pamphlets, or other written materials from your doctor or pharmacy?: 1 - Never What is the last grade level you completed in school?: 12th  Interpreter Needed?: No     Past Medical History:  Diagnosis Date  . Benign neoplasm of colon   . Benign neoplasm of skin of upper limb, including shoulder   . Carpal tunnel syndrome   . Chest pain    a. normal cors by cath in 2015  .  Diaphragmatic hernia without mention of obstruction or gangrene   . Dyslipidemia   . Elevated prostate specific antigen (PSA)   . GERD (gastroesophageal reflux disease)   . Herpes zoster without mention of complication   . History of hiatal hernia   . Hypertension   . Hypertrophy of prostate with urinary obstruction and other lower urinary tract symptoms (LUTS)   . Impotence of organic origin   . Lumbago   . Obesity, unspecified   . Osteoarthrosis, unspecified whether generalized or localized, lower leg   . Other and unspecified hyperlipidemia   . Other malaise and fatigue   . Other seborrheic keratosis   . Special screening for malignant neoplasm of prostate   . Stenosis, cervical spine   . Unspecified tinnitus    Past Surgical History:  Procedure Laterality Date  . ANTERIOR CERVICAL DECOMP/DISCECTOMY FUSION N/A 02/19/2018   Procedure: ANTERIOR CERVICAL DECOMPRESSION/DISCECTOMY FUSION - CERVICAL FOUR-CERVICAL FIVE - CERVICAL FIVE-CERVICAL SIX;  Surgeon: Earnie Larsson, MD;  Location: [facility];  Service: Neurosurgery;  Laterality: N/A;  . CARDIOVASCULAR STRESS TEST  06/15/2012   Non-diagnostic for ischemia. No lexiscan EKG changes.  . CHOLECYSTECTOMY  1992  . EYE SURGERY    . eyeband  2006   removal of right eyeband  . INGUINAL HERNIA REPAIR  1984   left  . INTRAOCULAR LENS REMOVAL  2008   right  . LEFT HEART CATHETERIZATION WITH CORONARY ANGIOGRAM N/A 09/05/2013   Procedure: LEFT  HEART CATHETERIZATION WITH CORONARY ANGIOGRAM;  Surgeon: Troy Sine, MD;  Location: [facility] CATH LAB;  Service: Cardiovascular;  Laterality: N/A;  . LOWER VENOUS EXTREMITY DOPPLER  01/26/2011   No evidence of thrombus or thrombophlebitis.  Marland Kitchen RETINAL DETACHMENT SURGERY  2001   right  . TOTAL HIP ARTHROPLASTY  2012   bilateral  . TOTAL KNEE ARTHROPLASTY  2011, 2012   bilateral  . TRANSTHORACIC ECHOCARDIOGRAM  10/05/2009   EF >55%, normal LV size, systolic, and diastolic function.   Family History  Problem  Relation Age of Onset  . Diabetes Mother   . Arrhythmia Mother   . Hypertension Mother   . Dementia Father   . Pulmonary embolism Father   . Hypertension Father   . Cancer Sister        Liver cancer - Histocytic lymphoma  . Cancer Sister 56       Skin cancer  . Hypertension Sister 46  . Diabetes Sister   . Stroke Brother   . Diabetes Brother   . Hypertension Brother   . Cancer Daughter   . Cancer Maternal Grandmother        Skin cancer  . Heart attack Maternal Grandfather   . Cancer Paternal Grandfather   . Hypertension Paternal Grandfather    Social History   Socioeconomic History  . Marital status: Married    Spouse name: Not on file  . Number of children: Not on file  . Years of education: Not on file  . Highest education level: Not on file  Occupational History  . Occupation: retired Medical sales representative     Comment: Hospital doctor  Social Needs  . Financial resource strain: Not hard at all  . Food insecurity    Worry: Never true    Inability: Never true  . Transportation needs    Medical: No    Non-medical: No  Tobacco Use  . Smoking status: Never Smoker  . Smokeless tobacco: Never Used  Substance and Sexual Activity  . Alcohol use: No  . Drug use: No  . Sexual activity: Yes    Partners: Female    Comment: wife  Lifestyle  . Physical activity    Days per week: 2 days    Minutes per session: 60 min  . Stress: Only a little  Relationships  . Social Herbalist on phone: Twice a week    Gets together: Twice a week    Attends religious service: More than 4 times per year    Active member of club or organization: No    Attends meetings of clubs or organizations: Never    Relationship status: Married  Other Topics Concern  . Not on file  Social History Narrative   Married   Never smoked   Alcohol none   Exercise -gym 3 days a week (join 3 months ago)   Living Will    Outpatient Encounter Medications as of 07/31/2019  Medication Sig  .  amLODipine (NORVASC) 10 MG tablet TAKE 1 TABLET BY MOUTH EVERY DAY IN THE MORNING  . aspirin EC 81 MG tablet Take 81 mg by mouth every morning.  . benazepril (LOTENSIN) 40 MG tablet TAKE 1 TABLET BY MOUTH EVERY DAY IN THE MORNING  . brimonidine (ALPHAGAN P) 0.1 % SOLN Place 1 drop into the right eye 2 (two) times daily.  . dorzolamide-timolol (COSOPT) 22.3-6.8 MG/ML ophthalmic solution Place 2 drops into the right eye 2 (two) times daily.   Marland Kitchen  esomeprazole (NEXIUM) 40 MG capsule TAKE 1 CAPSULE BY MOUTH AT BEDTIME. FOR REFLUX  . ezetimibe (ZETIA) 10 MG tablet Take 1 tablet (10 mg total) by mouth daily.  . famotidine (PEPCID) 20 MG tablet One at bedtime to reduce stomach acid  . finasteride (PROSCAR) 5 MG tablet TAKE 1 TABLET BY MOUTH EVERY DAY IN THE MORNING  . furosemide (LASIX) 40 MG tablet Take 1 tablet by mouth for 2 days, then 1/2 tablet (20 mg) daily  . Polyvinyl Alcohol-Povidone (REFRESH OP) Place 1 drop into the right eye daily as needed (dry eyes/irritation).  . rosuvastatin (CRESTOR) 5 MG tablet TAKE 1 TABLET (5 MG TOTAL) BY MOUTH ONCE A WEEK.   No facility-administered encounter medications on file as of 07/31/2019.     Activities of Daily Living In your present state of health, do you have any difficulty performing the following activities: 07/31/2019  Hearing? N  Vision? N  Difficulty concentrating or making decisions? N  Walking or climbing stairs? N  Dressing or bathing? N  Doing errands, shopping? N  Preparing Food and eating ? N  Using the Toilet? N  In the past six months, have you accidently leaked urine? N  Do you have problems with loss of bowel control? N  Managing your Medications? N  Managing your Finances? N  Housekeeping or managing your Housekeeping? N  Some recent data might be hidden    Patient Care Team: Gayland Curry, DO as PCP - General (Geriatric Medicine) Troy Sine, MD as PCP - Cardiology (Cardiology)   Assessment:   This is a routine  wellness examination for [place].  Exercise Activities and Dietary recommendations Current Exercise Habits: Home exercise routine, Type of exercise: walking, Time (Minutes): 30, Frequency (Times/Week): 3, Weekly Exercise (Minutes/Week): 90, Intensity: Mild  Goals    . Exercise 3x per week (30 min per time)     Starting 08/05/16, I will attempt to exercise 3 x per week.     . Weight (lb) < 250 lb (113.4 kg)     Weight loss through diet mostly       Fall Risk Fall Risk  07/31/2019 07/29/2019 07/29/2019 04/25/2019 01/21/2019  Falls in the past year? 0 0 0 0 0  Number falls in past yr: 0 - - 0 0  Injury with Fall? 0 - - 0 0   Is the patient's home free of loose throw rugs in walkways, pet beds, electrical cords, etc?   yes      Grab bars in the bathroom? no      Handrails on the stairs?   yes      Adequate lighting?   yes  Timed Get Up and Go Performed: na  Depression Screen PHQ 2/9 Scores 07/31/2019 07/29/2019 07/29/2019 04/25/2019  PHQ - 2 Score 0 0 0 0    Cognitive Function MMSE - Mini Mental State Exam 07/29/2019 10/31/2017 08/05/2016  Orientation to time 3 4 4   Orientation to Place 5 5 5   Registration 3 3 3   Attention/ Calculation 4 5 3   Recall 3 1 3   Language- name 2 objects 2 2 2   Language- repeat 1 1 1   Language- follow 3 step command 3 3 3   Language- read & follow direction 1 1 1   Write a sentence 1 1 1   Copy design 1 1 1   Total score 27 27 27         Immunization History  Administered Date(s) Administered  . Fluad Quad(high Dose 65+)  04/25/2019  . Influenza, High Dose Seasonal PF 05/04/2017, 06/21/2018  . Influenza,inj,Quad PF,6+ Mos 06/11/2013, 05/20/2015, 08/03/2016  . Influenza-Unspecified 07/05/2018  . Pneumococcal Conjugate-13 09/23/2014  . Pneumococcal Polysaccharide-23 06/27/2003, 10/31/2017  . Tdap 08/30/2011    Qualifies for Shingles Vaccine? yes  Screening Tests Health Maintenance  Topic Date Due  . TETANUS/TDAP  08/29/2021  . COLONOSCOPY  05/08/2022   . INFLUENZA VACCINE  Completed  . Hepatitis C Screening  Discontinued  . PNA vac Low Risk Adult  Discontinued   Cancer Screenings: Lung: Low Dose CT Chest recommended if Age 32-80 years, 30 pack-year currently smoking OR have quit w/in 15years. Patient does not qualify. Colorectal: up to date  Additional Screenings:  Hepatitis C Screening:declines      Plan:     I have personally reviewed and noted the following in the patient's chart:   . Medical and social history . Use of alcohol, tobacco or illicit drugs  . Current medications and supplements . Functional ability and status . Nutritional status . Physical activity . Advanced directives . List of other physicians . Hospitalizations, surgeries, and ER visits in previous 12 months . Vitals . Screenings to include cognitive, depression, and falls . Referrals and appointments  In addition, I have reviewed and discussed with patient certain preventive protocols, quality metrics, and best practice recommendations. A written personalized care plan for preventive services as well as general preventive health recommendations were provided to patient.     Lauree Chandler, NP  07/31/2019

## 2019-07-31 NOTE — Telephone Encounter (Signed)
Patient decided to complete visit despite being on the road

## 2019-07-31 NOTE — Progress Notes (Signed)
Patient ID: Anthony Suppes., male   DOB: 24-Oct-1950, 68 y.o.   MRN: [id number]    This service is provided via telemedicine  No vital signs collected/recorded due to the encounter was a telemedicine visit.   Location of patient (ex: home, work[place]  Patient consents to a telephone visit:  Yes  Location of the provider (ex: office, home):  [facility], Office   Name of any referring provider:  Gayland Curry, DO   Names of all persons participating in the telemedicine service and their role in the encounter:  S.Chrae B/CMA, Sherrie Mustache, NP, and Patient   Time spent on call:  5 min with medical assistant

## 2019-07-31 NOTE — Patient Instructions (Addendum)
Anthony [name] , Thank you for taking time to come for your Medicare Wellness Visit. I appreciate your ongoing commitment to your health goals. Please review the following plan we discussed and let me know if I can assist you in the future.   Screening recommendations/referrals: Colonoscopy up to date Recommended yearly ophthalmology/optometry visit for glaucoma screening and checkup Recommended yearly dental visit for hygiene and checkup  Vaccinations: Influenza vaccine up to date Pneumococcal vaccine up to date Tdap vaccine up to date Shingles vaccine DUE- to get at your local pharmacy    Advanced directives: recommended to complete advance directive, healthcare power of attorney   Conditions/risks identified: obesity, at risk for heart disease and complications related to weight.   Next appointment: 1 year  Preventive Care 70 Years and Older, Male Preventive care refers to lifestyle choices and visits with your health care provider that can promote health and wellness. What does preventive care include?  A yearly physical exam. This is also called an annual well check.  Dental exams once or twice a year.  Routine eye exams. Ask your health care provider how often you should have your eyes checked.  Personal lifestyle choices, including:  Daily care of your teeth and gums.  Regular physical activity.  Eating a healthy diet.  Avoiding tobacco and drug use.  Limiting alcohol use.  Practicing safe sex.  Taking low doses of aspirin every day.  Taking vitamin and mineral supplements as recommended by your health care provider. What happens during an annual well check? The services and screenings done by your health care provider during your annual well check will depend on your age, overall health, lifestyle risk factors, and family history of disease. Counseling  Your health care provider may ask you questions about your:  Alcohol use.  Tobacco use.  Drug use.   Emotional well-being.  Home and relationship well-being.  Sexual activity.  Eating habits.  History of falls.  Memory and ability to understand (cognition).  Work and work Statistician. Screening  You may have the following tests or measurements:  Height, weight, and BMI.  Blood pressure.  Lipid and cholesterol levels. These may be checked every 5 years, or more frequently if you are over 27 years old.  Skin check.  Lung cancer screening. You may have this screening every year starting at age 72 if you have a 30-pack-year history of smoking and currently smoke or have quit within the past 15 years.  Fecal occult blood test (FOBT) of the stool. You may have this test every year starting at age 52.  Flexible sigmoidoscopy or colonoscopy. You may have a sigmoidoscopy every 5 years or a colonoscopy every 10 years starting at age 44.  Prostate cancer screening. Recommendations will vary depending on your family history and other risks.  Hepatitis C blood test.  Hepatitis B blood test.  Sexually transmitted disease (STD) testing.  Diabetes screening. This is done by checking your blood sugar (glucose) after you have not eaten for a while (fasting). You may have this done every 1-3 years.  Abdominal aortic aneurysm (AAA) screening. You may need this if you are a current or former smoker.  Osteoporosis. You may be screened starting at age 81 if you are at high risk. Talk with your health care provider about your test results, treatment options, and if necessary, the need for more tests. Vaccines  Your health care provider may recommend certain vaccines, such as:  Influenza vaccine. This is recommended every year.  Tetanus, diphtheria, and acellular pertussis (Tdap, Td) vaccine. You may need a Td booster every 10 years.  Zoster vaccine. You may need this after age 35.  Pneumococcal 13-valent conjugate (PCV13) vaccine. One dose is recommended after age 1.  Pneumococcal  polysaccharide (PPSV23) vaccine. One dose is recommended after age 57. Talk to your health care provider about which screenings and vaccines you need and how often you need them. This information is not intended to replace advice given to you by your health care provider. Make sure you discuss any questions you have with your health care provider. Document Released: 09/04/2015 Document Revised: 04/27/2016 Document Reviewed: 06/09/2015 Elsevier Interactive Patient Education  2017 [place] Prevention in the Home Falls can cause injuries. They can happen to people of all ages. There are many things you can do to make your home safe and to help prevent falls. What can I do on the outside of my home?  Regularly fix the edges of walkways and driveways and fix any cracks.  Remove anything that might make you trip as you walk through a door, such as a raised step or threshold.  Trim any bushes or trees on the path to your home.  Use bright outdoor lighting.  Clear any walking paths of anything that might make someone trip, such as rocks or tools.  Regularly check to see if handrails are loose or broken. Make sure that both sides of any steps have handrails.  Any raised decks and porches should have guardrails on the edges.  Have any leaves, snow, or ice cleared regularly.  Use sand or salt on walking paths during winter.  Clean up any spills in your garage right away. This includes oil or grease spills. What can I do in the bathroom?  Use night lights.  Install grab bars by the toilet and in the tub and shower. Do not use towel bars as grab bars.  Use non-skid mats or decals in the tub or shower.  If you need to sit down in the shower, use a plastic, non-slip stool.  Keep the floor dry. Clean up any water that spills on the floor as soon as it happens.  Remove soap buildup in the tub or shower regularly.  Attach bath mats securely with double-sided non-slip rug tape.   Do not have throw rugs and other things on the floor that can make you trip. What can I do in the bedroom?  Use night lights.  Make sure that you have a light by your bed that is easy to reach.  Do not use any sheets or blankets that are too big for your bed. They should not hang down onto the floor.  Have a firm chair that has side arms. You can use this for support while you get dressed.  Do not have throw rugs and other things on the floor that can make you trip. What can I do in the kitchen?  Clean up any spills right away.  Avoid walking on wet floors.  Keep items that you use a lot in easy-to-reach places.  If you need to reach something above you, use a strong step stool that has a grab bar.  Keep electrical cords out of the way.  Do not use floor polish or wax that makes floors slippery. If you must use wax, use non-skid floor wax.  Do not have throw rugs and other things on the floor that can make you trip. What can I do  with my stairs?  Do not leave any items on the stairs.  Make sure that there are handrails on both sides of the stairs and use them. Fix handrails that are broken or loose. Make sure that handrails are as long as the stairways.  Check any carpeting to make sure that it is firmly attached to the stairs. Fix any carpet that is loose or worn.  Avoid having throw rugs at the top or bottom of the stairs. If you do have throw rugs, attach them to the floor with carpet tape.  Make sure that you have a light switch at the top of the stairs and the bottom of the stairs. If you do not have them, ask someone to add them for you. What else can I do to help prevent falls?  Wear shoes that:  Do not have high heels.  Have rubber bottoms.  Are comfortable and fit you well.  Are closed at the toe. Do not wear sandals.  If you use a stepladder:  Make sure that it is fully opened. Do not climb a closed stepladder.  Make sure that both sides of the  stepladder are locked into place.  Ask someone to hold it for you, if possible.  Clearly mark and make sure that you can see:  Any grab bars or handrails.  First and last steps.  Where the edge of each step is.  Use tools that help you move around (mobility aids) if they are needed. These include:  Canes.  Walkers.  Scooters.  Crutches.  Turn on the lights when you go into a dark area. Replace any light bulbs as soon as they burn out.  Set up your furniture so you have a clear path. Avoid moving your furniture around.  If any of your floors are uneven, fix them.  If there are any pets around you, be aware of where they are.  Review your medicines with your doctor. Some medicines can make you feel dizzy. This can increase your chance of falling. Ask your doctor what other things that you can do to help prevent falls. This information is not intended to replace advice given to you by your health care provider. Make sure you discuss any questions you have with your health care provider. Document Released: 06/04/2009 Document Revised: 01/14/2016 Document Reviewed: 09/12/2014 Elsevier Interactive Patient Education  2017 [employer].

## 2019-07-31 NOTE — Telephone Encounter (Signed)
At 8:23 am 1 attempt made to contact patient for AWV appointment scheduled for today with Sherrie Mustache, NP  2nd attempt to reach patient made at 8:33 am, no answer. I will make third and final attempt within the next hour

## 2019-07-31 NOTE — Telephone Encounter (Signed)
3rd attempt made to reach patient with no success. I will try one final attempt to reach cell phone.   I called patient on cell phone and he indicated he sent a mychart message yesterday requesting to cancel appointment for he is on the road. Patient agreed to complete visit despite being on the road.

## 2019-08-06 ENCOUNTER — Other Ambulatory Visit: Payer: Self-pay | Admitting: Cardiovascular Disease

## 2019-08-06 DIAGNOSIS — I10 Essential (primary) hypertension: Secondary | ICD-10-CM

## 2019-08-06 NOTE — Telephone Encounter (Signed)
Rx request sent to pharmacy.  

## 2019-08-10 ENCOUNTER — Other Ambulatory Visit: Payer: Self-pay | Admitting: Internal Medicine

## 2019-08-10 DIAGNOSIS — E7849 Other hyperlipidemia: Secondary | ICD-10-CM

## 2019-08-11 ENCOUNTER — Other Ambulatory Visit: Payer: Self-pay | Admitting: Internal Medicine

## 2019-09-03 ENCOUNTER — Encounter: Payer: Self-pay | Admitting: Cardiovascular Disease

## 2019-09-03 ENCOUNTER — Other Ambulatory Visit: Payer: Self-pay

## 2019-09-03 ENCOUNTER — Ambulatory Visit: Payer: PPO | Admitting: Cardiovascular Disease

## 2019-09-03 VITALS — BP 134/80 | HR 81 | Temp 98.2°F | Ht 70.0 in | Wt 282.0 lb

## 2019-09-03 DIAGNOSIS — I519 Heart disease, unspecified: Secondary | ICD-10-CM

## 2019-09-03 DIAGNOSIS — R600 Localized edema: Secondary | ICD-10-CM

## 2019-09-03 DIAGNOSIS — R0609 Other forms of dyspnea: Secondary | ICD-10-CM

## 2019-09-03 DIAGNOSIS — E785 Hyperlipidemia, unspecified: Secondary | ICD-10-CM | POA: Diagnosis not present

## 2019-09-03 DIAGNOSIS — I10 Essential (primary) hypertension: Secondary | ICD-10-CM | POA: Diagnosis not present

## 2019-09-03 DIAGNOSIS — I2510 Atherosclerotic heart disease of native coronary artery without angina pectoris: Secondary | ICD-10-CM

## 2019-09-03 DIAGNOSIS — I5189 Other ill-defined heart diseases: Secondary | ICD-10-CM

## 2019-09-03 DIAGNOSIS — R0600 Dyspnea, unspecified: Secondary | ICD-10-CM | POA: Diagnosis not present

## 2019-09-03 MED ORDER — SPIRONOLACTONE 25 MG PO TABS: ORAL_TABLET | ORAL | 3 refills | Status: DC

## 2019-09-03 NOTE — Patient Instructions (Signed)
Medication Instructions:  Take 12.5mg  (half a tablet) daily for 2 weeks. If pressures stay in the [id number] systolic and swelling does not decrease after 1 week, increase dose to 25mg  daily.   If you need a refill on your cardiac medications before your next appointment, please call your pharmacy.   Lab work: BMET in 2 weeks If you have labs (blood work) drawn today and your tests are completely normal, you will receive your results only by: [place] (if you have MyChart) OR A paper copy in the mail If you have any lab test that is abnormal or we need to change your treatment, we will call you to review the results.  Testing/Procedures: NONE  Follow-Up: At [facility], you and your health needs are our priority.  As part of our continuing mission to provide you with exceptional heart care, we have created designated Provider Care Teams.  These Care Teams include your primary Cardiologist (physician) and Advanced Practice Providers (APPs -  Physician Assistants and Nurse Practitioners) who all work together to provide you with the care you need, when you need it. You may see Shelva Majestic, MD or one of the following Advanced Practice Providers on your designated Care Team:    Almyra Deforest, PA-C  Fabian Sharp, [place] or   Roby Lofts, [place]  Your physician wants you to follow-up in: 1 month with Dr. Claiborne Billings

## 2019-09-03 NOTE — Progress Notes (Signed)
Patient ID: Maude Gloor., male   DOB: 10-Jan-1951, 69 y.o.   MRN: [id number]     HPI: Regina Coppolino. is a 69 y.o. male who presents to the office for a 6   month  follow-up cardiology evaluation.   Mr. Hennes has a history of hypertension, mild obesity, as well as hyperlipidemia. In the past, he did develop CPK elevation secondary to statin therapy. He has been able to tolerate Zetia. A nuclear perfusion study done in October 2013 for chest pain showed normal perfusion without scar or ischemia.   Laboratory in October 2014 showed cholesterol 196, triglycerides significantly elevated at 291, HDL of 34, VLDL increased at 58, and  LDL 106. Renal function was normal. BUN 15 kerning 0.79.   He developed recurrent episodes of chest pain for which he saw [name] and because of exertionally precipitated episodes of discomfort definitive cardiac catheterization was recommended. This was done by me on 09/05/2013 and revealed normal coronary arteries with normal LV function without focal segmental wall motion abnormalities. Ejection fraction was 55%.  He underwent an echo Doppler study on 09/04/2013 which showed an ejection fraction of 55-60%. He did have grade 1 diastolic dysfunction and mild left ventricular hypertrophy. He had mild mitral annular calcification with mildly thickened leaflets and trivial mitral regurgitation. A moderate pressure estimate was 27 mm.  He was  seen by [name] on 08/17/2016 with complaints of chest discomfort.  He's episodes would occur often when he was walking on his driveway her in the grocery store and had been ongoing for 1-2 months.  He had diffuse tenderness to palpation along his left pectoral region.  On exam it was felt that some of his chest pain was musculoskeletal in etiology.  He was hypertensive and she further titrated amlodipine.  Because of exertional dyspnea.  She referred him for an echo Doppler study which was done on 08/23/2016.  This showed  mild LVH with normal systolic function and grade 1 diastolic dysfunction.  There is very mild increased peak PA pressure 34 mm.  I saw him in January 2019 after not having seen him since February 2015.  At that time, his pressure was elevated despite taking amlodipine 7.5 mg.  I recommended further titration to 10 mg daily.  On this increased dose, he states his blood pressure at home typically has been running in the 130-135 range.  He denies chest pain PND orthopnea.  He has a history of GERD which is controlled with Nexium.  He has had some issues with left arm paresthesias and was diagnosed with significant cervical degenerative disc disease.  He is scheduled to undergo neck surgery on December 12, 2017 by Dr. Trenton Gammon involving C4-5 and C5-6.  Preoperative surgical clearance was recommended.  He denies any chest pain or shortness of breath.  He has not been exercising as he had in the past in the winter months but hopes to do so following his surgery and with improved weather.    When I saw him in April 2019 he was given surgical clearance for his cervical surgery.  He underwent successful surgery by Dr. Trenton Gammon in July 2019 involving C4-5 and C5-6 and tolerated this well.   I last saw him in January 2020 at that time he had begun to notice some episodes of chest wall discomfort.  He denied any clear-cut exertional precipitation and noted the chest discomfort intermittently.  He was unable to walk well secondary to bilateral knee and  hip replacement surgery.  He also had experience of mild exertional shortness of breath.  I felt most likely that his chest pain was of musculoskeletal etiology.  However with his exertional dyspnea I recommended reevaluation of his coronary anatomy and suggested CT coronary angiography and also a 2-year follow-up echo Doppler assessment.  His echo study was done on August 30, 2018 and showed an EF of 60 to 65% without wall motion abnormalities.  There was grade 2 diastolic  dysfunction.  There was mild increased PA pressure at 35 mm.  There was mild MR.  Coronary CTA yielded calcium score of 26.4 which was 20th percentile for age and sex matched control.  He had normal coronary origin with right dominance.  There was minimal plaque in the ostial LAD and OM1.  CT demonstrated atherosclerotic disease in the descending thoracic aorta.  Last saw him on March 04, 2019 at which time he denied any chest pain, palpitations or dyspnea.  He was experiencing bilateral leg swelling despite taking HCTZ.   During this COVID pandemic, he has gained approximately 15 pounds.  He has not been exercising due to hip discomfort.  Laboratory in March 2020 showed a total cholesterol 173, triglycerides 143 HDL 46 and LDL 103.  During that evaluation I recommended he discontinue hydrochlorothiazide and in its place start furosemide 40 mg for 2 days and then 20 mg daily.  Over the last 6 months, he has been on amlodipine 10 mg, benazepril 40 mg, furosemide 20 mg daily.  At times he does experience shortness of breath and notes swelling right leg greater than left.  He has been able to take Zetia 10 mg in addition to very low-dose rosuvastatin 5 mg once a week and is tolerating this well.  Recently he has noticed slight increase in the shortness of breath.  He admits to further weight gain.  Laboratory 1 month ago showed total cholesterol 177, HDL 40 triglycerides 182 LDL 107.  BUN 13 creatinine 0.96.  He presents for reevaluation.  Past Medical History:  Diagnosis Date   Benign neoplasm of colon    Benign neoplasm of skin of upper limb, including shoulder    Carpal tunnel syndrome    Chest pain    a. normal cors by cath in 2015   Diaphragmatic hernia without mention of obstruction or gangrene    Dyslipidemia    Elevated prostate specific antigen (PSA)    GERD (gastroesophageal reflux disease)    Herpes zoster without mention of complication    History of hiatal hernia     Hypertension    Hypertrophy of prostate with urinary obstruction and other lower urinary tract symptoms (LUTS)    Impotence of organic origin    Lumbago    Obesity, unspecified    Osteoarthrosis, unspecified whether generalized or localized, lower leg    Other and unspecified hyperlipidemia    Other malaise and fatigue    Other seborrheic keratosis    Special screening for malignant neoplasm of prostate    Stenosis, cervical spine    Unspecified tinnitus     Past Surgical History:  Procedure Laterality Date   ANTERIOR CERVICAL DECOMP/DISCECTOMY FUSION N/A 02/19/2018   Procedure: ANTERIOR CERVICAL DECOMPRESSION/DISCECTOMY FUSION - CERVICAL FOUR-CERVICAL FIVE - CERVICAL FIVE-CERVICAL SIX;  Surgeon: Earnie Larsson, MD;  Location: [place];  Service: Neurosurgery;  Laterality: N/A;   CARDIOVASCULAR STRESS TEST  06/15/2012   Non-diagnostic for ischemia. No lexiscan EKG changes.   CHOLECYSTECTOMY  1992   EYE SURGERY  eyeband  2006   removal of right eyeband   INGUINAL HERNIA REPAIR  1984   left   INTRAOCULAR LENS REMOVAL  2008   right   LEFT HEART CATHETERIZATION WITH CORONARY ANGIOGRAM N/A 09/05/2013   Procedure: LEFT HEART CATHETERIZATION WITH CORONARY ANGIOGRAM;  Surgeon: Troy Sine, MD;  Location: [facility] CATH LAB;  Service: Cardiovascular;  Laterality: N/A;   LOWER VENOUS EXTREMITY DOPPLER  01/26/2011   No evidence of thrombus or thrombophlebitis.   RETINAL DETACHMENT SURGERY  2001   right   TOTAL HIP ARTHROPLASTY  2012   bilateral   TOTAL KNEE ARTHROPLASTY  2011, 2012   bilateral   TRANSTHORACIC ECHOCARDIOGRAM  10/05/2009   EF >55%, normal LV size, systolic, and diastolic function.    Allergies  Allergen Reactions   Lipitor [Atorvastatin] Other (See Comments)    Leg pain/cramps; statin myopathy   Red Yeast Rice [Cholestin] Other (See Comments)    Makes legs hurt, myopathy   Statins Other (See Comments)    "muscle break down", statin myopathy    Codeine Other (See Comments)    Headache   Latex Rash    Current Outpatient Medications  Medication Sig Dispense Refill   amLODipine (NORVASC) 10 MG tablet TAKE 1 TABLET BY MOUTH EVERY DAY IN THE MORNING 90 tablet 1   aspirin EC 81 MG tablet Take 81 mg by mouth every morning.     benazepril (LOTENSIN) 40 MG tablet TAKE 1 TABLET BY MOUTH EVERY DAY IN THE MORNING 90 tablet 1   brimonidine (ALPHAGAN P) 0.1 % SOLN Place 1 drop into the right eye 2 (two) times daily.     dorzolamide-timolol (COSOPT) 22.3-6.8 MG/ML ophthalmic solution Place 2 drops into the right eye 2 (two) times daily.      esomeprazole (NEXIUM) 40 MG capsule TAKE 1 CAPSULE BY MOUTH AT BEDTIME. FOR REFLUX 90 capsule 1   ezetimibe (ZETIA) 10 MG tablet TAKE 1 TABLET BY MOUTH EVERY DAY 90 tablet 1   finasteride (PROSCAR) 5 MG tablet TAKE 1 TABLET BY MOUTH EVERY DAY IN THE MORNING 90 tablet 1   furosemide (LASIX) 40 MG tablet Take 1 tablet by mouth for 2 days, then 1/2 tablet (20 mg) daily 90 tablet 3   Polyvinyl Alcohol-Povidone (REFRESH OP) Place 1 drop into the right eye daily as needed (dry eyes/irritation).     rosuvastatin (CRESTOR) 5 MG tablet TAKE 1 TABLET (5 MG TOTAL) BY MOUTH ONCE A WEEK. 90 tablet 3   spironolactone (ALDACTONE) 25 MG tablet Take 12.'5mg'$  daily for 1 week while monitoring pressures and swelling. If pressures continue to stay in the range of 329J-188C systolic and swelling does not decrease, take '25mg'$  daily. 90 tablet 3   No current facility-administered medications for this visit.    Social History   Socioeconomic History   Marital status: Married    Spouse name: Not on file   Number of children: Not on file   Years of education: Not on file   Highest education level: Not on file  Occupational History   Occupation: retired Medical sales representative     Comment: Hospital doctor  Tobacco Use   Smoking status: Never Smoker   Smokeless tobacco: Never Used  Substance and Sexual Activity    Alcohol use: No   Drug use: No   Sexual activity: Yes    Partners: Female    Comment: wife  Other Topics Concern   Not on file  Social History Narrative   Married  Never smoked   Alcohol none   Exercise -gym 3 days a week (join 3 months ago)   Living Will   Social Determinants of Health   Financial Resource Strain:    Difficulty of Paying Living Expenses: Not on file  Food Insecurity:    Worried About Charity fundraiser in the Last Year: Not on file   [employer] of Food in the Last Year: Not on file  Transportation Needs:    Lack of Transportation (Medical): Not on file   Lack of Transportation (Non-Medical): Not on file  Physical Activity:    Days of Exercise per Week: Not on file   Minutes of Exercise per Session: Not on file  Stress:    Feeling of Stress : Not on file  Social Connections:    Frequency of Communication with Friends and Family: Not on file   Frequency of Social Gatherings with Friends and Family: Not on file   Attends Religious Services: Not on file   Active Member of Clubs or Organizations: Not on file   Attends Archivist Meetings: Not on file   Marital Status: Not on file  Intimate Partner Violence:    Fear of Current or Ex-Partner: Not on file   Emotionally Abused: Not on file   Physically Abused: Not on file   Sexually Abused: Not on file   Social is notable that he is married has 2 children and 2 grandchildren. He is not routinely exercise. There is no tobacco use. He does drink occasional alcohol.  Family History  Problem Relation Age of Onset   Diabetes Mother    Arrhythmia Mother    Hypertension Mother    Dementia Father    Pulmonary embolism Father    Hypertension Father    Cancer Sister        Liver cancer - Histocytic lymphoma   Cancer Sister 44       Skin cancer   Hypertension Sister 46   Diabetes Sister    Stroke Brother    Diabetes Brother    Hypertension Brother    Cancer  Daughter    Cancer Maternal Grandmother        Skin cancer   Heart attack Maternal Grandfather    Cancer Paternal Grandfather    Hypertension Paternal Grandfather     ROS General: Negative; No fevers, chills, or night sweats;  positive for a 16 pound weight loss in September 2018 HEENT: Negative; No changes in vision or hearing, sinus congestion, difficulty swallowing Pulmonary: Negative; No cough, wheezing, shortness of breath, hemoptysis Cardiovascular: Positive for occasional exertional shortness of breath.  No exertional chest pain.  Positive for right leg greater than left swelling GI: Positive for GERD  GU: Negative; No dysuria, hematuria, or difficulty voiding Musculoskeletal: Lateral hip and knee discomfort; status post recent surgery C4-5/C5-6 Hematologic/Oncology: Negative; no easy bruising, bleeding Endocrine: Negative; no heat/cold intolerance; no diabetes Neuro: Mild left arm paresthesias from his cervical degenerative disease Skin: Negative; No rashes or skin lesions Psychiatric: Negative; No behavioral problems, depression Sleep: Negative; No snoring, daytime sleepiness, hypersomnolence, bruxism, restless legs, hypnogognic hallucinations, no cataplexy Other comprehensive 14 point system review is negative.  PE BP 134/80 (BP Location: Left Arm, Patient Position: Sitting, Cuff Size: Large)    Pulse 81    Temp 98.2 F (36.8 C)    Ht _0  (1.778 m)    Wt 282 lb (127.9 kg)    BMI 40.46 kg/m    Repeat blood  pressure by me was 144/82  Wt Readings from Last 3 Encounters:  09/03/19 282 lb (127.9 kg)  07/29/19 278 lb 3.2 oz (126.2 kg)  04/25/19 279 lb (126.6 kg)   General: Alert, oriented, no distress.  Skin: normal turgor, no rashes, warm and dry HEENT: Normocephalic, atraumatic. Pupils equal round and reactive to light; sclera anicteric; extraocular muscles intact;  Nose without nasal septal hypertrophy Mouth/Parynx benign; Mallinpatti scale 3 Neck: No JVD, no  carotid bruits; normal carotid upstroke Lungs: clear to ausculatation and percussion; no wheezing or rales Chest wall: without tenderness to palpitation Heart: PMI not displaced, RRR, s1 s2 normal, 1/6 systolic murmur, no diastolic murmur, no rubs, gallops, thrills, or heaves Abdomen: soft, nontender; no hepatosplenomehaly, BS+; abdominal aorta nontender and not dilated by palpation. Back: no CVA tenderness Pulses 2+ Musculoskeletal: full range of motion, normal strength, no joint deformities Extremities: Mild edema right lower extremity no clubbing cyanosis , Homan's sign negative  Neurologic: grossly nonfocal; Cranial nerves grossly wnl Psychologic: Normal mood and affect    ECG (independently read by me): Normal sinus rhythm at 81 bpm.  No ectopy.  Normal intervals.  July 2020 ECG (independently read by me): NSR at 70  January 2020 ECG (independently read by me): Normal sinus rhythm at 74 bpm.  No ectopy.  No ST segment changes.  April 2019 ECG (independently read by me): Normal sinus rhythm at 66 bpm.  Normal intervals.  No ectopy.  No ST segment changes.  August 28, 2017 ECG (independently read by me): Normal sinus rhythm at 63 bpm.  Normal intervals.  No ST segment changes.  February 2015 ECG (independently read by me): Normal sinus rhythm at 66 beats per minute. PR interval 160 ms. QTC intervals 34 ms. No significant ST-T changes.  Prior ECG from November 2014 :Normal sinus rhythm at 69 beats per minute. No ectopy. Normal intervals.  LABS:  BMP Latest Ref Rng & Units 07/22/2019 04/23/2019 09/06/2018  Glucose 65 - 99 mg/dL 99 111(H) 101(H)  BUN 7 - 25 mg/dL _0 Creatinine 0.70 - 1.18 mg/dL 0.96 0.98 0.99  BUN/Creat Ratio 6 - 22 (calc) NOT APPLICABLE NOT APPLICABLE 17  Sodium 031 - 146 mmol/L 141 142 141  Potassium 3.5 - 5.3 mmol/L 4.0 3.9 4.1  Chloride 98 - 110 mmol/L 107 109 101  CO2 20 - 32 mmol/L _1 Calcium 8.6 - 10.3 mg/dL 9.3 9.1 9.6   CBC Latest Ref Rng  & Units 07/22/2019 02/19/2018 11/29/2017  WBC 3.8 - 10.8 Thousand/uL 7.0 6.7 6.7  Hemoglobin 13.2 - 17.1 g/dL 15.9 14.6 15.1  Hematocrit 38.5 - 50.0 % 47.1 43.0 44.4  Platelets 140 - 400 Thousand/uL 205 188 199   Lab Results  Component Value Date   TSH 3.66 10/22/2018    Lab Results  Component Value Date   HGBA1C 5.3 07/22/2019   Hepatic Function Latest Ref Rng & Units 07/22/2019 10/31/2017 05/01/2017  Total Protein 6.1 - 8.1 g/dL 5.6(L) 5.8(L) 5.6(L)  Albumin 3.6 - 5.1 g/dL - - -  AST 10 - 35 U/L _2 ALT 9 - 46 U/L _3 Alk Phosphatase 40 - 115 U/L - - -  Total Bilirubin 0.2 - 1.2 mg/dL 0.6 0.6 0.5   Lipid Panel     Component Value Date/Time   CHOL 177 07/22/2019 0843   CHOL 175 01/25/2016 0825   CHOL 191 07/22/2013 0835   TRIG 182 (H) 07/22/2019 5945  TRIG 253 (H) 07/22/2013 0835   HDL 40 07/22/2019 0843   HDL 46 01/25/2016 0825   HDL 38 (L) 07/22/2013 0835   CHOLHDL 4.4 07/22/2019 0843   VLDL 35 (H) 10/27/2016 0825   LDLCALC 107 (H) 07/22/2019 0843   LDLCALC 102 (H) 07/22/2013 3291    RADIOLOGY: No results found.  IMPRESSION:  1. Essential hypertension   2. Dyspnea on exertion   3. Grade II diastolic dysfunction   4. Hyperlipidemia with target LDL less than 70   5. Lower extremity edema   6. Atherosclerosis of native coronary artery of native heart without angina pectoris   7. Morbid obesity ([place])      ASSESSMENT AND PLAN: Mr. Hepburn is a 69 year-old gentleman who has a history of obesity, hyperlipidemia, hypertension, and GERD.  He had developed chest discomfort 2015 which ultimately led to definitive cardiac catheterization.  This revealed normal coronary arteries.  He  underwent successful surgery by Dr. Trenton Gammon involving C4-5 and C5-6 and tolerated this well from a cardiac standpoint.  He had developed some episodes of chest pain which most likely were of musculoskeletal etiology.  A CTA in 2020 showed minimal coronary plaque involving his LAD ostium  and OM1 vessel.  He also was noted to have atherosclerosis of his thoracic aorta.  His blood pressure today is mildly increased from previously despite taking amlodipine 10 mg, benazepril 40 mg, and furosemide 20 mg.  He has experienced shortness of breath and still has some residual right lower extremity edema.  On his last echo Doppler study in January 2020 he had normal systolic function with grade 2 diastolic dysfunction.  There also was mild MR mild TR and mild increased PA pressure at 35 mm.  With his diastolic dysfunction and some residual leg swelling I am electing to add spironolactone 12.5 mg daily to his medical regimen.  He will monitor his blood pressure closely.  If his systolic blood pressure is consistently in the upper 130s to 140s he will further increase spironolactone to 25 mg daily.  He is tolerating rosuvastatin 5 mg weekly in addition to Zetia.  Laboratory in November 2020 showed an LDL cholesterol of 107.  In the past he had developed CPK elevation on high-dose statin and for this reason I will not further increase.  However I did discuss diet and increased exercise.  BMI is now 40.46 and is consistent with morbid obesity.  He may be a candidate for bempedoic acid combined with Zetia in the future.  In 2 weeks I am recommending a follow-up be met to make certain potassium and renal function are stable with the addition of spironolactone.  We will see him in 4 months for reevaluation  Time spent 25 minutes Troy Sine, MD, [facility]  09/05/2019 5:28 PM

## 2019-09-05 ENCOUNTER — Encounter: Payer: Self-pay | Admitting: Cardiovascular Disease

## 2019-09-10 ENCOUNTER — Ambulatory Visit: Payer: PPO | Attending: Internal Medicine

## 2019-09-10 DIAGNOSIS — Z23 Encounter for immunization: Secondary | ICD-10-CM | POA: Insufficient documentation

## 2019-09-10 NOTE — Progress Notes (Signed)
   [id number]  Name:  Koleton Arcia.    MRN: [id number] DOB: 12-Jun-1951  09/10/2019  Mr. Mccalip was observed post Covid-19 immunization for 15 minutes without incidence. He was provided with Vaccine Information Sheet and instruction to access the V-Safe system.   Mr. Cassey was instructed to call 911 with any severe reactions post vaccine: Marland Kitchen Difficulty breathing  . Swelling of your face and throat  . A fast heartbeat  . A bad rash all over your body  . Dizziness and weakness    Immunizations Administered    Name Date Dose VIS Date Route   Pfizer COVID-19 Vaccine 09/10/2019  5:16 PM 0.3 mL 08/02/2019 Intramuscular   Manufacturer: [place]   Lot: [id number]   [place]: [id number]

## 2019-09-17 DIAGNOSIS — E785 Hyperlipidemia, unspecified: Secondary | ICD-10-CM | POA: Diagnosis not present

## 2019-09-17 DIAGNOSIS — I10 Essential (primary) hypertension: Secondary | ICD-10-CM | POA: Diagnosis not present

## 2019-09-17 DIAGNOSIS — R600 Localized edema: Secondary | ICD-10-CM | POA: Diagnosis not present

## 2019-09-17 LAB — BASIC METABOLIC PANEL
BUN/Creatinine Ratio: 12 (ref 10–24)
BUN: 13 mg/dL (ref 8–27)
CO2: 22 mmol/L (ref 20–29)
Calcium: 9.9 mg/dL (ref 8.6–10.2)
Chloride: 103 mmol/L (ref 96–106)
Creatinine, Ser: 1.06 mg/dL (ref 0.76–1.27)
GFR calc Af Amer: 82 mL/min/{1.73_m2} (ref >59)
GFR calc non Af Amer: 71 mL/min/{1.73_m2} (ref >59)
Glucose: 84 mg/dL (ref 65–99)
Potassium: 4.5 mmol/L (ref 3.5–5.2)
Sodium: 142 mmol/L (ref 134–144)

## 2019-09-24 DIAGNOSIS — H40053 Ocular hypertension, bilateral: Secondary | ICD-10-CM | POA: Diagnosis not present

## 2019-09-28 ENCOUNTER — Ambulatory Visit: Payer: PPO | Attending: Internal Medicine

## 2019-09-28 DIAGNOSIS — Z23 Encounter for immunization: Secondary | ICD-10-CM | POA: Insufficient documentation

## 2019-09-28 NOTE — Progress Notes (Signed)
   [id number] Vaccination Clinic  Name:  Carlen Kahane.    MRN: [id number] DOB: 1951-02-28  09/28/2019  Mr. Minsky was observed post Covid-19 immunization for 15 minutes without incidence. He was provided with Vaccine Information Sheet and instruction to access the V-Safe system.   Mr. Nagle was instructed to call 911 with any severe reactions post vaccine: Marland Kitchen Difficulty breathing  . Swelling of your face and throat  . A fast heartbeat  . A bad rash all over your body  . Dizziness and weakness    Immunizations Administered    Name Date Dose VIS Date Route   Pfizer COVID-19 Vaccine 09/28/2019 10:56 AM 0.3 mL 08/02/2019 Intramuscular   Manufacturer: [place]   Lot: [id number]   [place]: [id number]

## 2019-10-01 ENCOUNTER — Ambulatory Visit: Payer: PPO

## 2019-10-07 ENCOUNTER — Other Ambulatory Visit: Payer: Self-pay

## 2019-10-07 ENCOUNTER — Encounter: Payer: Self-pay | Admitting: Cardiovascular Disease

## 2019-10-07 ENCOUNTER — Ambulatory Visit: Payer: PPO | Admitting: Cardiovascular Disease

## 2019-10-07 VITALS — BP 131/79 | HR 82 | Ht 71.0 in | Wt 280.4 lb

## 2019-10-07 DIAGNOSIS — R0600 Dyspnea, unspecified: Secondary | ICD-10-CM | POA: Diagnosis not present

## 2019-10-07 DIAGNOSIS — R079 Chest pain, unspecified: Secondary | ICD-10-CM | POA: Diagnosis not present

## 2019-10-07 DIAGNOSIS — I519 Heart disease, unspecified: Secondary | ICD-10-CM

## 2019-10-07 DIAGNOSIS — E785 Hyperlipidemia, unspecified: Secondary | ICD-10-CM | POA: Diagnosis not present

## 2019-10-07 DIAGNOSIS — I2510 Atherosclerotic heart disease of native coronary artery without angina pectoris: Secondary | ICD-10-CM | POA: Diagnosis not present

## 2019-10-07 DIAGNOSIS — R0609 Other forms of dyspnea: Secondary | ICD-10-CM

## 2019-10-07 DIAGNOSIS — I10 Essential (primary) hypertension: Secondary | ICD-10-CM | POA: Diagnosis not present

## 2019-10-07 DIAGNOSIS — I5189 Other ill-defined heart diseases: Secondary | ICD-10-CM

## 2019-10-07 MED ORDER — SPIRONOLACTONE 25 MG PO TABS: 25.0000 mg | ORAL_TABLET | Freq: Every day | ORAL | 3 refills | Status: DC

## 2019-10-07 NOTE — Patient Instructions (Signed)
Medication Instructions:  INCREASE SPIRONOLACTONE TO 25 MG DAILY (1 TABLET) *If you need a refill on your cardiac medications before your next appointment, please call your pharmacy*  Lab Work: IN 6-8 WEEKS, FASTING LAB WORK: CMET  LIPID If you have labs (blood work) drawn today and your tests are completely normal, you will receive your results only by: Marland Kitchen MyChart Message (if you have MyChart) OR . A paper copy in the mail If you have any lab test that is abnormal or we need to change your treatment, we will call you to review the results.   Follow-Up: At [facility], you and your health needs are our priority.  As part of our continuing mission to provide you with exceptional heart care, we have created designated Provider Care Teams.  These Care Teams include your primary Cardiologist (physician) and Advanced Practice Providers (APPs -  Physician Assistants and Nurse Practitioners) who all work together to provide you with the care you need, when you need it.  Your next appointment:   3 month(s)  The format for your next appointment:   In Person  Provider:   Shelva Majestic, MD

## 2019-10-07 NOTE — Progress Notes (Signed)
Patient ID: Anthony [name]., male   DOB: [birth year], 69 y.o.   MRN: [id number]     HPI: Anthony [name]. is a 69 y.o. male who presents to the office for a 6   month  follow-up cardiology evaluation.   [name] has a history of hypertension, mild obesity, as well as hyperlipidemia. In the past, he did develop CPK elevation secondary to statin therapy. He has been able to tolerate Zetia. A nuclear perfusion study done in October 2013 for chest pain showed normal perfusion without scar or ischemia.   Laboratory in October 2014 showed cholesterol 196, triglycerides significantly elevated at 291, HDL of 34, VLDL increased at 58, and  LDL 106. Renal function was normal. BUN 15 kerning 0.79.   He developed recurrent episodes of chest pain for which he saw Anthony [name] and because of exertionally precipitated episodes of discomfort definitive cardiac catheterization was recommended. This was done by me on 09/05/2013 and revealed normal coronary arteries with normal LV function without focal segmental wall motion abnormalities. Ejection fraction was 55%.  He underwent an echo Doppler study on 09/04/2013 which showed an ejection fraction of 55-60%. He did have grade 1 diastolic dysfunction and mild left ventricular hypertrophy. He had mild mitral annular calcification with mildly thickened leaflets and trivial mitral regurgitation. A moderate pressure estimate was 27 mm.  He was  seen by [name] on 08/17/2016 with complaints of chest discomfort.  He's episodes would occur often when he was walking on his driveway her in the grocery store and had been ongoing for 1-2 months.  He had diffuse tenderness to palpation along his left pectoral region.  On exam it was felt that some of his chest pain was musculoskeletal in etiology.  He was hypertensive and she further titrated amlodipine.  Because of exertional dyspnea.  She referred him for an echo Doppler study which was done on 08/23/2016.  This showed  mild LVH with normal systolic function and grade 1 diastolic dysfunction.  There is very mild increased peak PA pressure 34 mm.  I saw him in January 2019 after not having seen him since February 2015.  At that time, his pressure was elevated despite taking amlodipine 7.5 mg.  I recommended further titration to 10 mg daily.  On this increased dose, he states his blood pressure at home typically has been running in the 130-135 range.  He denies chest pain PND orthopnea.  He has a history of GERD which is controlled with Nexium.  He has had some issues with left arm paresthesias and was diagnosed with significant cervical degenerative disc disease.  He is scheduled to undergo neck surgery on December 12, 2017 by Dr. Trenton Gammon involving C4-5 and C5-6.  Preoperative surgical clearance was recommended.  He denies any chest pain or shortness of breath.  He has not been exercising as he had in the past in the winter months but hopes to do so following his surgery and with improved weather.    When I saw him in April 2019 he was given surgical clearance for his cervical surgery.  He underwent successful surgery by Dr. Trenton Gammon in July 2019 involving C4-5 and C5-6 and tolerated this well.   I last saw him in January 2020 at that time he had begun to notice some episodes of chest wall discomfort.  He denied any clear-cut exertional precipitation and noted the chest discomfort intermittently.  He was unable to walk well secondary to bilateral knee and  hip replacement surgery.  He also had experience of mild exertional shortness of breath.  I felt most likely that his chest pain was of musculoskeletal etiology.  However with his exertional dyspnea I recommended reevaluation of his coronary anatomy and suggested CT coronary angiography and also a 2-year follow-up echo Doppler assessment.  His echo study was done on August 30, 2018 and showed an EF of 60 to 65% without wall motion abnormalities.  There was grade 2 diastolic  dysfunction.  There was mild increased PA pressure at 35 mm.  There was mild MR.  Coronary CTA yielded calcium score of 26.4 which was 20th percentile for age and sex matched control.  He had normal coronary origin with right dominance.  There was minimal plaque in the ostial LAD and OM1.  CT demonstrated atherosclerotic disease in the descending thoracic aorta.  Last saw him on March 04, 2019 at which time he denied any chest pain, palpitations or dyspnea.  He was experiencing bilateral leg swelling despite taking HCTZ.   During this COVID pandemic, he has gained approximately 15 pounds.  He has not been exercising due to hip discomfort.  Laboratory in March 2020 showed a total cholesterol 173, triglycerides 143 HDL 46 and LDL 103.  During that evaluation I recommended he discontinue hydrochlorothiazide and in its place start furosemide 40 mg for 2 days and then 20 mg daily.  I saw him in follow-up on September 03, 2019.  Prior to that evaluation he was  on amlodipine 10 mg, benazepril 40 mg, furosemide 20 mg daily.  At times he does experience shortness of breath and notes swelling right leg greater than left.  He has been able to take Zetia 10 mg in addition to very low-dose rosuvastatin 5 mg once a week and is tolerating this well.  Recently he has noticed slight increase in the shortness of breath.  He admits to further weight gain.  Laboratory 1 month ago showed total cholesterol 177, HDL 40 triglycerides 182 LDL 107.  BUN 13 creatinine 0.96.  During that evaluation I recommended the initiation of spironolactone 12.5 mg both for improved blood pressure as well as for diastolic dysfunction.  Repeat chemistry profile showed a potassium of 4.5.  BUN 13 and creatinine 1.06.  Over the past month, he has had purposeful weight loss contributed by reduction in his meal servings.  He did receive his Covid vaccination.  He did experience some nonexertional shoulder chest soreness following the vaccination.  He denies  any exertional chest pain symptomatology.  He presents for evaluation.  Past Medical History:  Diagnosis Date  . Benign neoplasm of colon   . Benign neoplasm of skin of upper limb, including shoulder   . Carpal tunnel syndrome   . Chest pain    a. normal cors by cath in 2015  . Diaphragmatic hernia without mention of obstruction or gangrene   . Dyslipidemia   . Elevated prostate specific antigen (PSA)   . GERD (gastroesophageal reflux disease)   . Herpes zoster without mention of complication   . History of hiatal hernia   . Hypertension   . Hypertrophy of prostate with urinary obstruction and other lower urinary tract symptoms (LUTS)   . Impotence of organic origin   . Lumbago   . Obesity, unspecified   . Osteoarthrosis, unspecified whether generalized or localized, lower leg   . Other and unspecified hyperlipidemia   . Other malaise and fatigue   . Other seborrheic keratosis   .  Special screening for malignant neoplasm of prostate   . Stenosis, cervical spine   . Unspecified tinnitus     Past Surgical History:  Procedure Laterality Date  . ANTERIOR CERVICAL DECOMP/DISCECTOMY FUSION N/A 02/19/2018   Procedure: ANTERIOR CERVICAL DECOMPRESSION/DISCECTOMY FUSION - CERVICAL FOUR-CERVICAL FIVE - CERVICAL FIVE-CERVICAL SIX;  Surgeon: Earnie Larsson, MD;  Location: [place];  Service: Neurosurgery;  Laterality: N/A;  . CARDIOVASCULAR STRESS TEST  06/15/2012   Non-diagnostic for ischemia. No lexiscan EKG changes.  . CHOLECYSTECTOMY  1992  . EYE SURGERY    . eyeband  2006   removal of right eyeband  . INGUINAL HERNIA REPAIR  1984   left  . INTRAOCULAR LENS REMOVAL  2008   right  . LEFT HEART CATHETERIZATION WITH CORONARY ANGIOGRAM N/A 09/05/2013   Procedure: LEFT HEART CATHETERIZATION WITH CORONARY ANGIOGRAM;  Surgeon: Troy Sine, MD;  Location: [facility] CATH LAB;  Service: Cardiovascular;  Laterality: N/A;  . LOWER VENOUS EXTREMITY DOPPLER  01/26/2011   No evidence of thrombus or  thrombophlebitis.  Marland Kitchen RETINAL DETACHMENT SURGERY  2001   right  . TOTAL HIP ARTHROPLASTY  2012   bilateral  . TOTAL KNEE ARTHROPLASTY  2011, 2012   bilateral  . TRANSTHORACIC ECHOCARDIOGRAM  10/05/2009   EF >55%, normal LV size, systolic, and diastolic function.    Allergies  Allergen Reactions  . Lipitor [Atorvastatin] Other (See Comments)    Leg pain/cramps; statin myopathy  . Red Yeast Rice [Cholestin] Other (See Comments)    Makes legs hurt, myopathy  . Statins Other (See Comments)    "muscle break down", statin myopathy  . Codeine Other (See Comments)    Headache  . Latex Rash    Current Outpatient Medications  Medication Sig Dispense Refill  . amLODipine (NORVASC) 10 MG tablet TAKE 1 TABLET BY MOUTH EVERY DAY IN THE MORNING 90 tablet 1  . aspirin EC 81 MG tablet Take 81 mg by mouth every morning.    . benazepril (LOTENSIN) 40 MG tablet TAKE 1 TABLET BY MOUTH EVERY DAY IN THE MORNING 90 tablet 1  . brimonidine (ALPHAGAN P) 0.1 % SOLN Place 1 drop into the right eye 2 (two) times daily.    . dorzolamide-timolol (COSOPT) 22.3-6.8 MG/ML ophthalmic solution Place 2 drops into the right eye 2 (two) times daily.     Marland Kitchen esomeprazole (NEXIUM) 40 MG capsule TAKE 1 CAPSULE BY MOUTH AT BEDTIME. FOR REFLUX 90 capsule 1  . ezetimibe (ZETIA) 10 MG tablet TAKE 1 TABLET BY MOUTH EVERY DAY 90 tablet 1  . finasteride (PROSCAR) 5 MG tablet TAKE 1 TABLET BY MOUTH EVERY DAY IN THE MORNING 90 tablet 1  . furosemide (LASIX) 40 MG tablet Take 1 tablet by mouth for 2 days, then 1/2 tablet (20 mg) daily 90 tablet 3  . Polyvinyl Alcohol-Povidone (REFRESH OP) Place 1 drop into the right eye daily as needed (dry eyes/irritation).    . rosuvastatin (CRESTOR) 5 MG tablet TAKE 1 TABLET (5 MG TOTAL) BY MOUTH ONCE A WEEK. 90 tablet 3  . spironolactone (ALDACTONE) 25 MG tablet Take 1 tablet (25 mg total) by mouth daily. Take 34m daily 90 tablet 3   No current facility-administered medications for this visit.      Social History   Socioeconomic History  . Marital status: Married    Spouse name: Not on file  . Number of children: Not on file  . Years of education: Not on file  . Highest education level: Not on  file  Occupational History  . Occupation: retired Medical sales representative     Comment: Hospital doctor  Tobacco Use  . Smoking status: Never Smoker  . Smokeless tobacco: Never Used  Substance and Sexual Activity  . Alcohol use: No  . Drug use: No  . Sexual activity: Yes    Partners: Female    Comment: wife  Other Topics Concern  . Not on file  Social History Narrative   Married   Never smoked   Alcohol none   Exercise -gym 3 days a week (join 3 months ago)   Living Will   Social Determinants of Health   Financial Resource Strain:   . Difficulty of Paying Living Expenses: Not on file  Food Insecurity:   . Worried About Charity fundraiser in the Last Year: Not on file  . Ran Out of Food in the Last Year: Not on file  Transportation Needs:   . Lack of Transportation (Medical): Not on file  . Lack of Transportation (Non-Medical): Not on file  Physical Activity:   . Days of Exercise per Week: Not on file  . Minutes of Exercise per Session: Not on file  Stress:   . Feeling of Stress : Not on file  Social Connections:   . Frequency of Communication with Friends and Family: Not on file  . Frequency of Social Gatherings with Friends and Family: Not on file  . Attends Religious Services: Not on file  . Active Member of Clubs or Organizations: Not on file  . Attends Archivist Meetings: Not on file  . Marital Status: Not on file  Intimate Partner Violence:   . Fear of Current or Ex-Partner: Not on file  . Emotionally Abused: Not on file  . Physically Abused: Not on file  . Sexually Abused: Not on file   Social is notable that he is married has 2 children and 2 grandchildren. He is not routinely exercise. There is no tobacco use. He does drink occasional  alcohol.  Family History  Problem Relation Age of Onset  . Diabetes Mother   . Arrhythmia Mother   . Hypertension Mother   . Dementia Father   . Pulmonary embolism Father   . Hypertension Father   . Cancer Sister        Liver cancer - Histocytic lymphoma  . Cancer Sister 73       Skin cancer  . Hypertension Sister 56  . Diabetes Sister   . Stroke Brother   . Diabetes Brother   . Hypertension Brother   . Cancer Daughter   . Cancer Maternal Grandmother        Skin cancer  . Heart attack Maternal Grandfather   . Cancer Paternal Grandfather   . Hypertension Paternal Grandfather     ROS General: Negative; No fevers, chills, or night sweats;  positive for a 16 pound weight loss in September 2018 HEENT: Negative; No changes in vision or hearing, sinus congestion, difficulty swallowing Pulmonary: Negative; No cough, wheezing, shortness of breath, hemoptysis Cardiovascular: Positive for occasional exertional shortness of breath.  No exertional chest pain.  Positive for right leg greater than left swelling GI: Positive for GERD  GU: Negative; No dysuria, hematuria, or difficulty voiding Musculoskeletal: Lateral hip and knee discomfort; status post recent surgery C4-5/C5-6 Hematologic/Oncology: Negative; no easy bruising, bleeding Endocrine: Negative; no heat/cold intolerance; no diabetes Neuro: Mild left arm paresthesias from his cervical degenerative disease Skin: Negative; No rashes or skin lesions Psychiatric:  Negative; No behavioral problems, depression Sleep: Negative; No snoring, daytime sleepiness, hypersomnolence, bruxism, restless legs, hypnogognic hallucinations, no cataplexy Other comprehensive 14 point system review is negative.  PE BP 131/79   Pulse 82   Ht 5' 11"  (1.803 m)   Wt 280 lb 6.4 oz (127.2 kg)   SpO2 95%   BMI 39.11 kg/m    Repeat blood pressure by me was 138/78  Wt Readings from Last 3 Encounters:  10/07/19 280 lb 6.4 oz (127.2 kg)  09/03/19 282  lb (127.9 kg)  07/29/19 278 lb 3.2 oz (126.2 kg)   General: Alert, oriented, no distress.  Skin: normal turgor, no rashes, warm and dry HEENT: Normocephalic, atraumatic. Pupils equal round and reactive to light; sclera anicteric; extraocular muscles intact;  Nose without nasal septal hypertrophy Mouth/Parynx benign; Mallinpatti scale 3 Neck: No JVD, no carotid bruits; normal carotid upstroke Lungs: clear to ausculatation and percussion; no wheezing or rales Chest wall: without tenderness to palpitation Heart: PMI not displaced, RRR, s1 s2 normal, 1/6 systolic murmur, no diastolic murmur, no rubs, gallops, thrills, or heaves Abdomen: soft, nontender; no hepatosplenomehaly, BS+; abdominal aorta nontender and not dilated by palpation. Back: no CVA tenderness Pulses 2+ Musculoskeletal: full range of motion, normal strength, no joint deformities Extremities: Previous right lower extremity edema; no clubbing cyanosis or edema, Homan's sign negative  Neurologic: grossly nonfocal; Cranial nerves grossly wnl Psychologic: Normal mood and affect   January 12,2021 ECG (independently read by me): Normal sinus rhythm at 81 bpm.  No ectopy.  Normal intervals.  July 2020 ECG (independently read by me): NSR at 70  January 2020 ECG (independently read by me): Normal sinus rhythm at 74 bpm.  No ectopy.  No ST segment changes.  April 2019 ECG (independently read by me): Normal sinus rhythm at 66 bpm.  Normal intervals.  No ectopy.  No ST segment changes.  August 28, 2017 ECG (independently read by me): Normal sinus rhythm at 63 bpm.  Normal intervals.  No ST segment changes.  February 2015 ECG (independently read by me): Normal sinus rhythm at 66 beats per minute. PR interval 160 ms. QTC intervals 34 ms. No significant ST-T changes.  Prior ECG from November 2014 :Normal sinus rhythm at 69 beats per minute. No ectopy. Normal intervals.  LABS:  BMP Latest Ref Rng & Units 09/17/2019 07/22/2019 04/23/2019   Glucose 65 - 99 mg/dL 84 99 111(H)  BUN 8 - 27 mg/dL 13 13 14   Creatinine 0.76 - 1.27 mg/dL 1.06 0.96 0.98  BUN/Creat Ratio 10 - 24 12 NOT APPLICABLE NOT APPLICABLE  Sodium 503 - 144 mmol/L 142 141 142  Potassium 3.5 - 5.2 mmol/L 4.5 4.0 3.9  Chloride 96 - 106 mmol/L 103 107 109  CO2 20 - 29 mmol/L 22 27 28   Calcium 8.6 - 10.2 mg/dL 9.9 9.3 9.1   CBC Latest Ref Rng & Units 07/22/2019 02/19/2018 11/29/2017  WBC 3.8 - 10.8 Thousand/uL 7.0 6.7 6.7  Hemoglobin 13.2 - 17.1 g/dL 15.9 14.6 15.1  Hematocrit 38.5 - 50.0 % 47.1 43.0 44.4  Platelets 140 - 400 Thousand/uL 205 188 199   Lab Results  Component Value Date   TSH 3.66 10/22/2018    Lab Results  Component Value Date   HGBA1C 5.3 07/22/2019   Hepatic Function Latest Ref Rng & Units 07/22/2019 10/31/2017 05/01/2017  Total Protein 6.1 - 8.1 g/dL 5.6(L) 5.8(L) 5.6(L)  Albumin 3.6 - 5.1 g/dL - - -  AST 10 - 35 U/L 15  23 17  ALT 9 - 46 U/L 21 21 23   Alk Phosphatase 40 - 115 U/L - - -  Total Bilirubin 0.2 - 1.2 mg/dL 0.6 0.6 0.5   Lipid Panel     Component Value Date/Time   CHOL 177 07/22/2019 0843   CHOL 175 01/25/2016 0825   CHOL 191 07/22/2013 0835   TRIG 182 (H) 07/22/2019 0843   TRIG 253 (H) 07/22/2013 0835   HDL 40 07/22/2019 0843   HDL 46 01/25/2016 0825   HDL 38 (L) 07/22/2013 0835   CHOLHDL 4.4 07/22/2019 0843   VLDL 35 (H) 10/27/2016 0825   LDLCALC 107 (H) 07/22/2019 0843   LDLCALC 102 (H) 07/22/2013 2158    RADIOLOGY: No results found.  IMPRESSION:  1. Essential hypertension   2. Hyperlipidemia with target LDL less than 70   3. Atherosclerosis of native coronary artery of native heart without angina pectoris   4. Dyspnea on exertion   5. Chest pain, unspecified type   6. Grade II diastolic dysfunction      ASSESSMENT AND PLAN: Mr. Picotte is a 69 year-old gentleman who has a history of obesity, hyperlipidemia, hypertension, and GERD.  He had developed chest discomfort 2015 which ultimately led to  definitive cardiac catheterization.  This revealed normal coronary arteries.  He  underwent successful surgery by Dr. Trenton Gammon involving C4-5 and C5-6 and tolerated this well from a cardiac standpoint.  He had developed some episodes of chest pain which most likely were of musculoskeletal etiology.  A CTA in 2020 showed minimal coronary plaque involving his LAD ostium and OM1 vessel.  He also was noted to have atherosclerosis of his thoracic aorta.  When seen at his last office visit, spironolactone 12.5 mg was added to his medical regimen for improved blood pressure control but less potential improvement in his exertional shortness of breath most likely contributed by diastolic dysfunction.  Blood pressure today is improved but on repeat by me was 138/78.  I am suggesting further titration of spironolactone to 25 mg daily.  He will continue amlodipine 10 mg, benazepril 40 mg and is furosemide 20 mg daily.  He continues to tolerate rosuvastatin 5 mg weekly in addition to Zetia 10 mg daily.  Remotely he had developed rhabdomyolysis on higher dose statin therapy.  He is now initiated a weight loss program.  I will wait several additional months to recheck his laboratory.  If LDL continues to be elevated I would suggest transitioning him to combination bempedoic acid plus Zetia.  He continues to be obese with a BMI of 39.1.  We discussed short-term weight goal of less than 250 pounds and presently he is at 280.4 pounds.  In 6 to 8 weeks I will recheck a chemistry profile and lipid studies.  I will see him in 3 months for cardiology follow-up evaluation.   Troy Sine, MD, [facility]  10/07/2019 9:02 AM

## 2019-10-22 ENCOUNTER — Other Ambulatory Visit: Payer: Self-pay | Admitting: Internal Medicine

## 2019-11-25 ENCOUNTER — Other Ambulatory Visit: Payer: Self-pay

## 2019-11-25 ENCOUNTER — Other Ambulatory Visit: Payer: PPO

## 2019-11-25 DIAGNOSIS — I10 Essential (primary) hypertension: Secondary | ICD-10-CM

## 2019-11-25 DIAGNOSIS — E7849 Other hyperlipidemia: Secondary | ICD-10-CM | POA: Diagnosis not present

## 2019-11-25 DIAGNOSIS — R739 Hyperglycemia, unspecified: Secondary | ICD-10-CM

## 2019-11-25 DIAGNOSIS — Z0000 Encounter for general adult medical examination without abnormal findings: Secondary | ICD-10-CM

## 2019-11-26 LAB — CBC WITH DIFFERENTIAL/PLATELET
Absolute Monocytes: 710 cells/uL (ref 200–950)
Basophils Absolute: 86 cells/uL (ref 0–200)
Basophils Relative: 1.1 %
Eosinophils Absolute: 281 cells/uL (ref 15–500)
Eosinophils Relative: 3.6 %
HCT: 47.5 % (ref 38.5–50.0)
Hemoglobin: 16.1 g/dL (ref 13.2–17.1)
Lymphs Abs: 1771 cells/uL (ref 850–3900)
MCH: 29.9 pg (ref 27.0–33.0)
MCHC: 33.9 g/dL (ref 32.0–36.0)
MCV: 88.1 fL (ref 80.0–100.0)
MPV: 11.2 fL (ref 7.5–12.5)
Monocytes Relative: 9.1 %
Neutro Abs: 4953 cells/uL (ref 1500–7800)
Neutrophils Relative %: 63.5 %
Platelets: 220 10*3/uL (ref 140–400)
RBC: 5.39 10*6/uL (ref 4.20–5.80)
RDW: 13.1 % (ref 11.0–15.0)
Total Lymphocyte: 22.7 %
WBC: 7.8 10*3/uL (ref 3.8–10.8)

## 2019-11-26 LAB — COMPLETE METABOLIC PANEL WITH GFR
AG Ratio: 2.5 (calc) (ref 1.0–2.5)
ALT: 19 U/L (ref 9–46)
AST: 14 U/L (ref 10–35)
Albumin: 4.2 g/dL (ref 3.6–5.1)
Alkaline phosphatase (APISO): 86 U/L (ref 35–144)
BUN: 20 mg/dL (ref 7–25)
CO2: 28 mmol/L (ref 20–32)
Calcium: 9.5 mg/dL (ref 8.6–10.3)
Chloride: 108 mmol/L (ref 98–110)
Creat: 0.96 mg/dL (ref 0.70–1.18)
GFR, Est African American: 92 mL/min/{1.73_m2} (ref >=60)
GFR, Est Non African American: 80 mL/min/{1.73_m2} (ref >=60)
Globulin: 1.7 g/dL (calc) — ABNORMAL LOW (ref 1.9–3.7)
Glucose, Bld: 107 mg/dL — ABNORMAL HIGH (ref 65–99)
Potassium: 4.1 mmol/L (ref 3.5–5.3)
Sodium: 143 mmol/L (ref 135–146)
Total Bilirubin: 0.5 mg/dL (ref 0.2–1.2)
Total Protein: 5.9 g/dL — ABNORMAL LOW (ref 6.1–8.1)

## 2019-11-26 LAB — HEMOGLOBIN A1C
Hgb A1c MFr Bld: 5.4 % of total Hgb (ref <5.7)
Mean Plasma Glucose: 108 (calc)
eAG (mmol/L): 6 (calc)

## 2019-11-26 LAB — LIPID PANEL
Cholesterol: 172 mg/dL (ref <200)
HDL: 42 mg/dL (ref >=40)
LDL Cholesterol (Calc): 105 mg/dL (calc) — ABNORMAL HIGH
Non-HDL Cholesterol (Calc): 130 mg/dL (calc) — ABNORMAL HIGH (ref <130)
Total CHOL/HDL Ratio: 4.1 (calc) (ref <5.0)
Triglycerides: 141 mg/dL (ref <150)

## 2019-11-26 LAB — TSH: TSH: 3.27 mIU/L (ref 0.40–4.50)

## 2019-11-26 NOTE — Progress Notes (Signed)
Thyroid is in normal range. Sugar average is in normal range. Bad cholesterol remains above goal of less than 70 due to his cardiovascular risk factors.  Triglycerides (starchy fats) have improved to normal range. Has he been taking his crestor weekly?  Is he taking the zetia daily?

## 2019-11-28 ENCOUNTER — Ambulatory Visit (INDEPENDENT_AMBULATORY_CARE_PROVIDER_SITE_OTHER): Payer: PPO | Admitting: Internal Medicine

## 2019-11-28 ENCOUNTER — Encounter: Payer: Self-pay | Admitting: Internal Medicine

## 2019-11-28 ENCOUNTER — Other Ambulatory Visit: Payer: Self-pay

## 2019-11-28 VITALS — BP 118/58 | HR 75 | Temp 97.8°F | Ht 71.0 in | Wt 273.5 lb

## 2019-11-28 DIAGNOSIS — I10 Essential (primary) hypertension: Secondary | ICD-10-CM | POA: Diagnosis not present

## 2019-11-28 DIAGNOSIS — M7061 Trochanteric bursitis, right hip: Secondary | ICD-10-CM

## 2019-11-28 DIAGNOSIS — M7062 Trochanteric bursitis, left hip: Secondary | ICD-10-CM

## 2019-11-28 DIAGNOSIS — E7849 Other hyperlipidemia: Secondary | ICD-10-CM | POA: Diagnosis not present

## 2019-11-28 DIAGNOSIS — R739 Hyperglycemia, unspecified: Secondary | ICD-10-CM | POA: Diagnosis not present

## 2019-11-28 DIAGNOSIS — K222 Esophageal obstruction: Secondary | ICD-10-CM | POA: Diagnosis not present

## 2019-11-28 DIAGNOSIS — H40053 Ocular hypertension, bilateral: Secondary | ICD-10-CM

## 2019-11-28 NOTE — Patient Instructions (Addendum)
Stop crestor. Let's try to get you approved for praluent or repatha thru the cardiology pharmacy folks since you are not tolerating statins.  Gradually increase your mileage walking and try to do the flat portion only so it does not bother your hips as much.

## 2019-11-28 NOTE — Progress Notes (Signed)
Location:  [facility] clinic Provider:  Davelyn Gwinn L. Mariea Clonts, D.O., C.M.D.  Goals of Care:  Advanced Directives 11/28/2019  Does Patient Have a Medical Advance Directive? Yes  Type of Advance Directive -  Does patient want to make changes to medical advance directive? No - Patient declined  Copy of [place] in Chart? -  Would patient like information on creating a medical advance directive? -     Chief Complaint  Patient presents with  . Medical Management of Chronic Issues    4 month follow up     HPI: Patient is a 69 y.o. [name] seen today for medical management of chronic diseases.    He's having cramps in his abdomen.  Previously has already had cramping of his fingers with drawing up and some in his legs.  Feels like the cramps move.    He also feels nauseous once a week.  Takes the crestor 5mg  on tuesdays. He did not tolerate lipitor previously due to cramping.  He actually had muscle breakdown in his bloodwork on the lipitor.     He has lost 7 lbs.  He has cut back on eating with sweets.  He has tried walking.  His hips and arthritis make it challenging.  He was walking with his neighbor who'd had stents put in.  He can go a half mile.  Lateral hips start hurting.  He holds onto a cart at the grocery store or [facility] [employer].  That makes it sound like his back.  He did have the epidurals and then ablation on his lower back.  He is trying to get his yardwork for spring done and then go back to them if needed.    He had a laser procedure on the right eye a month ago for ocular htn and remains on drops.  It did help his vision and not painful like it had been.  Takes drops early am 6:30-7 and he knows when he's due for the evening drops.  Swallowing difficulty is the same.  He hates to do anything with covid going on.    He's had both covid vaccines.  Past Medical History:  Diagnosis Date  . Benign neoplasm of colon   . Benign neoplasm of skin of upper limb, including  shoulder   . Carpal tunnel syndrome   . Chest pain    a. normal cors by cath in 2015  . Diaphragmatic hernia without mention of obstruction or gangrene   . Dyslipidemia   . Elevated prostate specific antigen (PSA)   . GERD (gastroesophageal reflux disease)   . Herpes zoster without mention of complication   . History of hiatal hernia   . Hypertension   . Hypertrophy of prostate with urinary obstruction and other lower urinary tract symptoms (LUTS)   . Impotence of organic origin   . Lumbago   . Obesity, unspecified   . Osteoarthrosis, unspecified whether generalized or localized, lower leg   . Other and unspecified hyperlipidemia   . Other malaise and fatigue   . Other seborrheic keratosis   . Special screening for malignant neoplasm of prostate   . Stenosis, cervical spine   . Unspecified tinnitus     Past Surgical History:  Procedure Laterality Date  . ANTERIOR CERVICAL DECOMP/DISCECTOMY FUSION N/A 02/19/2018   Procedure: ANTERIOR CERVICAL DECOMPRESSION/DISCECTOMY FUSION - CERVICAL FOUR-CERVICAL FIVE - CERVICAL FIVE-CERVICAL SIX;  Surgeon: Earnie Larsson, MD;  Location: [place];  Service: Neurosurgery;  Laterality: N/A;  . CARDIOVASCULAR  STRESS TEST  06/15/2012   Non-diagnostic for ischemia. No lexiscan EKG changes.  . CHOLECYSTECTOMY  1992  . EYE SURGERY    . eyeband  2006   removal of right eyeband  . INGUINAL HERNIA REPAIR  1984   left  . INTRAOCULAR LENS REMOVAL  2008   right  . LEFT HEART CATHETERIZATION WITH CORONARY ANGIOGRAM N/A 09/05/2013   Procedure: LEFT HEART CATHETERIZATION WITH CORONARY ANGIOGRAM;  Surgeon: Troy Sine, MD;  Location: [facility] CATH LAB;  Service: Cardiovascular;  Laterality: N/A;  . LOWER VENOUS EXTREMITY DOPPLER  01/26/2011   No evidence of thrombus or thrombophlebitis.  Marland Kitchen RETINAL DETACHMENT SURGERY  2001   right  . TOTAL HIP ARTHROPLASTY  2012   bilateral  . TOTAL KNEE ARTHROPLASTY  2011, 2012   bilateral  . TRANSTHORACIC ECHOCARDIOGRAM  10/05/2009    EF >55%, normal LV size, systolic, and diastolic function.    Allergies  Allergen Reactions  . Lipitor [Atorvastatin] Other (See Comments)    Leg pain/cramps; statin myopathy  . Red Yeast Rice [Cholestin] Other (See Comments)    Makes legs hurt, myopathy  . Statins Other (See Comments)    "muscle break down", statin myopathy  . Codeine Other (See Comments)    Headache  . Latex Rash    Outpatient Encounter Medications as of 11/28/2019  Medication Sig  . amLODipine (NORVASC) 10 MG tablet TAKE 1 TABLET BY MOUTH EVERY DAY IN THE MORNING  . aspirin EC 81 MG tablet Take 81 mg by mouth every morning.  . benazepril (LOTENSIN) 40 MG tablet TAKE 1 TABLET BY MOUTH EVERY DAY IN THE MORNING  . brimonidine (ALPHAGAN P) 0.1 % SOLN Place 1 drop into the right eye 2 (two) times daily.  . dorzolamide-timolol (COSOPT) 22.3-6.8 MG/ML ophthalmic solution Place 2 drops into the right eye 2 (two) times daily.   Marland Kitchen esomeprazole (NEXIUM) 40 MG capsule TAKE 1 CAPSULE BY MOUTH AT BEDTIME. FOR REFLUX  . ezetimibe (ZETIA) 10 MG tablet TAKE 1 TABLET BY MOUTH EVERY DAY  . finasteride (PROSCAR) 5 MG tablet TAKE 1 TABLET BY MOUTH EVERY DAY IN THE MORNING  . furosemide (LASIX) 40 MG tablet Take 1 tablet by mouth for 2 days, then 1/2 tablet (20 mg) daily  . Polyvinyl Alcohol-Povidone (REFRESH OP) Place 1 drop into the right eye daily as needed (dry eyes/irritation).  Marland Kitchen spironolactone (ALDACTONE) 25 MG tablet Take 1 tablet (25 mg total) by mouth daily. Take 25mg  daily  . rosuvastatin (CRESTOR) 5 MG tablet TAKE 1 TABLET (5 MG TOTAL) BY MOUTH ONCE A WEEK.   No facility-administered encounter medications on file as of 11/28/2019.    Review of Systems:  Review of Systems  Constitutional: Negative for chills, fever and malaise/fatigue.  HENT: Negative for congestion and sore throat.   Eyes: Positive for pain and redness.       Right eye  Respiratory: Negative for cough and shortness of breath.   Cardiovascular:  Negative for chest pain, palpitations and leg swelling.  Gastrointestinal: Positive for abdominal pain. Negative for blood in stool, constipation, diarrhea and melena.       Cramping in abdomen at random times  Genitourinary: Negative for dysuria.  Musculoskeletal: Positive for back pain and joint pain. Negative for falls.  Neurological: Negative for dizziness and loss of consciousness.  Endo/Heme/Allergies: Bruises/bleeds easily.  Psychiatric/Behavioral: Negative for depression and memory loss. The patient is not nervous/anxious and does not have insomnia.     Health Maintenance  Topic  Date Due  . INFLUENZA VACCINE  03/22/2020  . TETANUS/TDAP  08/29/2021  . COLONOSCOPY  05/08/2022  . Hepatitis C Screening  Discontinued  . PNA vac Low Risk Adult  Discontinued    Physical Exam: Vitals:   11/28/19 0735  Weight: 273 lb 8 oz (124.1 kg)  Height: 5\' 11"  (1.803 m)   Body mass index is 38.15 kg/m. Physical Exam Vitals reviewed.  Constitutional:      General: He is not in acute distress.    Appearance: Normal appearance. He is not toxic-appearing.  HENT:     Head: Normocephalic and atraumatic.  Cardiovascular:     Rate and Rhythm: Normal rate and regular rhythm.     Heart sounds: No murmur.  Pulmonary:     Effort: Pulmonary effort is normal. No respiratory distress.     Breath sounds: Normal breath sounds. No wheezing, rhonchi or rales.  Abdominal:     General: Bowel sounds are normal.     Tenderness: There is no abdominal tenderness.  Musculoskeletal:        General: Normal range of motion.     Cervical back: Neck supple.     Right lower leg: No edema.     Left lower leg: No edema.     Comments: Tenderness over trochanteric bursa b/l; also tender over SI regions  Skin:    General: Skin is warm and dry.  Neurological:     General: No focal deficit present.     Mental Status: He is alert and oriented to person, place, and time. Mental status is at baseline.  Psychiatric:         Mood and Affect: Mood normal.        Behavior: Behavior normal.        Thought Content: Thought content normal.        Judgment: Judgment normal.     Labs reviewed: Basic Metabolic Panel: Recent Labs    07/22/19 0843 09/17/19 0909 11/25/19 0804  NA 141 142 143  K 4.0 4.5 4.1  CL 107 103 108  CO2 27 22 28   GLUCOSE 99 84 107*  BUN 13 13 20   CREATININE 0.96 1.06 0.96  CALCIUM Anthony.3 Anthony.Anthony Anthony.5  TSH  --   --  3.27   Liver Function Tests: Recent Labs    07/22/19 0843 11/25/19 0804  AST 15 14  ALT 21 19  BILITOT 0.6 0.5  PROT 5.6* 5.Anthony*   No results for input(s): LIPASE, AMYLASE in the last 8760 hours. No results for input(s): AMMONIA in the last 8760 hours. CBC: Recent Labs    07/22/19 0843 11/25/19 0804  WBC 7.0 7.8  NEUTROABS 4,396 4,953  HGB 15.Anthony 16.1  HCT 47.1 47.5  MCV 86.Anthony 88.1  PLT 205 220   Lipid Panel: Recent Labs    04/23/19 0818 07/22/19 0843 11/25/19 0804  CHOL 180 177 172  HDL 42 40 42  LDLCALC 113* 107* 105*  TRIG 135 182* 141  CHOLHDL 4.3 4.4 4.1   Lab Results  Component Value Date   HGBA1C 5.4 11/25/2019    Assessment/Plan 1. Essential hypertension -bp well controlled on current regimen, cont same as tolerated well  2. Other hyperlipidemia -LDL remains above goal -not tolerating crestor weekly now due to increased cramping of extremities as well as abdomen now -historically had myopathy with evidence on labs with lipitor -I will see if cardiology can get him set up with praluent or repatha--he is willing to learn to given  himself shots for this if needed -encouraged continued dietary improvements with decreased sweets and encouraged exercise program--gradual increase of walking on flat surface--currently at 1/2 mile so recommended focusing on flat surface and adding a 1/10 of a mile gradually to his regimen -if hips flare up, return to Dr. Alvan Dame for injections  3. Morbid obesity ([place]) -continue to work on diet, exercise -he has  lost 7 lbs -reports that if he cannot keep up this trend, he is willing to go to healthy weight and wellness as I've recommended for him (needs to get weight off joints and spine)  4. Hyperglycemia -sugar in normal range at present with decreased sweets Lab Results  Component Value Date   HGBA1C 5.4 11/25/2019   5. Bilateral ocular hypertension -had laser procedure on right eye with improved symptoms -cont drops  6. Esophageal stenosis -continues to struggle some here, but is holding off on endoscopy due to covid though he has been vaccinated now  7. Trochanteric bursitis of both hips -recommended he pursue injections to keep moving if needed  Labs/tests ordered:   Lab Orders     Lipid panel     Hemoglobin A1c     BASIC METABOLIC PANEL WITH GFR  Next appt:  Med mgt, faSting labs before   Tilly Pernice L. Tami Barren, D.O. [facility] [address], [place] [address] Cell Phone (Mon-Fri 8am-5pm):  [phone] On Call:  [phone] & follow prompts after 5pm & weekends Office Phone:  [phone] Office Fax:  [phone]

## 2019-12-02 DIAGNOSIS — R0600 Dyspnea, unspecified: Secondary | ICD-10-CM | POA: Diagnosis not present

## 2019-12-02 DIAGNOSIS — I2510 Atherosclerotic heart disease of native coronary artery without angina pectoris: Secondary | ICD-10-CM | POA: Diagnosis not present

## 2019-12-02 DIAGNOSIS — I519 Heart disease, unspecified: Secondary | ICD-10-CM | POA: Diagnosis not present

## 2019-12-02 DIAGNOSIS — E785 Hyperlipidemia, unspecified: Secondary | ICD-10-CM | POA: Diagnosis not present

## 2019-12-02 DIAGNOSIS — I10 Essential (primary) hypertension: Secondary | ICD-10-CM | POA: Diagnosis not present

## 2019-12-02 LAB — COMPREHENSIVE METABOLIC PANEL
ALT: 19 IU/L (ref 0–44)
AST: 17 IU/L (ref 0–40)
Albumin/Globulin Ratio: 2.9 — ABNORMAL HIGH (ref 1.2–2.2)
Albumin: 4.1 g/dL (ref 3.8–4.8)
Alkaline Phosphatase: 98 IU/L (ref 39–117)
BUN/Creatinine Ratio: 15 (ref 10–24)
BUN: 14 mg/dL (ref 8–27)
Bilirubin Total: 0.3 mg/dL (ref 0.0–1.2)
CO2: 23 mmol/L (ref 20–29)
Calcium: 9.1 mg/dL (ref 8.6–10.2)
Chloride: 106 mmol/L (ref 96–106)
Creatinine, Ser: 0.95 mg/dL (ref 0.76–1.27)
GFR calc Af Amer: 93 mL/min/{1.73_m2} (ref >59)
GFR calc non Af Amer: 81 mL/min/{1.73_m2} (ref >59)
Globulin, Total: 1.4 g/dL — ABNORMAL LOW (ref 1.5–4.5)
Glucose: 98 mg/dL (ref 65–99)
Potassium: 4.6 mmol/L (ref 3.5–5.2)
Sodium: 142 mmol/L (ref 134–144)
Total Protein: 5.5 g/dL — ABNORMAL LOW (ref 6.0–8.5)

## 2019-12-02 LAB — LIPID PANEL
Chol/HDL Ratio: 4.2 ratio (ref 0.0–5.0)
Cholesterol, Total: 173 mg/dL (ref 100–199)
HDL: 41 mg/dL (ref >39)
LDL Chol Calc (NIH): 107 mg/dL — ABNORMAL HIGH (ref 0–99)
Triglycerides: 139 mg/dL (ref 0–149)
VLDL Cholesterol Cal: 25 mg/dL (ref 5–40)

## 2019-12-19 DIAGNOSIS — M65321 Trigger finger, right index finger: Secondary | ICD-10-CM | POA: Diagnosis not present

## 2020-01-07 DIAGNOSIS — H40053 Ocular hypertension, bilateral: Secondary | ICD-10-CM | POA: Diagnosis not present

## 2020-01-14 DIAGNOSIS — M65321 Trigger finger, right index finger: Secondary | ICD-10-CM | POA: Diagnosis not present

## 2020-01-22 ENCOUNTER — Other Ambulatory Visit: Payer: Self-pay

## 2020-01-22 ENCOUNTER — Ambulatory Visit: Payer: PPO | Admitting: Cardiovascular Disease

## 2020-01-22 ENCOUNTER — Telehealth: Payer: Self-pay

## 2020-01-22 ENCOUNTER — Encounter: Payer: Self-pay | Admitting: Cardiovascular Disease

## 2020-01-22 VITALS — BP 134/74 | HR 77 | Ht 70.0 in | Wt 279.6 lb

## 2020-01-22 DIAGNOSIS — G4733 Obstructive sleep apnea (adult) (pediatric): Secondary | ICD-10-CM | POA: Diagnosis not present

## 2020-01-22 DIAGNOSIS — R0609 Other forms of dyspnea: Secondary | ICD-10-CM

## 2020-01-22 DIAGNOSIS — Z79899 Other long term (current) drug therapy: Secondary | ICD-10-CM

## 2020-01-22 DIAGNOSIS — I519 Heart disease, unspecified: Secondary | ICD-10-CM

## 2020-01-22 DIAGNOSIS — R0600 Dyspnea, unspecified: Secondary | ICD-10-CM

## 2020-01-22 DIAGNOSIS — I10 Essential (primary) hypertension: Secondary | ICD-10-CM

## 2020-01-22 DIAGNOSIS — I5189 Other ill-defined heart diseases: Secondary | ICD-10-CM

## 2020-01-22 DIAGNOSIS — R079 Chest pain, unspecified: Secondary | ICD-10-CM

## 2020-01-22 DIAGNOSIS — E785 Hyperlipidemia, unspecified: Secondary | ICD-10-CM

## 2020-01-22 MED ORDER — BEMPEDOIC ACID-EZETIMIBE 180-10 MG PO TABS: ORAL_TABLET | ORAL | 3 refills | Status: DC

## 2020-01-22 NOTE — Progress Notes (Signed)
Patient ID: Anthony Isa., male   DOB: 1950-10-04, 69 y.o.   MRN: [id number]     HPI: Anthony [name]. is a 69 y.o. male who presents to the office for a 4 month  follow-up cardiology evaluation.   Anthony [name] has a history of hypertension, mild obesity, as well as hyperlipidemia. In the past, he did develop CPK elevation secondary to statin therapy. He has been able to tolerate Zetia. A nuclear perfusion study done in October 2013 for chest pain showed normal perfusion without scar or ischemia.   Laboratory in October 2014 showed cholesterol 196, triglycerides significantly elevated at 291, HDL of 34, VLDL increased at 58, and  LDL 106. Renal function was normal. BUN 15 kerning 0.79.   He developed recurrent episodes of chest pain for which he saw [name] and because of exertionally precipitated episodes of discomfort definitive cardiac catheterization was recommended. This was done by me on 09/05/2013 and revealed normal coronary arteries with normal LV function without focal segmental wall motion abnormalities. Ejection fraction was 55%.  He underwent an echo Doppler study on 09/04/2013 which showed an ejection fraction of 55-60%. He did have grade 1 diastolic dysfunction and mild left ventricular hypertrophy. He had mild mitral annular calcification with mildly thickened leaflets and trivial mitral regurgitation. A moderate pressure estimate was 27 mm.  He was  seen by [name] on 08/17/2016 with complaints of chest discomfort.  He's episodes would occur often when he was walking on his driveway her in the grocery store and had been ongoing for 1-2 months.  He had diffuse tenderness to palpation along his left pectoral region.  On exam it was felt that some of his chest pain was musculoskeletal in etiology.  He was hypertensive and she further titrated amlodipine.  Because of exertional dyspnea.  She referred him for an echo Doppler study which was done on 08/23/2016.  This showed  mild LVH with normal systolic function and grade 1 diastolic dysfunction.  There is very mild increased peak PA pressure 34 mm.  I saw him in January 2019 after not having seen him since February 2015.  At that time, his pressure was elevated despite taking amlodipine 7.5 mg.  I recommended further titration to 10 mg daily.  On this increased dose, he states his blood pressure at home typically has been running in the 130-135 range.  He denies chest pain PND orthopnea.  He has a history of GERD which is controlled with Nexium.  He has had some issues with left arm paresthesias and was diagnosed with significant cervical degenerative disc disease.  He is scheduled to undergo neck surgery on December 12, 2017 by Dr. Trenton Gammon involving C4-5 and C5-6.  Preoperative surgical clearance was recommended.  He denies any chest pain or shortness of breath.  He has not been exercising as he had in the past in the winter months but hopes to do so following his surgery and with improved weather.    When I saw him in April 2019 he was given surgical clearance for his cervical surgery.  He underwent successful surgery by Dr. Trenton Gammon in July 2019 involving C4-5 and C5-6 and tolerated this well.   I last saw him in January 2020 at that time he had begun to notice some episodes of chest wall discomfort.  He denied any clear-cut exertional precipitation and noted the chest discomfort intermittently.  He was unable to walk well secondary to bilateral knee and hip replacement  surgery.  He also had experience of mild exertional shortness of breath.  I felt most likely that his chest pain was of musculoskeletal etiology.  However with his exertional dyspnea I recommended reevaluation of his coronary anatomy and suggested CT coronary angiography and also a 2-year follow-up echo Doppler assessment.  His echo study was done on August 30, 2018 and showed an EF of 60 to 65% without wall motion abnormalities.  There was grade 2 diastolic  dysfunction.  There was mild increased PA pressure at 35 mm.  There was mild MR.  Coronary CTA yielded calcium score of 26.4 which was 20th percentile for age and sex matched control.  He had normal coronary origin with right dominance.  There was minimal plaque in the ostial LAD and OM1.  CT demonstrated atherosclerotic disease in the descending thoracic aorta.  I saw him on March 04, 2019 at which time he denied any chest pain, palpitations or dyspnea.  He was experiencing bilateral leg swelling despite taking HCTZ.   During this COVID pandemic, he has gained approximately 15 pounds.  He has not been exercising due to hip discomfort.  Laboratory in March 2020 showed a total cholesterol 173, triglycerides 143 HDL 46 and LDL 103.  During that evaluation I recommended he discontinue hydrochlorothiazide and in its place start furosemide 40 mg for 2 days and then 20 mg daily.  I saw him in follow-up on September 03, 2019.  Prior to that evaluation he was  on amlodipine 10 mg, benazepril 40 mg, furosemide 20 mg daily.  At times he does experience shortness of breath and notes swelling right leg greater than left.  He has been able to take Zetia 10 mg in addition to very low-dose rosuvastatin 5 mg once a week and is tolerating this well.  Recently he has noticed slight increase in the shortness of breath.  He admits to further weight gain.  During that evaluation with his shortness of breath spironolactone 12.5 mg was added to his medical regimen both from previous improved blood pressure control as well as potential improvement in diastolic dysfunction contributing to his exertional dyspnea.  Laboratory 1 month ago showed total cholesterol 177, HDL 40 triglycerides 182 LDL 107.  BUN 13 creatinine 0.96.  I saw him in follow-up on October 07, 2019.  Repeat chemistry profile showed a potassium of 4.5.  BUN 13 and creatinine 1.06.  Over the prior month, he has had purposeful weight loss contributed by reduction in his  meal servings.  He did receive his Covid vaccination.  He did experience some nonexertional shoulder chest soreness following the vaccination.  He denies any exertional chest pain symptomatology.  During his most recent evaluation blood pressure was improved but was still 138/78 and I recommended slight additional titration of spironolactone to 25 mg daily.  Over the last 4 months he has been experiencing tightness in his chest for the past 3 to 4 weeks.  The symptoms are not always exertional.  He admits to decreased energy.  He has been having difficulty with reflux.  He also experiences some sharp pain discomfort going to his back which is nonexertional.  He has been on amlodipine 10 mg, benazepril 40 mg, furosemide 20 mg daily and spironolactone 25 mg daily for his blood pressure and shortness of breath.  He apparently is no longer taking low-dose Crestor and has continued to be on Zetia.  Lipid studies on December 02, 2019 showed an LDL cholesterol that had risen to 107.  Total cholesterol was 173 triglycerides 139.  He sees Dr. Cristina Gong who may want to do esophageal dilatation in the future.  He admits to fatigability, no energy, nocturia at least 3-4 times per night as well as the need to take a nap every day.  He presents for evaluation.  Past Medical History:  Diagnosis Date  . Benign neoplasm of colon   . Benign neoplasm of skin of upper limb, including shoulder   . Carpal tunnel syndrome   . Chest pain    a. normal cors by cath in 2015  . Diaphragmatic hernia without mention of obstruction or gangrene   . Dyslipidemia   . Elevated prostate specific antigen (PSA)   . GERD (gastroesophageal reflux disease)   . Herpes zoster without mention of complication   . History of hiatal hernia   . Hypertension   . Hypertrophy of prostate with urinary obstruction and other lower urinary tract symptoms (LUTS)   . Impotence of organic origin   . Lumbago   . Obesity, unspecified   . Osteoarthrosis,  unspecified whether generalized or localized, lower leg   . Other and unspecified hyperlipidemia   . Other malaise and fatigue   . Other seborrheic keratosis   . Special screening for malignant neoplasm of prostate   . Stenosis, cervical spine   . Unspecified tinnitus     Past Surgical History:  Procedure Laterality Date  . ANTERIOR CERVICAL DECOMP/DISCECTOMY FUSION N/A 02/19/2018   Procedure: ANTERIOR CERVICAL DECOMPRESSION/DISCECTOMY FUSION - CERVICAL FOUR-CERVICAL FIVE - CERVICAL FIVE-CERVICAL SIX;  Surgeon: Earnie Larsson, MD;  Location: [facility];  Service: Neurosurgery;  Laterality: N/A;  . CARDIOVASCULAR STRESS TEST  06/15/2012   Non-diagnostic for ischemia. No lexiscan EKG changes.  . CHOLECYSTECTOMY  1992  . EYE SURGERY    . eyeband  2006   removal of right eyeband  . INGUINAL HERNIA REPAIR  1984   left  . INTRAOCULAR LENS REMOVAL  2008   right  . LEFT HEART CATHETERIZATION WITH CORONARY ANGIOGRAM N/A 09/05/2013   Procedure: LEFT HEART CATHETERIZATION WITH CORONARY ANGIOGRAM;  Surgeon: Troy Sine, MD;  Location: [facility] CATH LAB;  Service: Cardiovascular;  Laterality: N/A;  . LOWER VENOUS EXTREMITY DOPPLER  01/26/2011   No evidence of thrombus or thrombophlebitis.  Marland Kitchen RETINAL DETACHMENT SURGERY  2001   right  . TOTAL HIP ARTHROPLASTY  2012   bilateral  . TOTAL KNEE ARTHROPLASTY  2011, 2012   bilateral  . TRANSTHORACIC ECHOCARDIOGRAM  10/05/2009   EF >55%, normal LV size, systolic, and diastolic function.    Allergies  Allergen Reactions  . Lipitor [Atorvastatin] Other (See Comments)    Leg pain/cramps; statin myopathy  . Red Yeast Rice [Cholestin] Other (See Comments)    Makes legs hurt, myopathy  . Statins Other (See Comments)    "muscle break down", statin myopathy  . Codeine Other (See Comments)    Headache  . Latex Rash    Current Outpatient Medications  Medication Sig Dispense Refill  . amLODipine (NORVASC) 10 MG tablet TAKE 1 TABLET BY MOUTH EVERY DAY IN THE  MORNING 90 tablet 1  . aspirin EC 81 MG tablet Take 81 mg by mouth every morning.    . benazepril (LOTENSIN) 40 MG tablet TAKE 1 TABLET BY MOUTH EVERY DAY IN THE MORNING 90 tablet 1  . dorzolamide-timolol (COSOPT) 22.3-6.8 MG/ML ophthalmic solution Place 2 drops into the right eye 2 (two) times daily.     Marland Kitchen esomeprazole (NEXIUM) 40 MG capsule  TAKE 1 CAPSULE BY MOUTH AT BEDTIME. FOR REFLUX 90 capsule 1  . ezetimibe (ZETIA) 10 MG tablet TAKE 1 TABLET BY MOUTH EVERY DAY 90 tablet 1  . finasteride (PROSCAR) 5 MG tablet TAKE 1 TABLET BY MOUTH EVERY DAY IN THE MORNING 90 tablet 1  . furosemide (LASIX) 40 MG tablet Take 1 tablet by mouth for 2 days, then 1/2 tablet (20 mg) daily 90 tablet 3  . meloxicam (MOBIC) 15 MG tablet Take 15 mg by mouth daily.    . Polyvinyl Alcohol-Povidone (REFRESH OP) Place 1 drop into the right eye daily as needed (dry eyes/irritation).    Marland Kitchen spironolactone (ALDACTONE) 25 MG tablet Take 1 tablet (25 mg total) by mouth daily. Take 53m daily 90 tablet 3  . Bempedoic Acid-Ezetimibe 180-10 MG TABS TAKE 1 TABLET DAILY 90 tablet 3  . brimonidine (ALPHAGAN P) 0.1 % SOLN Place 1 drop into the right eye 2 (two) times daily.    . brimonidine (ALPHAGAN) 0.2 % ophthalmic solution Place 1 drop into the right eye 2 (two) times daily.     No current facility-administered medications for this visit.    Social History   Socioeconomic History  . Marital status: Married    Spouse name: Not on file  . Number of children: Not on file  . Years of education: Not on file  . Highest education level: Not on file  Occupational History  . 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 Smoking status: Never Smoker  . Smokeless tobacco: Never Used  Substance and Sexual Activity  . Alcohol use: No  . Drug use: No  . Sexual activity: Yes    Partners: Female    Comment: wife  Other Topics Concern  . Not on file  Social History Narrative   Married   Never  smoked   Alcohol none   Exercise -gym 3 days a week (join 3 months ago)   Living Will   Social Determinants of Health   Financial Resource Strain:   . Difficulty of Paying Living Expenses:   Food Insecurity:   . Worried About RCharity fundraiserin the Last Year:   . RArboriculturistin the Last Year:   Transportation Needs:   . LFilm/video editor(Medical):   .Marland KitchenLack of Transportation (Non-Medical):   Physical Activity:   . Days of Exercise per Week:   . Minutes of Exercise per Session:   Stress:   . Feeling of Stress :   Social Connections:   . Frequency of Communication with Friends and Family:   . Frequency of Social Gatherings with Friends and Family:   . Attends Religious Services:   . Active Member of Clubs or Organizations:   . Attends CArchivistMeetings:   .Marland KitchenMarital Status:   Intimate Partner Violence:   . Fear of Current or Ex-Partner:   . Emotionally Abused:   .Marland KitchenPhysically Abused:   . Sexually Abused:    Social is notable that he is married has 2 children and 2 grandchildren. He is not routinely exercise. There is no tobacco use. He does drink occasional alcohol.  Family History  Problem Relation Age of Onset  . Diabetes Mother   . Arrhythmia Mother   . Hypertension Mother   . Dementia Father   . Pulmonary embolism Father   . Hypertension Father   . Cancer Sister  Liver cancer - Histocytic lymphoma  . Cancer Sister 39       Skin cancer  . Hypertension Sister 34  . Diabetes Sister   . Stroke Brother   . Diabetes Brother   . Hypertension Brother   . Cancer Daughter   . Cancer Maternal Grandmother        Skin cancer  . Heart attack Maternal Grandfather   . Cancer Paternal Grandfather   . Hypertension Paternal Grandfather     ROS General: Negative; No fevers, chills, or night sweats;  positive for a 16 pound weight loss in September 2018.  Positive for fatigue HEENT: Negative; No changes in vision or hearing, sinus congestion,  difficulty swallowing Pulmonary: Negative; No cough, wheezing, shortness of breath, hemoptysis Cardiovascular: Positive for occasional exertional shortness of breath.  No exertional chest pain.  Positive for right leg greater than left swelling GI: Positive for GERD  GU: Positive for nocturia 3-4 times per night Musculoskeletal: Lateral hip and knee discomfort; status post recent surgery C4-5/C5-6 Hematologic/Oncology: Negative; no easy bruising, bleeding Endocrine: Negative; no heat/cold intolerance; no diabetes Neuro: Mild left arm paresthesias from his cervical degenerative disease Skin: Negative; No rashes or skin lesions Psychiatric: Negative; No behavioral problems, depression Sleep: Negative; No snoring, daytime sleepiness, hypersomnolence, bruxism, restless legs, hypnogognic hallucinations, no cataplexy Other comprehensive 14 point system review is negative.  PE BP 134/74   Pulse 77   Ht '5\' 10"'  (1.778 m)   Wt 279 lb 9.6 oz (126.8 kg)   SpO2 96%   BMI 40.12 kg/m    Repeat blood pressure was 140/78  Wt Readings from Last 3 Encounters:  01/22/20 279 lb 9.6 oz (126.8 kg)  11/28/19 273 lb 8 oz (124.1 kg)  10/07/19 280 lb 6.4 oz (127.2 kg)   General: Alert, oriented, no distress.  Skin: normal turgor, no rashes, warm and dry HEENT: Normocephalic, atraumatic. Pupils equal round and reactive to light; sclera anicteric; extraocular muscles intact;  Nose without nasal septal hypertrophy Mouth/Parynx benign; Mallinpatti scale 3/4 Neck: No JVD, no carotid bruits; normal carotid upstroke Lungs: clear to ausculatation and percussion; no wheezing or rales Chest wall: without tenderness to palpitation Heart: PMI not displaced, RRR, s1 s2 normal, 1/6 systolic murmur, no diastolic murmur, no rubs, gallops, thrills, or heaves Abdomen: Central adiposity soft, nontender; no hepatosplenomehaly, BS+; abdominal aorta nontender and not dilated by palpation. Back: no CVA tenderness Pulses  2+ Musculoskeletal: full range of motion, normal strength, no joint deformities Extremities: no clubbing cyanosis or edema, Homan's sign negative  Neurologic: grossly nonfocal; Cranial nerves grossly wnl Psychologic: Normal mood and affect   ECG (independently read by me): NSR at 73;  No ectopy, no ST changes, normal intervals  January 12,2021 ECG (independently read by me): Normal sinus rhythm at 81 bpm.  No ectopy.  Normal intervals.  July 2020 ECG (independently read by me): NSR at 70  January 2020 ECG (independently read by me): Normal sinus rhythm at 74 bpm.  No ectopy.  No ST segment changes.  April 2019 ECG (independently read by me): Normal sinus rhythm at 66 bpm.  Normal intervals.  No ectopy.  No ST segment changes.  August 28, 2017 ECG (independently read by me): Normal sinus rhythm at 63 bpm.  Normal intervals.  No ST segment changes.  February 2015 ECG (independently read by me): Normal sinus rhythm at 66 beats per minute. PR interval 160 ms. QTC intervals 34 ms. No significant ST-T changes.  November 2014 ECG (independently  read by me)::Normal sinus rhythm at 69 beats per minute. No ectopy. Normal intervals.  LABS:  BMP Latest Ref Rng & Units 12/02/2019 11/25/2019 09/17/2019  Glucose 65 - 99 mg/dL 98 107(H) 84  BUN 8 - 27 mg/dL '14 20 13  ' Creatinine 0.76 - 1.27 mg/dL 0.95 0.96 1.06  BUN/Creat Ratio 10 - 24 15 NOT APPLICABLE 12  Sodium 867 - 144 mmol/L 142 143 142  Potassium 3.5 - 5.2 mmol/L 4.6 4.1 4.5  Chloride 96 - 106 mmol/L 106 108 103  CO2 20 - 29 mmol/L '23 28 22  ' Calcium 8.6 - 10.2 mg/dL 9.1 9.5 9.9   CBC Latest Ref Rng & Units 11/25/2019 07/22/2019 02/19/2018  WBC 3.8 - 10.8 Thousand/uL 7.8 7.0 6.7  Hemoglobin 13.2 - 17.1 g/dL 16.1 15.9 14.6  Hematocrit 38.5 - 50.0 % 47.5 47.1 43.0  Platelets 140 - 400 Thousand/uL 220 205 188   Lab Results  Component Value Date   TSH 3.27 11/25/2019    Lab Results  Component Value Date   HGBA1C 5.4 11/25/2019   Hepatic  Function Latest Ref Rng & Units 12/02/2019 11/25/2019 07/22/2019  Total Protein 6.0 - 8.5 g/dL 5.5(L) 5.9(L) 5.6(L)  Albumin 3.8 - 4.8 g/dL 4.1 - -  AST 0 - 40 IU/L '17 14 15  ' ALT 0 - 44 IU/L '19 19 21  ' Alk Phosphatase 39 - 117 IU/L 98 - -  Total Bilirubin 0.0 - 1.2 mg/dL 0.3 0.5 0.6   Lipid Panel     Component Value Date/Time   CHOL 173 12/02/2019 0844   CHOL 191 07/22/2013 0835   TRIG 139 12/02/2019 0844   TRIG 253 (H) 07/22/2013 0835   HDL 41 12/02/2019 0844   HDL 38 (L) 07/22/2013 0835   CHOLHDL 4.2 12/02/2019 0844   CHOLHDL 4.1 11/25/2019 0804   VLDL 35 (H) 10/27/2016 0825   LDLCALC 107 (H) 12/02/2019 0844   LDLCALC 105 (H) 11/25/2019 0804   LDLCALC 102 (H) 07/22/2013 6720    RADIOLOGY: No results found.  IMPRESSION:  1. Essential hypertension   2. Hyperlipidemia with target LDL less than 70   3. Dyspnea on exertion   4. Chest pain, unspecified type   5. Evaluate for OSA (obstructive sleep apnea)   6. Grade II diastolic dysfunction   7. Morbid obesity ([place])   8. Medication management     ASSESSMENT AND PLAN: Anthony [name] is a 69 year-old gentleman who has a history of obesity, hyperlipidemia, hypertension, and GERD.  He had developed chest discomfort 2015 which ultimately led to definitive cardiac catheterization which revealed normal coronary arteries.  He  underwent successful surgery by Dr. Trenton Gammon involving C4-5 and C5-6 and tolerated this well from a cardiac standpoint.  He had developed some episodes of chest pain which most likely were of musculoskeletal etiology.  A CTA in 2020 showed minimal coronary plaque involving his LAD ostium and OM1 vessel.  He also was noted to have atherosclerosis of his thoracic aorta.  Most recently, he has been on a medical regimen consisting of amlodipine 10 mg, benazepril 40 mg, furosemide 20 mg and spironolactone 25 mg twice a day for blood pressure.  He has a history of hyperlipidemia and remotely had developed CPK elevation secondary to  high dose statin therapy.  He has been able to tolerate Zetia and recently was started on very low-dose rosuvastatin 5 mg but ultimately this has been discontinued.  His LDL cholesterol has increased to 107.  I discussed with him  initiation of bempedoic acid.  Since he already is on Zetia, I have given him samples of Nexlizet 180/10 which is combination bempedoic acid plus Zetia and 1 pill.  He has experienced some vague nonexertional chest pain.  He is bothered by bilateral hip discomfort and is status post replacement of both hips as well as knees.  He has experienced atypical sharp chest pain.  He is obese, has a thick neck, and has symptom complex suggestive of obstructive sleep apnea contributing to his nocturia, daytime sleepiness, snoring, and fatigability.  I am recommending he undergo an exercise nuclear perfusion study.  He has previously documented normal LV systolic function with grade 2 diastolic dysfunction.  His weight has increased and is now morbidly obese.  I discussed the importance of weight loss and exercise.  In 8 weeks repeat laboratory will be obtained.  I will see him in 2 to 3 months for reevaluation or sooner as necessary.   Troy Sine, MD, [facility]  01/23/2020 5:21 PM

## 2020-01-22 NOTE — Telephone Encounter (Signed)
Left detailed message per dpr on pts home voicemail. Notified that per [facility], okay to send in a prescription for the nexlizet that was prescribed at today's office visit since the samples given would not last the pt until his follow up labs in 8 weeks.  Advised to call our office with any questions or concerns.

## 2020-01-22 NOTE — Patient Instructions (Signed)
Medication Instructions:  STOP TAKING YOUR ZETIA  BEGIN TAKING NEXLIZET 180/10MG  DAILY  *If you need a refill on your cardiac medications before your next appointment, please call your pharmacy*   Lab Work: FASTING LABS IN 8 WEEKS: LIPID CMET  If you have labs (blood work) drawn today and your tests are completely normal, you will receive your results only by: Marland Kitchen MyChart Message (if you have MyChart) OR . A paper copy in the mail If you have any lab test that is abnormal or we need to change your treatment, we will call you to review the results.   Testing/Procedures: Your physician has recommended that you have a sleep study. This test records several body functions during sleep, including: brain activity, eye movement, oxygen and carbon dioxide blood levels, heart rate and rhythm, breathing rate and rhythm, the flow of air through your mouth and nose, snoring, body muscle movements, and chest and belly movement.  OUR SLEEP COORDINATOR WILL CALL TO SCHEDULE THIS   Follow-Up: At [facility], you and your health needs are our priority.  As part of our continuing mission to provide you with exceptional heart care, we have created designated Provider Care Teams.  These Care Teams include your primary Cardiologist (physician) and Advanced Practice Providers (APPs -  Physician Assistants and Nurse Practitioners) who all work together to provide you with the care you need, when you need it.  We recommend signing up for the patient portal called "MyChart".  Sign up information is provided on this After Visit Summary.  MyChart is used to connect with patients for Virtual Visits (Telemedicine).  Patients are able to view lab/test results, encounter notes, upcoming appointments, etc.  Non-urgent messages can be sent to your provider as well.   To learn more about what you can do with MyChart, go to [email or web address].    Your next appointment:   3 month(s)  The format for your next  appointment:   In Person  Provider:   Shelva Majestic, MD

## 2020-01-23 ENCOUNTER — Encounter: Payer: Self-pay | Admitting: Cardiovascular Disease

## 2020-01-29 ENCOUNTER — Other Ambulatory Visit: Payer: Self-pay | Admitting: Cardiovascular Disease

## 2020-01-29 DIAGNOSIS — I10 Essential (primary) hypertension: Secondary | ICD-10-CM

## 2020-02-06 ENCOUNTER — Telehealth: Payer: Self-pay | Admitting: Internal Medicine

## 2020-02-06 DIAGNOSIS — E7849 Other hyperlipidemia: Secondary | ICD-10-CM

## 2020-02-06 NOTE — Telephone Encounter (Signed)
Received fax from pharmacy Pended Rx's and sent to Dr. Mariea Clonts for approval due to [place].

## 2020-02-07 ENCOUNTER — Telehealth: Payer: Self-pay | Admitting: Cardiovascular Disease

## 2020-02-07 NOTE — Telephone Encounter (Signed)
Called and spoke with pt, notified that Dr.Reed changed his medication yesterday and that he needed to call their office to discuss. Pt verbalized understanding. Pt asking if we received his mychart message regarding his cholesterol medications.   Notified I sent it to Dr.Kelly who has been rounding in the hospital all week and we would get back with him as soon as possible. Pt had no other questions at this time.

## 2020-02-07 NOTE — Telephone Encounter (Signed)
New message  Pt c/o medication issue:  1. Name of Medication: Benazepril HLC  2. How are you currently taking this medication (dosage and times per day)? n/a  3. Are you having a reaction (difficulty breathing--STAT)? n/a  4. What is your medication issue? Patient's wife states that patient was given this medication at the pharmacy and patient would like to go over medication and wants to know why it was changed and if it was supposed to be. Please give patient a call back.

## 2020-02-09 ENCOUNTER — Encounter: Payer: Self-pay | Admitting: Internal Medicine

## 2020-02-10 ENCOUNTER — Other Ambulatory Visit: Payer: Self-pay | Admitting: *Deleted

## 2020-02-10 MED ORDER — BENAZEPRIL HCL 40 MG PO TABS: 40.0000 mg | ORAL_TABLET | Freq: Every day | ORAL | 1 refills | Status: DC

## 2020-02-10 NOTE — Telephone Encounter (Signed)
I do not understand what happened here--I apologize to the patient for his inconvenience.  Thank you for sending in the 40mg  benazepril dose now.

## 2020-02-10 NOTE — Telephone Encounter (Signed)
The Benazepril was pended and sent to you on 02/06/2020 due to a HIGH ALERT Warning You discontinued the Benazepril and prescribed the Enalapril instead.        02/06/2020:  Medication Renewals and Changes    Enalapril Maleate 5 mg Oral Daily     (Reorder)   Finasteride 5 MG TAKE 1 TABLET BY MOUTH EVERY DAY IN THE MORNING   Ezetimibe 10 MG TAKE 1 TABLET BY MOUTH EVERY DAY

## 2020-02-10 NOTE — Telephone Encounter (Signed)
° °    9:36 AM Note The Benazepril was pended and sent to you on 02/06/2020 due to a HIGH ALERT Warning You discontinued the Benazepril and prescribed the Enalapril instead.        02/06/2020:  Medication Renewals and Changes    Enalapril Maleate 5 mg Oral Daily     (Reorder)   Finasteride 5 MG TAKE 1 TABLET BY MOUTH EVERY DAY IN THE MORNING   Ezetimibe 10 MG TAKE 1 TABLET BY MOUTH EVERY DAY

## 2020-02-10 NOTE — Telephone Encounter (Signed)
Per Dr. Reed---Ok to change back to Anthony [name] mg once daily NOT Enalapril.   Patient notified and agreed and wants to continue the medication he has been taking.  Pended Rx and sent to Dr. Mariea Clonts for approval.

## 2020-02-10 NOTE — Telephone Encounter (Signed)
Per Dr. Reed-not sure how the medication got changed. Ok to go back on Benazepril 40mg  one daily.   Patient notified and agreed. Pended Medication and sent to Dr. Mariea Clonts for approval.

## 2020-02-11 DIAGNOSIS — M65321 Trigger finger, right index finger: Secondary | ICD-10-CM | POA: Diagnosis not present

## 2020-02-11 DIAGNOSIS — M19042 Primary osteoarthritis, left hand: Secondary | ICD-10-CM | POA: Diagnosis not present

## 2020-02-12 ENCOUNTER — Other Ambulatory Visit: Payer: Self-pay | Admitting: Cardiovascular Disease

## 2020-03-25 ENCOUNTER — Other Ambulatory Visit: Payer: Self-pay | Admitting: Internal Medicine

## 2020-03-27 ENCOUNTER — Other Ambulatory Visit: Payer: Self-pay

## 2020-03-27 DIAGNOSIS — E7849 Other hyperlipidemia: Secondary | ICD-10-CM

## 2020-03-27 DIAGNOSIS — I10 Essential (primary) hypertension: Secondary | ICD-10-CM

## 2020-03-27 DIAGNOSIS — R739 Hyperglycemia, unspecified: Secondary | ICD-10-CM

## 2020-03-30 ENCOUNTER — Other Ambulatory Visit: Payer: PPO

## 2020-03-30 DIAGNOSIS — R739 Hyperglycemia, unspecified: Secondary | ICD-10-CM | POA: Diagnosis not present

## 2020-03-30 DIAGNOSIS — I10 Essential (primary) hypertension: Secondary | ICD-10-CM | POA: Diagnosis not present

## 2020-03-30 DIAGNOSIS — E7849 Other hyperlipidemia: Secondary | ICD-10-CM | POA: Diagnosis not present

## 2020-03-31 LAB — BASIC METABOLIC PANEL WITH GFR
BUN: 17 mg/dL (ref 7–25)
CO2: 26 mmol/L (ref 20–32)
Calcium: 9.2 mg/dL (ref 8.6–10.3)
Chloride: 108 mmol/L (ref 98–110)
Creat: 1.01 mg/dL (ref 0.70–1.18)
GFR, Est African American: 86 mL/min/{1.73_m2} (ref >=60)
GFR, Est Non African American: 74 mL/min/{1.73_m2} (ref >=60)
Glucose, Bld: 105 mg/dL — ABNORMAL HIGH (ref 65–99)
Potassium: 4.3 mmol/L (ref 3.5–5.3)
Sodium: 141 mmol/L (ref 135–146)

## 2020-03-31 LAB — LIPID PANEL
Cholesterol: 176 mg/dL (ref <200)
HDL: 39 mg/dL — ABNORMAL LOW (ref >=40)
LDL Cholesterol (Calc): 107 mg/dL (calc) — ABNORMAL HIGH
Non-HDL Cholesterol (Calc): 137 mg/dL (calc) — ABNORMAL HIGH (ref <130)
Total CHOL/HDL Ratio: 4.5 (calc) (ref <5.0)
Triglycerides: 178 mg/dL — ABNORMAL HIGH (ref <150)

## 2020-03-31 LAB — HEMOGLOBIN A1C
Hgb A1c MFr Bld: 5.3 % of total Hgb (ref <5.7)
Mean Plasma Glucose: 105 (calc)
eAG (mmol/L): 5.8 (calc)

## 2020-03-31 NOTE — Progress Notes (Signed)
Kidneys and electrolytes are ok.   Sugar average was in normal range. Cholesterol has trended up especially triglycerides.

## 2020-04-02 ENCOUNTER — Ambulatory Visit (INDEPENDENT_AMBULATORY_CARE_PROVIDER_SITE_OTHER): Payer: PPO | Admitting: Internal Medicine

## 2020-04-02 ENCOUNTER — Other Ambulatory Visit: Payer: Self-pay

## 2020-04-02 ENCOUNTER — Encounter: Payer: Self-pay | Admitting: Internal Medicine

## 2020-04-02 VITALS — BP 128/78 | HR 72 | Temp 97.5°F | Ht 70.0 in | Wt 268.4 lb

## 2020-04-02 DIAGNOSIS — H40053 Ocular hypertension, bilateral: Secondary | ICD-10-CM | POA: Diagnosis not present

## 2020-04-02 DIAGNOSIS — G720 Drug-induced myopathy: Secondary | ICD-10-CM

## 2020-04-02 DIAGNOSIS — I10 Essential (primary) hypertension: Secondary | ICD-10-CM | POA: Diagnosis not present

## 2020-04-02 DIAGNOSIS — T466X5A Adverse effect of antihyperlipidemic and antiarteriosclerotic drugs, initial encounter: Secondary | ICD-10-CM

## 2020-04-02 DIAGNOSIS — R739 Hyperglycemia, unspecified: Secondary | ICD-10-CM

## 2020-04-02 DIAGNOSIS — E7849 Other hyperlipidemia: Secondary | ICD-10-CM | POA: Diagnosis not present

## 2020-04-02 DIAGNOSIS — M542 Cervicalgia: Secondary | ICD-10-CM

## 2020-04-02 NOTE — Patient Instructions (Signed)
Try voltaren gel or aspercreme over the area of your neck and shoulder that are bothersome.  If your pain persists despite that, heat, then pursue follow-up with Dr. Trenton Gammon.

## 2020-04-02 NOTE — Progress Notes (Signed)
Location:  [facility] clinic Provider:  Pau Banh L. Mariea Clonts, D.O., C.M.D.  Code Status: DNR Goals of Care:  Advanced Directives 04/02/2020  Does Patient Have a Medical Advance Directive? Yes  Type of Advance Directive Out of facility DNR (pink MOST or yellow form)  Does patient want to make changes to medical advance directive? No - Guardian declined  Copy of [place] in Chart? -  Would patient like information on creating a medical advance directive? -  Pre-existing out of facility DNR order (yellow form or pink MOST form) Pink MOST/Yellow Form most recent copy in chart - Physician notified to receive inpatient order   Chief Complaint  Patient presents with  . Medical Management of Chronic Issues    4 month follow up     HPI: Patient is a 69 y.o. male seen today for medical management of chronic diseases.    He's doing "good" other than his neck.  Two days ago, on the left side down his trapezius.  He took a pain pill last night b/c it hurt so bad.  It grabs him if he turns left, only slightly on the right and can turn better that way.  He is to f/u with Dr. Trenton Gammon.    He has not tolerated medication thus far for cholesterol except the zetia.  He'd been given samples of nexlizet that he had to stop due to stomach issues and shoulder and leg pain. 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  We discussed praluent or repatha for him considering his intolerance to all statins and fibrates and now nexlizet.  Suspect genetic component to his cholesterol also.  Seeing [facility] every 6 mos about glaucoma.  No recent problems/changes.  Past Medical History:  Diagnosis Date  . Benign neoplasm of colon   . Benign neoplasm of skin of upper limb, including shoulder   . Carpal tunnel syndrome   . Chest pain    a. normal cors by cath in 2015  . Diaphragmatic hernia without mention of obstruction or gangrene   . Dyslipidemia   .  Elevated prostate specific antigen (PSA)   . GERD (gastroesophageal reflux disease)   . Herpes zoster without mention of complication   . History of hiatal hernia   . Hypertension   . Hypertrophy of prostate with urinary obstruction and other lower urinary tract symptoms (LUTS)   . Impotence of organic origin   . Lumbago   . Obesity, unspecified   . Osteoarthrosis, unspecified whether generalized or localized, lower leg   . Other and unspecified hyperlipidemia   . Other malaise and fatigue   . Other seborrheic keratosis   . Special screening for malignant neoplasm of prostate   . Stenosis, cervical spine   . Unspecified tinnitus     Past Surgical History:  Procedure Laterality Date  . ANTERIOR CERVICAL DECOMP/DISCECTOMY FUSION N/A 02/19/2018   Procedure: ANTERIOR CERVICAL DECOMPRESSION/DISCECTOMY FUSION - CERVICAL FOUR-CERVICAL FIVE - CERVICAL FIVE-CERVICAL SIX;  Surgeon: Earnie Larsson, MD;  Location: [facility];  Service: Neurosurgery;  Laterality: N/A;  . CARDIOVASCULAR STRESS TEST  06/15/2012   Non-diagnostic for ischemia. No lexiscan EKG changes.  . CHOLECYSTECTOMY  1992  . EYE SURGERY    . eyeband  2006   removal of right eyeband  . INGUINAL HERNIA REPAIR  1984   left  . INTRAOCULAR LENS REMOVAL  2008   right  . LEFT HEART CATHETERIZATION  WITH CORONARY ANGIOGRAM N/A 09/05/2013   Procedure: LEFT HEART CATHETERIZATION WITH CORONARY ANGIOGRAM;  Surgeon: Troy Sine, MD;  Location: [facility] CATH LAB;  Service: Cardiovascular;  Laterality: N/A;  . LOWER VENOUS EXTREMITY DOPPLER  01/26/2011   No evidence of thrombus or thrombophlebitis.  Marland Kitchen RETINAL DETACHMENT SURGERY  2001   right  . TOTAL HIP ARTHROPLASTY  2012   bilateral  . TOTAL KNEE ARTHROPLASTY  2011, 2012   bilateral  . TRANSTHORACIC ECHOCARDIOGRAM  10/05/2009   EF >55%, normal LV size, systolic, and diastolic function.    Allergies  Allergen Reactions  . Lipitor [Atorvastatin] Other (See Comments)    Leg pain/cramps; statin  myopathy  . Red Yeast Rice [Cholestin] Other (See Comments)    Makes legs hurt, myopathy  . Statins Other (See Comments)    "muscle break down", statin myopathy  . Codeine Other (See Comments)    Headache  . Latex Rash    Outpatient Encounter Medications as of 04/02/2020  Medication Sig  . amLODipine (NORVASC) 10 MG tablet TAKE 1 TABLET BY MOUTH EVERY DAY IN THE MORNING  . aspirin EC 81 MG tablet Take 81 mg by mouth every morning.  . benazepril (LOTENSIN) 40 MG tablet Take 1 tablet (40 mg total) by mouth daily.  . brimonidine (ALPHAGAN P) 0.1 % SOLN Place 1 drop into the right eye 2 (two) times daily.  . brimonidine (ALPHAGAN) 0.2 % ophthalmic solution Place 1 drop into the right eye 2 (two) times daily.  . dorzolamide-timolol (COSOPT) 22.3-6.8 MG/ML ophthalmic solution Place 2 drops into the right eye 2 (two) times daily.   Marland Kitchen esomeprazole (NEXIUM) 40 MG capsule TAKE 1 CAPSULE BY MOUTH AT BEDTIME. FOR REFLUX  . ezetimibe (ZETIA) 10 MG tablet TAKE 1 TABLET BY MOUTH EVERY DAY  . finasteride (PROSCAR) 5 MG tablet TAKE 1 TABLET BY MOUTH EVERY DAY IN THE MORNING  . furosemide (LASIX) 40 MG tablet Take 0.5 tablets (20 mg total) by mouth daily.  . meloxicam (MOBIC) 15 MG tablet Take 15 mg by mouth daily.  . Polyvinyl Alcohol-Povidone (REFRESH OP) Place 1 drop into the right eye daily as needed (dry eyes/irritation).  Marland Kitchen spironolactone (ALDACTONE) 25 MG tablet Take 1 tablet (25 mg total) by mouth daily. Take 25mg  daily  . [DISCONTINUED] Bempedoic Acid-Ezetimibe 180-10 MG TABS TAKE 1 TABLET DAILY   No facility-administered encounter medications on file as of 04/02/2020.    Review of Systems:  Review of Systems  Constitutional: Negative for chills and fever.  HENT: Positive for hearing loss. Negative for congestion and sore throat.   Eyes: Negative for blurred vision.       Ocular htn  Respiratory: Negative for cough and shortness of breath.   Cardiovascular: Negative for chest pain,  palpitations and leg swelling.       Legs did swell when he was at camp  Gastrointestinal: Negative for abdominal pain, blood in stool, constipation, diarrhea, heartburn, melena, nausea and vomiting.  Genitourinary: Negative for dysuria.  Musculoskeletal: Positive for myalgias and neck pain. Negative for falls.  Skin: Negative for itching and rash.  Neurological: Negative for dizziness and loss of consciousness.  Endo/Heme/Allergies: Does not bruise/bleed easily.  Psychiatric/Behavioral: Negative for depression and memory loss. The patient is not nervous/anxious.     Health Maintenance  Topic Date Due  . INFLUENZA VACCINE  03/22/2020  . TETANUS/TDAP  08/29/2021  . COLONOSCOPY  05/08/2022  . COVID-19 Vaccine  Completed  . Hepatitis C Screening  Discontinued  .  PNA vac Low Risk Adult  Discontinued    Physical Exam: Vitals:   04/02/20 0736  BP: 128/78  Pulse: 72  Temp: (!) 97.5 F (36.4 C)  TempSrc: Temporal  SpO2: 97%  Weight: 268 lb 6.4 oz (121.7 kg)  Height: 5\' 10"  (1.778 m)   Body mass index is 38.51 kg/m. Physical Exam Vitals reviewed.  Constitutional:      General: He is not in acute distress.    Appearance: Normal appearance. He is not toxic-appearing.  HENT:     Head: Normocephalic and atraumatic.  Cardiovascular:     Rate and Rhythm: Normal rate and regular rhythm.     Pulses: Normal pulses.     Heart sounds: Normal heart sounds.  Pulmonary:     Effort: Pulmonary effort is normal.     Breath sounds: Normal breath sounds. No wheezing, rhonchi or rales.  Abdominal:     General: Bowel sounds are normal.  Musculoskeletal:        General: Normal range of motion.     Right lower leg: No edema.     Left lower leg: No edema.     Comments: Decreased neck rotation left and sidebending left; prior fusion  Skin:    General: Skin is warm and dry.  Neurological:     General: No focal deficit present.     Mental Status: He is alert and oriented to person, place, and  time.  Psychiatric:        Mood and Affect: Mood normal.        Behavior: Behavior normal.        Thought Content: Thought content normal.        Judgment: Judgment normal.     Labs reviewed: Basic Metabolic Panel: Recent Labs    11/25/19 0804 12/02/19 0844 03/30/20 0829  NA 143 142 141  K 4.1 4.6 4.3  CL 108 106 108  CO2 28 23 26   GLUCOSE 107* 98 105*  BUN 20 14 17   CREATININE 0.96 0.95 1.01  CALCIUM 9.5 9.1 9.2  TSH 3.27  --   --    Liver Function Tests: Recent Labs    07/22/19 0843 11/25/19 0804 12/02/19 0844  AST 15 14 17   ALT 21 19 19   ALKPHOS  --   --  98  BILITOT 0.6 0.5 0.3  PROT 5.6* 5.9* 5.5*  ALBUMIN  --   --  4.1   No results for input(s): LIPASE, AMYLASE in the last 8760 hours. No results for input(s): AMMONIA in the last 8760 hours. CBC: Recent Labs    07/22/19 0843 11/25/19 0804  WBC 7.0 7.8  NEUTROABS 4,396 4,953  HGB 15.9 16.1  HCT 47.1 47.5  MCV 86.9 88.1  PLT 205 220   Lipid Panel: Recent Labs    11/25/19 0804 12/02/19 0844 03/30/20 0829  CHOL 172 173 176  HDL 42 41 39*  LDLCALC 105* 107* 107*  TRIG 141 139 178*  CHOLHDL 4.1 4.2 4.5   Lab Results  Component Value Date   HGBA1C 5.3 03/30/2020    Assessment/Plan 1. Other hyperlipidemia - cont zetia  - needs to be on something more for this like repatha or praluent if it can be covered--we have not had good success with getting coverage through primary care - Lipid panel; Future  2. Statin myopathy -was an issue with all statins he took while under Dr. Rolly Salter care  3. Morbid obesity ([place]) -ongoing, was doing well with choices, but fell  off the wagon when he went to camp--ate poorly there  4. Hyperglycemia -has been controlled, encouraged healthy diet and exercise  5. Essential hypertension -bp is controlled on current regimen  6. Cervicalgia -seems to have a muscle spasm of his neck at present -encouraged topicals and heat, massage -if not getting better, f/u  with Dr. Trenton Gammon  7. Bilateral ocular hypertension -no recent problems or changes, f/u at [facility] as planned  Labs/tests ordered:   Lab Orders     Lipid panel  Next appt:  07/31/2020   Jeshawn Melucci L. Bubber Rothert, D.O. [facility] [address],  [address] Cell Phone (Mon-Fri 8am-5pm):  [phone] On Call:  [phone] & follow prompts after 5pm & weekends Office Phone:  [phone] Office Fax:  [phone]

## 2020-04-16 ENCOUNTER — Other Ambulatory Visit: Payer: Self-pay | Admitting: Student

## 2020-04-16 DIAGNOSIS — M5412 Radiculopathy, cervical region: Secondary | ICD-10-CM | POA: Diagnosis not present

## 2020-04-16 DIAGNOSIS — Z981 Arthrodesis status: Secondary | ICD-10-CM | POA: Diagnosis not present

## 2020-04-16 DIAGNOSIS — M542 Cervicalgia: Secondary | ICD-10-CM | POA: Diagnosis not present

## 2020-04-16 DIAGNOSIS — Z6838 Body mass index (BMI) 38.0-38.9, adult: Secondary | ICD-10-CM | POA: Diagnosis not present

## 2020-05-11 ENCOUNTER — Ambulatory Visit
Admission: RE | Admit: 2020-05-11 | Discharge: 2020-05-11 | Disposition: A | Discharge status: Home / Self Care | Payer: PPO | Source: Ambulatory Visit | Attending: Student | Admitting: Student

## 2020-05-11 ENCOUNTER — Other Ambulatory Visit: Payer: Self-pay

## 2020-05-11 DIAGNOSIS — M5412 Radiculopathy, cervical region: Secondary | ICD-10-CM

## 2020-05-11 DIAGNOSIS — M4802 Spinal stenosis, cervical region: Secondary | ICD-10-CM | POA: Diagnosis not present

## 2020-05-14 DIAGNOSIS — M5412 Radiculopathy, cervical region: Secondary | ICD-10-CM | POA: Diagnosis not present

## 2020-05-15 ENCOUNTER — Other Ambulatory Visit: Payer: Self-pay

## 2020-05-15 ENCOUNTER — Encounter: Payer: Self-pay | Admitting: Cardiovascular Disease

## 2020-05-15 ENCOUNTER — Ambulatory Visit: Payer: PPO | Admitting: Cardiovascular Disease

## 2020-05-15 DIAGNOSIS — I2510 Atherosclerotic heart disease of native coronary artery without angina pectoris: Secondary | ICD-10-CM | POA: Diagnosis not present

## 2020-05-15 DIAGNOSIS — I10 Essential (primary) hypertension: Secondary | ICD-10-CM | POA: Diagnosis not present

## 2020-05-15 DIAGNOSIS — I519 Heart disease, unspecified: Secondary | ICD-10-CM

## 2020-05-15 DIAGNOSIS — I5189 Other ill-defined heart diseases: Secondary | ICD-10-CM

## 2020-05-15 DIAGNOSIS — E785 Hyperlipidemia, unspecified: Secondary | ICD-10-CM

## 2020-05-15 DIAGNOSIS — E6609 Other obesity due to excess calories: Secondary | ICD-10-CM | POA: Diagnosis not present

## 2020-05-15 DIAGNOSIS — Z6838 Body mass index (BMI) 38.0-38.9, adult: Secondary | ICD-10-CM | POA: Diagnosis not present

## 2020-05-15 DIAGNOSIS — G4733 Obstructive sleep apnea (adult) (pediatric): Secondary | ICD-10-CM | POA: Diagnosis not present

## 2020-05-15 DIAGNOSIS — Z789 Other specified health status: Secondary | ICD-10-CM | POA: Diagnosis not present

## 2020-05-15 NOTE — Progress Notes (Signed)
Patient ID: Anthony [name]., male   DOB: 1950/09/21, 69 y.o.   MRN: [id number]    HPI: Anthony [name]. is a 69 y.o. male who presents to the office for a 3 month  follow-up cardiology evaluation.   Anthony [name] has a history of hypertension, mild obesity, as well as hyperlipidemia. In the past, Anthony [name] did develop CPK elevation secondary to statin therapy. Anthony [name] has been able to tolerate Zetia. A nuclear perfusion study done in October 2013 for chest pain showed normal perfusion without scar or ischemia.   Laboratory in October 2014 showed cholesterol 196, triglycerides significantly elevated at 291, HDL of 34, VLDL increased at 58, and  LDL 106. Renal function was normal. BUN 15 kerning 0.79.   Anthony [name] developed recurrent episodes of chest pain for which Anthony [name] saw [name] and because of exertionally precipitated episodes of discomfort definitive cardiac catheterization was recommended. This was done by me on 09/05/2013 and revealed normal coronary arteries with normal LV function without focal segmental wall motion abnormalities. Ejection fraction was 55%.  Anthony [name] underwent an echo Doppler study on 09/04/2013 which showed an ejection fraction of 55-60%. Anthony [name] did have grade 1 diastolic dysfunction and mild left ventricular hypertrophy. Anthony [name] had mild mitral annular calcification with mildly thickened leaflets and trivial mitral regurgitation. A moderate pressure estimate was 27 mm.  Anthony [name] was  seen by [name] on 08/17/2016 with complaints of chest discomfort.  Anthony [name]'s episodes would occur often when Anthony [name] was walking on his driveway her in the grocery store and had been ongoing for 1-2 months.  Anthony [name] had diffuse tenderness to palpation along his left pectoral region.  On exam it was felt that some of his chest pain was musculoskeletal in etiology.  Anthony [name] was hypertensive and she further titrated amlodipine.  Because of exertional dyspnea.  She referred him for an echo Doppler study which was done on 08/23/2016.  This showed mild  LVH with normal systolic function and grade 1 diastolic dysfunction.  There is very mild increased peak PA pressure 34 mm.  I saw him in January 2019 after not having seen him since February 2015.  At that time, his pressure was elevated despite taking amlodipine 7.5 mg.  I recommended further titration to 10 mg daily.  On this increased dose, Anthony [name] states his blood pressure at home typically has been running in the 130-135 range.  Anthony [name] denies chest pain PND orthopnea.  Anthony [name] has a history of GERD which is controlled with Nexium.  Anthony [name] has had some issues with left arm paresthesias and was diagnosed with significant cervical degenerative disc disease.  Anthony [name] is scheduled to undergo neck surgery on December 12, 2017 by Dr. Trenton Gammon involving C4-5 and C5-6.  Preoperative surgical clearance was recommended.  Anthony [name] denies any chest pain or shortness of breath.  Anthony [name] has not been exercising as Anthony [name] had in the past in the winter months but hopes to do so following his surgery and with improved weather.    When I saw him in April 2019 Anthony [name] was given surgical clearance for his cervical surgery.  Anthony [name] underwent successful surgery by Dr. Trenton Gammon in July 2019 involving C4-5 and C5-6 and tolerated this well.   I last saw him in January 2020 at that time Anthony [name] had begun to notice some episodes of chest wall discomfort.  Anthony [name] denied any clear-cut exertional precipitation and noted the chest discomfort intermittently.  Anthony [name] was unable to walk well secondary to bilateral knee and hip replacement surgery.  Anthony [name] also had experience of mild exertional shortness of breath.  I felt most likely that his chest pain was of musculoskeletal etiology.  However with his exertional dyspnea I recommended reevaluation of his coronary anatomy and suggested CT coronary angiography and also a 2-year follow-up echo Doppler assessment.  His echo study was done on August 30, 2018 and showed an EF of 60 to 65% without wall motion abnormalities.  There was grade 2 diastolic dysfunction.   There was mild increased PA pressure at 35 mm.  There was mild MR.  Coronary CTA yielded calcium score of 26.4 which was 20th percentile for age and sex matched control.  Anthony [name] had normal coronary origin with right dominance.  There was minimal plaque in the ostial LAD and OM1.  CT demonstrated atherosclerotic disease in the descending thoracic aorta.  I saw him on March 04, 2019 at which time Anthony [name] denied any chest pain, palpitations or dyspnea.  Anthony [name] was experiencing bilateral leg swelling despite taking HCTZ.   During this COVID pandemic, Anthony [name] has gained approximately 15 pounds.  Anthony [name] has not been exercising due to hip discomfort.  Laboratory in March 2020 showed a total cholesterol 173, triglycerides 143 HDL 46 and LDL 103.  During that evaluation I recommended Anthony [name] discontinue hydrochlorothiazide and in its place start furosemide 40 mg for 2 days and then 20 mg daily.  I saw him in follow-up on September 03, 2019.  Prior to that evaluation Anthony [name] was  on amlodipine 10 mg, benazepril 40 mg, furosemide 20 mg daily.  At times Anthony [name] does experience shortness of breath and notes swelling right leg greater than left.  Anthony [name] has been able to take Zetia 10 mg in addition to very low-dose rosuvastatin 5 mg once a week and is tolerating this well.  Recently Anthony [name] has noticed slight increase in the shortness of breath.  Anthony [name] admits to further weight gain.  During that evaluation with his shortness of breath spironolactone 12.5 mg was added to his medical regimen both from previous improved blood pressure control as well as potential improvement in diastolic dysfunction contributing to his exertional dyspnea.  Laboratory 1 month ago showed total cholesterol 177, HDL 40 triglycerides 182 LDL 107.  BUN 13 creatinine 0.96.  I saw him in follow-up on October 07, 2019.  Repeat chemistry profile showed a potassium of 4.5.  BUN 13 and creatinine 1.06.  Over the prior month, Anthony [name] has had purposeful weight loss contributed by reduction in his meal servings.   Anthony [name] did receive his Covid vaccination.  Anthony [name] did experience some nonexertional shoulder chest soreness following the vaccination.  Anthony [name] denies any exertional chest pain symptomatology.  During his most recent evaluation blood pressure was improved but was still 138/78 and I recommended slight additional titration of spironolactone to 25 mg daily.  I last saw him in June 2021 at that time Anthony [name] was experiencing mild chest tightness for 3 to 4 weeks.   The symptoms are not always exertional.  Anthony [name] admits to decreased energy.  Anthony [name] has been having difficulty with reflux.  Anthony [name] also experiences some sharp pain discomfort going to his back which is nonexertional.  Anthony [name] has been on amlodipine 10 mg, benazepril 40 mg, furosemide 20 mg daily and spironolactone 25 mg daily for his blood pressure and shortness of breath.  Anthony [name] was no longer taking low-dose Crestor and has continued to be on Zetia.  Lipid studies on December 02, 2019 showed an LDL cholesterol that had risen to 107.  Total cholesterol was 173 triglycerides 139.  Anthony [name] sees Dr. Cristina Gong who may want to do esophageal dilatation in the future.  Anthony [name] admitted to fatigability, no energy, nocturia at least 3-4 times per night as well as the need to take a nap every day.  During that evaluation, since Anthony [name] had stopped statin therapy I recommended necklace at 180/10 which is combination bempedoic acid plus Zetia in 1 pill.  We also discussed possible sleep apnea and the importance of weight loss.  Since I last saw him, Anthony [name] states Anthony [name] could not tolerate bempedoic acid due to development of abdominal cramps and for this reason has only been on Zetia.  Anthony [name] continues to have GERD but is not having any anginal symptoms.  Anthony [name] has difficulty walking secondary to his bilateral knee replacements and hip surgery.  Anthony [name] continues to experience daytime sleepiness, nocturia, and admits to snoring.  Anthony [name] presents for evaluation.  Past Medical History:  Diagnosis Date  . Benign neoplasm of colon   . Benign  neoplasm of skin of upper limb, including shoulder   . Carpal tunnel syndrome   . Chest pain    a. normal cors by cath in 2015  . Diaphragmatic hernia without mention of obstruction or gangrene   . Dyslipidemia   . Elevated prostate specific antigen (PSA)   . GERD (gastroesophageal reflux disease)   . Herpes zoster without mention of complication   . History of hiatal hernia   . Hypertension   . Hypertrophy of prostate with urinary obstruction and other lower urinary tract symptoms (LUTS)   . Impotence of organic origin   . Lumbago   . Obesity, unspecified   . Osteoarthrosis, unspecified whether generalized or localized, lower leg   . Other and unspecified hyperlipidemia   . Other malaise and fatigue   . Other seborrheic keratosis   . Special screening for malignant neoplasm of prostate   . Stenosis, cervical spine   . Unspecified tinnitus     Past Surgical History:  Procedure Laterality Date  . ANTERIOR CERVICAL DECOMP/DISCECTOMY FUSION N/A 02/19/2018   Procedure: ANTERIOR CERVICAL DECOMPRESSION/DISCECTOMY FUSION - CERVICAL FOUR-CERVICAL FIVE - CERVICAL FIVE-CERVICAL SIX;  Surgeon: Earnie Larsson, MD;  Location: [facility];  Service: Neurosurgery;  Laterality: N/A;  . CARDIOVASCULAR STRESS TEST  06/15/2012   Non-diagnostic for ischemia. No lexiscan EKG changes.  . CHOLECYSTECTOMY  1992  . EYE SURGERY    . eyeband  2006   removal of right eyeband  . INGUINAL HERNIA REPAIR  1984   left  . INTRAOCULAR LENS REMOVAL  2008   right  . LEFT HEART CATHETERIZATION WITH CORONARY ANGIOGRAM N/A 09/05/2013   Procedure: LEFT HEART CATHETERIZATION WITH CORONARY ANGIOGRAM;  Surgeon: Troy Sine, MD;  Location: [facility] CATH LAB;  Service: Cardiovascular;  Laterality: N/A;  . LOWER VENOUS EXTREMITY DOPPLER  01/26/2011   No evidence of thrombus or thrombophlebitis.  Marland Kitchen RETINAL DETACHMENT SURGERY  2001   right  . TOTAL HIP ARTHROPLASTY  2012   bilateral  . TOTAL KNEE ARTHROPLASTY  2011, 2012   bilateral   . TRANSTHORACIC ECHOCARDIOGRAM  10/05/2009   EF >55%, normal LV size, systolic, and diastolic function.    Allergies  Allergen Reactions  . Lipitor [Atorvastatin] Other (See Comments)    Leg pain/cramps; statin myopathy  . Red Yeast Rice [Cholestin] Other (See Comments)    Makes legs hurt, myopathy  . Statins Other (See Comments)    "muscle break down", statin myopathy  . Codeine Other (  See Comments)    Headache  . Latex Rash    Current Outpatient Medications  Medication Sig Dispense Refill  . amLODipine (NORVASC) 10 MG tablet TAKE 1 TABLET BY MOUTH EVERY DAY IN THE MORNING 90 tablet 1  . aspirin EC 81 MG tablet Take 81 mg by mouth every morning.    . benazepril (LOTENSIN) 40 MG tablet Take 1 tablet (40 mg total) by mouth daily. 90 tablet 1  . cyclobenzaprine (FLEXERIL) 10 MG tablet Take 10 mg by mouth 3 (three) times daily.    . dorzolamide-timolol (COSOPT) 22.3-6.8 MG/ML ophthalmic solution Place 2 drops into the right eye 2 (two) times daily.     Marland Kitchen esomeprazole (NEXIUM) 40 MG capsule TAKE 1 CAPSULE BY MOUTH AT BEDTIME. FOR REFLUX 90 capsule 1  . ezetimibe (ZETIA) 10 MG tablet TAKE 1 TABLET BY MOUTH EVERY DAY 90 tablet 1  . finasteride (PROSCAR) 5 MG tablet TAKE 1 TABLET BY MOUTH EVERY DAY IN THE MORNING 90 tablet 1  . furosemide (LASIX) 40 MG tablet Take 0.5 tablets (20 mg total) by mouth daily. 45 tablet 1  . meloxicam (MOBIC) 15 MG tablet Take 15 mg by mouth daily.    . Polyvinyl Alcohol-Povidone (REFRESH OP) Place 1 drop into the right eye daily as needed (dry eyes/irritation).    Marland Kitchen spironolactone (ALDACTONE) 25 MG tablet Take 1 tablet (25 mg total) by mouth daily. Take 87m daily 90 tablet 3   No current facility-administered medications for this visit.    Social History   Socioeconomic History  . Marital status: Married    Spouse name: Not on file  . Number of children: Not on file  . Years of education: Not on file  . Highest education level: Not on file   Occupational History  . 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 Smoking status: Never Smoker  . Smokeless tobacco: Never Used  Vaping Use  . Vaping Use: Never used  Substance and Sexual Activity  . Alcohol use: No  . Drug use: No  . Sexual activity: Yes    Partners: Female    Comment: wife  Other Topics Concern  . Not on file  Social History Narrative   Married   Never smoked   Alcohol none   Exercise -gym 3 days a week (join 3 months ago)   Living Will   Social Determinants of Health   Financial Resource Strain:   . Difficulty of Paying Living Expenses: Not on file  Food Insecurity:   . Worried About RCharity fundraiserin the Last Year: Not on file  . Ran Out of Food in the Last Year: Not on file  Transportation Needs:   . Lack of Transportation (Medical): Not on file  . Lack of Transportation (Non-Medical): Not on file  Physical Activity:   . Days of Exercise per Week: Not on file  . Minutes of Exercise per Session: Not on file  Stress:   . Feeling of Stress : Not on file  Social Connections:   . Frequency of Communication with Friends and Family: Not on file  . Frequency of Social Gatherings with Friends and Family: Not on file  . Attends Religious Services: Not on file  . Active Member of Clubs or Organizations: Not on file  . Attends CArchivistMeetings: Not on file  . Marital Status: Not on file  Intimate Partner Violence:   . Fear  of Current or Ex-Partner: Not on file  . Emotionally Abused: Not on file  . Physically Abused: Not on file  . Sexually Abused: Not on file   Social is notable that Anthony [name] is married has 2 children and 2 grandchildren. Anthony [name] is not routinely exercise. There is no tobacco use. Anthony [name] does drink occasional alcohol.  Family History  Problem Relation Age of Onset  . Diabetes Mother   . Arrhythmia Mother   . Hypertension Mother   . Dementia Father   . Pulmonary embolism Father   .  Hypertension Father   . Cancer Sister        Liver cancer - Histocytic lymphoma  . Cancer Sister 52       Skin cancer  . Hypertension Sister 32  . Diabetes Sister   . Stroke Brother   . Diabetes Brother   . Hypertension Brother   . Cancer Daughter   . Cancer Maternal Grandmother        Skin cancer  . Heart attack Maternal Grandfather   . Cancer Paternal Grandfather   . Hypertension Paternal Grandfather     ROS General: Negative; No fevers, chills, or night sweats;  positive for a 16 pound weight loss in September 2018.  Positive for fatigue HEENT: Negative; No changes in vision or hearing, sinus congestion, difficulty swallowing Pulmonary: Negative; No cough, wheezing, shortness of breath, hemoptysis Cardiovascular: Positive for occasional exertional shortness of breath.  No exertional chest pain.  Positive for right leg greater than left swelling GI: Positive for GERD  GU: Positive for nocturia 3-4 times per night Musculoskeletal: Lateral hip and knee discomfort; status post recent surgery C4-5/C5-6 Hematologic/Oncology: Negative; no easy bruising, bleeding Endocrine: Negative; no heat/cold intolerance; no diabetes Neuro: Mild left arm paresthesias from his cervical degenerative disease Skin: Negative; No rashes or skin lesions Psychiatric: Negative; No behavioral problems, depression Sleep: Negative; No snoring, daytime sleepiness, hypersomnolence, bruxism, restless legs, hypnogognic hallucinations, no cataplexy Other comprehensive 14 point system review is negative.  PE BP 130/70   Pulse 82   Temp (!) 97.3 F (36.3 C)   Ht '5\' 10"'  (1.778 m)   Wt 274 lb (124.3 kg)   SpO2 98%   BMI 39.31 kg/m    Repeat blood pressure was 140/78  Wt Readings from Last 3 Encounters:  05/15/20 274 lb (124.3 kg)  04/02/20 268 lb 6.4 oz (121.7 kg)  01/22/20 279 lb 9.6 oz (126.8 kg)   General: Alert, oriented, no distress.  Skin: normal turgor, no rashes, warm and dry HEENT:  Normocephalic, atraumatic. Pupils equal round and reactive to light; sclera anicteric; extraocular muscles intact;  Nose without nasal septal hypertrophy Mouth/Parynx benign; Mallinpatti scale 3/4 Neck: No JVD, no carotid bruits; normal carotid upstroke Lungs: clear to ausculatation and percussion; no wheezing or rales Chest wall: without tenderness to palpitation Heart: PMI not displaced, RRR, s1 s2 normal, 1/6 systolic murmur, no diastolic murmur, no rubs, gallops, thrills, or heaves Abdomen: Central adiposity soft, nontender; no hepatosplenomehaly, BS+; abdominal aorta nontender and not dilated by palpation. Back: no CVA tenderness Pulses 2+ Musculoskeletal: full range of motion, normal strength, no joint deformities Extremities: no clubbing cyanosis or edema, Homan's sign negative  Neurologic: grossly nonfocal; Cranial nerves grossly wnl Psychologic: Normal mood and affect   ECG (independently read by me): Normal sinus rhythm at 82 bpm.  No significant ST-T change.  Normal intervals.  No ectopy  June 2021 ECG (independently read by me): NSR at 73;  No ectopy, no  ST changes, normal intervals  January 12,2021 ECG (independently read by me): Normal sinus rhythm at 81 bpm.  No ectopy.  Normal intervals.  July 2020 ECG (independently read by me): NSR at 70  January 2020 ECG (independently read by me): Normal sinus rhythm at 74 bpm.  No ectopy.  No ST segment changes.  April 2019 ECG (independently read by me): Normal sinus rhythm at 66 bpm.  Normal intervals.  No ectopy.  No ST segment changes.  August 28, 2017 ECG (independently read by me): Normal sinus rhythm at 63 bpm.  Normal intervals.  No ST segment changes.  February 2015 ECG (independently read by me): Normal sinus rhythm at 66 beats per minute. PR interval 160 ms. QTC intervals 34 ms. No significant ST-T changes.  November 2014 ECG (independently read by me)::Normal sinus rhythm at 69 beats per minute. No ectopy. Normal  intervals.  LABS:  BMP Latest Ref Rng & Units 03/30/2020 12/02/2019 11/25/2019  Glucose 65 - 99 mg/dL 105(H) 98 107(H)  BUN 7 - 25 mg/dL '17 14 20  ' Creatinine 0.70 - 1.18 mg/dL 1.01 0.95 0.96  BUN/Creat Ratio 6 - 22 (calc) NOT APPLICABLE 15 NOT APPLICABLE  Sodium 158 - 146 mmol/L 141 142 143  Potassium 3.5 - 5.3 mmol/L 4.3 4.6 4.1  Chloride 98 - 110 mmol/L 108 106 108  CO2 20 - 32 mmol/L '26 23 28  ' Calcium 8.6 - 10.3 mg/dL 9.2 9.1 9.5   CBC Latest Ref Rng & Units 11/25/2019 07/22/2019 02/19/2018  WBC 3.8 - 10.8 Thousand/uL 7.8 7.0 6.7  Hemoglobin 13.2 - 17.1 g/dL 16.1 15.9 14.6  Hematocrit 38 - 50 % 47.5 47.1 43.0  Platelets 140 - 400 Thousand/uL 220 205 188   Lab Results  Component Value Date   TSH 3.27 11/25/2019    Lab Results  Component Value Date   HGBA1C 5.3 03/30/2020   Hepatic Function Latest Ref Rng & Units 12/02/2019 11/25/2019 07/22/2019  Total Protein 6.0 - 8.5 g/dL 5.5(L) 5.9(L) 5.6(L)  Albumin 3.8 - 4.8 g/dL 4.1 - -  AST 0 - 40 IU/L '17 14 15  ' ALT 0 - 44 IU/L '19 19 21  ' Alk Phosphatase 39 - 117 IU/L 98 - -  Total Bilirubin 0.0 - 1.2 mg/dL 0.3 0.5 0.6   Lipid Panel     Component Value Date/Time   CHOL 176 03/30/2020 0829   CHOL 173 12/02/2019 0844   CHOL 191 07/22/2013 0835   TRIG 178 (H) 03/30/2020 0829   TRIG 253 (H) 07/22/2013 0835   HDL 39 (L) 03/30/2020 0829   HDL 41 12/02/2019 0844   HDL 38 (L) 07/22/2013 0835   CHOLHDL 4.5 03/30/2020 0829   VLDL 35 (H) 10/27/2016 0825   LDLCALC 107 (H) 03/30/2020 0829   LDLCALC 102 (H) 07/22/2013 6825    RADIOLOGY: No results found.  IMPRESSION:  1. Essential hypertension   2. Atherosclerosis of native coronary artery of native heart without angina pectoris   3. Evaluate for OSA (obstructive sleep apnea)   4. Hyperlipidemia with target LDL less than 70   5. Grade II diastolic dysfunction   6. Class 2 obesity due to excess calories with body mass index (BMI) of 39.0 to 39.9 in adult, unspecified whether serious  comorbidity present   7. Statin intolerance     ASSESSMENT AND PLAN: Anthony [name] is a 69 year-old gentleman who has a history of obesity, hyperlipidemia, hypertension, and GERD.  Anthony [name] had developed chest discomfort 2015 which ultimately  led to definitive cardiac catheterization which revealed normal coronary arteries.  Anthony [name]  underwent successful surgery by Dr. Trenton Gammon involving C4-5 and C5-6 and tolerated this well from a cardiac standpoint.  Anthony [name] had developed some episodes of chest pain which most likely were of musculoskeletal etiology.  A CTA in 2020 showed minimal coronary plaque involving his LAD ostium and OM1 vessel.  Anthony [name] also was noted to have atherosclerosis of his thoracic aorta.  Presently, Anthony [name] is not having any anginal type chest pain and continues to be on amlodipine 10 mg, benazepril 40 mg, and furosemide 20 mg daily.  Blood pressure today is stable.  BMI is 39.31 consistent with near morbid obesity.  In the past Anthony [name] had developed CPK elevation secondary to high-dose statin therapy.  Anthony [name] is on Zetia for hyperlipidemia and did not tolerate bempedoic acid.  I have recommended significant dietary improvement.  I reviewed laboratory from Dr. Mariea Clonts.  LDL cholesterol was 107.  If lipid studies continue to be elevated with his inability to take statin and bempedoic acid Anthony [name] may be a candidate for future PCSK9 inhibition.  I am concerned that Anthony [name] has sleep apnea and is fairly symptomatic.  I have recommended Anthony [name] undergo a split-night sleep study.  I have recommended rechecking chemistry and lipid studies in several months.  I will see him back in the office in 4 months for reevaluation.    Troy Sine, MD, [facility]  05/15/2020 8:12 PM

## 2020-05-15 NOTE — Patient Instructions (Signed)
Medication Instructions:  NO CHANGE *If you need a refill on your cardiac medications before your next appointment, please call your pharmacy*   Lab Work: Your physician recommends that you return for lab work in: 4 MONTHS-FASTING If you have labs (blood work) drawn today and your tests are completely normal, you will receive your results only by:  [place] (if you have MyChart) OR  A paper copy in the mail If you have any lab test that is abnormal or we need to change your treatment, we will call you to review the results.   Testing/Procedures: Your physician has recommended that you have a sleep study. This test records several body functions during sleep, including: brain activity, eye movement, oxygen and carbon dioxide blood levels, heart rate and rhythm, breathing rate and rhythm, the flow of air through your mouth and nose, snoring, body muscle movements, and chest and belly movement.     Follow-Up: At [facility], you and your health needs are our priority.  As part of our continuing mission to provide you with exceptional heart care, we have created designated Provider Care Teams.  These Care Teams include your primary Cardiologist (physician) and Advanced Practice Providers (APPs -  Physician Assistants and Nurse Practitioners) who all work together to provide you with the care you need, when you need it.  We recommend signing up for the patient portal called "MyChart".  Sign up information is provided on this After Visit Summary.  MyChart is used to connect with patients for Virtual Visits (Telemedicine).  Patients are able to view lab/test results, encounter notes, upcoming appointments, etc.  Non-urgent messages can be sent to your provider as well.   To learn more about what you can do with MyChart, go to [email or web address].    Your next appointment:   4 month(s)  The format for your next appointment:   In Person  Provider:   Shelva Majestic, MD

## 2020-05-19 ENCOUNTER — Telehealth: Payer: Self-pay | Admitting: *Deleted

## 2020-05-19 NOTE — Telephone Encounter (Signed)
Patient notified of sleep study appointment details.

## 2020-05-27 DIAGNOSIS — M5412 Radiculopathy, cervical region: Secondary | ICD-10-CM | POA: Diagnosis not present

## 2020-06-01 DIAGNOSIS — H40053 Ocular hypertension, bilateral: Secondary | ICD-10-CM | POA: Diagnosis not present

## 2020-06-02 ENCOUNTER — Other Ambulatory Visit: Payer: Self-pay | Admitting: Internal Medicine

## 2020-06-03 DIAGNOSIS — M5412 Radiculopathy, cervical region: Secondary | ICD-10-CM | POA: Diagnosis not present

## 2020-06-05 DIAGNOSIS — M5412 Radiculopathy, cervical region: Secondary | ICD-10-CM | POA: Diagnosis not present

## 2020-06-08 DIAGNOSIS — M5412 Radiculopathy, cervical region: Secondary | ICD-10-CM | POA: Diagnosis not present

## 2020-06-16 DIAGNOSIS — M5412 Radiculopathy, cervical region: Secondary | ICD-10-CM | POA: Diagnosis not present

## 2020-06-16 DIAGNOSIS — I10 Essential (primary) hypertension: Secondary | ICD-10-CM | POA: Diagnosis not present

## 2020-06-16 DIAGNOSIS — Z6839 Body mass index (BMI) 39.0-39.9, adult: Secondary | ICD-10-CM | POA: Diagnosis not present

## 2020-06-21 ENCOUNTER — Encounter (HOSPITAL_BASED_OUTPATIENT_CLINIC_OR_DEPARTMENT_OTHER): Payer: PPO | Admitting: Cardiovascular Disease

## 2020-07-21 DIAGNOSIS — I10 Essential (primary) hypertension: Secondary | ICD-10-CM | POA: Diagnosis not present

## 2020-07-21 DIAGNOSIS — Z6841 Body Mass Index (BMI) 40.0 and over, adult: Secondary | ICD-10-CM | POA: Diagnosis not present

## 2020-07-21 DIAGNOSIS — M47816 Spondylosis without myelopathy or radiculopathy, lumbar region: Secondary | ICD-10-CM | POA: Diagnosis not present

## 2020-07-22 DIAGNOSIS — M5412 Radiculopathy, cervical region: Secondary | ICD-10-CM | POA: Diagnosis not present

## 2020-07-26 ENCOUNTER — Other Ambulatory Visit: Payer: Self-pay | Admitting: Internal Medicine

## 2020-07-26 DIAGNOSIS — E7849 Other hyperlipidemia: Secondary | ICD-10-CM

## 2020-07-27 ENCOUNTER — Other Ambulatory Visit: Payer: Self-pay | Admitting: Cardiovascular Disease

## 2020-07-27 DIAGNOSIS — I10 Essential (primary) hypertension: Secondary | ICD-10-CM

## 2020-07-31 ENCOUNTER — Encounter: Payer: PPO | Admitting: Nurse Practitioner

## 2020-08-03 ENCOUNTER — Other Ambulatory Visit: Payer: Self-pay | Admitting: Internal Medicine

## 2020-08-03 ENCOUNTER — Other Ambulatory Visit: Payer: Self-pay | Admitting: Cardiovascular Disease

## 2020-08-04 ENCOUNTER — Ambulatory Visit (INDEPENDENT_AMBULATORY_CARE_PROVIDER_SITE_OTHER): Payer: PPO | Admitting: Nurse Practitioner

## 2020-08-04 ENCOUNTER — Encounter: Payer: Self-pay | Admitting: Nurse Practitioner

## 2020-08-04 ENCOUNTER — Other Ambulatory Visit: Payer: Self-pay

## 2020-08-04 ENCOUNTER — Telehealth: Payer: Self-pay

## 2020-08-04 DIAGNOSIS — Z1159 Encounter for screening for other viral diseases: Secondary | ICD-10-CM

## 2020-08-04 DIAGNOSIS — M47816 Spondylosis without myelopathy or radiculopathy, lumbar region: Secondary | ICD-10-CM | POA: Diagnosis not present

## 2020-08-04 DIAGNOSIS — I10 Essential (primary) hypertension: Secondary | ICD-10-CM | POA: Diagnosis not present

## 2020-08-04 DIAGNOSIS — Z0000 Encounter for general adult medical examination without abnormal findings: Secondary | ICD-10-CM

## 2020-08-04 DIAGNOSIS — Z6841 Body Mass Index (BMI) 40.0 and over, adult: Secondary | ICD-10-CM | POA: Diagnosis not present

## 2020-08-04 NOTE — Patient Instructions (Signed)
Anthony [name] , Thank you for taking time to come for your Medicare Wellness Visit. I appreciate your ongoing commitment to your health goals. Please review the following plan we discussed and let me know if I can assist you in the future.   Screening recommendations/referrals: Colonoscopy up to date, due 2023 Recommended yearly ophthalmology/optometry visit for glaucoma screening and checkup Recommended yearly dental visit for hygiene and checkup  Vaccinations: Influenza vaccine: up to date Pneumococcal vaccine: up to date Tdap vaccine up to date Shingles vaccine DUE- to get at your local pharmacy    Advanced directives: to complete advance directive and bring to office to place of chart Look over MOST form, Dr Mariea Clonts and you can complete this together.   Conditions/risks identified: obesity, cardiovascular disease, sedentary lifestyle  Next appointment: 1 year.   Preventive Care 71 Years and Older, Male Preventive care refers to lifestyle choices and visits with your health care provider that can promote health and wellness. What does preventive care include?  A yearly physical exam. This is also called an annual well check.  Dental exams once or twice a year.  Routine eye exams. Ask your health care provider how often you should have your eyes checked.  Personal lifestyle choices, including:  Daily care of your teeth and gums.  Regular physical activity.  Eating a healthy diet.  Avoiding tobacco and drug use.  Limiting alcohol use.  Practicing safe sex.  Taking low doses of aspirin every day.  Taking vitamin and mineral supplements as recommended by your health care provider. What happens during an annual well check? The services and screenings done by your health care provider during your annual well check will depend on your age, overall health, lifestyle risk factors, and family history of disease. Counseling  Your health care provider may ask you questions about  your:  Alcohol use.  Tobacco use.  Drug use.  Emotional well-being.  Home and relationship well-being.  Sexual activity.  Eating habits.  History of falls.  Memory and ability to understand (cognition).  Work and work Statistician. Screening  You may have the following tests or measurements:  Height, weight, and BMI.  Blood pressure.  Lipid and cholesterol levels. These may be checked every 5 years, or more frequently if you are over 27 years old.  Skin check.  Lung cancer screening. You may have this screening every year starting at age 97 if you have a 30-pack-year history of smoking and currently smoke or have quit within the past 15 years.  Fecal occult blood test (FOBT) of the stool. You may have this test every year starting at age 69.  Flexible sigmoidoscopy or colonoscopy. You may have a sigmoidoscopy every 5 years or a colonoscopy every 10 years starting at age 21.  Prostate cancer screening. Recommendations will vary depending on your family history and other risks.  Hepatitis C blood test.  Hepatitis B blood test.  Sexually transmitted disease (STD) testing.  Diabetes screening. This is done by checking your blood sugar (glucose) after you have not eaten for a while (fasting). You may have this done every 1-3 years.  Abdominal aortic aneurysm (AAA) screening. You may need this if you are a current or former smoker.  Osteoporosis. You may be screened starting at age 29 if you are at high risk. Talk with your health care provider about your test results, treatment options, and if necessary, the need for more tests. Vaccines  Your health care provider may recommend certain vaccines,  such as:  Influenza vaccine. This is recommended every year.  Tetanus, diphtheria, and acellular pertussis (Tdap, Td) vaccine. You may need a Td booster every 10 years.  Zoster vaccine. You may need this after age 63.  Pneumococcal 13-valent conjugate (PCV13) vaccine.  One dose is recommended after age 25.  Pneumococcal polysaccharide (PPSV23) vaccine. One dose is recommended after age 49. Talk to your health care provider about which screenings and vaccines you need and how often you need them. This information is not intended to replace advice given to you by your health care provider. Make sure you discuss any questions you have with your health care provider. Document Released: 09/04/2015 Document Revised: 04/27/2016 Document Reviewed: 06/09/2015 Elsevier Interactive Patient Education  2017 [place] Prevention in the Home Falls can cause injuries. They can happen to people of all ages. There are many things you can do to make your home safe and to help prevent falls. What can I do on the outside of my home?  Regularly fix the edges of walkways and driveways and fix any cracks.  Remove anything that might make you trip as you walk through a door, such as a raised step or threshold.  Trim any bushes or trees on the path to your home.  Use bright outdoor lighting.  Clear any walking paths of anything that might make someone trip, such as rocks or tools.  Regularly check to see if handrails are loose or broken. Make sure that both sides of any steps have handrails.  Any raised decks and porches should have guardrails on the edges.  Have any leaves, snow, or ice cleared regularly.  Use sand or salt on walking paths during winter.  Clean up any spills in your garage right away. This includes oil or grease spills. What can I do in the bathroom?  Use night lights.  Install grab bars by the toilet and in the tub and shower. Do not use towel bars as grab bars.  Use non-skid mats or decals in the tub or shower.  If you need to sit down in the shower, use a plastic, non-slip stool.  Keep the floor dry. Clean up any water that spills on the floor as soon as it happens.  Remove soap buildup in the tub or shower regularly.  Attach bath  mats securely with double-sided non-slip rug tape.  Do not have throw rugs and other things on the floor that can make you trip. What can I do in the bedroom?  Use night lights.  Make sure that you have a light by your bed that is easy to reach.  Do not use any sheets or blankets that are too big for your bed. They should not hang down onto the floor.  Have a firm chair that has side arms. You can use this for support while you get dressed.  Do not have throw rugs and other things on the floor that can make you trip. What can I do in the kitchen?  Clean up any spills right away.  Avoid walking on wet floors.  Keep items that you use a lot in easy-to-reach places.  If you need to reach something above you, use a strong step stool that has a grab bar.  Keep electrical cords out of the way.  Do not use floor polish or wax that makes floors slippery. If you must use wax, use non-skid floor wax.  Do not have throw rugs and other things on  the floor that can make you trip. What can I do with my stairs?  Do not leave any items on the stairs.  Make sure that there are handrails on both sides of the stairs and use them. Fix handrails that are broken or loose. Make sure that handrails are as long as the stairways.  Check any carpeting to make sure that it is firmly attached to the stairs. Fix any carpet that is loose or worn.  Avoid having throw rugs at the top or bottom of the stairs. If you do have throw rugs, attach them to the floor with carpet tape.  Make sure that you have a light switch at the top of the stairs and the bottom of the stairs. If you do not have them, ask someone to add them for you. What else can I do to help prevent falls?  Wear shoes that:  Do not have high heels.  Have rubber bottoms.  Are comfortable and fit you well.  Are closed at the toe. Do not wear sandals.  If you use a stepladder:  Make sure that it is fully opened. Do not climb a closed  stepladder.  Make sure that both sides of the stepladder are locked into place.  Ask someone to hold it for you, if possible.  Clearly mark and make sure that you can see:  Any grab bars or handrails.  First and last steps.  Where the edge of each step is.  Use tools that help you move around (mobility aids) if they are needed. These include:  Canes.  Walkers.  Scooters.  Crutches.  Turn on the lights when you go into a dark area. Replace any light bulbs as soon as they burn out.  Set up your furniture so you have a clear path. Avoid moving your furniture around.  If any of your floors are uneven, fix them.  If there are any pets around you, be aware of where they are.  Review your medicines with your doctor. Some medicines can make you feel dizzy. This can increase your chance of falling. Ask your doctor what other things that you can do to help prevent falls. This information is not intended to replace advice given to you by your health care provider. Make sure you discuss any questions you have with your health care provider. Document Released: 06/04/2009 Document Revised: 01/14/2016 Document Reviewed: 09/12/2014 Elsevier Interactive Patient Education  2017 [employer].

## 2020-08-04 NOTE — Progress Notes (Signed)
Subjective:   Anthony [name]. is a 69 y.o. male who presents for Medicare Annual/Subsequent preventive examination.  Review of Systems           Objective:    There were no vitals filed for this visit. There is no height or weight on file to calculate BMI.  Advanced Directives 08/04/2020 04/02/2020 11/28/2019 07/31/2019 07/29/2019 04/30/2018 02/19/2018  Does Patient Have a Medical Advance Directive? Yes Yes Yes Yes Yes Yes No  Type of Advance Directive Living will Out of facility DNR (pink MOST or yellow form) - Living will Living will Living will -  Does patient want to make changes to medical advance directive? No - Patient declined No - Guardian declined No - Patient declined No - Patient declined No - Patient declined No - Patient declined -  Copy of [place] in Chart? - - - - - - No - copy requested  Would patient like information on creating a medical advance directive? - - - - - - -  Pre-existing out of facility DNR order (yellow form or pink MOST form) - Pink MOST/Yellow Form most recent copy in chart - Physician notified to receive inpatient order - - - - -    Current Medications (verified) Outpatient Encounter Medications as of 08/04/2020  Medication Sig   amLODipine (NORVASC) 10 MG tablet TAKE 1 TABLET BY MOUTH EVERY DAY IN THE MORNING   aspirin EC 81 MG tablet Take 81 mg by mouth every morning.   benazepril (LOTENSIN) 40 MG tablet Take 1 tablet (40 mg total) by mouth daily.   cyclobenzaprine (FLEXERIL) 10 MG tablet Take 10 mg by mouth 3 (three) times daily. As needed   dorzolamide-timolol (COSOPT) 22.3-6.8 MG/ML ophthalmic solution Place 2 drops into the right eye 2 (two) times daily.    esomeprazole (NEXIUM) 40 MG capsule TAKE 1 CAPSULE BY MOUTH AT BEDTIME. FOR REFLUX   ezetimibe (ZETIA) 10 MG tablet TAKE 1 TABLET BY MOUTH EVERY DAY   finasteride (PROSCAR) 5 MG tablet TAKE 1 TABLET BY MOUTH EVERY DAY IN THE MORNING   furosemide (LASIX) 40 MG  tablet Take 0.5 tablets (20 mg total) by mouth daily.   meloxicam (MOBIC) 15 MG tablet Take 15 mg by mouth daily.   Polyvinyl Alcohol-Povidone (REFRESH OP) Place 1 drop into the right eye daily as needed (dry eyes/irritation).   spironolactone (ALDACTONE) 25 MG tablet Take 1 tablet (25 mg total) by mouth daily. Take 25mg  daily   No facility-administered encounter medications on file as of 08/04/2020.    Allergies (verified) Lipitor [atorvastatin], Red yeast rice [cholestin], Statins, Codeine, and Latex   History: Past Medical History:  Diagnosis Date   Benign neoplasm of colon    Benign neoplasm of skin of upper limb, including shoulder    Carpal tunnel syndrome    Chest pain    a. normal cors by cath in 2015   Diaphragmatic hernia without mention of obstruction or gangrene    Dyslipidemia    Elevated prostate specific antigen (PSA)    GERD (gastroesophageal reflux disease)    Herpes zoster without mention of complication    History of hiatal hernia    Hypertension    Hypertrophy of prostate with urinary obstruction and other lower urinary tract symptoms (LUTS)    Impotence of organic origin    Lumbago    Obesity, unspecified    Osteoarthrosis, unspecified whether generalized or localized, lower leg    Other and unspecified hyperlipidemia  Other malaise and fatigue    Other seborrheic keratosis    Special screening for malignant neoplasm of prostate    Stenosis, cervical spine    Unspecified tinnitus    Past Surgical History:  Procedure Laterality Date   ANTERIOR CERVICAL DECOMP/DISCECTOMY FUSION N/A 02/19/2018   Procedure: ANTERIOR CERVICAL DECOMPRESSION/DISCECTOMY FUSION - CERVICAL FOUR-CERVICAL FIVE - CERVICAL FIVE-CERVICAL SIX;  Surgeon: Earnie Larsson, MD;  Location: [facility];  Service: Neurosurgery;  Laterality: N/A;   CARDIOVASCULAR STRESS TEST  06/15/2012   Non-diagnostic for ischemia. No lexiscan EKG changes.   CHOLECYSTECTOMY  1992   EYE  SURGERY     eyeband  2006   removal of right eyeband   INGUINAL HERNIA REPAIR  1984   left   INTRAOCULAR LENS REMOVAL  2008   right   LEFT HEART CATHETERIZATION WITH CORONARY ANGIOGRAM N/A 09/05/2013   Procedure: LEFT HEART CATHETERIZATION WITH CORONARY ANGIOGRAM;  Surgeon: Troy Sine, MD;  Location: [facility] CATH LAB;  Service: Cardiovascular;  Laterality: N/A;   LOWER VENOUS EXTREMITY DOPPLER  01/26/2011   No evidence of thrombus or thrombophlebitis.   RETINAL DETACHMENT SURGERY  2001   right   TOTAL HIP ARTHROPLASTY  2012   bilateral   TOTAL KNEE ARTHROPLASTY  2011, 2012   bilateral   TRANSTHORACIC ECHOCARDIOGRAM  10/05/2009   EF >55%, normal LV size, systolic, and diastolic function.   Family History  Problem Relation Age of Onset   Diabetes Mother    Arrhythmia Mother    Hypertension Mother    Dementia Father    Pulmonary embolism Father    Hypertension Father    Cancer Sister        Liver cancer - Histocytic lymphoma   Cancer Sister 65       Skin cancer   Hypertension Sister 54   Diabetes Sister    Stroke Brother    Diabetes Brother    Hypertension Brother    Cancer Daughter    Cancer Maternal Grandmother        Skin cancer   Heart attack Maternal Grandfather    Cancer Paternal Grandfather    Hypertension Paternal Grandfather    Social History   Socioeconomic History   Marital status: Married    Spouse name: Not on file   Number of children: Not on file   Years of education: Not on file   Highest education level: Not on file  Occupational History   Occupation: retired Medical sales representative     Comment: Hospital doctor  Tobacco Use   Smoking status: Never Smoker   Smokeless tobacco: Never Used  Scientific laboratory technician Use: Never used  Substance and Sexual Activity   Alcohol use: No   Drug use: No   Sexual activity: Yes    Partners: Female    Comment: wife  Other Topics Concern   Not on file  Social History Narrative    Married   Never smoked   Alcohol none   Exercise -gym 3 days a week (join 3 months ago)   Living Will   Social Determinants of Health   Financial Resource Strain: Not on file  Food Insecurity: Not on file  Transportation Needs: Not on file  Physical Activity: Not on file  Stress: Not on file  Social Connections: Not on file    Tobacco Counseling Counseling given: Not Answered   Clinical Intake:  Diabetic?no         Activities of Daily Living No flowsheet data found.  Patient Care Team: Gayland Curry, DO as PCP - General (Geriatric Medicine) Troy Sine, MD as PCP - Cardiology (Cardiology)  Indicate any recent Medical Services you may have received from other than [facility] providers in the past year (date may be approximate).     Assessment:   This is a routine wellness examination for [place].  Hearing/Vision screen  Hearing Screening   125Hz  250Hz  500Hz  1000Hz  2000Hz  3000Hz  4000Hz  6000Hz  8000Hz   Right ear:           Left ear:           Comments: Patient states he has no hearing problems.  Vision Screening Comments: Patient states he has no vision problems. Patient had recent eye exam within past year. Sees eye doctor at [facility].  Dietary issues and exercise activities discussed:    Goals     Exercise 3x per week (30 min per time)     Starting 08/05/16, I will attempt to exercise 3 x per week.      Weight (lb) < 250 lb (113.4 kg)     Weight loss through diet mostly      Depression Screen PHQ 2/9 Scores 08/04/2020 04/02/2020 11/28/2019 07/31/2019 07/29/2019 07/29/2019 04/25/2019  PHQ - 2 Score 0 0 0 0 0 0 0    Fall Risk Fall Risk  08/04/2020 04/02/2020 11/28/2019 07/31/2019 07/29/2019  Falls in the past year? 0 0 - 0 0  Number falls in past yr: 0 0 0 0 -  Injury with Fall? 0 0 0 0 -    FALL RISK PREVENTION PERTAINING TO THE HOME:  Any stairs in or around the home? Yes  If so, are there any without handrails? No  Home free of  loose throw rugs in walkways, pet beds, electrical cords, etc? Yes  Adequate lighting in your home to reduce risk of falls? Yes   ASSISTIVE DEVICES UTILIZED TO PREVENT FALLS:  Life alert? No  Use of a cane, walker or w/c? Yes  Grab bars in the bathroom? No  Shower chair or bench in shower? Yes  Elevated toilet seat or a handicapped toilet? Yes   TIMED UP AND GO:  Was the test performed? No .  na  Cognitive Function: MMSE - Mini Mental State Exam 07/29/2019 10/31/2017 08/05/2016  Orientation to time 3 4 4   Orientation to Place 5 5 5   Registration 3 3 3   Attention/ Calculation 4 5 3   Recall 3 1 3   Language- name 2 objects 2 2 2   Language- repeat 1 1 1   Language- follow 3 step command 3 3 3   Language- read & follow direction 1 1 1   Write a sentence 1 1 1   Copy design 1 1 1   Total score 27 27 27      6CIT Screen 08/04/2020  What Year? 0 points  What month? 0 points  What time? 0 points  Count back from 20 0 points  Months in reverse 0 points  Repeat phrase 0 points  Total Score 0    Immunizations Immunization History  Administered Date(s) Administered   Fluad Quad(high Dose 65+) 04/25/2019   Influenza, High Dose Seasonal PF 05/04/2017, 06/21/2018   Influenza,inj,Quad PF,6+ Mos 06/11/2013, 05/20/2015, 08/03/2016   Influenza-Unspecified 07/05/2018, 06/07/2020   PFIZER SARS-COV-2 Vaccination 09/10/2019, 09/28/2019, 06/07/2020   Pneumococcal Conjugate-13 09/23/2014   Pneumococcal Polysaccharide-23 06/27/2003, 10/31/2017   Tdap 08/30/2011  TDAP status: Up to date  Flu Vaccine status: Up to date  Pneumococcal vaccine status: Up to date  Covid-19 vaccine status: Completed vaccines  Qualifies for Shingles Vaccine? Yes   Zostavax completed No   Shingrix Completed?: Yes  Screening Tests Health Maintenance  Topic Date Due   TETANUS/TDAP  08/29/2021   COLONOSCOPY  05/08/2022   INFLUENZA VACCINE  Completed   COVID-19 Vaccine  Completed   Hepatitis  C Screening  Discontinued   PNA vac Low Risk Adult  Discontinued    Health Maintenance  There are no preventive care reminders to display for this patient.  Colorectal cancer screening: Type of screening: Colonoscopy. Completed 201. Repeat every 10 years  Lung Cancer Screening: (Low Dose CT Chest recommended if Age 33-80 years, 30 pack-year currently smoking OR have quit w/in 15years.) does not qualify.   Lung Cancer Screening Referral: na  Additional Screening:  Hepatitis C Screening: does qualify; Complete with next blood work.   Vision Screening: Recommended annual ophthalmology exams for early detection of glaucoma and other disorders of the eye. Is the patient up to date with their annual eye exam?  Yes  Who is the provider or what is the name of the office in which the patient attends annual eye exams? [facility] If pt is not established with a provider, would they like to be referred to a provider to establish care? No .   Dental Screening: Recommended annual dental exams for proper oral hygiene  Community Resource Referral / Chronic Care Management: CRR required this visit?  No   CCM required this visit?  No      Plan:     I have personally reviewed and noted the following in the patients chart:    Medical and social history  Use of alcohol, tobacco or illicit drugs   Current medications and supplements  Functional ability and status  Nutritional status  Physical activity  Advanced directives  List of other physicians  Hospitalizations, surgeries, and ER visits in previous 12 months  Vitals  Screenings to include cognitive, depression, and falls  Referrals and appointments  In addition, I have reviewed and discussed with patient certain preventive protocols, quality metrics, and best practice recommendations. A written personalized care plan for preventive services as well as general preventive health recommendations were provided to patient.      Lauree Chandler, NP   08/04/2020    Virtual Visit via Telephone Note  I connected with@ on 08/04/20 at  9:30 AM EST by telephone and verified that I am speaking with the correct person using two identifiers.  Location: Patient: home Provider: [place].    I discussed the limitations, risks, security and privacy concerns of performing an evaluation and management service by telephone and the availability of in person appointments. I also discussed with the patient that there may be a patient responsible charge related to this service. The patient expressed understanding and agreed to proceed.   I discussed the assessment and treatment plan with the patient. The patient was provided an opportunity to ask questions and all were answered. The patient agreed with the plan and demonstrated an understanding of the instructions.   The patient was advised to call back or seek an in-person evaluation if the symptoms worsen or if the condition fails to improve as anticipated.  I provided 18 minutes of non-face-to-face time during this encounter.  Carlos American. Harle Battiest Avs printed and mailed

## 2020-08-04 NOTE — Telephone Encounter (Signed)
Mr. Anthony [name], Anthony [name] are scheduled for a virtual visit with your provider today.    Just as we do with appointments in the office, we must obtain your consent to participate.  Your consent will be active for this visit and any virtual visit you may have with one of our providers in the next 365 days.    If you have a MyChart account, I can also send a copy of this consent to you electronically.  All virtual visits are billed to your insurance company just like a traditional visit in the office.  As this is a virtual visit, video technology does not allow for your provider to perform a traditional examination.  This may limit your provider's ability to fully assess your condition.  If your provider identifies any concerns that need to be evaluated in person or the need to arrange testing such as labs, EKG, etc, we will make arrangements to do so.    Although advances in technology are sophisticated, we cannot ensure that it will always work on either your end or our end.  If the connection with a video visit is poor, we may have to switch to a telephone visit.  With either a video or telephone visit, we are not always able to ensure that we have a secure connection.   I need to obtain your verbal consent now.   Are you willing to proceed with your visit today?   Anthony Bern. has provided verbal consent on 08/04/2020 for a virtual visit (video or telephone).   Anthony [name], CMA 08/04/2020  10:15 AM

## 2020-08-04 NOTE — Progress Notes (Signed)
This service is provided via telemedicine  No vital signs collected/recorded due to the encounter was a telemedicine visit.   Location of patient (ex: home, work):  Home  Patient consents to a telephone visit: Yes, see encounter dated 08/04/2020  Location of the provider (ex: office, home): [place]  Name of any referring provider: Hollace Kinnier, DO  Names of all persons participating in the telemedicine service and their role in the encounter:  Sherrie Mustache, Nurse Practitioner, Carroll Kinds, CMA, and patient.   Time spent on call: 5 minutes with medical assistant

## 2020-08-10 ENCOUNTER — Other Ambulatory Visit: Payer: PPO

## 2020-08-10 ENCOUNTER — Other Ambulatory Visit: Payer: Self-pay

## 2020-08-10 DIAGNOSIS — Z1159 Encounter for screening for other viral diseases: Secondary | ICD-10-CM

## 2020-08-10 DIAGNOSIS — E7849 Other hyperlipidemia: Secondary | ICD-10-CM | POA: Diagnosis not present

## 2020-08-13 ENCOUNTER — Ambulatory Visit (INDEPENDENT_AMBULATORY_CARE_PROVIDER_SITE_OTHER): Payer: PPO | Admitting: Internal Medicine

## 2020-08-13 ENCOUNTER — Other Ambulatory Visit: Payer: Self-pay

## 2020-08-13 ENCOUNTER — Encounter: Payer: Self-pay | Admitting: Internal Medicine

## 2020-08-13 DIAGNOSIS — T466X5A Adverse effect of antihyperlipidemic and antiarteriosclerotic drugs, initial encounter: Secondary | ICD-10-CM

## 2020-08-13 DIAGNOSIS — R739 Hyperglycemia, unspecified: Secondary | ICD-10-CM

## 2020-08-13 DIAGNOSIS — M542 Cervicalgia: Secondary | ICD-10-CM | POA: Diagnosis not present

## 2020-08-13 DIAGNOSIS — E7849 Other hyperlipidemia: Secondary | ICD-10-CM

## 2020-08-13 DIAGNOSIS — I10 Essential (primary) hypertension: Secondary | ICD-10-CM | POA: Diagnosis not present

## 2020-08-13 DIAGNOSIS — G720 Drug-induced myopathy: Secondary | ICD-10-CM | POA: Diagnosis not present

## 2020-08-13 DIAGNOSIS — Z7189 Other specified counseling: Secondary | ICD-10-CM

## 2020-08-13 NOTE — Progress Notes (Signed)
Location:  [facility] clinic Provider:  Naiomy Watters L. Mariea Clonts, D.O., C.M.D.  Code Status: Partial code as does not want to be placed on ventilator  Goals of Care:  Advanced Directives 08/13/2020  Does Patient Have a Medical Advance Directive? Yes  Type of Advance Directive Out of facility DNR (pink MOST or yellow form)  Does patient want to make changes to medical advance directive? No - Patient declined  Copy of [place] in Chart? -  Would patient like information on creating a medical advance directive? -  Pre-existing out of facility DNR order (yellow form or pink MOST form) -     Chief Complaint  Patient presents with  . Medical Management of Chronic Issues    4 month follow up     HPI: Patient is a 69 y.o. male seen today for medical management of chronic diseases.    He completed a copy of a MOST form and discussed it with his wife.  He would not want to be put on a ventilator.  He knows from his father-in-law--he was on a machine and not himself though he lived another 10 yrs afterward.  He for now, does want CPR if needed.  Data explained to him about survival outside of the hospital.  His grand daddy died at the breakfast and did not suffer and his dad died in his sleep.  He's ok with if he has a heart attack having a cath and stents.    Doing pretty well except his weight.  He has had decreased appetite the past couple of weeks.    His arthritis gives him a fit this time of year and his get up and go is gone and feels better in the spring.    He had a back ablation on both sides two weeks apart.  It's helped some--doesn't hurt as bad.  Not quite as good just yet as the first time he had it and it lasted a year.  He's for a neck ablation beginning next wed.  He had an epidural b/w his shoulders before which was challenging.  Dr. Annette Stable recommended the neck ablation.  His hep C screen was negative.  He's done pretty well with holiday eating outside of some cheese  straws.    Past Medical History:  Diagnosis Date  . Benign neoplasm of colon   . Benign neoplasm of skin of upper limb, including shoulder   . Carpal tunnel syndrome   . Chest pain    a. normal cors by cath in 2015  . Diaphragmatic hernia without mention of obstruction or gangrene   . Dyslipidemia   . Elevated prostate specific antigen (PSA)   . GERD (gastroesophageal reflux disease)   . Herpes zoster without mention of complication   . History of hiatal hernia   . Hypertension   . Hypertrophy of prostate with urinary obstruction and other lower urinary tract symptoms (LUTS)   . Impotence of organic origin   . Lumbago   . Obesity, unspecified   . Osteoarthrosis, unspecified whether generalized or localized, lower leg   . Other and unspecified hyperlipidemia   . Other malaise and fatigue   . Other seborrheic keratosis   . Special screening for malignant neoplasm of prostate   . Stenosis, cervical spine   . Unspecified tinnitus     Past Surgical History:  Procedure Laterality Date  . ANTERIOR CERVICAL DECOMP/DISCECTOMY FUSION N/A 02/19/2018   Procedure: ANTERIOR CERVICAL DECOMPRESSION/DISCECTOMY FUSION - CERVICAL FOUR-CERVICAL FIVE -  CERVICAL FIVE-CERVICAL SIX;  Surgeon: Earnie Larsson, MD;  Location: [place];  Service: Neurosurgery;  Laterality: N/A;  . CARDIOVASCULAR STRESS TEST  06/15/2012   Non-diagnostic for ischemia. No lexiscan EKG changes.  . CHOLECYSTECTOMY  1992  . EYE SURGERY    . eyeband  2006   removal of right eyeband  . INGUINAL HERNIA REPAIR  1984   left  . INTRAOCULAR LENS REMOVAL  2008   right  . LEFT HEART CATHETERIZATION WITH CORONARY ANGIOGRAM N/A 09/05/2013   Procedure: LEFT HEART CATHETERIZATION WITH CORONARY ANGIOGRAM;  Surgeon: Troy Sine, MD;  Location: [facility] CATH LAB;  Service: Cardiovascular;  Laterality: N/A;  . LOWER VENOUS EXTREMITY DOPPLER  01/26/2011   No evidence of thrombus or thrombophlebitis.  Marland Kitchen RETINAL DETACHMENT SURGERY  2001   right  .  TOTAL HIP ARTHROPLASTY  2012   bilateral  . TOTAL KNEE ARTHROPLASTY  2011, 2012   bilateral  . TRANSTHORACIC ECHOCARDIOGRAM  10/05/2009   EF >55%, normal LV size, systolic, and diastolic function.    Allergies  Allergen Reactions  . Lipitor [Atorvastatin] Other (See Comments)    Leg pain/cramps; statin myopathy  . Red Yeast Rice [Cholestin] Other (See Comments)    Makes legs hurt, myopathy  . Statins Other (See Comments)    "muscle break down", statin myopathy  . Codeine Other (See Comments)    Headache  . Latex Rash    Outpatient Encounter Medications as of 08/13/2020  Medication Sig  . amLODipine (NORVASC) 10 MG tablet TAKE 1 TABLET BY MOUTH EVERY DAY IN THE MORNING  . aspirin EC 81 MG tablet Take 81 mg by mouth every morning.  . benazepril (LOTENSIN) 40 MG tablet Take 1 tablet (40 mg total) by mouth daily.  . cyclobenzaprine (FLEXERIL) 10 MG tablet Take 10 mg by mouth 3 (three) times daily. As needed  . dorzolamide-timolol (COSOPT) 22.3-6.8 MG/ML ophthalmic solution Place 2 drops into the right eye 2 (two) times daily.   Marland Kitchen esomeprazole (NEXIUM) 40 MG capsule TAKE 1 CAPSULE BY MOUTH AT BEDTIME. FOR REFLUX  . ezetimibe (ZETIA) 10 MG tablet TAKE 1 TABLET BY MOUTH EVERY DAY  . finasteride (PROSCAR) 5 MG tablet TAKE 1 TABLET BY MOUTH EVERY DAY IN THE MORNING  . furosemide (LASIX) 40 MG tablet TAKE 1/2 TABLET BY MOUTH DAILY  . meloxicam (MOBIC) 15 MG tablet Take 15 mg by mouth daily.  . Polyvinyl Alcohol-Povidone (REFRESH OP) Place 1 drop into the right eye daily as needed (dry eyes/irritation).  Marland Kitchen spironolactone (ALDACTONE) 25 MG tablet Take 1 tablet (25 mg total) by mouth daily. Take 25mg  daily   No facility-administered encounter medications on file as of 08/13/2020.    Review of Systems:  Review of Systems  Constitutional: Negative for chills, fever and malaise/fatigue.  HENT: Positive for hearing loss. Negative for congestion and sore throat.   Eyes: Negative for blurred  vision.  Respiratory: Positive for shortness of breath. Negative for cough.        Chronic unchanged dyspnea on exertion  Cardiovascular: Negative for chest pain, palpitations and leg swelling.  Gastrointestinal: Negative for abdominal pain, blood in stool, constipation and melena.  Genitourinary: Negative for dysuria.  Musculoskeletal: Positive for back pain, joint pain, myalgias and neck pain. Negative for falls.  Skin: Negative for itching and rash.  Neurological: Negative for dizziness and loss of consciousness.  Endo/Heme/Allergies: Does not bruise/bleed easily.  Psychiatric/Behavioral: Negative for depression and memory loss. The patient is not nervous/anxious and does not  have insomnia.     Health Maintenance  Topic Date Due  . TETANUS/TDAP  08/29/2021  . COLONOSCOPY  05/08/2022  . INFLUENZA VACCINE  Completed  . COVID-19 Vaccine  Completed  . Hepatitis C Screening  Discontinued  . PNA vac Low Risk Adult  Discontinued    Physical Exam: Vitals:   08/13/20 0812  BP: 128/74  Pulse: 83  Temp: (!) 97.5 F (36.4 C)  TempSrc: Temporal  SpO2: 97%  Weight: 282 lb 11.2 oz (128.2 kg)  Height: 5\' 10"  (1.778 m)   Body mass index is 40.56 kg/m. Physical Exam Vitals reviewed.  Constitutional:      General: He is not in acute distress.    Appearance: Normal appearance. He is obese. He is not toxic-appearing.  Eyes:     Extraocular Movements: Extraocular movements intact.     Pupils: Pupils are equal, round, and reactive to light.  Cardiovascular:     Rate and Rhythm: Normal rate and regular rhythm.     Pulses: Normal pulses.     Heart sounds: Normal heart sounds.  Pulmonary:     Effort: Pulmonary effort is normal.     Breath sounds: Normal breath sounds. No wheezing, rhonchi or rales.  Abdominal:     General: Bowel sounds are normal. There is no distension.     Palpations: Abdomen is soft.     Tenderness: There is no abdominal tenderness.  Neurological:     Mental  Status: He is alert.     Labs reviewed: Basic Metabolic Panel: Recent Labs    11/25/19 0804 12/02/19 0844 03/30/20 0829  NA 143 142 141  K 4.1 4.6 4.3  CL 108 106 108  CO2 28 23 26   GLUCOSE 107* 98 105*  BUN 20 14 17   CREATININE 0.96 0.95 1.01  CALCIUM 9.5 9.1 9.2  TSH 3.27  --   --    Liver Function Tests: Recent Labs    11/25/19 0804 12/02/19 0844  AST 14 17  ALT 19 19  ALKPHOS  --  98  BILITOT 0.5 0.3  PROT 5.9* 5.5*  ALBUMIN  --  4.1   No results for input(s): LIPASE, AMYLASE in the last 8760 hours. No results for input(s): AMMONIA in the last 8760 hours. CBC: Recent Labs    11/25/19 0804  WBC 7.8  NEUTROABS 4,953  HGB 16.1  HCT 47.5  MCV 88.1  PLT 220   Lipid Panel: Recent Labs    11/25/19 0804 12/02/19 0844 03/30/20 0829  CHOL 172 173 176  HDL 42 41 39*  LDLCALC 105* 107* 107*  TRIG 141 139 178*  CHOLHDL 4.1 4.2 4.5   Lab Results  Component Value Date   HGBA1C 5.3 03/30/2020    Procedures since last visit: No results found.  Assessment/Plan 1. Morbid obesity ([place]) -ongoing, reports appetite decreased a little bit lately so he expects he might lose some weight but no true diet changes or exercise routine implemented especially in winter - Lipid panel; Future - Hemoglobin A1c; Future - BASIC METABOLIC PANEL WITH GFR; Future - CBC with Differential/Platelet; Future  2. Hyperglycemia -counseled on diet and exercise - Hemoglobin A1c; Future  3. Other hyperlipidemia - was not done ahead this time as planned--was just added to lab  - Lipid panel; Future  4. Statin myopathy - prevents use of statins for him, cont zetia - Lipid panel; Future  5. Essential hypertension - bp at goal with current regimen so cont same and monitor -  BASIC METABOLIC PANEL WITH GFR; Future  6. Cervicalgia -ongoing, follows with neurosurgery and for nerve ablation next   7. ACP (advance care planning) -MOST completed today--requests NOT to be placed  on ventilator for emergent situations if he's unable to consent; does want CPR and cath, stents if needed for MI; see hpi for more info -MOST to be scanned into vynca -25 mins spent on ACP  Labs/tests ordered:   Lab Orders     Lipid panel     Hemoglobin A1c     BASIC METABOLIC PANEL WITH GFR     CBC with Differential/Platelet  Next appt:  12/14/2020  Tyjae Shvartsman L. Aarushi Hemric, D.O. [facility] [address], [place] [address] Cell Phone (Mon-Fri 8am-5pm):  [phone] On Call:  [phone] & follow prompts after 5pm & weekends Office Phone:  [phone] Office Fax:  [phone]

## 2020-08-14 LAB — TEST AUTHORIZATION

## 2020-08-14 LAB — LIPID PANEL
Cholesterol: 190 mg/dL
HDL: 44 mg/dL
LDL Cholesterol (Calc): 113 mg/dL — ABNORMAL HIGH
Non-HDL Cholesterol (Calc): 146 mg/dL — ABNORMAL HIGH
Total CHOL/HDL Ratio: 4.3 (calc)
Triglycerides: 214 mg/dL — ABNORMAL HIGH

## 2020-08-14 LAB — HEPATITIS C ANTIBODY
Hepatitis C Ab: NONREACTIVE
SIGNAL TO CUT-OFF: 0 (ref <1.00)

## 2020-08-19 DIAGNOSIS — Z6841 Body Mass Index (BMI) 40.0 and over, adult: Secondary | ICD-10-CM | POA: Diagnosis not present

## 2020-08-19 DIAGNOSIS — M461 Sacroiliitis, not elsewhere classified: Secondary | ICD-10-CM | POA: Diagnosis not present

## 2020-08-19 DIAGNOSIS — M47812 Spondylosis without myelopathy or radiculopathy, cervical region: Secondary | ICD-10-CM | POA: Diagnosis not present

## 2020-08-22 HISTORY — PX: COLONOSCOPY: SHX174

## 2020-08-26 DIAGNOSIS — M47812 Spondylosis without myelopathy or radiculopathy, cervical region: Secondary | ICD-10-CM | POA: Diagnosis not present

## 2020-08-26 DIAGNOSIS — Z6841 Body Mass Index (BMI) 40.0 and over, adult: Secondary | ICD-10-CM | POA: Diagnosis not present

## 2020-08-26 DIAGNOSIS — M461 Sacroiliitis, not elsewhere classified: Secondary | ICD-10-CM | POA: Diagnosis not present

## 2020-08-26 DIAGNOSIS — I10 Essential (primary) hypertension: Secondary | ICD-10-CM | POA: Diagnosis not present

## 2020-09-09 DIAGNOSIS — M47812 Spondylosis without myelopathy or radiculopathy, cervical region: Secondary | ICD-10-CM | POA: Diagnosis not present

## 2020-09-15 DIAGNOSIS — L723 Sebaceous cyst: Secondary | ICD-10-CM | POA: Diagnosis not present

## 2020-09-15 DIAGNOSIS — L570 Actinic keratosis: Secondary | ICD-10-CM | POA: Diagnosis not present

## 2020-09-15 DIAGNOSIS — D239 Other benign neoplasm of skin, unspecified: Secondary | ICD-10-CM | POA: Diagnosis not present

## 2020-09-15 DIAGNOSIS — L821 Other seborrheic keratosis: Secondary | ICD-10-CM | POA: Diagnosis not present

## 2020-09-15 DIAGNOSIS — D171 Benign lipomatous neoplasm of skin and subcutaneous tissue of trunk: Secondary | ICD-10-CM | POA: Diagnosis not present

## 2020-09-15 DIAGNOSIS — L814 Other melanin hyperpigmentation: Secondary | ICD-10-CM | POA: Diagnosis not present

## 2020-09-15 DIAGNOSIS — L578 Other skin changes due to chronic exposure to nonionizing radiation: Secondary | ICD-10-CM | POA: Diagnosis not present

## 2020-09-20 ENCOUNTER — Other Ambulatory Visit: Payer: Self-pay | Admitting: Internal Medicine

## 2020-09-21 NOTE — Telephone Encounter (Signed)
VERY HIGH WARNING came up when trying to refill medication. Medications pended and sent to Dr. Mariea Clonts for approval

## 2020-09-21 NOTE — Telephone Encounter (Signed)
rx sent to pharmacy by e-script  

## 2020-09-28 ENCOUNTER — Ambulatory Visit: Payer: PPO | Admitting: Cardiovascular Disease

## 2020-09-28 ENCOUNTER — Encounter: Payer: Self-pay | Admitting: Cardiovascular Disease

## 2020-09-28 ENCOUNTER — Other Ambulatory Visit: Payer: Self-pay

## 2020-09-28 VITALS — BP 110/60 | HR 74 | Ht 70.0 in | Wt 284.0 lb

## 2020-09-28 DIAGNOSIS — I10 Essential (primary) hypertension: Secondary | ICD-10-CM

## 2020-09-28 DIAGNOSIS — I5189 Other ill-defined heart diseases: Secondary | ICD-10-CM

## 2020-09-28 DIAGNOSIS — Z789 Other specified health status: Secondary | ICD-10-CM

## 2020-09-28 DIAGNOSIS — E785 Hyperlipidemia, unspecified: Secondary | ICD-10-CM

## 2020-09-28 DIAGNOSIS — I2510 Atherosclerotic heart disease of native coronary artery without angina pectoris: Secondary | ICD-10-CM | POA: Diagnosis not present

## 2020-09-28 NOTE — Progress Notes (Signed)
Patient ID: Anthony [name]., male   DOB: [birth year], 70 y.o.   MRN: [id number]    HPI: Anthony Eoff. is a 70 y.o. male who presents to the office for a 5 month  follow-up cardiology evaluation.   Anthony [name] has a history of hypertension, mild obesity, as well as hyperlipidemia. In the past, he did develop CPK elevation secondary to statin therapy. He has been able to tolerate Zetia. A nuclear perfusion study done in October 2013 for chest pain showed normal perfusion without scar or ischemia.   Laboratory in October 2014 showed cholesterol 196, triglycerides significantly elevated at 291, HDL of 34, VLDL increased at 58, and  LDL 106. Renal function was normal. BUN 15 kerning 0.79.   He developed recurrent episodes of chest pain for which he saw Anthony [name] and because of exertionally precipitated episodes of discomfort definitive cardiac catheterization was recommended. This was done by me on 09/05/2013 and revealed normal coronary arteries with normal LV function without focal segmental wall motion abnormalities. Ejection fraction was 55%.  He underwent an echo Doppler study on 09/04/2013 which showed an ejection fraction of 55-60%. He did have grade 1 diastolic dysfunction and mild left ventricular hypertrophy. He had mild mitral annular calcification with mildly thickened leaflets and trivial mitral regurgitation. A moderate pressure estimate was 27 mm.  He was  seen by Anthony [name] on 08/17/2016 with complaints of chest discomfort.  He's episodes would occur often when he was walking on his driveway her in the grocery store and had been ongoing for 1-2 months.  He had diffuse tenderness to palpation along his left pectoral region.  On exam it was felt that some of his chest pain was musculoskeletal in etiology.  He was hypertensive and she further titrated amlodipine.  Because of exertional dyspnea.  She referred him for an echo Doppler study which was done on 08/23/2016.  This showed mild  LVH with normal systolic function and grade 1 diastolic dysfunction.  There is very mild increased peak PA pressure 34 mm.  I saw him in January 2019 after not having seen him since February 2015.  At that time, his pressure was elevated despite taking amlodipine 7.5 mg.  I recommended further titration to 10 mg daily.  On this increased dose, he states his blood pressure at home typically has been running in the 130-135 range.  He denies chest pain PND orthopnea.  He has a history of GERD which is controlled with Nexium.  He has had some issues with left arm paresthesias and was diagnosed with significant cervical degenerative disc disease.  He is scheduled to undergo neck surgery on December 12, 2017 by Anthony [name].  Preoperative surgical clearance was recommended.  He denies any chest pain or shortness of breath.  He has not been exercising as he had in the past in the winter months but hopes to do so following his surgery and with improved weather.    When I saw him in April 2019 he was given surgical clearance for his cervical surgery.  He underwent successful surgery by Anthony [name] involving C4-5 and C5-6 and tolerated this well.   I  saw him in January 2020 at that time he had begun to notice some episodes of chest wall discomfort.  He denied any clear-cut exertional precipitation and noted the chest discomfort intermittently.  He was unable to walk well secondary to bilateral knee and hip replacement surgery.  He also had experience of mild exertional shortness of breath.  I felt most likely that his chest pain was of musculoskeletal etiology.  However with his exertional dyspnea I recommended reevaluation of his coronary anatomy and suggested CT coronary angiography and also a 2-year follow-up echo Doppler assessment.  His echo study was done on August 30, 2018 and showed an EF of 60 to 65% without wall motion abnormalities.  There was grade 2 diastolic dysfunction.   There was mild increased PA pressure at 35 mm.  There was mild MR.  Coronary CTA yielded calcium score of 26.4 which was 20th percentile for age and sex matched control.  He had normal coronary origin with right dominance.  There was minimal plaque in the ostial LAD and OM1.  CT demonstrated atherosclerotic disease in the descending thoracic aorta.  I saw him on March 04, 2019 at which time he denied any chest pain, palpitations or dyspnea.  He was experiencing bilateral leg swelling despite taking HCTZ.   During this COVID pandemic, he has gained approximately 15 pounds.  He has not been exercising due to hip discomfort.  Laboratory in March 2020 showed a total cholesterol 173, triglycerides 143 HDL 46 and LDL 103.  During that evaluation I recommended he discontinue hydrochlorothiazide and in its place start furosemide 40 mg for 2 days and then 20 mg daily.  I saw him in follow-up on September 03, 2019.  Prior to that evaluation he was  on amlodipine 10 mg, benazepril 40 mg, furosemide 20 mg daily.  At times he does experience shortness of breath and notes swelling right leg greater than left.  He has been able to take Zetia 10 mg in addition to very low-dose rosuvastatin 5 mg once a week and is tolerating this well.  Recently he has noticed slight increase in the shortness of breath.  He admits to further weight gain.  During that evaluation with his shortness of breath spironolactone 12.5 mg was added to his medical regimen both from previous improved blood pressure control as well as potential improvement in diastolic dysfunction contributing to his exertional dyspnea.  Laboratory 1 month ago showed total cholesterol 177, HDL 40 triglycerides 182 LDL 107.  BUN 13 creatinine 0.96.  I saw him in follow-up on October 07, 2019.  Repeat chemistry profile showed a potassium of 4.5.  BUN 13 and creatinine 1.06.  Over the prior month, he has had purposeful weight loss contributed by reduction in his meal servings.   He did receive his Covid vaccination.  He did experience some nonexertional shoulder chest soreness following the vaccination.  He denies any exertional chest pain symptomatology.  During his most recent evaluation blood pressure was improved but was still 138/78 and I recommended slight additional titration of spironolactone to 25 mg daily.  I saw him in June 2021 at that time he was experiencing mild chest tightness for 3 to 4 weeks.   The symptoms are not always exertional.  He admits to decreased energy.  He has been having difficulty with reflux.  He also experiences some sharp pain discomfort going to his back which is nonexertional.  He has been on amlodipine 10 mg, benazepril 40 mg, furosemide 20 mg daily and spironolactone 25 mg daily for his blood pressure and shortness of breath.  He was no longer taking low-dose Crestor and has continued to be on Zetia.  Lipid studies on December 02, 2019 showed an LDL cholesterol that had risen to 107.  Total  cholesterol was 173 triglycerides 139.  He sees Dr. Cristina Gong who may want to do esophageal dilatation in the future.  He admitted to fatigability, no energy, nocturia at least 3-4 times per night as well as the need to take a nap every day.  During that evaluation, since he had stopped statin therapy I recommended neclizet at 180/10 which is combination bempedoic acid plus Zetia in 1 pill.  We also discussed possible sleep apnea and the importance of weight loss.  I last saw him in September 2021.  At that time he told me he could not tolerate bempedoic acid due to development of abdominal cramps and for this reason has only been on Zetia.  He continues to have GERD but is not having any anginal symptoms.  He has difficulty walking secondary to his bilateral knee replacements and hip surgery.  He continues to experience daytime sleepiness, nocturia, and admits to snoring.   He never pursued a sleep evaluation.  Presently he believes he is sleeping well and  typically sleeps between 8 and 9 hours per night.  He denies any exertional anginal symptomatology but at times notes a very sharp twinge anteriorly.  He continues to have issues with his neck and will be undergoing an ablation procedure later this week.  He has not been exercising much due to arthritic issues and he is status post bilateral knee and hip replacement surgeries.  He continues to be on amlodipine 10 mg, benazepril 40 mg, furosemide 20 mg, and spironolactone 25 mg daily.  He continues to be on ezetimibe and is tolerating this well.  He had laboratory by his primary physician in on August 10, 2020 which showed total cholesterol 190, HDL 44, LDL 113, triglycerides 214.  He presents for evaluation.  Past Medical History:  Diagnosis Date  . Benign neoplasm of colon   . Benign neoplasm of skin of upper limb, including shoulder   . Carpal tunnel syndrome   . Chest pain    a. normal cors by cath in 2015  . Diaphragmatic hernia without mention of obstruction or gangrene   . Dyslipidemia   . Elevated prostate specific antigen (PSA)   . GERD (gastroesophageal reflux disease)   . Herpes zoster without mention of complication   . History of hiatal hernia   . Hypertension   . Hypertrophy of prostate with urinary obstruction and other lower urinary tract symptoms (LUTS)   . Impotence of organic origin   . Lumbago   . Obesity, unspecified   . Osteoarthrosis, unspecified whether generalized or localized, lower leg   . Other and unspecified hyperlipidemia   . Other malaise and fatigue   . Other seborrheic keratosis   . Special screening for malignant neoplasm of prostate   . Stenosis, cervical spine   . Unspecified tinnitus     Past Surgical History:  Procedure Laterality Date  . ANTERIOR CERVICAL DECOMP/DISCECTOMY FUSION N/A 02/19/2018   Procedure: ANTERIOR CERVICAL DECOMPRESSION/DISCECTOMY FUSION - CERVICAL FOUR-CERVICAL FIVE - CERVICAL FIVE-CERVICAL SIX;  Surgeon: Earnie Larsson, MD;   Location: [facility];  Service: Neurosurgery;  Laterality: N/A;  . CARDIOVASCULAR STRESS TEST  06/15/2012   Non-diagnostic for ischemia. No lexiscan EKG changes.  . CHOLECYSTECTOMY  1992  . EYE SURGERY    . eyeband  2006   removal of right eyeband  . INGUINAL HERNIA REPAIR  1984   left  . INTRAOCULAR LENS REMOVAL  2008   right  . LEFT HEART CATHETERIZATION WITH CORONARY ANGIOGRAM N/A 09/05/2013  Procedure: LEFT HEART CATHETERIZATION WITH CORONARY ANGIOGRAM;  Surgeon: Troy Sine, MD;  Location: [facility] CATH [facility];  Service: Cardiovascular;  Laterality: N/A;  . LOWER VENOUS EXTREMITY DOPPLER  01/26/2011   No evidence of thrombus or thrombophlebitis.  Marland Kitchen RETINAL DETACHMENT SURGERY  2001   right  . TOTAL HIP ARTHROPLASTY  2012   bilateral  . TOTAL KNEE ARTHROPLASTY  2011, 2012   bilateral  . TRANSTHORACIC ECHOCARDIOGRAM  10/05/2009   EF >55%, normal LV size, systolic, and diastolic function.    Allergies  Allergen Reactions  . Lipitor [Atorvastatin] Other (See Comments)    Leg pain/cramps; statin myopathy  . Red Yeast Rice [Cholestin] Other (See Comments)    Makes legs hurt, myopathy  . Statins Other (See Comments)    "muscle break down", statin myopathy  . Codeine Other (See Comments)    Headache  . Latex Rash    Current Outpatient Medications  Medication Sig Dispense Refill  . amLODipine (NORVASC) 10 MG tablet TAKE 1 TABLET BY MOUTH EVERY DAY IN THE MORNING 90 tablet 3  . aspirin EC 81 MG tablet Take 81 mg by mouth every morning.    . benazepril (LOTENSIN) 40 MG tablet TAKE 1 TABLET BY MOUTH EVERY DAY 90 tablet 1  . cyclobenzaprine (FLEXERIL) 10 MG tablet Take 10 mg by mouth 3 (three) times daily. As needed    . dorzolamide-timolol (COSOPT) 22.3-6.8 MG/ML ophthalmic solution Place 2 drops into the right eye 2 (two) times daily.     Marland Kitchen esomeprazole (NEXIUM) 40 MG capsule TAKE 1 CAPSULE BY MOUTH AT BEDTIME. FOR REFLUX 90 capsule 1  . ezetimibe (ZETIA) 10 MG tablet TAKE 1 TABLET BY MOUTH  EVERY DAY 90 tablet 1  . finasteride (PROSCAR) 5 MG tablet TAKE 1 TABLET BY MOUTH EVERY DAY IN THE MORNING 90 tablet 1  . furosemide (LASIX) 40 MG tablet TAKE 1/2 TABLET BY MOUTH DAILY 90 tablet 2  . meloxicam (MOBIC) 15 MG tablet Take 15 mg by mouth daily.    . Polyvinyl Alcohol-Povidone (REFRESH OP) Place 1 drop into the right eye daily as needed (dry eyes/irritation).    Marland Kitchen spironolactone (ALDACTONE) 25 MG tablet Take 1 tablet (25 mg total) by mouth daily. Take 13m daily 90 tablet 3   No current facility-administered medications for this visit.    Social History   Socioeconomic History  . Marital status: Married    Spouse name: Not on file  . Number of children: Not on file  . Years of education: Not on file  . Highest education level: Not on file  Occupational History  . 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 Smoking status: Never Smoker  . Smokeless tobacco: Never Used  Vaping Use  . Vaping Use: Never used  Substance and Sexual Activity  . Alcohol use: No  . Drug use: No  . Sexual activity: Yes    Partners: Female    Comment: wife  Other Topics Concern  . Not on file  Social History Narrative   Married   Never smoked   Alcohol none   Exercise -gym 3 days a week (join 3 months ago)   Living Will   Social Determinants of Health   Financial Resource Strain: Not on file  Food Insecurity: Not on file  Transportation Needs: Not on file  Physical Activity: Not on file  Stress: Not on file  Social Connections: Not on file  Intimate  Partner Violence: Not on file   Social is notable that he is married has 2 children and 2 grandchildren. He is not routinely exercise. There is no tobacco use. He does drink occasional alcohol.  Family History  Problem Relation Age of Onset  . Diabetes Mother   . Arrhythmia Mother   . Hypertension Mother   . Dementia Father   . Pulmonary embolism Father   . Hypertension Father   . Cancer  Sister        Liver cancer - Histocytic lymphoma  . Cancer Sister 47       Skin cancer  . Hypertension Sister 23  . Diabetes Sister   . Stroke Brother   . Diabetes Brother   . Hypertension Brother   . Cancer Daughter   . Cancer Maternal Grandmother        Skin cancer  . Heart attack Maternal Grandfather   . Cancer Paternal Grandfather   . Hypertension Paternal Grandfather     ROS General: Negative; No fevers, chills, or night sweats;  positive for a 16 pound weight loss in September 2018.  Positive for fatigue HEENT: Negative; No changes in vision or hearing, sinus congestion, difficulty swallowing Pulmonary: Negative; No cough, wheezing, shortness of breath, hemoptysis Cardiovascular: Positive for occasional exertional shortness of breath.  No exertional chest pain.  Positive for right leg greater than left swelling GI: Positive for GERD  GU: Positive for nocturia; BPH Musculoskeletal: Lateral hip and knee discomfort; status post recent surgery C4-5/C5-6 Hematologic/Oncology: Negative; no easy bruising, bleeding Endocrine: Negative; no heat/cold intolerance; no diabetes Neuro: Mild left arm paresthesias from his cervical degenerative disease Skin: Negative; No rashes or skin lesions Psychiatric: Negative; No behavioral problems, depression Sleep: Negative; No snoring, daytime sleepiness, hypersomnolence, bruxism, restless legs, hypnogognic hallucinations, no cataplexy Other comprehensive 14 point system review is negative.  PE BP 110/60 (BP Location: Left Arm, Patient Position: Sitting)   Pulse 74   Ht _0  (1.778 m)   Wt 284 lb (128.8 kg)   BMI 40.75 kg/m    Repeat blood pressure by me was 122/70  Wt Readings from Last 3 Encounters:  09/28/20 284 lb (128.8 kg)  08/13/20 282 lb 11.2 oz (128.2 kg)  05/15/20 274 lb (124.3 kg)   General: Alert, oriented, no distress.  Skin: normal turgor, no rashes, warm and dry HEENT: Normocephalic, atraumatic. Pupils equal round  and reactive to light; sclera anicteric; extraocular muscles intact;  Nose without nasal septal hypertrophy Mouth/Parynx benign; Mallinpatti scale 3/4 Neck: Thick neck; no JVD, no carotid bruits; normal carotid upstroke Lungs: clear to ausculatation and percussion; no wheezing or rales Chest wall: without tenderness to palpitation Heart: PMI not displaced, RRR, s1 s2 normal, 1/6 systolic murmur, no diastolic murmur, no rubs, gallops, thrills, or heaves Abdomen: Central adiposity soft, nontender; no hepatosplenomehaly, BS+; abdominal aorta nontender and not dilated by palpation. Back: no CVA tenderness Pulses 2+ Musculoskeletal: full range of motion, normal strength, no joint deformities Extremities: no clubbing cyanosis or edema, Homan's sign negative  Neurologic: grossly nonfocal; Cranial nerves grossly wnl Psychologic: Normal mood and affect  ECG (independently read by me): Normal sinus rhythm at 74 bpm.  No ectopy.  Normal intervals.  September 2021 ECG (independently read by me): Normal sinus rhythm at 82 bpm.  No significant ST-T change.  Normal intervals.  No ectopy  June 2021 ECG (independently read by me): NSR at 73;  No ectopy, no ST changes, normal intervals  January 12,2021 ECG (independently read  by me): Normal sinus rhythm at 81 bpm.  No ectopy.  Normal intervals.  July 2020 ECG (independently read by me): NSR at 70  January 2020 ECG (independently read by me): Normal sinus rhythm at 74 bpm.  No ectopy.  No ST segment changes.  April 2019 ECG (independently read by me): Normal sinus rhythm at 66 bpm.  Normal intervals.  No ectopy.  No ST segment changes.  August 28, 2017 ECG (independently read by me): Normal sinus rhythm at 63 bpm.  Normal intervals.  No ST segment changes.  February 2015 ECG (independently read by me): Normal sinus rhythm at 66 beats per minute. PR interval 160 ms. QTC intervals 34 ms. No significant ST-T changes.  November 2014 ECG (independently read  by me)::Normal sinus rhythm at 69 beats per minute. No ectopy. Normal intervals.  LABS:  BMP Latest Ref Rng & Units 03/30/2020 12/02/2019 11/25/2019  Glucose 65 - 99 mg/dL 105(H) 98 107(H)  BUN 7 - 25 mg/dL _0 Creatinine 0.70 - 1.18 mg/dL 1.01 0.95 0.96  BUN/Creat Ratio 6 - 22 (calc) NOT APPLICABLE 15 NOT APPLICABLE  Sodium 720 - 146 mmol/L 141 142 143  Potassium 3.5 - 5.3 mmol/L 4.3 4.6 4.1  Chloride 98 - 110 mmol/L 108 106 108  CO2 20 - 32 mmol/L _1 Calcium 8.6 - 10.3 mg/dL 9.2 9.1 9.5   CBC Latest Ref Rng & Units 11/25/2019 07/22/2019 02/19/2018  WBC 3.8 - 10.8 Thousand/uL 7.8 7.0 6.7  Hemoglobin 13.2 - 17.1 g/dL 16.1 15.9 14.6  Hematocrit 38.5 - 50.0 % 47.5 47.1 43.0  Platelets 140 - 400 Thousand/uL 220 205 188   Lab Results  Component Value Date   TSH 3.27 11/25/2019    Lab Results  Component Value Date   HGBA1C 5.3 03/30/2020   Hepatic Function Latest Ref Rng & Units 12/02/2019 11/25/2019 07/22/2019  Total Protein 6.0 - 8.5 g/dL 5.5(L) 5.9(L) 5.6(L)  Albumin 3.8 - 4.8 g/dL 4.1 - -  AST 0 - 40 IU/L _2 ALT 0 - 44 IU/L _3 Alk Phosphatase 39 - 117 IU/L 98 - -  Total Bilirubin 0.0 - 1.2 mg/dL 0.3 0.5 0.6   Lipid Panel     Component Value Date/Time   CHOL 190 08/10/2020 0806   CHOL 173 12/02/2019 0844   CHOL 191 07/22/2013 0835   TRIG 214 (H) 08/10/2020 0806   TRIG 253 (H) 07/22/2013 0835   HDL 44 08/10/2020 0806   HDL 41 12/02/2019 0844   HDL 38 (L) 07/22/2013 0835   CHOLHDL 4.3 08/10/2020 0806   VLDL 35 (H) 10/27/2016 0825   LDLCALC 113 (H) 08/10/2020 0806   LDLCALC 102 (H) 07/22/2013 9470    RADIOLOGY: No results found.  IMPRESSION:  1. Essential hypertension   2. Atherosclerosis of native coronary artery of native heart without angina pectoris   3. Grade II diastolic dysfunction   4. Hyperlipidemia with target LDL less than 70   5. Statin intolerance   6. Morbid obesity ([place])     ASSESSMENT AND PLAN: Mr. Kurka is a 70 year-old  gentleman who has a history of obesity, hyperlipidemia, hypertension, and GERD.  He had developed chest discomfort 2015 which ultimately led to definitive cardiac catheterization which revealed normal coronary arteries.  He  underwent successful surgery by Anthony [name] and tolerated this well from a cardiac standpoint.  He had developed some episodes of chest pain  which most likely were of musculoskeletal etiology.  A CTA in 2020 showed minimal coronary plaque involving his LAD ostium and OM1 vessel.  He also was noted to have atherosclerosis of his thoracic aorta.  Presently, he is not having any anginal symptoms.  Since I last saw him he has had increased weight and is now in the morbid obese category.  His blood pressure today is stable on his regimen consisting of amlodipine 10 mg, benazepril 40 mg, furosemide 20 mg daily in addition to spironolactone.  He has been intolerant to statins and developed CPK elevation with prior high-dose statin therapy.  He did not tolerate bempedoic acid.  He continues to be on Zetia.  His most recent lipid studies show a mixed pattern.  I have suggested over-the-counter fish oil to take in addition to his Zetia.  He will be undergoing an ablation procedure by Anthony [name] to his neck later this week.  We again discussed his sleep.  He believes he is sleeping well and admits to at least 8 to 9 hours of sleep per night.  At present he would like to defer sleep evaluation.  We again discussed improved diet.  He will follow-up with his primary physician.  I will see him in 1 year for cardiology reevaluation or sooner as needed.   Troy Sine, MD, [facility]  09/28/2020 8:43 AM

## 2020-09-28 NOTE — Patient Instructions (Signed)

## 2020-10-07 DIAGNOSIS — M47812 Spondylosis without myelopathy or radiculopathy, cervical region: Secondary | ICD-10-CM | POA: Diagnosis not present

## 2020-10-07 DIAGNOSIS — I10 Essential (primary) hypertension: Secondary | ICD-10-CM | POA: Diagnosis not present

## 2020-10-07 DIAGNOSIS — Z6841 Body Mass Index (BMI) 40.0 and over, adult: Secondary | ICD-10-CM | POA: Diagnosis not present

## 2020-10-12 ENCOUNTER — Encounter: Payer: Self-pay | Admitting: Internal Medicine

## 2020-11-16 DIAGNOSIS — H40053 Ocular hypertension, bilateral: Secondary | ICD-10-CM | POA: Diagnosis not present

## 2020-11-16 DIAGNOSIS — Z79899 Other long term (current) drug therapy: Secondary | ICD-10-CM | POA: Diagnosis not present

## 2020-11-16 DIAGNOSIS — Z20822 Contact with and (suspected) exposure to covid-19: Secondary | ICD-10-CM | POA: Diagnosis not present

## 2020-11-16 DIAGNOSIS — R079 Chest pain, unspecified: Secondary | ICD-10-CM | POA: Diagnosis not present

## 2020-11-16 DIAGNOSIS — Z5181 Encounter for therapeutic drug level monitoring: Secondary | ICD-10-CM | POA: Diagnosis not present

## 2020-11-19 ENCOUNTER — Other Ambulatory Visit: Payer: Self-pay | Admitting: Family Medicine

## 2020-11-19 DIAGNOSIS — N401 Enlarged prostate with lower urinary tract symptoms: Secondary | ICD-10-CM

## 2020-11-19 DIAGNOSIS — R350 Frequency of micturition: Secondary | ICD-10-CM

## 2020-11-24 NOTE — Telephone Encounter (Signed)
Recommend F/U appointment

## 2020-11-26 NOTE — Progress Notes (Signed)
Cardiology Clinic Note   Patient Name: Anthony [name]. Date of Encounter: 11/27/2020  Primary Care Provider:  Wardell Honour, MD Primary Cardiologist:  Shelva Majestic, MD  Patient Profile    Anthony [name]. [place] 70 year old male presents the clinic today for evaluation of his chest pain.  Past Medical History    Past Medical History:  Diagnosis Date  . Benign neoplasm of colon   . Benign neoplasm of skin of upper limb, including shoulder   . Carpal tunnel syndrome   . Chest pain    a. normal cors by cath in 2015  . Diaphragmatic hernia without mention of obstruction or gangrene   . Dyslipidemia   . Elevated prostate specific antigen (PSA)   . GERD (gastroesophageal reflux disease)   . Herpes zoster without mention of complication   . History of hiatal hernia   . Hypertension   . Hypertrophy of prostate with urinary obstruction and other lower urinary tract symptoms (LUTS)   . Impotence of organic origin   . Lumbago   . Obesity, unspecified   . Osteoarthrosis, unspecified whether generalized or localized, lower leg   . Other and unspecified hyperlipidemia   . Other malaise and fatigue   . Other seborrheic keratosis   . Special screening for malignant neoplasm of prostate   . Stenosis, cervical spine   . Unspecified tinnitus    Past Surgical History:  Procedure Laterality Date  . ANTERIOR CERVICAL DECOMP/DISCECTOMY FUSION N/A 02/19/2018   Procedure: ANTERIOR CERVICAL DECOMPRESSION/DISCECTOMY FUSION - CERVICAL FOUR-CERVICAL FIVE - CERVICAL FIVE-CERVICAL SIX;  Surgeon: Earnie Larsson, MD;  Location: [place];  Service: Neurosurgery;  Laterality: N/A;  . CARDIOVASCULAR STRESS TEST  06/15/2012   Non-diagnostic for ischemia. No lexiscan EKG changes.  . CHOLECYSTECTOMY  1992  . EYE SURGERY    . eyeband  2006   removal of right eyeband  . INGUINAL HERNIA REPAIR  1984   left  . INTRAOCULAR LENS REMOVAL  2008   right  . LEFT HEART CATHETERIZATION WITH CORONARY ANGIOGRAM N/A  09/05/2013   Procedure: LEFT HEART CATHETERIZATION WITH CORONARY ANGIOGRAM;  Surgeon: Troy Sine, MD;  Location: [facility] CATH LAB;  Service: Cardiovascular;  Laterality: N/A;  . LOWER VENOUS EXTREMITY DOPPLER  01/26/2011   No evidence of thrombus or thrombophlebitis.  Marland Kitchen RETINAL DETACHMENT SURGERY  2001   right  . TOTAL HIP ARTHROPLASTY  2012   bilateral  . TOTAL KNEE ARTHROPLASTY  2011, 2012   bilateral  . TRANSTHORACIC ECHOCARDIOGRAM  10/05/2009   EF >55%, normal LV size, systolic, and diastolic function.    Allergies  Allergies  Allergen Reactions  . Lipitor [Atorvastatin] Other (See Comments)    Leg pain/cramps; statin myopathy  . Red Yeast Rice [Cholestin] Other (See Comments)    Makes legs hurt, myopathy  . Statins Other (See Comments)    "muscle break down", statin myopathy  . Codeine Other (See Comments)    Headache  . Latex Rash    History of Present Illness    Mr. Rho has a PMH of hypertension, dysphagia, esophageal stenosis, GERD, acid reflux, DJD, BPH, mixed hyperlipidemia, morbid obesity, clinical depression, and chest pain.  He underwent nuclear stress test 10/13 for chest pain which showed normal perfusion without scar or ischemia.  An echocardiogram 1/15 showed an ejection fraction of 55-60%, G1 DD mild left ventricular hypertrophy, mild mitral annular calcification with mildly thickened leaflets and trivial mitral regurgitation.  It was felt that his chest pain was  most likely musculoskeletal.  A CTA in 2020 showed minimal coronary plaque involving his LAD ostium and OM 1.  He was also noted to have atherosclerosis of his thoracic aorta.  He was seen by Dr. Claiborne Billings 6/21 with mild chest tightness for 3 to 4 weeks.  He reported symptoms were not always with exertion.  He reported decreased energy.  He was having difficulty with his reflux.  He also reported some sharp pain and discomfort which was going to his back and was nonexertional.  He was seen Dr. Cristina Gong for  esophageal dilation in the future.  He reported he had stopped his statin therapy and Nexlizet was recommended.  Weight loss and OSA were also discussed.  He was last seen and evaluated by Dr. Claiborne Billings on 09/28/2020.  He had not pursued sleep evaluation.  He felt he was sleeping well and reported typically sleeping 8 to 9 hours per night.  He denied any exertion symptomology but did note very sharp twinges anteriorly at times.  He continued to have issues with his neck and had an ablation procedure planned.  He remains sedentary due to arthritic issues and post bilateral knee and hip replacement surgeries.  He was tolerating his ezetimibe well.  Lipid panel showed LDL of 113.  Fish oil was recommended.  Improving his diet was discussed.  Follow-up was planned for 1 year and as needed.  He presented to [facility] ED 11/16/2020.  He reported several days of chest pain with no associated nausea or vomiting.  He denied shortness of breath.  His blood pressure at that time was 161/84 with pulse of 74.  He reported that the pain was in the center of his chest, radiated to his jaw and down his right arm.  He also had shortness of breath and headache.  His lab work was unremarkable.  Chest x-ray showed no acute abnormalities.  EKG showed normal sinus rhythm.  High-sensitivity troponins negative.  He presents the clinic today for follow-up evaluation states he was at a [facility] visit at [facility] when they insisted he present to the emergency department for further evaluation.  He reports that he was having some pressure across his chest that he felt was related to reflux.  He has not had any further episodes of chest pain.  He brings blood pressure log today which shows average blood pressures in the 150s over 80s at home.  His blood pressure in the office today is well controlled at 130/78.  I will add metoprolol tartrate 25 mg twice daily to his medication regimen, have him maintain a blood pressure log, give him the salty 6 diet  sheet, and have him increase medical activity as tolerated.  We will have him follow-up in 1 month.  Today he denies chest pain, shortness of breath, lower extremity edema, fatigue, palpitations, melena, hematuria, hemoptysis, diaphoresis, weakness, presyncope, syncope, orthopnea, and PND.   Home Medications    Prior to Admission medications   Medication Sig Start Date End Date Taking? Authorizing Provider  amLODipine (NORVASC) 10 MG tablet TAKE 1 TABLET BY MOUTH EVERY DAY IN THE MORNING 07/28/20   Troy Sine, MD  aspirin EC 81 MG tablet Take 81 mg by mouth every morning.    [provider]  benazepril (LOTENSIN) 40 MG tablet TAKE 1 TABLET BY MOUTH EVERY DAY 09/21/20   Reed, Tiffany L, DO  cyclobenzaprine (FLEXERIL) 10 MG tablet Take 10 mg by mouth 3 (three) times daily. As needed 05/10/20  [provider]  dorzolamide-timolol (COSOPT) 22.3-6.8 MG/ML ophthalmic solution Place 2 drops into the right eye 2 (two) times daily.     [provider]  esomeprazole (NEXIUM) 40 MG capsule TAKE 1 CAPSULE BY MOUTH AT BEDTIME. FOR REFLUX 09/21/20   Reed, Tiffany L, DO  ezetimibe (ZETIA) 10 MG tablet TAKE 1 TABLET BY MOUTH EVERY DAY 07/27/20   Reed, Tiffany L, DO  finasteride (PROSCAR) 5 MG tablet TAKE 1 TABLET BY MOUTH EVERY DAY IN THE MORNING 06/02/20   Reed, Tiffany L, DO  furosemide (LASIX) 40 MG tablet TAKE 1/2 TABLET BY MOUTH DAILY 08/04/20   Troy Sine, MD  meloxicam (MOBIC) 15 MG tablet Take 15 mg by mouth daily. 01/14/20   [provider]  Polyvinyl Alcohol-Povidone (REFRESH OP) Place 1 drop into the right eye daily as needed (dry eyes/irritation).    [provider]  spironolactone (ALDACTONE) 25 MG tablet Take 1 tablet (25 mg total) by mouth daily. Take 25mg  daily 10/07/19   Troy Sine, MD    Family History    Family History  Problem Relation Age of Onset  . Diabetes Mother   . Arrhythmia Mother   . Hypertension Mother   . Dementia  Father   . Pulmonary embolism Father   . Hypertension Father   . Cancer Sister        Liver cancer - Histocytic lymphoma  . Cancer Sister 11       Skin cancer  . Hypertension Sister 19  . Diabetes Sister   . Stroke Brother   . Diabetes Brother   . Hypertension Brother   . Cancer Daughter   . Cancer Maternal Grandmother        Skin cancer  . Heart attack Maternal Grandfather   . Cancer Paternal Grandfather   . Hypertension Paternal Grandfather    He indicated that his mother is deceased. He indicated that his father is deceased. He indicated that only one of his two sisters is alive. He indicated that his brother is deceased. He indicated that his maternal grandmother is deceased. He indicated that his maternal grandfather is deceased. He indicated that his paternal grandmother is deceased. He indicated that his paternal grandfather is deceased. He indicated that his daughter is alive. He indicated that his son is alive.  Social History    Social History   Socioeconomic History  . Marital status: Married    Spouse name: Not on file  . Number of children: Not on file  . Years of education: Not on file  . Highest education level: Not on file  Occupational History  . Occupation: retired Medical sales representative     Comment: Hospital doctor  Tobacco Use  . Smoking status: Never Smoker  . Smokeless tobacco: Never Used  Vaping Use  . Vaping Use: Never used  Substance and Sexual Activity  . Alcohol use: No  . Drug use: No  . Sexual activity: Yes    Partners: Female    Comment: wife  Other Topics Concern  . Not on file  Social History Narrative   Married   Never smoked   Alcohol none   Exercise -gym 3 days a week (join 3 months ago)   Living Will   Social Determinants of Health   Financial Resource Strain: Not on file  Food Insecurity: Not on file  Transportation Needs: Not on file  Physical Activity: Not on file  Stress: Not on file  Social Connections: Not on  file   Intimate Partner Violence: Not on file     Review of Systems    General:  No chills, fever, night sweats or weight changes.  Cardiovascular:  No chest pain, dyspnea on exertion, edema, orthopnea, palpitations, paroxysmal nocturnal dyspnea. Dermatological: No rash, lesions/masses Respiratory: No cough, dyspnea Urologic: No hematuria, dysuria Abdominal:   No nausea, vomiting, diarrhea, bright red blood per rectum, melena, or hematemesis Neurologic:  No visual changes, wkns, changes in mental status. All other systems reviewed and are otherwise negative except as noted above.  Physical Exam    VS:  BP 130/78 (BP Location: Right Arm, Patient Position: Sitting, Cuff Size: Large)   Pulse 84   Ht 5\' 11"  (1.803 m)   Wt 284 lb 12.8 oz (129.2 kg)   BMI 39.72 kg/m  , BMI Body mass index is 39.72 kg/m. GEN: Well nourished, well developed, in no acute distress. HEENT: normal. Neck: Supple, no JVD, carotid bruits, or masses. Cardiac: RRR, no murmurs, rubs, or gallops. No clubbing, cyanosis, edema.  Radials/DP/PT 2+ and equal bilaterally.  Respiratory:  Respirations regular and unlabored, clear to auscultation bilaterally. GI: Soft, nontender, nondistended, BS + x 4. MS: no deformity or atrophy. Skin: warm and dry, no rash. Neuro:  Strength and sensation are intact. Psych: Normal affect.  Accessory Clinical Findings    Recent Labs: 12/02/2019: ALT 19 03/30/2020: BUN 17; Creat 1.01; Potassium 4.3; Sodium 141   Recent Lipid Panel    Component Value Date/Time   CHOL 190 08/10/2020 0806   CHOL 173 12/02/2019 0844   CHOL 191 07/22/2013 0835   TRIG 214 (H) 08/10/2020 0806   TRIG 253 (H) 07/22/2013 0835   HDL 44 08/10/2020 0806   HDL 41 12/02/2019 0844   HDL 38 (L) 07/22/2013 0835   CHOLHDL 4.3 08/10/2020 0806   VLDL 35 (H) 10/27/2016 0825   LDLCALC 113 (H) 08/10/2020 0806   LDLCALC 102 (H) 07/22/2013 0835    ECG personally reviewed by me today-normal sinus rhythm cannot rule out  inferior infarct undetermined age 71 bpm- No acute changes  Echocardiogram 08/30/2018  Study Conclusions   - Left ventricle: The cavity size was normal. There was mild  concentric hypertrophy. Systolic function was normal. The  estimated ejection fraction was in the range of 60% to 65%. Wall  motion was normal; there were no regional wall motion  abnormalities. Features are consistent with a pseudonormal left  ventricular filling pattern, with concomitant abnormal relaxation  and increased filling pressure (grade 2 diastolic dysfunction).  Doppler parameters are consistent with elevated ventricular  end-diastolic filling pressure.  - Aortic valve: There was no regurgitation.  - Mitral valve: There was mild regurgitation.  - Left atrium: The atrium was moderately dilated.  - Right ventricle: The cavity size was mildly dilated. Wall  thickness was normal. Systolic function was normal.  - Right atrium: The atrium was normal in size.  - Tricuspid valve: There was mild regurgitation.  - Pulmonic valve: There was mild regurgitation.  - Pulmonary arteries: Systolic pressure was within the normal  range. PA peak pressure: 35 mm Hg (S).  - Inferior vena cava: The vessel was normal in size.  - Pericardium, extracardiac: There was no pericardial effusion.  Coronary CTA 09/14/2018 IMPRESSION: 1. Coronary calcium score of 26.4. This was 28th percentile for age and sex matched control.  2. Normal coronary origin with right dominance.  3. Minimal plaque in ostial LAD and OM1.  Skeet Latch, MD  Assessment & Plan  1.  Chest pain-no chest pain today.  Presented to [facility] emergency department on 11/16/2020.  High-sensitivity troponins were low and flat.  EKG showed no ischemic changes.  Chest x-ray showed no acute abnormalities.  Labs were unremarkable.  Underwent coronary CTA 09/14/2018 which showed calcium score of 26.4 and minimal plaque in his ostial LAD and OM1.  His  chest discomfort was previously felt to be related to musculoskeletal issues. Continue amlodipine, aspirin Heart healthy low-sodium diet-salty 6 given Increase physical activity as tolerated  Essential hypertension-BP today 130/78.  Well-controlled at home. Continue amlodipine, benazepril,, furosemide, spironolactone Heart healthy low-sodium diet-salty 6 given Increase physical activity as tolerated Continue weight loss Start metoprolol tartrate 25 mg twice daily Good morning Hyperlipidemia-08/10/2020: Cholesterol 190; HDL 44; LDL Cholesterol (Calc) 113; Triglycerides 214.  Statin intolerant. Continue Zetia, fish oil Heart healthy low-sodium high-fiber diet Increase physical activity as tolerated  Morbid obesity-weight today 284lbs. Continue weight loss Increase physical activity as tolerated Heart healthy low-sodium diet  Disposition: Follow-up with Dr. Claiborne Billings in 1 months.    Jossie Ng. Oz Gammel NP-C    11/27/2020, 9:13 AM [address] Office [phone] Fax [phone]  Notice: This dictation was prepared with Dragon dictation along with smaller phrase technology. Any transcriptional errors that result from this process are unintentional and may not be corrected upon review.  I spent 15 minutes examining this patient, reviewing medications, and using patient centered shared decision making involving her cardiac care.  Prior to her visit I spent greater than 20 minutes reviewing her past medical history,  medications, and prior cardiac tests.

## 2020-11-27 ENCOUNTER — Other Ambulatory Visit: Payer: Self-pay

## 2020-11-27 ENCOUNTER — Encounter: Payer: Self-pay | Admitting: General Practice

## 2020-11-27 ENCOUNTER — Ambulatory Visit: Payer: PPO | Admitting: General Practice

## 2020-11-27 VITALS — BP 130/78 | HR 84 | Ht 71.0 in | Wt 284.8 lb

## 2020-11-27 DIAGNOSIS — I10 Essential (primary) hypertension: Secondary | ICD-10-CM

## 2020-11-27 DIAGNOSIS — E785 Hyperlipidemia, unspecified: Secondary | ICD-10-CM

## 2020-11-27 DIAGNOSIS — R079 Chest pain, unspecified: Secondary | ICD-10-CM | POA: Diagnosis not present

## 2020-11-27 MED ORDER — METOPROLOL TARTRATE 25 MG PO TABS: 25.0000 mg | ORAL_TABLET | Freq: Two times a day (BID) | ORAL | 3 refills | Status: DC

## 2020-11-27 NOTE — Patient Instructions (Signed)
Medication Instructions:  START METOPROLOL TARTRATE 25MG  TWICE DAILY *If you need a refill on your cardiac medications before your next appointment, please call your pharmacy*  Special Instructions PLEASE READ AND FOLLOW SALTY 6-ATTACHED-1,800mg  daily  PLEASE INCREASE PHYSICAL ACTIVITY AS TOLERATED  Follow-Up: Your next appointment:  1 month(s) In Person with Shelva Majestic, MD OR IF UNAVAILABLE Lee, FNP-C  At [facility], you and your health needs are our priority.  As part of our continuing mission to provide you with exceptional heart care, we have created designated Provider Care Teams.  These Care Teams include your primary Cardiologist (physician) and Advanced Practice Providers (APPs -  Physician Assistants and Nurse Practitioners) who all work together to provide you with the care you need, when you need it.            6 SALTY THINGS TO AVOID     1,800MG  DAILY

## 2020-12-08 ENCOUNTER — Other Ambulatory Visit: Payer: PPO

## 2020-12-11 ENCOUNTER — Other Ambulatory Visit: Payer: Self-pay | Admitting: Cardiovascular Disease

## 2020-12-11 DIAGNOSIS — E785 Hyperlipidemia, unspecified: Secondary | ICD-10-CM

## 2020-12-11 DIAGNOSIS — R0600 Dyspnea, unspecified: Secondary | ICD-10-CM

## 2020-12-11 DIAGNOSIS — I5189 Other ill-defined heart diseases: Secondary | ICD-10-CM

## 2020-12-11 DIAGNOSIS — R0609 Other forms of dyspnea: Secondary | ICD-10-CM

## 2020-12-11 DIAGNOSIS — I2510 Atherosclerotic heart disease of native coronary artery without angina pectoris: Secondary | ICD-10-CM

## 2020-12-11 DIAGNOSIS — I10 Essential (primary) hypertension: Secondary | ICD-10-CM

## 2020-12-14 ENCOUNTER — Ambulatory Visit: Payer: PPO | Admitting: Internal Medicine

## 2020-12-25 NOTE — Progress Notes (Signed)
Cardiology Clinic Note   Patient Name: Anthony [name]. Date of Encounter: 12/28/2020  Primary Care Provider:  Wardell Honour, MD Primary Cardiologist:  Shelva Majestic, MD  Patient Profile    Anthony [name] 70 year old male presents the clinic today for follow-up evaluation of his chest pain.  Past Medical History    Past Medical History:  Diagnosis Date  . Benign neoplasm of colon   . Benign neoplasm of skin of upper limb, including shoulder   . Carpal tunnel syndrome   . Chest pain    a. normal cors by cath in 2015  . Diaphragmatic hernia without mention of obstruction or gangrene   . Dyslipidemia   . Elevated prostate specific antigen (PSA)   . GERD (gastroesophageal reflux disease)   . Herpes zoster without mention of complication   . History of hiatal hernia   . Hypertension   . Hypertrophy of prostate with urinary obstruction and other lower urinary tract symptoms (LUTS)   . Impotence of organic origin   . Lumbago   . Obesity, unspecified   . Osteoarthrosis, unspecified whether generalized or localized, lower leg   . Other and unspecified hyperlipidemia   . Other malaise and fatigue   . Other seborrheic keratosis   . Special screening for malignant neoplasm of prostate   . Stenosis, cervical spine   . Unspecified tinnitus    Past Surgical History:  Procedure Laterality Date  . ANTERIOR CERVICAL DECOMP/DISCECTOMY FUSION N/A 02/19/2018   Procedure: ANTERIOR CERVICAL DECOMPRESSION/DISCECTOMY FUSION - CERVICAL FOUR-CERVICAL FIVE - CERVICAL FIVE-CERVICAL SIX;  Surgeon: Earnie Larsson, MD;  Location: [facility];  Service: Neurosurgery;  Laterality: N/A;  . CARDIOVASCULAR STRESS TEST  06/15/2012   Non-diagnostic for ischemia. No lexiscan EKG changes.  . CHOLECYSTECTOMY  1992  . EYE SURGERY    . eyeband  2006   removal of right eyeband  . INGUINAL HERNIA REPAIR  1984   left  . INTRAOCULAR LENS REMOVAL  2008   right  . LEFT HEART CATHETERIZATION WITH CORONARY ANGIOGRAM  N/A 09/05/2013   Procedure: LEFT HEART CATHETERIZATION WITH CORONARY ANGIOGRAM;  Surgeon: Troy Sine, MD;  Location: [facility] CATH LAB;  Service: Cardiovascular;  Laterality: N/A;  . LOWER VENOUS EXTREMITY DOPPLER  01/26/2011   No evidence of thrombus or thrombophlebitis.  Marland Kitchen RETINAL DETACHMENT SURGERY  2001   right  . TOTAL HIP ARTHROPLASTY  2012   bilateral  . TOTAL KNEE ARTHROPLASTY  2011, 2012   bilateral  . TRANSTHORACIC ECHOCARDIOGRAM  10/05/2009   EF >55%, normal LV size, systolic, and diastolic function.    Allergies  Allergies  Allergen Reactions  . Lipitor [Atorvastatin] Other (See Comments)    Leg pain/cramps; statin myopathy  . Red Yeast Rice [Cholestin] Other (See Comments)    Makes legs hurt, myopathy  . Statins Other (See Comments)    "muscle break down", statin myopathy  . Codeine Other (See Comments)    Headache  . Latex Rash    History of Present Illness    Mr. Richison has a PMH of hypertension, dysphagia, esophageal stenosis, GERD, acid reflux, DJD, BPH, mixed hyperlipidemia, morbid obesity, clinical depression, and chest pain.  He underwent nuclear stress test 10/13 for chest pain which showed normal perfusion without scar or ischemia.  An echocardiogram 1/15 showed an ejection fraction of 55-60%, G1 DD mild left ventricular hypertrophy, mild mitral annular calcification with mildly thickened leaflets and trivial mitral regurgitation.  It was felt that his chest pain  was most likely musculoskeletal.  A CTA in 2020 showed minimal coronary plaque involving his LAD ostium and OM 1.  He was also noted to have atherosclerosis of his thoracic aorta.  He was seen by Dr. Claiborne Billings 6/21 with mild chest tightness for 3 to 4 weeks.  He reported symptoms were not always with exertion.  He reported decreased energy.  He was having difficulty with his reflux.  He also reported some sharp pain and discomfort which was going to his back and was nonexertional.  He was seen Dr. Cristina Gong for  esophageal dilation in the future.  He reported he had stopped his statin therapy and Nexlizet was recommended.  Weight loss and OSA were also discussed.  He was last seen and evaluated by Dr. Claiborne Billings on 09/28/2020.  He had not pursued sleep evaluation.  He felt he was sleeping well and reported typically sleeping 8 to 9 hours per night.  He denied any exertion symptomology but did note very sharp twinges anteriorly at times.  He continued to have issues with his neck and had an ablation procedure planned.  He remains sedentary due to arthritic issues and post bilateral knee and hip replacement surgeries.  He was tolerating his ezetimibe well.  Lipid panel showed LDL of 113.  Fish oil was recommended.  Improving his diet was discussed.  Follow-up was planned for 1 year and as needed.  He presented to [facility] ED 11/16/2020.  He reported several days of chest pain with no associated nausea or vomiting.  He denied shortness of breath.  His blood pressure at that time was 161/84 with pulse of 74.  He reported that the pain was in the center of his chest, radiated to his jaw and down his right arm.  He also had shortness of breath and headache.  His lab work was unremarkable.  Chest x-ray showed no acute abnormalities.  EKG showed normal sinus rhythm.  High-sensitivity troponins negative.  He presented to the clinic 4/8/ 22 for follow-up evaluation stated he was at the [facility] visit at [facility] when they insisted he present to the emergency department for further evaluation.  He reported that he was having some pressure across his chest that he felt was related to reflux.  He had not had any further episodes of chest pain.  He brought blood pressure log  which showed average blood pressures in the 150s over 80s at home.  His blood pressure in the office was well controlled at 130/78.  I  added metoprolol tartrate 25 mg twice daily to his medication regimen, asked him to maintain a blood pressure log, gave him the salty  6 diet sheet, and had him increase physical activity as tolerated.  We planned follow-up in 1 month.  He presents to the clinic today for follow-up evaluation states he feels fairly well today.  He has noticed increased work of breathing with increased physical activity.  He has also had a 5.5 pound weight increase since his visit 1 month ago.  He reports increased lower extremity edema.  He also reports dietary indiscretion.  I will give him the salty 6 diet sheet, have him increase physical activity as tolerated, increase his Lasix to 40 mg daily, change his metoprolol to succinate 25 daily, order a BMP in 1 week and have him follow-up in 1 month.  Today he denies chest pain, shortness of breath, fatigue, palpitations, melena, hematuria, hemoptysis, diaphoresis, weakness, presyncope, syncope, orthopnea, and PND.  Home Medications  Prior to Admission medications   Medication Sig Start Date End Date Taking? Authorizing Provider  amLODipine (NORVASC) 10 MG tablet TAKE 1 TABLET BY MOUTH EVERY DAY IN THE MORNING 07/28/20   Troy Sine, MD  aspirin EC 81 MG tablet Take 81 mg by mouth every morning.    [provider]  benazepril (LOTENSIN) 40 MG tablet TAKE 1 TABLET BY MOUTH EVERY DAY 09/21/20   Reed, Tiffany L, DO  brimonidine (ALPHAGAN P) 0.1 % SOLN     [provider]  cyclobenzaprine (FLEXERIL) 10 MG tablet Take 10 mg by mouth 3 (three) times daily. As needed 05/10/20   [provider]  dorzolamide-timolol (COSOPT) 22.3-6.8 MG/ML ophthalmic solution Place 2 drops into the right eye 2 (two) times daily.     [provider]  esomeprazole (NEXIUM) 40 MG capsule TAKE 1 CAPSULE BY MOUTH AT BEDTIME. FOR REFLUX 09/21/20   Reed, Tiffany L, DO  ezetimibe (ZETIA) 10 MG tablet TAKE 1 TABLET BY MOUTH EVERY DAY 07/27/20   Reed, Tiffany L, DO  finasteride (PROSCAR) 5 MG tablet TAKE 1 TABLET BY MOUTH EVERY DAY IN THE MORNING 06/02/20   Reed, Tiffany L, DO  furosemide (LASIX)  40 MG tablet TAKE 1/2 TABLET BY MOUTH DAILY 08/04/20   Troy Sine, MD  metoprolol tartrate (LOPRESSOR) 25 MG tablet Take 1 tablet (25 mg total) by mouth 2 (two) times daily. 11/27/20 02/25/21  Deberah Pelton, NP  Omega-3 Fatty Acids (FISH OIL PO) Take 1,400 mg by mouth.    [provider]  Polyvinyl Alcohol-Povidone (REFRESH OP) Place 1 drop into the right eye daily as needed (dry eyes/irritation).    [provider]  spironolactone (ALDACTONE) 25 MG tablet TAKE 1 TABLET (25 MG TOTAL) BY MOUTH DAILY. 12/11/20   Troy Sine, MD    Family History    Family History  Problem Relation Age of Onset  . Diabetes Mother   . Arrhythmia Mother   . Hypertension Mother   . Dementia Father   . Pulmonary embolism Father   . Hypertension Father   . Cancer Sister        Liver cancer - Histocytic lymphoma  . Cancer Sister 12       Skin cancer  . Hypertension Sister 45  . Diabetes Sister   . Stroke Brother   . Diabetes Brother   . Hypertension Brother   . Cancer Daughter   . Cancer Maternal Grandmother        Skin cancer  . Heart attack Maternal Grandfather   . Cancer Paternal Grandfather   . Hypertension Paternal Grandfather    He indicated that his mother is deceased. He indicated that his father is deceased. He indicated that only one of his two sisters is alive. He indicated that his brother is deceased. He indicated that his maternal grandmother is deceased. He indicated that his maternal grandfather is deceased. He indicated that his paternal grandmother is deceased. He indicated that his paternal grandfather is deceased. He indicated that his daughter is alive. He indicated that his son is alive.  Social History    Social History   Socioeconomic History  . Marital status: Married    Spouse name: Not on file  . Number of children: Not on file  . Years of education: Not on file  . Highest education level: Not on file  Occupational History  . Occupation: retired  Medical sales representative     Comment: Hospital doctor  Tobacco Use  . Smoking status: Never Smoker  . Smokeless tobacco: Never Used  Vaping Use  . Vaping Use: Never used  Substance and Sexual Activity  . Alcohol use: No  . Drug use: No  . Sexual activity: Yes    Partners: Female    Comment: wife  Other Topics Concern  . Not on file  Social History Narrative   Married   Never smoked   Alcohol none   Exercise -gym 3 days a week (join 3 months ago)   Living Will   Social Determinants of Health   Financial Resource Strain: Not on file  Food Insecurity: Not on file  Transportation Needs: Not on file  Physical Activity: Not on file  Stress: Not on file  Social Connections: Not on file  Intimate Partner Violence: Not on file     Review of Systems    General:  No chills, fever, night sweats or weight changes.  Cardiovascular:  No chest pain, dyspnea on exertion, edema, orthopnea, palpitations, paroxysmal nocturnal dyspnea. Dermatological: No rash, lesions/masses Respiratory: No cough, dyspnea Urologic: No hematuria, dysuria Abdominal:   No nausea, vomiting, diarrhea, bright red blood per rectum, melena, or hematemesis Neurologic:  No visual changes, wkns, changes in mental status. All other systems reviewed and are otherwise negative except as noted above.  Physical Exam    VS:  BP (!) 144/78   Pulse 68   Ht 5\' 11"  (1.803 m)   Wt 290 lb 6.4 oz (131.7 kg)   SpO2 99%   BMI 40.50 kg/m  , BMI Body mass index is 40.5 kg/m. GEN: Well nourished, well developed, in no acute distress. HEENT: normal. Neck: Supple, no JVD, carotid bruits, or masses. Cardiac: RRR, no murmurs, rubs, or gallops. No clubbing, cyanosis, +1 pitting bilateral lower extremity edema.  Radials/DP/PT 2+ and equal bilaterally.  Respiratory:  Respirations regular and unlabored, clear to auscultation bilaterally. GI: Soft, nontender, nondistended, BS + x 4. MS: no deformity or atrophy. Skin: warm and dry, no  rash. Neuro:  Strength and sensation are intact. Psych: Normal affect.  Accessory Clinical Findings    Recent Labs: 03/30/2020: BUN 17; Creat 1.01; Potassium 4.3; Sodium 141   Recent Lipid Panel    Component Value Date/Time   CHOL 190 08/10/2020 0806   CHOL 173 12/02/2019 0844   CHOL 191 07/22/2013 0835   TRIG 214 (H) 08/10/2020 0806   TRIG 253 (H) 07/22/2013 0835   HDL 44 08/10/2020 0806   HDL 41 12/02/2019 0844   HDL 38 (L) 07/22/2013 0835   CHOLHDL 4.3 08/10/2020 0806   VLDL 35 (H) 10/27/2016 0825   LDLCALC 113 (H) 08/10/2020 0806   LDLCALC 102 (H) 07/22/2013 0835    ECG personally reviewed by me today-none today.  EKG 11/27/2020 normal sinus rhythm cannot rule out inferior infarct undetermined age 37 bpm- No acute changes  Echocardiogram 08/30/2018  Study Conclusions   - Left ventricle: The cavity size was normal. There was mild  concentric hypertrophy. Systolic function was normal. The  estimated ejection fraction was in the range of 60% to 65%. Wall  motion was normal; there were no regional wall motion  abnormalities. Features are consistent with a pseudonormal left  ventricular filling pattern, with concomitant abnormal relaxation  and increased filling pressure (grade 2 diastolic dysfunction).  Doppler parameters are consistent with elevated ventricular  end-diastolic filling pressure.  - Aortic valve: There was no regurgitation.  - Mitral valve: There was mild regurgitation.  - Left  atrium: The atrium was moderately dilated.  - Right ventricle: The cavity size was mildly dilated. Wall  thickness was normal. Systolic function was normal.  - Right atrium: The atrium was normal in size.  - Tricuspid valve: There was mild regurgitation.  - Pulmonic valve: There was mild regurgitation.  - Pulmonary arteries: Systolic pressure was within the normal  range. PA peak pressure: 35 mm Hg (S).  - Inferior vena cava: The vessel was normal in size.   - Pericardium, extracardiac: There was no pericardial effusion.  Coronary CTA 09/14/2018 IMPRESSION: 1. Coronary calcium score of 26.4. This was 28th percentile for age and sex matched control.  2. Normal coronary origin with right dominance.  3. Minimal plaque in ostial LAD and OM1.  Skeet Latch, MD  Assessment & Plan   1.   Chest pain-no chest pain today.  Presented to [facility] emergency department on 11/16/2020.  High-sensitivity troponins were low and flat.  EKG showed no ischemic changes.  Chest x-ray showed no acute abnormalities.  Labs were unremarkable.  Underwent coronary CTA 09/14/2018 which showed calcium score of 26.4 and minimal plaque in his ostial LAD and OM1.  His chest discomfort was previously felt to be related to musculoskeletal issues. Continue amlodipine, aspirin Heart healthy low-sodium diet-salty 6 given Increase physical activity as tolerated  Grade 2 diastolic dysfunction/bilateral lower extremity edema- notes a weight increase of 5.5 pounds since last visit.  Has +1 pitting bilateral lower extremity edema.  Reports dietary indiscretion. Increase furosemide to 40 mg daily Daily weights-contact office with a weight increase of 3 pounds overnight or 5 pounds in 1 week Lower extremity support stockings-Terril support stocking sheet given Heart healthy low-sodium diet-salty 6 given Increase physical activity as tolerated Order BMP in 1 week   Essential hypertension-BP today  144/78.  Similar control at home Continue amlodipine, benazepril,, furosemide, spironolactone Heart healthy low-sodium diet-salty 6 given Increase physical activity as tolerated Continue weight loss Start metoprolol succinate 25 mg daily  Hyperlipidemia-08/10/2020: Cholesterol 190; HDL 44; LDL Cholesterol (Calc) 113; Triglycerides 214.  Statin intolerant. Continue Zetia, fish oil Heart healthy low-sodium high-fiber diet Increase physical activity as tolerated  Morbid  obesity-weight today 290 pounds. Continue weight loss Increase physical activity as tolerated Heart healthy low-sodium diet  Disposition: Follow-up with Dr. Claiborne Billings in 1 or me month.   Jossie Ng. Rayah Fines NP-C    12/28/2020, 9:06 AM [address] Office [phone] Fax [phone]  Notice: This dictation was prepared with Dragon dictation along with smaller phrase technology. Any transcriptional errors that result from this process are unintentional and may not be corrected upon review.  I spent 12 minutes examining this patient, reviewing medications, and using patient centered shared decision making involving her cardiac care.  Prior to her visit I spent greater than 20 minutes reviewing her past medical history,  medications, and prior cardiac tests.

## 2020-12-28 ENCOUNTER — Ambulatory Visit: Payer: PPO | Admitting: General Practice

## 2020-12-28 ENCOUNTER — Encounter: Payer: Self-pay | Admitting: General Practice

## 2020-12-28 ENCOUNTER — Other Ambulatory Visit: Payer: Self-pay

## 2020-12-28 VITALS — BP 144/78 | HR 68 | Ht 71.0 in | Wt 290.4 lb

## 2020-12-28 DIAGNOSIS — E785 Hyperlipidemia, unspecified: Secondary | ICD-10-CM

## 2020-12-28 DIAGNOSIS — Z79899 Other long term (current) drug therapy: Secondary | ICD-10-CM

## 2020-12-28 DIAGNOSIS — R600 Localized edema: Secondary | ICD-10-CM | POA: Diagnosis not present

## 2020-12-28 DIAGNOSIS — I5189 Other ill-defined heart diseases: Secondary | ICD-10-CM

## 2020-12-28 DIAGNOSIS — I10 Essential (primary) hypertension: Secondary | ICD-10-CM

## 2020-12-28 DIAGNOSIS — R079 Chest pain, unspecified: Secondary | ICD-10-CM

## 2020-12-28 MED ORDER — FUROSEMIDE 40 MG PO TABS: 40.0000 mg | ORAL_TABLET | Freq: Every day | ORAL | 2 refills | Status: DC

## 2020-12-28 MED ORDER — METOPROLOL SUCCINATE ER 25 MG PO TB24: 25.0000 mg | ORAL_TABLET | Freq: Every day | ORAL | 6 refills | Status: DC

## 2020-12-28 NOTE — Patient Instructions (Signed)
Medication Instructions:  INCREASE LASIX 40MG  DAILY  START METOPROLOL SUCCINATE 25MG  DAILY *If you need a refill on your cardiac medications before your next appointment, please call your pharmacy*  Lab Work: BMET IN 1 WEEK (~01-04-2021) If you have labs (blood work) drawn today and your tests are completely normal, you will receive your results only by:  [place] (if you have MyChart) OR A paper copy in the mail.  If you have any lab test that is abnormal or we need to change your treatment, we will call you to review the results. You may go to any [facility] that is convenient for you however, we do have a lab in our office that is able to assist you. You DO NOT need an appointment for our lab. The lab is open 8:00am and closes at 4:00pm. Lunch 12:45 - 1:45pm.  Special Instructions PLEASE TAKE AN LOG YOUR WEIGHT AND BLOOD PRESSURE DAILY  PLEASE READ AND FOLLOW SALTY 6-ATTACHED-1,800 mg daily  PLEASE PURCHASE AND WEAR COMPRESSION STOCKINGS DAILY AND TAKE OFF AT BEDTIME. Compression stockings are elastic socks that squeeze the legs. They help to increase blood flow to the legs and to decrease swelling in the legs from fluid retention, and reduce the chance of developing blood clots in the lower legs. Please put on in the AM when dressing and off at night when dressing for bed.  LET THEM KNOW THAT YOU NEED KNEE HIGH'S WITH COMPRESSION OF 15-20 mmhg.  [employer]  [place] ([place] [phone]); [place], [place] [address]; [phone]  EMAIL   [email or web address].  PLEASE MAKE SURE TO ELEVATE YOUR FEET & LEGS WHILE SITTING, THIS WILL HELP WITH THE SWELLING ALSO.   Follow-Up: Your next appointment:  1 month(s) In Person with Shelva Majestic, MD OR IF UNAVAILABLE Coletta Memos, FNP-C  At [facility], you and your health needs are our priority.  As part of our continuing mission to provide you with exceptional heart care, we have created designated Provider Care Teams.   These Care Teams include your primary Cardiologist (physician) and Advanced Practice Providers (APPs -  Physician Assistants and Nurse Practitioners) who all work together to provide you with the care you need, when you need it.

## 2021-01-04 DIAGNOSIS — E785 Hyperlipidemia, unspecified: Secondary | ICD-10-CM | POA: Diagnosis not present

## 2021-01-04 DIAGNOSIS — R079 Chest pain, unspecified: Secondary | ICD-10-CM | POA: Diagnosis not present

## 2021-01-04 DIAGNOSIS — I10 Essential (primary) hypertension: Secondary | ICD-10-CM | POA: Diagnosis not present

## 2021-01-04 DIAGNOSIS — Z79899 Other long term (current) drug therapy: Secondary | ICD-10-CM | POA: Diagnosis not present

## 2021-01-04 LAB — BASIC METABOLIC PANEL
BUN/Creatinine Ratio: 14 (ref 10–24)
BUN: 15 mg/dL (ref 8–27)
CO2: 24 mmol/L (ref 20–29)
Calcium: 9 mg/dL (ref 8.6–10.2)
Chloride: 106 mmol/L (ref 96–106)
Creatinine, Ser: 1.05 mg/dL (ref 0.76–1.27)
Glucose: 106 mg/dL — ABNORMAL HIGH (ref 65–99)
Potassium: 4.3 mmol/L (ref 3.5–5.2)
Sodium: 142 mmol/L (ref 134–144)
eGFR: 76 mL/min/{1.73_m2} (ref >59)

## 2021-01-05 MED ORDER — FUROSEMIDE 20 MG PO TABS: ORAL_TABLET | ORAL | 3 refills | Status: DC

## 2021-01-05 NOTE — Telephone Encounter (Signed)
Spoke with the patient and advised him on recommendations to alternate Lasix 20 mg one day and 40 mg the next day. Patient verbalized understanding.  Awaiting clarification on other medication adjustment

## 2021-01-05 NOTE — Telephone Encounter (Signed)
Called the patient in regards to his MyChart message who states that earlier this morning he went out to get a haircut and started getting lightheaded. He states that he took his medications as normal this morning and BP was 155/80. His Lasix was recent increased to 40 mg per day. He states that lightheadedness is still somewhat present when he is sitting down but worse when he is moving around. He has been using his cane to get around. Patient reports drinking 3 cups of coffee and 1 cup of water today. Encouraged the patient to drink more water. Patient also encouraged to change positions slowly. Patient also reports some weakness and nausea. Denies any shortness of breath or chest pain. He does not feel like he is going to pass out. He states that he has been more fatigued since Sunday. He thought it was from working around his home all day on Saturday.  Patient's recent BP 159/83 and heart rate has been in the 80s.

## 2021-01-06 MED ORDER — METOPROLOL SUCCINATE ER 25 MG PO TB24: 37.5000 mg | ORAL_TABLET | Freq: Every day | ORAL | 3 refills | Status: DC

## 2021-01-20 ENCOUNTER — Other Ambulatory Visit: Payer: Self-pay | Admitting: *Deleted

## 2021-01-20 DIAGNOSIS — E7849 Other hyperlipidemia: Secondary | ICD-10-CM

## 2021-01-20 MED ORDER — EZETIMIBE 10 MG PO TABS: 10.0000 mg | ORAL_TABLET | Freq: Every day | ORAL | 1 refills | Status: DC

## 2021-01-20 NOTE — Telephone Encounter (Signed)
Received refill Request from pharmacy.  

## 2021-01-22 DIAGNOSIS — R1310 Dysphagia, unspecified: Secondary | ICD-10-CM | POA: Diagnosis not present

## 2021-01-22 DIAGNOSIS — Z86010 Personal history of colonic polyps: Secondary | ICD-10-CM | POA: Diagnosis not present

## 2021-01-22 DIAGNOSIS — R490 Dysphonia: Secondary | ICD-10-CM | POA: Diagnosis not present

## 2021-01-22 DIAGNOSIS — K219 Gastro-esophageal reflux disease without esophagitis: Secondary | ICD-10-CM | POA: Diagnosis not present

## 2021-02-04 ENCOUNTER — Ambulatory Visit: Payer: PPO | Admitting: General Practice

## 2021-02-08 NOTE — Progress Notes (Signed)
Cardiology Clinic Note   Patient Name: Anthony [name]. Date of Encounter: 02/09/2021  Primary Care Provider:  Wardell Honour, MD Primary Cardiologist:  Anthony Majestic, MD  Patient Profile    Anthony [name] 70 year old male presents the clinic today for follow-up evaluation of his chest pain and HTN.  Past Medical History    Past Medical History:  Diagnosis Date   Benign neoplasm of colon    Benign neoplasm of skin of upper limb, including shoulder    Carpal tunnel syndrome    Chest pain    a. normal cors by cath in 2015   Diaphragmatic hernia without mention of obstruction or gangrene    Dyslipidemia    Elevated prostate specific antigen (PSA)    GERD (gastroesophageal reflux disease)    Herpes zoster without mention of complication    History of hiatal hernia    Hypertension    Hypertrophy of prostate with urinary obstruction and other lower urinary tract symptoms (LUTS)    Impotence of organic origin    Lumbago    Obesity, unspecified    Osteoarthrosis, unspecified whether generalized or localized, lower leg    Other and unspecified hyperlipidemia    Other malaise and fatigue    Other seborrheic keratosis    Special screening for malignant neoplasm of prostate    Stenosis, cervical spine    Unspecified tinnitus    Past Surgical History:  Procedure Laterality Date   ANTERIOR CERVICAL DECOMP/DISCECTOMY FUSION N/A 02/19/2018   Procedure: ANTERIOR CERVICAL DECOMPRESSION/DISCECTOMY FUSION - CERVICAL FOUR-CERVICAL FIVE - CERVICAL FIVE-CERVICAL SIX;  Surgeon: Earnie Larsson, MD;  Location: [facility];  Service: Neurosurgery;  Laterality: N/A;   CARDIOVASCULAR STRESS TEST  06/15/2012   Non-diagnostic for ischemia. No lexiscan EKG changes.   CHOLECYSTECTOMY  1992   EYE SURGERY     eyeband  2006   removal of right eyeband   INGUINAL HERNIA REPAIR  1984   left   INTRAOCULAR LENS REMOVAL  2008   right   LEFT HEART CATHETERIZATION WITH CORONARY ANGIOGRAM N/A 09/05/2013    Procedure: LEFT HEART CATHETERIZATION WITH CORONARY ANGIOGRAM;  Surgeon: Troy Sine, MD;  Location: [facility] CATH LAB;  Service: Cardiovascular;  Laterality: N/A;   LOWER VENOUS EXTREMITY DOPPLER  01/26/2011   No evidence of thrombus or thrombophlebitis.   RETINAL DETACHMENT SURGERY  2001   right   TOTAL HIP ARTHROPLASTY  2012   bilateral   TOTAL KNEE ARTHROPLASTY  2011, 2012   bilateral   TRANSTHORACIC ECHOCARDIOGRAM  10/05/2009   EF >55%, normal LV size, systolic, and diastolic function.    Allergies  Allergies  Allergen Reactions   Lipitor [Atorvastatin] Other (See Comments)    Leg pain/cramps; statin myopathy   Red Yeast Rice [Cholestin] Other (See Comments)    Makes legs hurt, myopathy   Statins Other (See Comments)    "muscle break down", statin myopathy   Codeine Other (See Comments)    Headache   Latex Rash    History of Present Illness    Anthony [name] has a PMH of hypertension, dysphagia, esophageal stenosis, GERD, acid reflux, DJD, BPH, mixed hyperlipidemia, morbid obesity, clinical depression, and chest pain.  He underwent nuclear stress test 10/13 for chest pain which showed normal perfusion without scar or ischemia.  An echocardiogram 1/15 showed an ejection fraction of 55-60%, G1 DD mild left ventricular hypertrophy, mild mitral annular calcification with mildly thickened leaflets and trivial mitral regurgitation.  It was felt that his  chest pain was most likely musculoskeletal.  A CTA in 2020 showed minimal coronary plaque involving his LAD ostium and OM 1.  He was also noted to have atherosclerosis of his thoracic aorta.   He was seen by Anthony [name] 6/21 with mild chest tightness for 3 to 4 weeks.  He reported symptoms were not always with exertion.  He reported decreased energy.  He was having difficulty with his reflux.  He also reported some sharp pain and discomfort which was going to his back and was nonexertional.  He was seen Anthony [name] for esophageal dilation in the  future.  He reported he had stopped his statin therapy and Nexlizet was recommended.  Weight loss and OSA were also discussed.   He was last seen and evaluated by Anthony [name] on 09/28/2020.  He had not pursued sleep evaluation.  He felt he was sleeping well and reported typically sleeping 8 to 9 hours per night.  He denied any exertion symptomology but did note very sharp twinges anteriorly at times.  He continued to have issues with his neck and had an ablation procedure planned.  He remains sedentary due to arthritic issues and post bilateral knee and hip replacement surgeries.  He was tolerating his ezetimibe well.  Lipid panel showed LDL of 113.  Fish oil was recommended.  Improving his diet was discussed.  Follow-up was planned for 1 year and as needed.   He presented to [facility] ED [facility]/16/2020.  He reported several days of chest pain with no associated nausea or vomiting.  He denied shortness of breath.  His blood pressure at that time was 161/84 with pulse of 74.  He reported that the pain was in the center of his chest, radiated to his jaw and down his right arm.  He also had shortness of breath and headache.  His lab work was unremarkable.  Chest x-ray showed no acute abnormalities.  EKG showed normal sinus rhythm.  High-sensitivity troponins negative.   He presented to the clinic 4/8/ 22 for follow-up evaluation stated he was at the [facility] visit at [facility] when they insisted he present to the emergency department for further evaluation.  He reported that he was having some pressure across his chest that he felt was related to reflux.  He had not had any further episodes of chest pain.  He brought blood pressure log  which showed average blood pressures in the 150s over 80s at home.  His blood pressure in the office was well controlled at 130/78.  I  added metoprolol tartrate 25 mg twice daily to his medication regimen, asked him to maintain a blood pressure log, gave him the salty 6 diet sheet, and had him  increase physical activity as tolerated.  We planned follow-up in 1 month.   He presented to the clinic 12/28/2020 for follow-up evaluation stated he felt fairly well .  He had noticed increased work of breathing with increased physical activity.  He had also had a 5.5 pound weight increase since his visit 1 month ago.  He reported increased lower extremity edema.  He also reported dietary indiscretion.  I will gave him the salty 6 diet sheet, had him increase physical activity as tolerated, increased his Lasix to 40 mg daily, changed his metoprolol to succinate 25 daily, ordered a BMP in 1 week and planned follow-up in 1 month.  His BMP was unremarkable.  Presents to clinic today for follow-up evaluation states he has much better blood pressures  at home.  He reports his systolic blood pressures are in the 125-130 range.  He has been tolerating his increased furosemide dosing well.  We reviewed his most recent lab work.  He reports that he has been noticing last lower extremity edema.  He is limited in his physical activity by his joint pain.  We discussed increasing his physical activity through activities such as swimming and bicycling.  He does note some increased fatigue after doing bouts of yard work.  We will continue his current medication regimen, continue heart healthy low-sodium diet, have him increase his physical activity as tolerated with a goal of 150 minutes of moderate physical activity per week and have him follow-up in 6 months.   Today he denies chest pain, shortness of breath, fatigue, palpitations, melena, hematuria, hemoptysis, diaphoresis, weakness, presyncope, syncope, orthopnea, and PND.  Home Medications    Prior to Admission medications   Medication Sig Start Date End Date Taking? Authorizing Provider  amLODipine (NORVASC) 10 MG tablet TAKE 1 TABLET BY MOUTH EVERY DAY IN THE MORNING 07/28/20   Troy Sine, MD  aspirin EC 81 MG tablet Take 81 mg by mouth every morning.     [provider]  benazepril (LOTENSIN) 40 MG tablet TAKE 1 TABLET BY MOUTH EVERY DAY 09/21/20   Reed, Tiffany L, DO  brimonidine (ALPHAGAN P) 0.1 % SOLN     [provider]  cyclobenzaprine (FLEXERIL) 10 MG tablet Take 10 mg by mouth 3 (three) times daily. As needed 05/10/20   [provider]  dorzolamide-timolol (COSOPT) 22.3-6.8 MG/ML ophthalmic solution Place 2 drops into the right eye 2 (two) times daily.     [provider]  esomeprazole (NEXIUM) 40 MG capsule TAKE 1 CAPSULE BY MOUTH AT BEDTIME. FOR REFLUX 09/21/20   Reed, Tiffany L, DO  ezetimibe (ZETIA) 10 MG tablet Take 1 tablet (10 mg total) by mouth daily. 01/20/21   Anthony Honour, MD  finasteride (PROSCAR) 5 MG tablet TAKE 1 TABLET BY MOUTH EVERY DAY IN THE MORNING 06/02/20   Reed, Tiffany L, DO  furosemide (LASIX) 20 MG tablet Take 1 tablet (20 mg total) every other day, alternating with 2 tablets (40 mg total) every other day. 01/05/21   Deberah Pelton, NP  metoprolol succinate (TOPROL XL) 25 MG 24 hr tablet Take 1.5 tablets (37.5 mg total) by mouth daily. 01/06/21   Deberah Pelton, NP  Omega-3 Fatty Acids (FISH OIL PO) Take 1,400 mg by mouth.    [provider]  Polyvinyl Alcohol-Povidone (REFRESH OP) Place 1 drop into the right eye daily as needed (dry eyes/irritation).    [provider]  spironolactone (ALDACTONE) 25 MG tablet TAKE 1 TABLET (25 MG TOTAL) BY MOUTH DAILY. 12/11/20   Troy Sine, MD    Family History    Family History  Problem Relation Age of Onset   Diabetes Mother    Arrhythmia Mother    Hypertension Mother    Dementia Father    Pulmonary embolism Father    Hypertension Father    Cancer Sister        Liver cancer - Histocytic lymphoma   Cancer Sister 60       Skin cancer   Hypertension Sister 77   Diabetes Sister    Stroke Brother    Diabetes Brother    Hypertension Brother    Cancer Daughter    Cancer Maternal Grandmother        Skin cancer  Heart attack Maternal Grandfather    Cancer Paternal Grandfather    Hypertension Paternal Grandfather    He indicated that his mother is deceased. He indicated that his father is deceased. He indicated that only one of his two sisters is alive. He indicated that his brother is deceased. He indicated that his maternal grandmother is deceased. He indicated that his maternal grandfather is deceased. He indicated that his paternal grandmother is deceased. He indicated that his paternal grandfather is deceased. He indicated that his daughter is alive. He indicated that his son is alive.  Social History    Social History   Socioeconomic History   Marital status: Married    Spouse name: Not on file   Number of children: Not on file   Years of education: Not on file   Highest education level: Not on file  Occupational History   Occupation: retired Medical sales representative     Comment: Hospital doctor  Tobacco Use   Smoking status: Never   Smokeless tobacco: Never  Scientific laboratory technician Use: Never used  Substance and Sexual Activity   Alcohol use: No   Drug use: No   Sexual activity: Yes    Partners: Female    Comment: wife  Other Topics Concern   Not on file  Social History Narrative   Married   Never smoked   Alcohol none   Exercise -gym 3 days a week (join 3 months ago)   Living Will   Social Determinants of Health   Financial Resource Strain: Not on file  Food Insecurity: Not on file  Transportation Needs: Not on file  Physical Activity: Not on file  Stress: Not on file  Social Connections: Not on file  Intimate Partner Violence: Not on file     Review of Systems    General:  No chills, fever, night sweats or weight changes.  Cardiovascular:  No chest pain, dyspnea on exertion, edema, orthopnea, palpitations, paroxysmal nocturnal dyspnea. Dermatological: No rash, lesions/masses Respiratory: No cough, dyspnea Urologic: No hematuria, dysuria Abdominal:   No nausea, vomiting,  diarrhea, bright red blood per rectum, melena, or hematemesis Neurologic:  No visual changes, wkns, changes in mental status. All other systems reviewed and are otherwise negative except as noted above.  Physical Exam    VS:  BP 130/72 (BP Location: Left Arm)   Pulse 77   Ht 5\' 10"  (1.778 m)   Wt 284 lb 3.2 oz (128.9 kg)   SpO2 96%   BMI 40.78 kg/m  , BMI Body mass index is 40.78 kg/m. GEN: Well nourished, well developed, in no acute distress. HEENT: normal. Neck: Supple, no JVD, carotid bruits, or masses. Cardiac: RRR, no murmurs, rubs, or gallops. No clubbing, cyanosis, edema.  Radials/DP/PT 2+ and equal bilaterally.  Respiratory:  Respirations regular and unlabored, clear to auscultation bilaterally. GI: Soft, nontender, nondistended, BS + x 4. MS: no deformity or atrophy. Skin: warm and dry, no rash. Neuro:  Strength and sensation are intact. Psych: Normal affect.  Accessory Clinical Findings    Recent Labs: 01/04/2021: BUN 15; Creatinine, Ser 1.05; Potassium 4.3; Sodium 142   Recent Lipid Panel    Component Value Date/Time   CHOL 190 08/10/2020 0806   CHOL 173 12/02/2019 0844   CHOL 191 07/22/2013 0835   TRIG 214 (H) 08/10/2020 0806   TRIG 253 (H) 07/22/2013 0835   HDL 44 08/10/2020 0806   HDL 41 12/02/2019 0844   HDL 38 (L) 07/22/2013 3500  CHOLHDL 4.3 08/10/2020 0806   VLDL 35 (H) 10/27/2016 0825   LDLCALC 113 (H) 08/10/2020 0806   LDLCALC 102 (H) 07/22/2013 0835    ECG personally reviewed by me today-  None today.   EKG 11/27/2020 normal sinus rhythm cannot rule out inferior infarct undetermined age 66 bpm- No acute changes   Echocardiogram 08/30/2018   Study Conclusions   - Left ventricle: The cavity size was normal. There was mild    concentric hypertrophy. Systolic function was normal. The    estimated ejection fraction was in the range of 60% to 65%. Wall    motion was normal; there were no regional wall motion    abnormalities. Features are  consistent with a pseudonormal left    ventricular filling pattern, with concomitant abnormal relaxation    and increased filling pressure (grade 2 diastolic dysfunction).    Doppler parameters are consistent with elevated ventricular    end-diastolic filling pressure.  - Aortic valve: There was no regurgitation.  - Mitral valve: There was mild regurgitation.  - Left atrium: The atrium was moderately dilated.  - Right ventricle: The cavity size was mildly dilated. Wall    thickness was normal. Systolic function was normal.  - Right atrium: The atrium was normal in size.  - Tricuspid valve: There was mild regurgitation.  - Pulmonic valve: There was mild regurgitation.  - Pulmonary arteries: Systolic pressure was within the normal    range. PA peak pressure: 35 mm Hg (S).  - Inferior vena cava: The vessel was normal in size.  - Pericardium, extracardiac: There was no pericardial effusion.   Coronary CTA 09/14/2018 IMPRESSION: 1. Coronary calcium score of 26.4. This was 28th percentile for age and sex matched control.   2. Normal coronary origin with right dominance.   3. Minimal plaque in ostial LAD and OM1.   Skeet Latch, MD  Assessment & Plan   1.  Essential hypertension-BP today 130/72.  Now well controlled at home.   Continue amlodipine, benazepril,, furosemide, spironolactone Heart healthy low-sodium diet-salty 6 given Increase physical activity as tolerated Continue weight loss Continue metoprolol succinate 25 mg daily  Chest pain-no chest pain.  Presented to [facility] emergency department on 11/16/2020.  High-sensitivity troponins were low and flat.  EKG showed no ischemic changes.  Chest x-ray showed no acute abnormalities.  Labs were unremarkable.  Underwent coronary CTA 09/14/2018 which showed calcium score of 26.4 and minimal plaque in his ostial LAD and OM1.  His chest discomfort was previously felt to be related to musculoskeletal issues. Continue amlodipine,  aspirin Heart healthy low-sodium diet-salty 6 given Increase physical activity as tolerated   Grade 2 diastolic dysfunction/bilateral lower extremity edema-euvolemic today.  Following a low-sodium diet.   Increase furosemide to 40 mg daily Daily weights-contact office with a weight increase of 3 pounds overnight or 5 pounds in 1 week Continue lower extremity support stockings Heart healthy low-sodium diet-salty 6 given Increase physical activity as tolerated  Hyperlipidemia-08/10/2020: Cholesterol 190; HDL 44; LDL Cholesterol (Calc) 113; Triglycerides 214.  Statin intolerant. Continue Zetia, fish oil Heart healthy low-sodium high-fiber diet Increase physical activity as tolerated   Morbid obesity-weight today 284 pounds. Continue weight loss Increase physical activity as tolerated Heart healthy low-sodium diet  Disposition: Follow-up with Anthony [name] in 6 months.  Jossie Ng. Juda Toepfer NP-C    02/09/2021, [address] Office [phone] Fax [phone]  Notice: This dictation was prepared with Dragon dictation along  with smaller phrase technology. Any transcriptional errors that result from this process are unintentional and may not be corrected upon review.  I spent 12 minutes examining this patient, reviewing medications, and using patient centered shared decision making involving her cardiac care.  Prior to her visit I spent greater than 20 minutes reviewing her past medical history,  medications, and prior cardiac tests.

## 2021-02-09 ENCOUNTER — Ambulatory Visit: Payer: PPO | Admitting: General Practice

## 2021-02-09 ENCOUNTER — Encounter: Payer: Self-pay | Admitting: General Practice

## 2021-02-09 ENCOUNTER — Other Ambulatory Visit: Payer: Self-pay

## 2021-02-09 VITALS — BP 130/72 | HR 77 | Ht 70.0 in | Wt 284.2 lb

## 2021-02-09 DIAGNOSIS — I5189 Other ill-defined heart diseases: Secondary | ICD-10-CM | POA: Diagnosis not present

## 2021-02-09 DIAGNOSIS — I10 Essential (primary) hypertension: Secondary | ICD-10-CM

## 2021-02-09 DIAGNOSIS — R079 Chest pain, unspecified: Secondary | ICD-10-CM

## 2021-02-09 DIAGNOSIS — E785 Hyperlipidemia, unspecified: Secondary | ICD-10-CM | POA: Diagnosis not present

## 2021-02-09 NOTE — Patient Instructions (Signed)
Medication Instructions:  The current medical regimen is effective;  continue present plan and medications as directed. Please refer to the Current Medication list given to you today. *If you need a refill on your cardiac medications before your next appointment, please call your pharmacy*  Lab Work:   Testing/Procedures:  NONE    NONE  Special Instructions PLEASE READ AND FOLLOW SALTY 6-ATTACHED-1,800mg  daily  PLEASE INCREASE PHYSICAL ACTIVITY AS TOLERATED-150 MIN/WEEKLY OVER 3-6 MONTHS. NON WEIGHT BEARING W/STATIONARY BIKE/ REG. BIKE OR SWIMMING.  Follow-Up: Your next appointment:  6 month(s) In Person with Shelva Majestic, MD ONLY  At [facility], you and your health needs are our priority.  As part of our continuing mission to provide you with exceptional heart care, we have created designated Provider Care Teams.  These Care Teams include your primary Cardiologist (physician) and Advanced Practice Providers (APPs -  Physician Assistants and Nurse Practitioners) who all work together to provide you with the care you need, when you need it.            6 SALTY THINGS TO AVOID     1,800MG  DAILY

## 2021-02-12 ENCOUNTER — Other Ambulatory Visit: Payer: Self-pay

## 2021-02-12 DIAGNOSIS — I10 Essential (primary) hypertension: Secondary | ICD-10-CM

## 2021-02-12 DIAGNOSIS — G720 Drug-induced myopathy: Secondary | ICD-10-CM

## 2021-02-12 DIAGNOSIS — R739 Hyperglycemia, unspecified: Secondary | ICD-10-CM

## 2021-02-12 DIAGNOSIS — E7849 Other hyperlipidemia: Secondary | ICD-10-CM

## 2021-02-15 DIAGNOSIS — J209 Acute bronchitis, unspecified: Secondary | ICD-10-CM | POA: Diagnosis not present

## 2021-02-22 ENCOUNTER — Other Ambulatory Visit: Payer: Self-pay | Admitting: General Practice

## 2021-03-01 ENCOUNTER — Other Ambulatory Visit: Payer: PPO

## 2021-03-01 ENCOUNTER — Other Ambulatory Visit: Payer: Self-pay

## 2021-03-01 DIAGNOSIS — E7849 Other hyperlipidemia: Secondary | ICD-10-CM | POA: Diagnosis not present

## 2021-03-01 DIAGNOSIS — G720 Drug-induced myopathy: Secondary | ICD-10-CM | POA: Diagnosis not present

## 2021-03-01 DIAGNOSIS — I10 Essential (primary) hypertension: Secondary | ICD-10-CM

## 2021-03-01 DIAGNOSIS — T466X5A Adverse effect of antihyperlipidemic and antiarteriosclerotic drugs, initial encounter: Secondary | ICD-10-CM | POA: Diagnosis not present

## 2021-03-01 DIAGNOSIS — R739 Hyperglycemia, unspecified: Secondary | ICD-10-CM | POA: Diagnosis not present

## 2021-03-01 DIAGNOSIS — R350 Frequency of micturition: Secondary | ICD-10-CM | POA: Diagnosis not present

## 2021-03-01 DIAGNOSIS — N401 Enlarged prostate with lower urinary tract symptoms: Secondary | ICD-10-CM

## 2021-03-02 LAB — LIPID PANEL
Cholesterol: 183 mg/dL (ref <200)
HDL: 46 mg/dL (ref >=40)
LDL Cholesterol (Calc): 112 mg/dL (calc) — ABNORMAL HIGH
Non-HDL Cholesterol (Calc): 137 mg/dL (calc) — ABNORMAL HIGH (ref <130)
Total CHOL/HDL Ratio: 4 (calc) (ref <5.0)
Triglycerides: 137 mg/dL (ref <150)

## 2021-03-02 LAB — CBC WITH DIFFERENTIAL/PLATELET
Absolute Monocytes: 577 cells/uL (ref 200–950)
Basophils Absolute: 103 cells/uL (ref 0–200)
Basophils Relative: 1.3 %
Eosinophils Absolute: 245 cells/uL (ref 15–500)
Eosinophils Relative: 3.1 %
HCT: 47.7 % (ref 38.5–50.0)
Hemoglobin: 15.8 g/dL (ref 13.2–17.1)
Lymphs Abs: 1375 cells/uL (ref 850–3900)
MCH: 29.8 pg (ref 27.0–33.0)
MCHC: 33.1 g/dL (ref 32.0–36.0)
MCV: 89.8 fL (ref 80.0–100.0)
MPV: 11.2 fL (ref 7.5–12.5)
Monocytes Relative: 7.3 %
Neutro Abs: 5601 cells/uL (ref 1500–7800)
Neutrophils Relative %: 70.9 %
Platelets: 204 10*3/uL (ref 140–400)
RBC: 5.31 10*6/uL (ref 4.20–5.80)
RDW: 12.5 % (ref 11.0–15.0)
Total Lymphocyte: 17.4 %
WBC: 7.9 10*3/uL (ref 3.8–10.8)

## 2021-03-02 LAB — BASIC METABOLIC PANEL WITH GFR
BUN: 15 mg/dL (ref 7–25)
CO2: 28 mmol/L (ref 20–32)
Calcium: 9.1 mg/dL (ref 8.6–10.3)
Chloride: 110 mmol/L (ref 98–110)
Creat: 0.97 mg/dL (ref 0.70–1.28)
Glucose, Bld: 109 mg/dL — ABNORMAL HIGH (ref 65–99)
Potassium: 4.4 mmol/L (ref 3.5–5.3)
Sodium: 142 mmol/L (ref 135–146)
eGFR: 83 mL/min/{1.73_m2} (ref >=60)

## 2021-03-02 LAB — HEMOGLOBIN A1C
Hgb A1c MFr Bld: 5.4 % of total Hgb (ref <5.7)
Mean Plasma Glucose: 108 mg/dL
eAG (mmol/L): 6 mmol/L

## 2021-03-02 LAB — PSA: PSA: 3.03 ng/mL (ref <=4.00)

## 2021-03-03 ENCOUNTER — Encounter: Payer: Self-pay | Admitting: Family Medicine

## 2021-03-03 ENCOUNTER — Ambulatory Visit (INDEPENDENT_AMBULATORY_CARE_PROVIDER_SITE_OTHER): Payer: PPO | Admitting: Family Medicine

## 2021-03-03 ENCOUNTER — Other Ambulatory Visit: Payer: Self-pay

## 2021-03-03 VITALS — BP 130/80 | HR 74 | Temp 97.8°F | Ht 70.0 in | Wt 286.6 lb

## 2021-03-03 DIAGNOSIS — I10 Essential (primary) hypertension: Secondary | ICD-10-CM | POA: Diagnosis not present

## 2021-03-03 DIAGNOSIS — H6690 Otitis media, unspecified, unspecified ear: Secondary | ICD-10-CM | POA: Diagnosis not present

## 2021-03-03 DIAGNOSIS — R42 Dizziness and giddiness: Secondary | ICD-10-CM | POA: Insufficient documentation

## 2021-03-03 MED ORDER — AMOXICILLIN-POT CLAVULANATE 875-125 MG PO TABS: 1.0000 | ORAL_TABLET | Freq: Two times a day (BID) | ORAL | 0 refills | Status: DC

## 2021-03-03 NOTE — Patient Instructions (Addendum)
Use the inhaler as prescribedHow to Perform the Epley Maneuver The Epley maneuver is an exercise that relieves symptoms of vertigo. Vertigo is the feeling that you or your surroundings are moving when they are not. When you feel vertigo, you may feel like the room is spinning and may have trouble walking. The Epley maneuver is used for a type of vertigo caused by a calcium deposit in a part of the inner ear. The maneuver involves changing headpositions to help the deposit move out of the area. You can do this maneuver at home whenever you have symptoms of vertigo. You canrepeat it in 24 hours if your vertigo has not gone away. Even though the Epley maneuver may relieve your vertigo for a few weeks, it is possible that your symptoms will return. This maneuver relieves vertigo, but itdoes not relieve dizziness. What are the risks? If it is done correctly, the Epley maneuver is considered safe. Sometimes it can lead to dizziness or nausea that goes away after a short time. If you develop other symptoms--such as changes in vision, weakness, or numbness--stopdoing the maneuver and call your health care provider. Supplies needed: A bed or table. A pillow. How to do the Epley maneuver     Sit on the edge of a bed or table with your back straight and your legs extended or hanging over the edge of the bed or table. Turn your head halfway toward the affected ear or side as told by your health care provider. Lie backward quickly with your head turned until you are lying flat on your back. Your head should dangle (head-hanging position). You may want to position a pillow under your shoulders. Hold this position for at least 30 seconds. If you feel dizzy or have symptoms of vertigo, continue to hold the position until the symptoms stop. Turn your head to the opposite direction until your unaffected ear is facing down. Your head should continue to dangle. Hold this position for at least 30 seconds. If you feel  dizzy or have symptoms of vertigo, continue to hold the position until the symptoms stop. Turn your whole body to the same side as your head so that you are positioned on your side. Your head will now be nearly facedown and no longer needs to dangle. Hold for at least 30 seconds. If you feel dizzy or have symptoms of vertigo, continue to hold the position until the symptoms stop. Sit back up. You can repeat the maneuver in 24 hours if your vertigo does not go away. Follow these instructions at home: For 24 hours after doing the Epley maneuver: Keep your head in an upright position. When lying down to sleep or rest, keep your head raised (elevated) with two or more pillows. Avoid excessive neck movements. Activity Do not drive or use machinery if you feel dizzy. After doing the Epley maneuver, return to your normal activities as told by your health care provider. Ask your health care provider what activities are safe for you. General instructions Drink enough fluid to keep your urine pale yellow. Do not drink alcohol. Take over-the-counter and prescription medicines only as told by your health care provider. Keep all follow-up visits. This is important. Preventing vertigo symptoms Ask your health care provider if there is anything you should do at home to prevent vertigo. He or she may recommend that you: Keep your head elevated with two or more pillows while you sleep. Do not sleep on the side of your affected ear. Get  up slowly from bed. Avoid sudden movements during the day. Avoid extreme head positions or movement, such as looking up or bending over. Contact a health care provider if: Your vertigo gets worse. You have other symptoms, including: Nausea. Vomiting. Headache. Get help right away if you: Have vision changes. Have a headache or neck pain that is severe or getting worse. Cannot stop vomiting. Have new numbness or weakness in any part of your body. These symptoms may  represent a serious problem that is an emergency. Do not wait to see if the symptoms will go away. Get medical help right away. Call your local emergency services (911 in the [place] Do not drive yourself to the hospital. Summary Vertigo is the feeling that you or your surroundings are moving when they are not. The Epley maneuver is an exercise that relieves symptoms of vertigo. If the Epley maneuver is done correctly, it is considered safe. This information is not intended to replace advice given to you by your health care provider. Make sure you discuss any questions you have with your healthcare provider. Document Revised: 07/08/2020 Document Reviewed: 07/08/2020 Elsevier Patient Education  2022 [employer].

## 2021-03-03 NOTE — Progress Notes (Signed)
Provider:  Alain Honey, MD  Careteam: Patient Care Team: Wardell Honour, MD as PCP - General (Family Medicine) Troy Sine, MD as PCP - Cardiology (Cardiology)  PLACE OF SERVICE:  [facility]  Advanced Directive information    Allergies  Allergen Reactions   Lipitor [Atorvastatin] Other (See Comments)    Leg pain/cramps; statin myopathy   Red Yeast Rice [Cholestin] Other (See Comments)    Makes legs hurt, myopathy   Statins Other (See Comments)    "muscle break down", statin myopathy   Codeine Other (See Comments)    Headache   Latex Rash    Chief Complaint  Patient presents with   Medical Management of Chronic Issues    Patient presents today for hypertension follow-up.   Acute Visit    Patient reports dizziness and left ear pain that started this morning.     HPI: Patient is a 70 y.o. male patient complains of some positional dizziness today.  Also discomfort in left ear.  He had an episode of bronchitis that was treated at urgent care last week.  That was treated with amoxicillin prednisone and cough medicine.  He also has some residual shortness of breath but denies wheezing.  Review of Systems:  Review of Systems  Respiratory:  Positive for shortness of breath.   Neurological:  Positive for dizziness.  All other systems reviewed and are negative.  Past Medical History:  Diagnosis Date   Benign neoplasm of colon    Benign neoplasm of skin of upper limb, including shoulder    Carpal tunnel syndrome    Chest pain    a. normal cors by cath in 2015   Diaphragmatic hernia without mention of obstruction or gangrene    Dyslipidemia    Elevated prostate specific antigen (PSA)    GERD (gastroesophageal reflux disease)    Herpes zoster without mention of complication    History of hiatal hernia    Hypertension    Hypertrophy of prostate with urinary obstruction and other lower urinary tract symptoms (LUTS)    Impotence of organic origin    Lumbago     Obesity, unspecified    Osteoarthrosis, unspecified whether generalized or localized, lower leg    Other and unspecified hyperlipidemia    Other malaise and fatigue    Other seborrheic keratosis    Special screening for malignant neoplasm of prostate    Stenosis, cervical spine    Unspecified tinnitus    Past Surgical History:  Procedure Laterality Date   ANTERIOR CERVICAL DECOMP/DISCECTOMY FUSION N/A 02/19/2018   Procedure: ANTERIOR CERVICAL DECOMPRESSION/DISCECTOMY FUSION - CERVICAL FOUR-CERVICAL FIVE - CERVICAL FIVE-CERVICAL SIX;  Surgeon: Earnie Larsson, MD;  Location: [facility];  Service: Neurosurgery;  Laterality: N/A;   CARDIOVASCULAR STRESS TEST  06/15/2012   Non-diagnostic for ischemia. No lexiscan EKG changes.   CHOLECYSTECTOMY  1992   EYE SURGERY     eyeband  2006   removal of right eyeband   INGUINAL HERNIA REPAIR  1984   left   INTRAOCULAR LENS REMOVAL  2008   right   LEFT HEART CATHETERIZATION WITH CORONARY ANGIOGRAM N/A 09/05/2013   Procedure: LEFT HEART CATHETERIZATION WITH CORONARY ANGIOGRAM;  Surgeon: Troy Sine, MD;  Location: [facility] CATH LAB;  Service: Cardiovascular;  Laterality: N/A;   LOWER VENOUS EXTREMITY DOPPLER  01/26/2011   No evidence of thrombus or thrombophlebitis.   RETINAL DETACHMENT SURGERY  2001   right   TOTAL HIP ARTHROPLASTY  2012   bilateral  TOTAL KNEE ARTHROPLASTY  2011, 2012   bilateral   TRANSTHORACIC ECHOCARDIOGRAM  10/05/2009   EF >55%, normal LV size, systolic, and diastolic function.   Social History:   reports that he has never smoked. He has never used smokeless tobacco. He reports that he does not drink alcohol and does not use drugs.  Family History  Problem Relation Age of Onset   Diabetes Mother    Arrhythmia Mother    Hypertension Mother    Dementia Father    Pulmonary embolism Father    Hypertension Father    Cancer Sister        Liver cancer - Histocytic lymphoma   Cancer Sister 38       Skin cancer   Hypertension  Sister 60   Diabetes Sister    Stroke Brother    Diabetes Brother    Hypertension Brother    Cancer Daughter    Cancer Maternal Grandmother        Skin cancer   Heart attack Maternal Grandfather    Cancer Paternal Grandfather    Hypertension Paternal Grandfather     Medications: Patient's Medications  New Prescriptions   No medications on file  Previous Medications   AMLODIPINE (NORVASC) 10 MG TABLET    TAKE 1 TABLET BY MOUTH EVERY DAY IN THE MORNING   ASPIRIN EC 81 MG TABLET    Take 81 mg by mouth every morning.   BENAZEPRIL (LOTENSIN) 40 MG TABLET    TAKE 1 TABLET BY MOUTH EVERY DAY   BRIMONIDINE (ALPHAGAN P) 0.1 % SOLN       DORZOLAMIDE-TIMOLOL (COSOPT) 22.3-6.8 MG/ML OPHTHALMIC SOLUTION    Place 2 drops into the right eye 2 (two) times daily.    ESOMEPRAZOLE (NEXIUM) 40 MG CAPSULE    TAKE 1 CAPSULE BY MOUTH AT BEDTIME. FOR REFLUX   EZETIMIBE (ZETIA) 10 MG TABLET    Take 1 tablet (10 mg total) by mouth daily.   FINASTERIDE (PROSCAR) 5 MG TABLET    TAKE 1 TABLET BY MOUTH EVERY DAY IN THE MORNING   FUROSEMIDE (LASIX) 20 MG TABLET    Take 1 tablet (20 mg total) every other day, alternating with 2 tablets (40 mg total) every other day.   METOPROLOL SUCCINATE (TOPROL XL) 25 MG 24 HR TABLET    Take 1.5 tablets (37.5 mg total) by mouth daily.   OMEGA-3 FATTY ACIDS (FISH OIL PO)    Take 1,400 mg by mouth.   POLYVINYL ALCOHOL-POVIDONE (REFRESH OP)    Place 1 drop into the right eye daily as needed (dry eyes/irritation).   SPIRONOLACTONE (ALDACTONE) 25 MG TABLET    TAKE 1 TABLET (25 MG TOTAL) BY MOUTH DAILY.  Modified Medications   No medications on file  Discontinued Medications   CYCLOBENZAPRINE (FLEXERIL) 10 MG TABLET    Take 10 mg by mouth 3 (three) times daily. As needed    Physical Exam:  Vitals:   03/03/21 0837  BP: 130/80  Pulse: 74  Temp: 97.8 F (36.6 C)  TempSrc: Temporal  SpO2: 97%  Weight: 286 lb 9.6 oz (130 kg)  Height: 5\' 10"  (1.778 m)   Body mass index is  41.12 kg/m. Wt Readings from Last 3 Encounters:  03/03/21 286 lb 9.6 oz (130 kg)  02/09/21 284 lb 3.2 oz (128.9 kg)  12/28/20 290 lb 6.4 oz (131.7 kg)    Physical Exam Vitals and nursing note reviewed.  Constitutional:      Appearance: Normal appearance.  HENT:  Ears:     Comments: Right TM shows increased vascular markings Left TM is dull There is no nystagmus Cardiovascular:     Rate and Rhythm: Normal rate and regular rhythm.  Pulmonary:     Effort: Pulmonary effort is normal.     Breath sounds: Normal breath sounds.  Musculoskeletal:     Right lower leg: No edema.     Left lower leg: No edema.  Neurological:     General: No focal deficit present.     Mental Status: He is alert and oriented to person, place, and time.    Labs reviewed: Basic Metabolic Panel: Recent Labs    03/30/20 0829 01/04/21 0835  NA 141 142  K 4.3 4.3  CL 108 106  CO2 26 24  GLUCOSE 105* 106*  BUN 17 15  CREATININE 1.01 1.05  CALCIUM 9.2 9.0   Liver Function Tests: No results for input(s): AST, ALT, ALKPHOS, BILITOT, PROT, ALBUMIN in the last 8760 hours. No results for input(s): LIPASE, AMYLASE in the last 8760 hours. No results for input(s): AMMONIA in the last 8760 hours. CBC: Recent Labs    03/01/21 0900  WBC 7.9  NEUTROABS 5,601  HGB 15.8  HCT 47.7  MCV 89.8  PLT 204   Lipid Panel: Recent Labs    03/30/20 0829 08/10/20 0806  CHOL 176 190  HDL 39* 44  LDLCALC 107* 113*  TRIG 178* 214*  CHOLHDL 4.5 4.3   TSH: No results for input(s): TSH in the last 8760 hours. A1C: Lab Results  Component Value Date   HGBA1C 5.3 03/30/2020     Assessment/Plan  1. Acute otitis media, unspecified otitis media type Patient has been on Augmentin for his bronchitis we will go ahead and refill that prescription today for the otitis - amoxicillin-clavulanate (AUGMENTIN) 875-125 MG tablet; Take 1 tablet by mouth every 12 (twelve) hours.  Dispense: 14 tablet; Refill: 0  2.  Primary hypertension Blood pressure doing great on current regimen.  Also combination of spironolactone and furosemide have helped edema  3. Morbid obesity ([place]) Weight is down 4 to 6 pounds since last visit.  That is likely related to fluid  4. Dizziness and giddiness Since there is a positional component likely is inner ear but also see infection in the right middle ear.  Gave him Epley exercises as well as refill of Augmentin   Alain Honey, MD [place] [phone]

## 2021-03-08 DIAGNOSIS — R1314 Dysphagia, pharyngoesophageal phase: Secondary | ICD-10-CM | POA: Diagnosis not present

## 2021-03-08 DIAGNOSIS — R933 Abnormal findings on diagnostic imaging of other parts of digestive tract: Secondary | ICD-10-CM | POA: Diagnosis not present

## 2021-03-08 DIAGNOSIS — K317 Polyp of stomach and duodenum: Secondary | ICD-10-CM | POA: Diagnosis not present

## 2021-03-08 DIAGNOSIS — Z86010 Personal history of colonic polyps: Secondary | ICD-10-CM | POA: Diagnosis not present

## 2021-03-08 LAB — HM COLONOSCOPY

## 2021-03-15 ENCOUNTER — Other Ambulatory Visit: Payer: Self-pay | Admitting: *Deleted

## 2021-03-15 DIAGNOSIS — K317 Polyp of stomach and duodenum: Secondary | ICD-10-CM | POA: Diagnosis not present

## 2021-03-15 MED ORDER — BENAZEPRIL HCL 40 MG PO TABS: 40.0000 mg | ORAL_TABLET | Freq: Every day | ORAL | 1 refills | Status: DC

## 2021-03-15 NOTE — Telephone Encounter (Signed)
Pharmacy requested refill.  Pended Refill and sent to [facility] for approval due to [place]. (Dr. Sabra Heck out of office)

## 2021-03-19 ENCOUNTER — Encounter: Payer: Self-pay | Admitting: *Deleted

## 2021-03-25 ENCOUNTER — Other Ambulatory Visit: Payer: Self-pay | Admitting: *Deleted

## 2021-03-25 MED ORDER — FINASTERIDE 5 MG PO TABS: ORAL_TABLET | ORAL | 1 refills | Status: DC

## 2021-03-25 MED ORDER — ESOMEPRAZOLE MAGNESIUM 40 MG PO CPDR: DELAYED_RELEASE_CAPSULE | ORAL | 1 refills | Status: DC

## 2021-03-25 NOTE — Addendum Note (Signed)
Addended by: Rafael Bihari A on: 03/25/2021 10:26 AM   Modules accepted: Orders

## 2021-03-25 NOTE — Telephone Encounter (Signed)
Pharmacy requested refill

## 2021-04-01 DIAGNOSIS — Z96652 Presence of left artificial knee joint: Secondary | ICD-10-CM | POA: Diagnosis not present

## 2021-04-01 DIAGNOSIS — Z96642 Presence of left artificial hip joint: Secondary | ICD-10-CM | POA: Diagnosis not present

## 2021-04-01 DIAGNOSIS — M7061 Trochanteric bursitis, right hip: Secondary | ICD-10-CM | POA: Diagnosis not present

## 2021-04-01 DIAGNOSIS — Z96651 Presence of right artificial knee joint: Secondary | ICD-10-CM | POA: Diagnosis not present

## 2021-04-01 DIAGNOSIS — Z96641 Presence of right artificial hip joint: Secondary | ICD-10-CM | POA: Diagnosis not present

## 2021-04-13 DIAGNOSIS — M19041 Primary osteoarthritis, right hand: Secondary | ICD-10-CM | POA: Diagnosis not present

## 2021-04-23 DIAGNOSIS — Z86010 Personal history of colonic polyps: Secondary | ICD-10-CM | POA: Diagnosis not present

## 2021-04-23 DIAGNOSIS — R1310 Dysphagia, unspecified: Secondary | ICD-10-CM | POA: Diagnosis not present

## 2021-04-23 DIAGNOSIS — K219 Gastro-esophageal reflux disease without esophagitis: Secondary | ICD-10-CM | POA: Diagnosis not present

## 2021-05-31 ENCOUNTER — Telehealth: Payer: Self-pay | Admitting: Cardiovascular Disease

## 2021-05-31 DIAGNOSIS — H40053 Ocular hypertension, bilateral: Secondary | ICD-10-CM | POA: Diagnosis not present

## 2021-05-31 NOTE — Telephone Encounter (Signed)
Pt c/o Shortness Of Breath: STAT if SOB developed within the last 24 hours or pt is noticeably SOB on the phone  1. Are you currently SOB (can you hear that pt is SOB on the phone)? Only on exertion   2. How long have you been experiencing SOB? Only on exertion   3. Are you SOB when sitting or when up moving around? Moving around   4. Are you currently experiencing any other symptoms? Fatigue

## 2021-05-31 NOTE — Telephone Encounter (Addendum)
Returned call to patient who states that he has been having more shortness of breath. Patient states his shortness of breath has worsened and states that he is still having some increased lower extremity edema as well. Patient reports that he has been taking his Lasix as ordered but state the shortness of breath has just gotten worse with exertion. Patient states that he has also been experiencing chest pressure that he describes as a fullness. Patient denies any sharp chest pain or radiating pain. Patient states that he has also gained around 8 pounds in the last week. Patient states that yesterday his blood pressure was 180/80 and was unsure of heart rate. Patient is unable to provide any readings from today. Made patient an appointment to see Roby Lofts, PA tomorrow 10/11 at 3:45. Made patient aware of ED precautions should new or worsening symptoms develop. Patient verbalized understanding.   Will forward to providers to make aware.

## 2021-06-01 ENCOUNTER — Ambulatory Visit: Payer: PPO | Admitting: Medical

## 2021-06-01 ENCOUNTER — Other Ambulatory Visit: Payer: Self-pay

## 2021-06-01 VITALS — BP 150/70 | HR 72 | Ht 70.0 in | Wt 292.6 lb

## 2021-06-01 DIAGNOSIS — I5033 Acute on chronic diastolic (congestive) heart failure: Secondary | ICD-10-CM

## 2021-06-01 DIAGNOSIS — I10 Essential (primary) hypertension: Secondary | ICD-10-CM | POA: Diagnosis not present

## 2021-06-01 DIAGNOSIS — Z79899 Other long term (current) drug therapy: Secondary | ICD-10-CM

## 2021-06-01 DIAGNOSIS — E782 Mixed hyperlipidemia: Secondary | ICD-10-CM

## 2021-06-01 DIAGNOSIS — E785 Hyperlipidemia, unspecified: Secondary | ICD-10-CM

## 2021-06-01 MED ORDER — CARVEDILOL 25 MG PO TABS: 12.5000 mg | ORAL_TABLET | Freq: Two times a day (BID) | ORAL | 6 refills | Status: DC

## 2021-06-01 MED ORDER — FUROSEMIDE 20 MG PO TABS
60.0000 mg | ORAL_TABLET | Freq: Every day | ORAL | 3 refills | Status: DC
Start: 2021-06-01 — End: 2021-06-22

## 2021-06-01 NOTE — Patient Instructions (Signed)
Medication Instructions:  STOP METOPROLOL  START FUROSEMIDE60MG  x3 DAYS THEN BACK TO 40MG  QD  *If you need a refill on your cardiac medications before your next appointment, please call your pharmacy*  Lab Work:    NONE    Testing/Procedures:  Echocardiogram (06-11-21 @ 3PM)- Your physician has requested that you have an echocardiogram. Echocardiography is a painless test that uses sound waves to create images of your heart. It provides your doctor with information about the size and shape of your heart and how well your heart's chambers and valves are working. This procedure takes approximately one hour. There are no restrictions for this procedure. This will be performed at our [facility] location - [address].  Follow-Up: Your next appointment:  06-22-2021 @  245PM  In Person with Roby Lofts, PA-C   At [facility], you and your health needs are our priority.  As part of our continuing mission to provide you with exceptional heart care, we have created designated Provider Care Teams.  These Care Teams include your primary Cardiologist (physician) and Advanced Practice Providers (APPs -  Physician Assistants and Nurse Practitioners) who all work together to provide you with the care you need, when you need it.

## 2021-06-01 NOTE — Progress Notes (Signed)
Cardiology Office Note   Date:  06/08/2021   ID:  Anthony Bern., DOB 07-07-1951, MRN [id number]  PCP:  Wardell Honour, MD  Cardiologist:  Shelva Majestic, MD EP: None  Chief Complaint  Patient presents with   Follow-up    C/O DOE x23mo AND LEFT SHOULDER PRESSURE x2-3 DAYS       History of Present Illness: Anthony [name]. is a 70 y.o. male with a PMH of non-obstructive CAD, chronic diastolic, CHF, HTN, HLD, GERD, BPH, depression, and morbid obesity, who presents for SOB.  He was last evaluated by cardiology at an outpatient visit with Coletta Memos, NP 02/09/21 at which time he was doing okay from a cardiac standpoint with improved BP readings and LE edema. He was working towards increasing his physical activity though was limited by joint pains and fatigue. No medication changes occurred and he was recommended to return in 6 months. His last echocardiogram 08/2018 showed EF 60-65%, G2DD, mild concentric LVH, no RWMA, moderate LAE, and mild MR, TR and PI. His last ischemic evaluation was a coronary CTA 08/2018 which showed a calcium score of 26.4 placing him in the 28th percentile for age/sex with minimal plaque in the ostial LAD and OM1.   He called the office 05/31/21 reporting worsening SOB prompting this appointment.  He presents today for the evaluation of progressive DOE. He reports worsening SOB for the past month or two, now to the point that he notices SOB when ambulating from his car to an office for doctors appointments as well as conversational SOB. He has orthopnea and LE edema but no PND. He has left upper chest discomfort which is mildly tender to palpitation and constant in nature over the past few days. No exertional CP. No complaints of palpitations, dizziness, lightheadedness, or syncope. He has an Epworth score of 12 though has not had a sleep study. That being said, he reports claustrophobia and would not be willing to trail CPAP. He has gained weight recently. He  reports eating meals from restaurants frequently. We discussed importance of limiting salt intake which adds to fluid buildup, weight gain, and worsening HTN. He reports BP was elevated to 180s/80 at his ophthalmology visit yesterday, often in the 140s-150s at home.    Past Medical History:  Diagnosis Date   Benign neoplasm of colon    Benign neoplasm of skin of upper limb, including shoulder    Carpal tunnel syndrome    Chest pain    a. normal cors by cath in 2015   Diaphragmatic hernia without mention of obstruction or gangrene    Dyslipidemia    Elevated prostate specific antigen (PSA)    GERD (gastroesophageal reflux disease)    Herpes zoster without mention of complication    History of hiatal hernia    Hypertension    Hypertrophy of prostate with urinary obstruction and other lower urinary tract symptoms (LUTS)    Impotence of organic origin    Lumbago    Obesity, unspecified    Osteoarthrosis, unspecified whether generalized or localized, lower leg    Other and unspecified hyperlipidemia    Other malaise and fatigue    Other seborrheic keratosis    Special screening for malignant neoplasm of prostate    Stenosis, cervical spine    Unspecified tinnitus     Past Surgical History:  Procedure Laterality Date   ANTERIOR CERVICAL DECOMP/DISCECTOMY FUSION N/A 02/19/2018   Procedure: ANTERIOR CERVICAL DECOMPRESSION/DISCECTOMY FUSION - CERVICAL FOUR-CERVICAL  FIVE - CERVICAL FIVE-CERVICAL SIX;  Surgeon: Earnie Larsson, MD;  Location: [facility];  Service: Neurosurgery;  Laterality: N/A;   CARDIOVASCULAR STRESS TEST  06/15/2012   Non-diagnostic for ischemia. No lexiscan EKG changes.   CHOLECYSTECTOMY  1992   EYE SURGERY     eyeband  2006   removal of right eyeband   INGUINAL HERNIA REPAIR  1984   left   INTRAOCULAR LENS REMOVAL  2008   right   LEFT HEART CATHETERIZATION WITH CORONARY ANGIOGRAM N/A 09/05/2013   Procedure: LEFT HEART CATHETERIZATION WITH CORONARY ANGIOGRAM;  Surgeon:  Troy Sine, MD;  Location: [facility] CATH LAB;  Service: Cardiovascular;  Laterality: N/A;   LOWER VENOUS EXTREMITY DOPPLER  01/26/2011   No evidence of thrombus or thrombophlebitis.   RETINAL DETACHMENT SURGERY  2001   right   TOTAL HIP ARTHROPLASTY  2012   bilateral   TOTAL KNEE ARTHROPLASTY  2011, 2012   bilateral   TRANSTHORACIC ECHOCARDIOGRAM  10/05/2009   EF >55%, normal LV size, systolic, and diastolic function.     Current Outpatient Medications  Medication Sig Dispense Refill   amLODipine (NORVASC) 10 MG tablet TAKE 1 TABLET BY MOUTH EVERY DAY IN THE MORNING 90 tablet 3   aspirin EC 81 MG tablet Take 81 mg by mouth every morning.     benazepril (LOTENSIN) 40 MG tablet Take 1 tablet (40 mg total) by mouth daily. 90 tablet 1   brimonidine (ALPHAGAN P) 0.1 % SOLN      carvedilol (COREG) 25 MG tablet Take 0.5 tablets (12.5 mg total) by mouth 2 (two) times daily. 30 tablet 6   dorzolamide-timolol (COSOPT) 22.3-6.8 MG/ML ophthalmic solution Place 2 drops into the right eye 2 (two) times daily.      esomeprazole (NEXIUM) 40 MG capsule TAKE 1 CAPSULE BY MOUTH AT BEDTIME. FOR REFLUX 90 capsule 1   ezetimibe (ZETIA) 10 MG tablet Take 1 tablet (10 mg total) by mouth daily. 90 tablet 1   finasteride (PROSCAR) 5 MG tablet TAKE 1 TABLET BY MOUTH EVERY DAY IN THE MORNING 90 tablet 1   Omega-3 Fatty Acids (FISH OIL PO) Take 1,400 mg by mouth.     Polyvinyl Alcohol-Povidone (REFRESH OP) Place 1 drop into the right eye daily as needed (dry eyes/irritation).     spironolactone (ALDACTONE) 25 MG tablet TAKE 1 TABLET (25 MG TOTAL) BY MOUTH DAILY. 90 tablet 3   furosemide (LASIX) 20 MG tablet Take 3 tablets (60 mg total) by mouth daily. 60MG  x3 DAYS THEN BACK TO 40MG  QD 33 tablet 3   No current facility-administered medications for this visit.    Allergies:   Lipitor [atorvastatin], Red yeast rice [cholestin], Statins, Codeine, and Latex    Social History:  The patient  reports that he has never  smoked. He has never used smokeless tobacco. He reports that he does not drink alcohol and does not use drugs.   Family History:  The patient's family history includes Arrhythmia in his mother; Cancer in his daughter, maternal grandmother, paternal grandfather, and sister; Cancer (age of onset: 25) in his sister; Dementia in his father; Diabetes in his brother, mother, and sister; Heart attack in his maternal grandfather; Hypertension in his brother, father, mother, and paternal grandfather; Hypertension (age of onset: 37) in his sister; Pulmonary embolism in his father; Stroke in his brother.    ROS:  Please see the history of present illness.   Otherwise, review of systems are positive for none.   All  other systems are reviewed and negative.    PHYSICAL EXAM: VS:  BP (!) 150/70 (BP Location: Left Arm, Patient Position: Sitting, Cuff Size: Large)   Pulse 72   Ht 5\' 10"  (1.778 m)   Wt 292 lb 9.6 oz (132.7 kg)   SpO2 95%   BMI 41.98 kg/m  , BMI Body mass index is 41.98 kg/m. GEN: Well nourished, well developed, in no acute distress HEENT: normal Neck: no JVD, carotid bruits, or masses Cardiac: RRR; no murmurs, rubs, or gallops, 1+ LE edema  Respiratory:  clear to auscultation bilaterally, normal work of breathing GI: soft, obese, nontender, + BS MS: no deformity or atrophy Skin: warm and dry, no rash Neuro:  Strength and sensation are intact Psych: euthymic mood, full affect   EKG:  EKG is ordered today. The ekg ordered today demonstrates sinus rhythm, rate 67 bpm, no STE/D, no TWI, no significant change from previous   Recent Labs: 03/01/2021: BUN 15; Creat 0.97; Hemoglobin 15.8; Platelets 204; Potassium 4.4; Sodium 142    Lipid Panel    Component Value Date/Time   CHOL 183 03/01/2021 0900   CHOL 173 12/02/2019 0844   CHOL 191 07/22/2013 0835   TRIG 137 03/01/2021 0900   TRIG 253 (H) 07/22/2013 0835   HDL 46 03/01/2021 0900   HDL 41 12/02/2019 0844   HDL 38 (L)  07/22/2013 0835   CHOLHDL 4.0 03/01/2021 0900   VLDL 35 (H) 10/27/2016 0825   LDLCALC 112 (H) 03/01/2021 0900   LDLCALC 102 (H) 07/22/2013 0835      Wt Readings from Last 3 Encounters:  06/01/21 292 lb 9.6 oz (132.7 kg)  03/03/21 286 lb 9.6 oz (130 kg)  02/09/21 284 lb 3.2 oz (128.9 kg)      Other studies Reviewed: Additional studies/ records that were reviewed today include:   Echocardiogram 08/2018: - Left ventricle: The cavity size was normal. There was mild    concentric hypertrophy. Systolic function was normal. The    estimated ejection fraction was in the range of 60% to 65%. Wall    motion was normal; there were no regional wall motion    abnormalities. Features are consistent with a pseudonormal left    ventricular filling pattern, with concomitant abnormal relaxation    and increased filling pressure (grade 2 diastolic dysfunction).    Doppler parameters are consistent with elevated ventricular    end-diastolic filling pressure.  - Aortic valve: There was no regurgitation.  - Mitral valve: There was mild regurgitation.  - Left atrium: The atrium was moderately dilated.  - Right ventricle: The cavity size was mildly dilated. Wall    thickness was normal. Systolic function was normal.  - Right atrium: The atrium was normal in size.  - Tricuspid valve: There was mild regurgitation.  - Pulmonic valve: There was mild regurgitation.  - Pulmonary arteries: Systolic pressure was within the normal    range. PA peak pressure: 35 mm Hg (S).  - Inferior vena cava: The vessel was normal in size.  - Pericardium, extracardiac: There was no pericardial effusion.  Coronary CTA 08/2018: 1. Coronary calcium score of 26.4. This was 28th percentile for age and sex matched control.   2. Normal coronary origin with right dominance.   3. Minimal plaque in ostial LAD and OM1.    ASSESSMENT AND PLAN:  1. Acute on chronic diastolic CHF: patient presents with progressive DOE for the  past 1-2 months. He has been taking lasix 40mg  QOD, previously  prescribed as daily. He has also had orthopnea and LE edema.  - Will update an echocardiogram to re-evaluate LV function, wall motion, and valve function - Will increase lasix to 60mg  x3 days then 40mg  daily going forward (had been taking lasix 40 QOD) - Will check BMET at follow-up for close monitoring - Low salt diet encouraged  2. Non-obstructive CAD: minimal CAD noted on coronary CTA in 2020. No exertional chest pain complaints - Continue aspirin  - Continue risk factor modifications to promote goal BP <130/80, LDL <70, and A1C <7  3. HTN: BP 150/70 today, recently in the 180s/80s at an ophthalmology visit.  - Will stop metoprolol and start carvedilol 12.5mg  BID for improved BP control  4. HLD: LDL 112 02/2021. He is intolerant to statins - Continue zetia  5. Suspected OSA: he reports snoring and Epworth score is 12. That being said he has claustrophobia and is not willing to try CPAP. Suspect OHS/OSA may be playing a roll in his DOE.  - Encourage dietary/lifestyle modifications to promote weight loss   Current medicines are reviewed at length with the patient today.  The patient does not have concerns regarding medicines.  The following changes have been made:  As above  Labs/ tests ordered today include:   Orders Placed This Encounter  Procedures   Lipid panel   Comprehensive metabolic panel   HgB L8G   ECHOCARDIOGRAM COMPLETE     Disposition:   FU with me in 2 weeks  Signed, Abigail Butts, PA-C  06/08/2021 9:39 PM

## 2021-06-11 ENCOUNTER — Other Ambulatory Visit: Payer: Self-pay

## 2021-06-11 ENCOUNTER — Ambulatory Visit (INDEPENDENT_AMBULATORY_CARE_PROVIDER_SITE_OTHER): Payer: PPO

## 2021-06-11 DIAGNOSIS — I5033 Acute on chronic diastolic (congestive) heart failure: Secondary | ICD-10-CM

## 2021-06-12 LAB — ECHOCARDIOGRAM COMPLETE
AR max vel: 1.87 cm2
AV Area VTI: 2.14 cm2
AV Area mean vel: 2.11 cm2
AV Mean grad: 5.5 mmHg
AV Peak grad: 12.8 mmHg
Ao pk vel: 1.79 m/s
Area-P 1/2: 3.01 cm2
Calc EF: 81.3 %
S' Lateral: 3.03 cm
Single Plane A2C EF: 65.8 %
Single Plane A4C EF: 81.5 %

## 2021-06-18 NOTE — Addendum Note (Signed)
Addended by: Waylan Rocher on: 06/18/2021 11:52 AM   Modules accepted: Orders

## 2021-06-22 ENCOUNTER — Other Ambulatory Visit: Payer: Self-pay

## 2021-06-22 ENCOUNTER — Ambulatory Visit: Payer: PPO | Admitting: Medical

## 2021-06-22 ENCOUNTER — Encounter: Payer: Self-pay | Admitting: Medical

## 2021-06-22 VITALS — BP 116/58 | HR 68 | Ht 70.0 in | Wt 281.0 lb

## 2021-06-22 DIAGNOSIS — I5032 Chronic diastolic (congestive) heart failure: Secondary | ICD-10-CM

## 2021-06-22 DIAGNOSIS — I10 Essential (primary) hypertension: Secondary | ICD-10-CM | POA: Diagnosis not present

## 2021-06-22 DIAGNOSIS — Z79899 Other long term (current) drug therapy: Secondary | ICD-10-CM | POA: Diagnosis not present

## 2021-06-22 DIAGNOSIS — R29818 Other symptoms and signs involving the nervous system: Secondary | ICD-10-CM

## 2021-06-22 DIAGNOSIS — E785 Hyperlipidemia, unspecified: Secondary | ICD-10-CM

## 2021-06-22 DIAGNOSIS — I2510 Atherosclerotic heart disease of native coronary artery without angina pectoris: Secondary | ICD-10-CM | POA: Diagnosis not present

## 2021-06-22 DIAGNOSIS — I5033 Acute on chronic diastolic (congestive) heart failure: Secondary | ICD-10-CM | POA: Diagnosis not present

## 2021-06-22 MED ORDER — FUROSEMIDE 40 MG PO TABS: 40.0000 mg | ORAL_TABLET | Freq: Every day | ORAL | 3 refills | Status: DC

## 2021-06-22 MED ORDER — CARVEDILOL 6.25 MG PO TABS: 6.2500 mg | ORAL_TABLET | Freq: Two times a day (BID) | ORAL | 3 refills | Status: DC

## 2021-06-22 NOTE — Progress Notes (Signed)
Cardiology Office Note   Date:  06/22/2021   ID:  Anthony [name]., DOB March 23, 1951, MRN [id number]  PCP:  Anthony Honour, MD  Cardiologist:  Anthony Majestic, MD EP: None  Chief Complaint  Patient presents with   Follow-up    Diastolic CHF       History of Present Illness: Anthony [name]. is a 70 y.o. male with a PMH of non-obstructive CAD, chronic diastolic, CHF, HTN, HLD, GERD, BPH, depression, and morbid obesity who presents for close follow-up of DOE.  He was last evaluated by cardiology at an outpatient visit with myself 06/01/2021 at which time he reported progressive DOE.  He also had lower extremity edema and orthopnea but no PND.  He reported some left upper chest discomfort that was constant in nature and associated with tenderness to palpation.  Reassuring for noncardiac pain as he had a coronary CTA 08/2018 which showed calcium score of 26.4 placing him in the 28th percentile for age/sex with minimal plaque in the ostial LAD and OM1 noted.  He had also been having elevated blood pressures recently.  He was recommended to increase Lasix to 60 mg x 3 days then 40 mg daily going forward (had been taking Lasix 40 mg every other day).  An echocardiogram was obtained 06/11/2021 which showed EF 60-65%, no RWMA, G1 DD, normal RV size/function, moderate LAE, and moderate dilation of the ascending aorta and aortic arch measuring 41 mm and 36 mm respectively.  He presents today for 2-week follow-up.  His weight has come down nicely to 281 pounds today from 292 pounds 06/01/2021.  He brings a log of his blood pressures which have been somewhat labile with SBP ranging from 90s on one occasion, up to 160s.  He notes some associated dizziness with SBP in the 110s and lower.  He has not had any presyncope or syncopal episodes.  He notes improvement in his lower extremity edema and on a couple occasions has taken additional Lasix on days when he notices swelling is worse.  We discussed  utilizing this method going forward to hopefully keep his fluid at bay.  He has continued to have some DOE which he was hopeful would be better.  Suspect some of this is deconditioning given reassuring echo above.  He has continued to have some chest discomfort though this remains nonexertional.  We discussed efforts for ongoing weight loss going forward.  He has already cut back on the number of times he eats out at [employer].  He is hopeful to get back into the gym.     Past Medical History:  Diagnosis Date   Benign neoplasm of colon    Benign neoplasm of skin of upper limb, including shoulder    Carpal tunnel syndrome    Chest pain    a. normal cors by cath in 2015   Diaphragmatic hernia without mention of obstruction or gangrene    Dyslipidemia    Elevated prostate specific antigen (PSA)    GERD (gastroesophageal reflux disease)    Herpes zoster without mention of complication    History of hiatal hernia    Hypertension    Hypertrophy of prostate with urinary obstruction and other lower urinary tract symptoms (LUTS)    Impotence of organic origin    Lumbago    Obesity, unspecified    Osteoarthrosis, unspecified whether generalized or localized, lower leg    Other and unspecified hyperlipidemia    Other malaise and fatigue  Other seborrheic keratosis    Special screening for malignant neoplasm of prostate    Stenosis, cervical spine    Unspecified tinnitus     Past Surgical History:  Procedure Laterality Date   ANTERIOR CERVICAL DECOMP/DISCECTOMY FUSION N/A 02/19/2018   Procedure: ANTERIOR CERVICAL DECOMPRESSION/DISCECTOMY FUSION - CERVICAL FOUR-CERVICAL FIVE - CERVICAL FIVE-CERVICAL SIX;  Surgeon: Earnie Larsson, MD;  Location: [place];  Service: Neurosurgery;  Laterality: N/A;   CARDIOVASCULAR STRESS TEST  06/15/2012   Non-diagnostic for ischemia. No lexiscan EKG changes.   CHOLECYSTECTOMY  1992   EYE SURGERY     eyeband  2006   removal of right eyeband   INGUINAL HERNIA  REPAIR  1984   left   INTRAOCULAR LENS REMOVAL  2008   right   LEFT HEART CATHETERIZATION WITH CORONARY ANGIOGRAM N/A 09/05/2013   Procedure: LEFT HEART CATHETERIZATION WITH CORONARY ANGIOGRAM;  Surgeon: Troy Sine, MD;  Location: [facility] CATH LAB;  Service: Cardiovascular;  Laterality: N/A;   LOWER VENOUS EXTREMITY DOPPLER  01/26/2011   No evidence of thrombus or thrombophlebitis.   RETINAL DETACHMENT SURGERY  2001   right   TOTAL HIP ARTHROPLASTY  2012   bilateral   TOTAL KNEE ARTHROPLASTY  2011, 2012   bilateral   TRANSTHORACIC ECHOCARDIOGRAM  10/05/2009   EF >55%, normal LV size, systolic, and diastolic function.     Current Outpatient Medications  Medication Sig Dispense Refill   amLODipine (NORVASC) 10 MG tablet TAKE 1 TABLET BY MOUTH EVERY DAY IN THE MORNING 90 tablet 3   aspirin EC 81 MG tablet Take 81 mg by mouth every morning.     benazepril (LOTENSIN) 40 MG tablet Take 1 tablet (40 mg total) by mouth daily. 90 tablet 1   brimonidine (ALPHAGAN P) 0.1 % SOLN      carvedilol (COREG) 6.25 MG tablet Take 1 tablet (6.25 mg total) by mouth 2 (two) times daily. 180 tablet 3   dorzolamide-timolol (COSOPT) 22.3-6.8 MG/ML ophthalmic solution Place 2 drops into the right eye 2 (two) times daily.      esomeprazole (NEXIUM) 40 MG capsule TAKE 1 CAPSULE BY MOUTH AT BEDTIME. FOR REFLUX (Patient taking differently: in the morning and at bedtime. TAKE 1 CAPSULE BY MOUTH AT BEDTIME. FOR REFLUX) 90 capsule 1   ezetimibe (ZETIA) 10 MG tablet Take 1 tablet (10 mg total) by mouth daily. 90 tablet 1   finasteride (PROSCAR) 5 MG tablet TAKE 1 TABLET BY MOUTH EVERY DAY IN THE MORNING 90 tablet 1   furosemide (LASIX) 40 MG tablet Take 1 tablet (40 mg total) by mouth daily. Take an additional 20 mg (0.5 tablet) for weight again. (3 pounds overnight or 5 pounds in 1 week.) 105 tablet 3   Omega-3 Fatty Acids (FISH OIL PO) Take 1,400 mg by mouth.     Polyvinyl Alcohol-Povidone (REFRESH OP) Place 1 drop into  the right eye daily as needed (dry eyes/irritation).     spironolactone (ALDACTONE) 25 MG tablet TAKE 1 TABLET (25 MG TOTAL) BY MOUTH DAILY. 90 tablet 3   No current facility-administered medications for this visit.    Allergies:   Lipitor [atorvastatin], Red yeast rice [cholestin], Statins, Codeine, and Latex    Social History:  The patient  reports that he has never smoked. He has never used smokeless tobacco. He reports that he does not drink alcohol and does not use drugs.   Family History:  The patient's family history includes Arrhythmia in his mother; Cancer in his daughter,  maternal grandmother, paternal grandfather, and sister; Cancer (age of onset: 52) in his sister; Dementia in his father; Diabetes in his brother, mother, and sister; Heart attack in his maternal grandfather; Hypertension in his brother, father, mother, and paternal grandfather; Hypertension (age of onset: 62) in his sister; Pulmonary embolism in his father; Stroke in his brother.    ROS:  Please see the history of present illness.   Otherwise, review of systems are positive for none.   All other systems are reviewed and negative.    PHYSICAL EXAM: VS:  BP (!) 116/58 (BP Location: Left Arm, Patient Position: Sitting)   Pulse 68   Ht 5\' 10"  (1.778 m)   Wt 281 lb (127.5 kg)   SpO2 97%   BMI 40.32 kg/m  , BMI Body mass index is 40.32 kg/m. GEN: Well nourished, well developed, in no acute distress HEENT: Sclera anicteric Neck: no JVD, carotid bruits, or masses Cardiac: RRR; no murmurs, rubs, or gallops, trace lower extremity edema  Respiratory:  clear to auscultation bilaterally, normal work of breathing GI: soft, nontender, nondistended, + BS MS: no deformity or atrophy Skin: warm and dry, no rash Neuro:  Strength and sensation are intact Psych: euthymic mood, full affect   EKG:  EKG is not ordered today.   Recent Labs: 03/01/2021: BUN 15; Creat 0.97; Hemoglobin 15.8; Platelets 204; Potassium 4.4;  Sodium 142    Lipid Panel    Component Value Date/Time   CHOL 183 03/01/2021 0900   CHOL 173 12/02/2019 0844   CHOL 191 07/22/2013 0835   TRIG 137 03/01/2021 0900   TRIG 253 (H) 07/22/2013 0835   HDL 46 03/01/2021 0900   HDL 41 12/02/2019 0844   HDL 38 (L) 07/22/2013 0835   CHOLHDL 4.0 03/01/2021 0900   VLDL 35 (H) 10/27/2016 0825   LDLCALC 112 (H) 03/01/2021 0900   LDLCALC 102 (H) 07/22/2013 0835      Wt Readings from Last 3 Encounters:  06/22/21 281 lb (127.5 kg)  06/01/21 292 lb 9.6 oz (132.7 kg)  03/03/21 286 lb 9.6 oz (130 kg)      Other studies Reviewed: Additional studies/ records that were reviewed today include:   Echocardiogram 06/11/2021: 1. Left ventricular ejection fraction, by estimation, is 60 to 65%. The  left ventricle has normal function. The left ventricle has no regional  wall motion abnormalities. Left ventricular diastolic parameters are  consistent with Grade I diastolic  dysfunction (impaired relaxation).   2. Right ventricular systolic function is normal. The right ventricular  size is normal. There is mildly elevated pulmonary artery systolic  pressure.   3. Left atrial size was moderately dilated.   4. The mitral valve is normal in structure. No evidence of mitral valve  regurgitation. No evidence of mitral stenosis.   5. The aortic valve is normal in structure. Aortic valve regurgitation is  trivial. Mild aortic valve sclerosis is present, with no evidence of  aortic valve stenosis.   6. Aortic dilatation noted. There is moderate dilatation of the ascending  aorta, measuring 41 mm. There is mild dilatation of the aortic arch,  measuring 36 mm.   7. The inferior vena cava is dilated in size with >50% respiratory  variability, suggesting right atrial pressure of 8 mmHg.    Coronary CTA 08/2018: 1. Coronary calcium score of 26.4. This was 28th percentile for age and sex matched control.   2. Normal coronary origin with right  dominance.   3. Minimal plaque in ostial  LAD and OM1.   ASSESSMENT AND PLAN:  1. Chronic diastolic CHF: Weight 681 lbs today down from 292 pounds 06/01/2021.  Recent echo with EF 60 to 65% and G1DD. -  Continue Lasix 40 mg daily with plans to take an additional 20 mg for weight gain of 3 pounds overnight or 5 pounds in a week or worsening lower extremity edema. - Will check BMET for close monitoring of kidney function and electrolytes - Low salt diet encouraged   2. Non-obstructive CAD: minimal CAD noted on coronary CTA in 2020. No exertional chest pain complaints.  Recent echo with EF 60 to 65% and no RWMA - Continue aspirin  - Continue risk factor modifications to promote goal BP <130/80, LDL <70, and A1C <7   3. HTN: BP 116/58 today, much improved from 2 weeks ago though he has been having some intermittent dizziness with SBP 110s and lower - Will decrease carvedilol to 6.25 mg BID  - Encouraged ongoing monitoring at home with plans to notify the office if consistently > 130/80   4. HLD: LDL 112 02/2021. He is intolerant to statins - Continue zetia - Would have low threshold to refer to lipid clinic for next cholesterol check has not improved with diet and exercise   5. Suspected OSA: he reports snoring and Epworth score is 12. That being said he has claustrophobia and is not willing to try CPAP. Suspect OHS/OSA may be playing a roll in his DOE.  - Encourage dietary/lifestyle modifications to promote weight loss   Current medicines are reviewed at length with the patient today.  The patient does not have concerns regarding medicines.  The following changes have been made: As above  Labs/ tests ordered today include:   Orders Placed This Encounter  Procedures   Basic metabolic panel   Magnesium     Disposition:   FU with Dr. Claiborne Billings in 6 months  Signed, Abigail Butts, PA-C  06/22/2021 3:55 PM

## 2021-06-22 NOTE — Patient Instructions (Signed)
Medication Instructions:  Your physician has recommended you make the following change in your medication:  CHANGE: Lasix 40 mg daily. Take an additional 20 mg (0.5 tablet) for weight again. (3 pounds overnight or 5 pounds in 1 week.)  CHANGE: Carvedilol (Coreg) 6.25 mg twice daily *If you need a refill on your cardiac medications before your next appointment, please call your pharmacy*   Lab Work: Your physician recommends that you return for lab work in:  TODAY: BMET, [place] If you have labs (blood work) drawn today and your tests are completely normal, you will receive your results only by: [place] (if you have MyChart) OR A paper copy in the mail If you have any lab test that is abnormal or we need to change your treatment, we will call you to review the results.   Testing/Procedures: None   Follow-Up: At [facility], you and your health needs are our priority.  As part of our continuing mission to provide you with exceptional heart care, we have created designated Provider Care Teams.  These Care Teams include your primary Cardiologist (physician) and Advanced Practice Providers (APPs -  Physician Assistants and Nurse Practitioners) who all work together to provide you with the care you need, when you need it.  We recommend signing up for the patient portal called "MyChart".  Sign up information is provided on this After Visit Summary.  MyChart is used to connect with patients for Virtual Visits (Telemedicine).  Patients are able to view lab/test results, encounter notes, upcoming appointments, etc.  Non-urgent messages can be sent to your provider as well.   To learn more about what you can do with MyChart, go to [email or web address].    Your next appointment:   6 month(s)  The format for your next appointment:   In Person  Provider:   Shelva Majestic, MD   Other Instructions

## 2021-06-23 LAB — BASIC METABOLIC PANEL
BUN/Creatinine Ratio: 20 (ref 10–24)
BUN: 23 mg/dL (ref 8–27)
CO2: 24 mmol/L (ref 20–29)
Calcium: 9.3 mg/dL (ref 8.6–10.2)
Chloride: 106 mmol/L (ref 96–106)
Creatinine, Ser: 1.17 mg/dL (ref 0.76–1.27)
Glucose: 84 mg/dL (ref 70–99)
Potassium: 4.8 mmol/L (ref 3.5–5.2)
Sodium: 142 mmol/L (ref 134–144)
eGFR: 66 mL/min/{1.73_m2} (ref >59)

## 2021-06-23 LAB — MAGNESIUM: Magnesium: 2.1 mg/dL (ref 1.6–2.3)

## 2021-07-15 ENCOUNTER — Other Ambulatory Visit: Payer: Self-pay | Admitting: Cardiovascular Disease

## 2021-07-15 ENCOUNTER — Other Ambulatory Visit: Payer: Self-pay | Admitting: Family Medicine

## 2021-07-15 DIAGNOSIS — I10 Essential (primary) hypertension: Secondary | ICD-10-CM

## 2021-07-15 DIAGNOSIS — E7849 Other hyperlipidemia: Secondary | ICD-10-CM

## 2021-07-16 ENCOUNTER — Other Ambulatory Visit: Payer: Self-pay | Admitting: Medical

## 2021-08-02 ENCOUNTER — Encounter: Payer: Self-pay | Admitting: Cardiovascular Disease

## 2021-08-02 ENCOUNTER — Other Ambulatory Visit: Payer: Self-pay

## 2021-08-02 ENCOUNTER — Ambulatory Visit: Payer: PPO | Admitting: Cardiovascular Disease

## 2021-08-02 VITALS — BP 130/70 | HR 75 | Ht 70.0 in | Wt 281.0 lb

## 2021-08-02 DIAGNOSIS — Z789 Other specified health status: Secondary | ICD-10-CM | POA: Diagnosis not present

## 2021-08-02 DIAGNOSIS — I5032 Chronic diastolic (congestive) heart failure: Secondary | ICD-10-CM | POA: Diagnosis not present

## 2021-08-02 DIAGNOSIS — I10 Essential (primary) hypertension: Secondary | ICD-10-CM

## 2021-08-02 DIAGNOSIS — E785 Hyperlipidemia, unspecified: Secondary | ICD-10-CM

## 2021-08-02 DIAGNOSIS — I2510 Atherosclerotic heart disease of native coronary artery without angina pectoris: Secondary | ICD-10-CM

## 2021-08-02 NOTE — Progress Notes (Signed)
Patient ID: Anthony [name]., male   DOB: [birth year], 70 y.o.   MRN: [id number]    HPI: Anthony [name]. is a 70 y.o. male who presents to the office for a 10 month  follow-up cardiology evaluation.   [name] has a history of hypertension, mild obesity, as well as hyperlipidemia. In the past, he did develop CPK elevation secondary to statin therapy. He has been able to tolerate Zetia. A nuclear perfusion study done in October 2013 for chest pain showed normal perfusion without scar or ischemia.  Laboratory in October 2014 showed cholesterol 196, triglycerides significantly elevated at 291, HDL of 34, VLDL increased at 58, and  LDL 106. Renal function was normal. BUN 15 kerning 0.79.   He developed recurrent episodes of chest pain for which he saw Anthony [name] and because of exertionally precipitated episodes of discomfort definitive cardiac catheterization was recommended. This was done by me on 09/05/2013 and revealed normal coronary arteries with normal LV function without focal segmental wall motion abnormalities. Ejection fraction was 55%.  He underwent an echo Doppler study on 09/04/2013 which showed an ejection fraction of 55-60%. He did have grade 1 diastolic dysfunction and mild left ventricular hypertrophy. He had mild mitral annular calcification with mildly thickened leaflets and trivial mitral regurgitation. A moderate pressure estimate was 27 mm.  He was  seen by [name] on 08/17/2016 with complaints of chest discomfort.  He's episodes would occur often when he was walking on his driveway her in the grocery store and had been ongoing for 1-2 months.  He had diffuse tenderness to palpation along his left pectoral region.  On exam it was felt that some of his chest pain was musculoskeletal in etiology.  He was hypertensive and she further titrated amlodipine.  Because of exertional dyspnea.  She referred him for an echo Doppler study which was done on 08/23/2016.  This showed mild  LVH with normal systolic function and grade 1 diastolic dysfunction.  There is very mild increased peak PA pressure 34 mm.  I saw him in January 2019 after not having seen him since February 2015.  At that time, his pressure was elevated despite taking amlodipine 7.5 mg.  I recommended further titration to 10 mg daily.  On this increased dose, he states his blood pressure at home typically has been running in the 130-135 range.  He denies chest pain PND orthopnea.  He has a history of GERD which is controlled with Nexium.  He has had some issues with left arm paresthesias and was diagnosed with significant cervical degenerative disc disease.  He is scheduled to undergo neck surgery on December 12, 2017 by Dr. Trenton Gammon involving C4-5 and C5-6.  Preoperative surgical clearance was recommended.  He denies any chest pain or shortness of breath.  He has not been exercising as he had in the past in the winter months but hopes to do so following his surgery and with improved weather.    When I saw him in April 2019 he was given surgical clearance for his cervical surgery.  He underwent successful surgery by Dr. Trenton Gammon in July 2019 involving C4-5 and C5-6 and tolerated this well.   I saw him in January 2020 at that time he had begun to notice some episodes of chest wall discomfort.  He denied any clear-cut exertional precipitation and noted the chest discomfort intermittently.  He was unable to walk well secondary to bilateral knee and hip replacement surgery.  He  also had experience of mild exertional shortness of breath.  I felt most likely that his chest pain was of musculoskeletal etiology.  However with his exertional dyspnea I recommended reevaluation of his coronary anatomy and suggested CT coronary angiography and also a 2-year follow-up echo Doppler assessment.  His echo study was done on August 30, 2018 and showed an EF of 60 to 65% without wall motion abnormalities.  There was grade 2 diastolic dysfunction.  There  was mild increased PA pressure at 35 mm.  There was mild MR.  Coronary CTA yielded calcium score of 26.4 which was 20th percentile for age and sex matched control.  He had normal coronary origin with right dominance.  There was minimal plaque in the ostial LAD and OM1.  CT demonstrated atherosclerotic disease in the descending thoracic aorta.  I saw him on March 04, 2019 at which time he denied any chest pain, palpitations or dyspnea.  He was experiencing bilateral leg swelling despite taking HCTZ.   During this COVID pandemic, he has gained approximately 15 pounds.  He has not been exercising due to hip discomfort.  Laboratory in March 2020 showed a total cholesterol 173, triglycerides 143 HDL 46 and LDL 103.  During that evaluation I recommended he discontinue hydrochlorothiazide and in its place start furosemide 40 mg for 2 days and then 20 mg daily.  I saw him in follow-up on September 03, 2019.  Prior to that evaluation he was  on amlodipine 10 mg, benazepril 40 mg, furosemide 20 mg daily.  At times he does experience shortness of breath and notes swelling right leg greater than left.  He has been able to take Zetia 10 mg in addition to very low-dose rosuvastatin 5 mg once a week and is tolerating this well.  Recently he has noticed slight increase in the shortness of breath.  He admits to further weight gain.  During that evaluation with his shortness of breath spironolactone 12.5 mg was added to his medical regimen both from previous improved blood pressure control as well as potential improvement in diastolic dysfunction contributing to his exertional dyspnea.  Laboratory 1 month ago showed total cholesterol 177, HDL 40 triglycerides 182 LDL 107.  BUN 13 creatinine 0.96.  I saw him in follow-up on October 07, 2019.  Repeat chemistry profile showed a potassium of 4.5.  BUN 13 and creatinine 1.06.  Over the prior month, he has had purposeful weight loss contributed by reduction in his meal servings.  He  did receive his Covid vaccination.  He did experience some nonexertional shoulder chest soreness following the vaccination.  He denies any exertional chest pain symptomatology.  During his most recent evaluation blood pressure was improved but was still 138/78 and I recommended slight additional titration of spironolactone to 25 mg daily.  I saw him in June 2021 at that time he was experiencing mild chest tightness for 3 to 4 weeks.   The symptoms are not always exertional.  He admits to decreased energy.  He has been having difficulty with reflux.  He also experiences some sharp pain discomfort going to his back which is nonexertional.  He has been on amlodipine 10 mg, benazepril 40 mg, furosemide 20 mg daily and spironolactone 25 mg daily for his blood pressure and shortness of breath.  He was no longer taking low-dose Crestor and has continued to be on Zetia.  Lipid studies on December 02, 2019 showed an LDL cholesterol that had risen to 107.  Total cholesterol  was 173 triglycerides 139.  He sees Dr. Cristina Gong who may want to do esophageal dilatation in the future.  He admitted to fatigability, no energy, nocturia at least 3-4 times per night as well as the need to take a nap every day.  During that evaluation, since he had stopped statin therapy I recommended neclizet at 180/10 which is combination bempedoic acid plus Zetia in 1 pill.  We also discussed possible sleep apnea and the importance of weight loss.  When I saw him in September 2021 he told me he could not tolerate bempedoic acid due to development of abdominal cramps and for this reason has only been on Zetia.  He continues to have GERD but is not having any anginal symptoms.  He has difficulty walking secondary to his bilateral knee replacements and hip surgery.  He continues to experience daytime sleepiness, nocturia, and admits to snoring.   I last evaluated him on September 28, 2020.  He never pursued a sleep evaluation.  At that time he felt he was  sleeping well sleeping well and typically sleeps between 8 and 9 hours per night.  He denied any exertional anginal symptomatology but at times notes a very sharp twinge anteriorly.  He continues to have issues with his neck and will be undergoing an ablation procedure later this week.  He has not been exercising much due to arthritic issues and he is status post bilateral knee and hip replacement surgeries.  He continues to be on amlodipine 10 mg, benazepril 40 mg, furosemide 20 mg, and spironolactone 25 mg daily.  He continues to be on ezetimibe and is tolerating this well.  He had laboratory by his primary physician in on August 10, 2020 which showed total cholesterol 190, HDL 44, LDL 113, triglycerides 214.   Since I last saw him he had undergone evaluations with Coletta Memos, NP and Roby Lofts, PA.  Presently, he denies any anginal type chest pain or shortness of breath.  He has a remote history of bilateral knee and hip replacements and as result does not walk much.  He continues to be on amlodipine 10 mg, benazepril 40 mg, carvedilol 6.25 mg twice a day, furosemide 40 mg and spironolactone 25 mg daily.  He continues to be on Zetia and over-the-counter fish oil for hyperlipidemia.  At times he has taken extra Lasix if there was increasing edema.  He has not been successful with weight loss.  Past Medical History:  Diagnosis Date   Benign neoplasm of colon    Benign neoplasm of skin of upper limb, including shoulder    Carpal tunnel syndrome    Chest pain    a. normal cors by cath in 2015   Diaphragmatic hernia without mention of obstruction or gangrene    Dyslipidemia    Elevated prostate specific antigen (PSA)    GERD (gastroesophageal reflux disease)    Herpes zoster without mention of complication    History of hiatal hernia    Hypertension    Hypertrophy of prostate with urinary obstruction and other lower urinary tract symptoms (LUTS)    Impotence of organic origin    Lumbago     Obesity, unspecified    Osteoarthrosis, unspecified whether generalized or localized, lower leg    Other and unspecified hyperlipidemia    Other malaise and fatigue    Other seborrheic keratosis    Special screening for malignant neoplasm of prostate    Stenosis, cervical spine    Unspecified tinnitus  Past Surgical History:  Procedure Laterality Date   ANTERIOR CERVICAL DECOMP/DISCECTOMY FUSION N/A 02/19/2018   Procedure: ANTERIOR CERVICAL DECOMPRESSION/DISCECTOMY FUSION - CERVICAL FOUR-CERVICAL FIVE - CERVICAL FIVE-CERVICAL SIX;  Surgeon: Earnie Larsson, MD;  Location: [place];  Service: Neurosurgery;  Laterality: N/A;   CARDIOVASCULAR STRESS TEST  06/15/2012   Non-diagnostic for ischemia. No lexiscan EKG changes.   CHOLECYSTECTOMY  1992   EYE SURGERY     eyeband  2006   removal of right eyeband   INGUINAL HERNIA REPAIR  1984   left   INTRAOCULAR LENS REMOVAL  2008   right   LEFT HEART CATHETERIZATION WITH CORONARY ANGIOGRAM N/A 09/05/2013   Procedure: LEFT HEART CATHETERIZATION WITH CORONARY ANGIOGRAM;  Surgeon: Troy Sine, MD;  Location: [facility] CATH LAB;  Service: Cardiovascular;  Laterality: N/A;   LOWER VENOUS EXTREMITY DOPPLER  01/26/2011   No evidence of thrombus or thrombophlebitis.   RETINAL DETACHMENT SURGERY  2001   right   TOTAL HIP ARTHROPLASTY  2012   bilateral   TOTAL KNEE ARTHROPLASTY  2011, 2012   bilateral   TRANSTHORACIC ECHOCARDIOGRAM  10/05/2009   EF >55%, normal LV size, systolic, and diastolic function.    Allergies  Allergen Reactions   Lipitor [Atorvastatin] Other (See Comments)    Leg pain/cramps; statin myopathy   Red Yeast Rice [Cholestin] Other (See Comments)    Makes legs hurt, myopathy   Statins Other (See Comments)    "muscle break down", statin myopathy   Statins Support [Acid Blockers Support] Other (See Comments)   Codeine Other (See Comments)    Headache   Latex Rash    Current Outpatient Medications  Medication Sig Dispense Refill    amLODipine (NORVASC) 10 MG tablet TAKE 1 TABLET BY MOUTH EVERY DAY IN THE MORNING 90 tablet 3   aspirin EC 81 MG tablet Take 81 mg by mouth every morning.     benazepril (LOTENSIN) 40 MG tablet Take 1 tablet (40 mg total) by mouth daily. 90 tablet 1   brimonidine (ALPHAGAN P) 0.1 % SOLN      carvedilol (COREG) 6.25 MG tablet Take 1 tablet (6.25 mg total) by mouth 2 (two) times daily. 180 tablet 3   dorzolamide-timolol (COSOPT) 22.3-6.8 MG/ML ophthalmic solution Place 2 drops into the right eye 2 (two) times daily.      esomeprazole (NEXIUM) 40 MG capsule TAKE 1 CAPSULE BY MOUTH AT BEDTIME. FOR REFLUX (Patient taking differently: in the morning and at bedtime. TAKE 1 CAPSULE BY MOUTH AT BEDTIME. FOR REFLUX) 90 capsule 1   ezetimibe (ZETIA) 10 MG tablet TAKE 1 TABLET BY MOUTH EVERY DAY 90 tablet 2   finasteride (PROSCAR) 5 MG tablet TAKE 1 TABLET BY MOUTH EVERY DAY IN THE MORNING 90 tablet 1   furosemide (LASIX) 40 MG tablet Take 1 tablet (40 mg total) by mouth daily. Take an additional 20 mg (0.5 tablet) for weight again. (3 pounds overnight or 5 pounds in 1 week.) 105 tablet 3   Omega-3 Fatty Acids (FISH OIL PO) Take 1,400 mg by mouth.     Polyvinyl Alcohol-Povidone (REFRESH OP) Place 1 drop into the right eye daily as needed (dry eyes/irritation).     spironolactone (ALDACTONE) 25 MG tablet TAKE 1 TABLET (25 MG TOTAL) BY MOUTH DAILY. 90 tablet 3   No current facility-administered medications for this visit.    Social History   Socioeconomic History   Marital status: Married    Spouse name: Not on file   Number  of children: Not on file   Years of education: Not on file   Highest education level: Not on file  Occupational History   Occupation: retired Medical sales representative     Comment: Hospital doctor  Tobacco Use   Smoking status: Never   Smokeless tobacco: Never  Vaping Use   Vaping Use: Never used  Substance and Sexual Activity   Alcohol use: No   Drug use: No   Sexual activity: Yes     Partners: Female    Comment: wife  Other Topics Concern   Not on file  Social History Narrative   Married   Never smoked   Alcohol none   Exercise -gym 3 days a week (join 3 months ago)   Living Will   Social Determinants of Health   Financial Resource Strain: Not on file  Food Insecurity: Not on file  Transportation Needs: Not on file  Physical Activity: Not on file  Stress: Not on file  Social Connections: Not on file  Intimate Partner Violence: Not on file   Social is notable that he is married has 2 children and 2 grandchildren. He is not routinely exercise. There is no tobacco use. He does drink occasional alcohol.  Family History  Problem Relation Age of Onset   Diabetes Mother    Arrhythmia Mother    Hypertension Mother    Dementia Father    Pulmonary embolism Father    Hypertension Father    Cancer Sister        Liver cancer - Histocytic lymphoma   Cancer Sister 55       Skin cancer   Hypertension Sister 35   Diabetes Sister    Stroke Brother    Diabetes Brother    Hypertension Brother    Cancer Daughter    Cancer Maternal Grandmother        Skin cancer   Heart attack Maternal Grandfather    Cancer Paternal Grandfather    Hypertension Paternal Grandfather     ROS General: Negative; No fevers, chills, or night sweats;  positive for a 16 pound weight loss in September 2018.  Positive for fatigue HEENT: Negative; No changes in vision or hearing, sinus congestion, difficulty swallowing Pulmonary: Negative; No cough, wheezing, shortness of breath, hemoptysis Cardiovascular: Positive for occasional exertional shortness of breath.  No exertional chest pain.  Positive for right leg greater than left swelling GI: Positive for GERD  GU: Positive for nocturia; BPH Musculoskeletal: Lateral hip and knee discomfort; status post recent surgery C4-5/C5-6 Hematologic/Oncology: Negative; no easy bruising, bleeding Endocrine: Negative; no heat/cold intolerance; no  diabetes Neuro: Mild left arm paresthesias from his cervical degenerative disease Skin: Negative; No rashes or skin lesions Psychiatric: Negative; No behavioral problems, depression Sleep: Negative; No snoring, daytime sleepiness, hypersomnolence, bruxism, restless legs, hypnogognic hallucinations, no cataplexy Other comprehensive 14 point system review is negative.  PE BP 130/70 (BP Location: Left Arm)   Pulse 75   Ht '5\' 10"'  (1.778 m)   Wt 281 lb (127.5 kg)   SpO2 96%   BMI 40.32 kg/m    Repeat blood pressure by me was 135/72  Wt Readings from Last 3 Encounters:  08/02/21 281 lb (127.5 kg)  06/22/21 281 lb (127.5 kg)  06/01/21 292 lb 9.6 oz (132.7 kg)   General: Alert, oriented, no distress.  Skin: normal turgor, no rashes, warm and dry HEENT: Normocephalic, atraumatic. Pupils equal round and reactive to light; sclera anicteric; extraocular muscles intact;  Nose without nasal  septal hypertrophy Mouth/Parynx benign; Mallinpatti scale 3/4 Neck: No JVD, no carotid bruits; normal carotid upstroke Lungs: clear to ausculatation and percussion; no wheezing or rales Chest wall: without tenderness to palpitation Heart: PMI not displaced, RRR, s1 s2 normal, 1/6 systolic murmur, no diastolic murmur, no rubs, gallops, thrills, or heaves Abdomen: soft, nontender; no hepatosplenomehaly, BS+; abdominal aorta nontender and not dilated by palpation. Back: no CVA tenderness Pulses 2+ Musculoskeletal: full range of motion, normal strength, no joint deformities Extremities: no clubbing cyanosis or edema, Homan's sign negative  Neurologic: grossly nonfocal; Cranial nerves grossly wnl Psychologic: Normal mood and affect  ECG (independently read by me): Normal sinus rhythm at 74 bpm.  No ectopy.  Normal intervals.  September 2021 ECG (independently read by me): Normal sinus rhythm at 82 bpm.  No significant ST-T change.  Normal intervals.  No ectopy  June 2021 ECG (independently read by me):  NSR at 73;  No ectopy, no ST changes, normal intervals  January 12,2021 ECG (independently read by me): Normal sinus rhythm at 81 bpm.  No ectopy.  Normal intervals.  July 2020 ECG (independently read by me): NSR at 70  January 2020 ECG (independently read by me): Normal sinus rhythm at 74 bpm.  No ectopy.  No ST segment changes.  April 2019 ECG (independently read by me): Normal sinus rhythm at 66 bpm.  Normal intervals.  No ectopy.  No ST segment changes.  August 28, 2017 ECG (independently read by me): Normal sinus rhythm at 63 bpm.  Normal intervals.  No ST segment changes.  February 2015 ECG (independently read by me): Normal sinus rhythm at 66 beats per minute. PR interval 160 ms. QTC intervals 34 ms. No significant ST-T changes.  November 2014 ECG (independently read by me)::Normal sinus rhythm at 69 beats per minute. No ectopy. Normal intervals.  LABS:  BMP Latest Ref Rng & Units 06/22/2021 03/01/2021 01/04/2021  Glucose 70 - 99 mg/dL 84 109(H) 106(H)  BUN 8 - 27 mg/dL '23 15 15  ' Creatinine 0.76 - 1.27 mg/dL 1.17 0.97 1.05  BUN/Creat Ratio 10 - 24 20 NOT APPLICABLE 14  Sodium 022 - 144 mmol/L 142 142 142  Potassium 3.5 - 5.2 mmol/L 4.8 4.4 4.3  Chloride 96 - 106 mmol/L 106 110 106  CO2 20 - 29 mmol/L '24 28 24  ' Calcium 8.6 - 10.2 mg/dL 9.3 9.1 9.0   CBC Latest Ref Rng & Units 03/01/2021 11/25/2019 07/22/2019  WBC 3.8 - 10.8 Thousand/uL 7.9 7.8 7.0  Hemoglobin 13.2 - 17.1 g/dL 15.8 16.1 15.9  Hematocrit 38.5 - 50.0 % 47.7 47.5 47.1  Platelets 140 - 400 Thousand/uL 204 220 205   Lab Results  Component Value Date   TSH 3.27 11/25/2019    Lab Results  Component Value Date   HGBA1C 5.4 03/01/2021   Hepatic Function Latest Ref Rng & Units 12/02/2019 11/25/2019 07/22/2019  Total Protein 6.0 - 8.5 g/dL 5.5(L) 5.9(L) 5.6(L)  Albumin 3.8 - 4.8 g/dL 4.1 - -  AST 0 - 40 IU/L '17 14 15  ' ALT 0 - 44 IU/L '19 19 21  ' Alk Phosphatase 39 - 117 IU/L 98 - -  Total Bilirubin 0.0 - 1.2 mg/dL 0.3  0.5 0.6   Lipid Panel     Component Value Date/Time   CHOL 183 03/01/2021 0900   CHOL 173 12/02/2019 0844   CHOL 191 07/22/2013 0835   TRIG 137 03/01/2021 0900   TRIG 253 (H) 07/22/2013 0835   HDL 46 03/01/2021  0900   HDL 41 12/02/2019 0844   HDL 38 (L) 07/22/2013 0835   CHOLHDL 4.0 03/01/2021 0900   VLDL 35 (H) 10/27/2016 0825   LDLCALC 112 (H) 03/01/2021 0900   LDLCALC 102 (H) 07/22/2013 5916    RADIOLOGY: No results found.  IMPRESSION:  No diagnosis found.   ASSESSMENT AND PLAN: Mr. Heacock is a 70 year-old gentleman who has a history of obesity, hyperlipidemia, hypertension, and GERD.  He had developed chest discomfort 2015 which ultimately led to definitive cardiac catheterization which revealed normal coronary arteries.  He  underwent successful surgery by Dr. Trenton Gammon involving C4-5 and C5-6 and tolerated this well from a cardiac standpoint.  He had developed some episodes of chest pain which most likely were of musculoskeletal etiology.  A CTA in 2020 showed minimal coronary plaque involving his LAD ostium and OM1 vessel.  He also was noted to have atherosclerosis of his thoracic aorta.  He is not having any anginal symptomatology.  He has had issues with edema.  His blood pressure today is relatively controlled but on repeat by me was 135/72.  He is on a regimen consisting of amlodipine 10 mg, benazepril 40 mg, carvedilol 6.25 mg twice a day, furosemide 40 mg daily and spironolactone 25 mg.  He has taken an extra furosemide 20 mg on a as needed basis.  He is on Zetia 10 mg.  He did not tolerate bempedoic acid for concomitant therapy.  Remotely he had developed CPK elevation on statins.  He is morbidly obese and since his evaluation 11 months ago he has gained over 30 pounds.  I have recommended he try to obtain a 5 pound weight loss per month if at all possible such that in 6 months he will be back to his previous weight of around 250.  He will see one of our APP's in 6 months and see  me in 1 year for follow-up evaluation.   Troy Sine, MD, [facility]  08/02/2021 8:15 AM

## 2021-08-02 NOTE — Patient Instructions (Signed)
Medication Instructions:  No changes  *If you need a refill on your cardiac medications before your next appointment, please call your pharmacy*   Lab Work:  Not needed   Testing/Procedures: Not needed   Follow-Up: At [facility], you and your health needs are our priority.  As part of our continuing mission to provide you with exceptional heart care, we have created designated Provider Care Teams.  These Care Teams include your primary Cardiologist (physician) and Advanced Practice Providers (APPs -  Physician Assistants and Nurse Practitioners) who all work together to provide you with the care you need, when you need it.     Your next appointment:   6 month(s)  The format for your next appointment:   In Person  Provider:   Coletta Memos, FNP    Then, Shelva Majestic, MD will plan to see you again in 12 month(s).   Other Instructions  Your physician encouraged you to lose weight for better health.  Goal is to lose 5 lbs a month

## 2021-08-06 ENCOUNTER — Other Ambulatory Visit: Payer: Self-pay | Admitting: Family Medicine

## 2021-08-06 ENCOUNTER — Encounter: Payer: PPO | Admitting: Nurse Practitioner

## 2021-08-06 ENCOUNTER — Other Ambulatory Visit: Payer: Self-pay | Admitting: Family

## 2021-08-06 NOTE — Telephone Encounter (Signed)
Pharmacy requested refill.   Pended Rx and sent to [facility] for approval due to HIGH ALERT Warning. (Dr. Miller out of office) 

## 2021-08-09 ENCOUNTER — Encounter: Payer: Self-pay | Admitting: Cardiovascular Disease

## 2021-08-19 ENCOUNTER — Encounter: Payer: PPO | Admitting: Nurse Practitioner

## 2021-08-24 ENCOUNTER — Encounter: Payer: Self-pay | Admitting: Nurse Practitioner

## 2021-08-24 ENCOUNTER — Ambulatory Visit (INDEPENDENT_AMBULATORY_CARE_PROVIDER_SITE_OTHER): Payer: PPO | Admitting: Nurse Practitioner

## 2021-08-24 ENCOUNTER — Other Ambulatory Visit: Payer: Self-pay

## 2021-08-24 ENCOUNTER — Telehealth: Payer: Self-pay

## 2021-08-24 DIAGNOSIS — Z0000 Encounter for general adult medical examination without abnormal findings: Secondary | ICD-10-CM | POA: Diagnosis not present

## 2021-08-24 NOTE — Patient Instructions (Signed)
Anthony [name] , Thank you for taking time to come for your Medicare Wellness Visit. I appreciate your ongoing commitment to your health goals. Please review the following plan we discussed and let me know if I can assist you in the future.   Screening recommendations/referrals: Colonoscopy up to date Recommended yearly ophthalmology/optometry visit for glaucoma screening and checkup Recommended yearly dental visit for hygiene and checkup  Vaccinations: Influenza vaccine up to date Pneumococcal vaccine up to date Tdap vaccine up to date Shingles vaccine RECOMMENDED - to get at local pharmacy    Advanced directives: on file  Conditions/risks identified: obesity, age  Next appointment: yearly   Preventive Care 73 Years and Older, Male Preventive care refers to lifestyle choices and visits with your health care provider that can promote health and wellness. What does preventive care include? A yearly physical exam. This is also called an annual well check. Dental exams once or twice a year. Routine eye exams. Ask your health care provider how often you should have your eyes checked. Personal lifestyle choices, including: Daily care of your teeth and gums. Regular physical activity. Eating a healthy diet. Avoiding tobacco and drug use. Limiting alcohol use. Practicing safe sex. Taking low doses of aspirin every day. Taking vitamin and mineral supplements as recommended by your health care provider. What happens during an annual well check? The services and screenings done by your health care provider during your annual well check will depend on your age, overall health, lifestyle risk factors, and family history of disease. Counseling  Your health care provider may ask you questions about your: Alcohol use. Tobacco use. Drug use. Emotional well-being. Home and relationship well-being. Sexual activity. Eating habits. History of falls. Memory and ability to understand  (cognition). Work and work Statistician. Screening  You may have the following tests or measurements: Height, weight, and BMI. Blood pressure. Lipid and cholesterol levels. These may be checked every 5 years, or more frequently if you are over 80 years old. Skin check. Lung cancer screening. You may have this screening every year starting at age 73 if you have a 30-pack-year history of smoking and currently smoke or have quit within the past 15 years. Fecal occult blood test (FOBT) of the stool. You may have this test every year starting at age 73. Flexible sigmoidoscopy or colonoscopy. You may have a sigmoidoscopy every 5 years or a colonoscopy every 10 years starting at age 73. Prostate cancer screening. Recommendations will vary depending on your family history and other risks. Hepatitis C blood test. Hepatitis B blood test. Sexually transmitted disease (STD) testing. Diabetes screening. This is done by checking your blood sugar (glucose) after you have not eaten for a while (fasting). You may have this done every 1-3 years. Abdominal aortic aneurysm (AAA) screening. You may need this if you are a current or former smoker. Osteoporosis. You may be screened starting at age 73 if you are at high risk. Talk with your health care provider about your test results, treatment options, and if necessary, the need for more tests. Vaccines  Your health care provider may recommend certain vaccines, such as: Influenza vaccine. This is recommended every year. Tetanus, diphtheria, and acellular pertussis (Tdap, Td) vaccine. You may need a Td booster every 10 years. Zoster vaccine. You may need this after age 73. Pneumococcal 13-valent conjugate (PCV13) vaccine. One dose is recommended after age 73. Pneumococcal polysaccharide (PPSV23) vaccine. One dose is recommended after age 73. Talk to your health care provider about  which screenings and vaccines you need and how often you need them. This  information is not intended to replace advice given to you by your health care provider. Make sure you discuss any questions you have with your health care provider. Document Released: 09/04/2015 Document Revised: 04/27/2016 Document Reviewed: 06/09/2015 Elsevier Interactive Patient Education  2017 [place] Prevention in the Home Falls can cause injuries. They can happen to people of all ages. There are many things you can do to make your home safe and to help prevent falls. What can I do on the outside of my home? Regularly fix the edges of walkways and driveways and fix any cracks. Remove anything that might make you trip as you walk through a door, such as a raised step or threshold. Trim any bushes or trees on the path to your home. Use bright outdoor lighting. Clear any walking paths of anything that might make someone trip, such as rocks or tools. Regularly check to see if handrails are loose or broken. Make sure that both sides of any steps have handrails. Any raised decks and porches should have guardrails on the edges. Have any leaves, snow, or ice cleared regularly. Use sand or salt on walking paths during winter. Clean up any spills in your garage right away. This includes oil or grease spills. What can I do in the bathroom? Use night lights. Install grab bars by the toilet and in the tub and shower. Do not use towel bars as grab bars. Use non-skid mats or decals in the tub or shower. If you need to sit down in the shower, use a plastic, non-slip stool. Keep the floor dry. Clean up any water that spills on the floor as soon as it happens. Remove soap buildup in the tub or shower regularly. Attach bath mats securely with double-sided non-slip rug tape. Do not have throw rugs and other things on the floor that can make you trip. What can I do in the bedroom? Use night lights. Make sure that you have a light by your bed that is easy to reach. Do not use any sheets or  blankets that are too big for your bed. They should not hang down onto the floor. Have a firm chair that has side arms. You can use this for support while you get dressed. Do not have throw rugs and other things on the floor that can make you trip. What can I do in the kitchen? Clean up any spills right away. Avoid walking on wet floors. Keep items that you use a lot in easy-to-reach places. If you need to reach something above you, use a strong step stool that has a grab bar. Keep electrical cords out of the way. Do not use floor polish or wax that makes floors slippery. If you must use wax, use non-skid floor wax. Do not have throw rugs and other things on the floor that can make you trip. What can I do with my stairs? Do not leave any items on the stairs. Make sure that there are handrails on both sides of the stairs and use them. Fix handrails that are broken or loose. Make sure that handrails are as long as the stairways. Check any carpeting to make sure that it is firmly attached to the stairs. Fix any carpet that is loose or worn. Avoid having throw rugs at the top or bottom of the stairs. If you do have throw rugs, attach them to the floor with  carpet tape. Make sure that you have a light switch at the top of the stairs and the bottom of the stairs. If you do not have them, ask someone to add them for you. What else can I do to help prevent falls? Wear shoes that: Do not have high heels. Have rubber bottoms. Are comfortable and fit you well. Are closed at the toe. Do not wear sandals. If you use a stepladder: Make sure that it is fully opened. Do not climb a closed stepladder. Make sure that both sides of the stepladder are locked into place. Ask someone to hold it for you, if possible. Clearly mark and make sure that you can see: Any grab bars or handrails. First and last steps. Where the edge of each step is. Use tools that help you move around (mobility aids) if they are  needed. These include: Canes. Walkers. Scooters. Crutches. Turn on the lights when you go into a dark area. Replace any light bulbs as soon as they burn out. Set up your furniture so you have a clear path. Avoid moving your furniture around. If any of your floors are uneven, fix them. If there are any pets around you, be aware of where they are. Review your medicines with your doctor. Some medicines can make you feel dizzy. This can increase your chance of falling. Ask your doctor what other things that you can do to help prevent falls. This information is not intended to replace advice given to you by your health care provider. Make sure you discuss any questions you have with your health care provider. Document Released: 06/04/2009 Document Revised: 01/14/2016 Document Reviewed: 09/12/2014 Elsevier Interactive Patient Education  2017 [employer].

## 2021-08-24 NOTE — Progress Notes (Signed)
This service is provided via telemedicine  No vital signs collected/recorded due to the encounter was a telemedicine visit.   Location of patient (ex: home, work):  Home  Patient consents to a telephone visit:  Yes, see encounter dated 08/24/2021  Location of the provider (ex: office, home):  [place]  Name of any referring provider:  Wardell Honour, MD  Names of all persons participating in the telemedicine service and their role in the encounter:  Sherrie Mustache, Nurse Practitioner, Carroll Kinds, CMA, and patient.   Time spent on call:  8 minutes with medical assistant

## 2021-08-24 NOTE — Telephone Encounter (Signed)
Mr. Anthony [name], Anthony [name] are scheduled for a virtual visit with your provider today.    Just as we do with appointments in the office, we must obtain your consent to participate.  Your consent will be active for this visit and any virtual visit you may have with one of our providers in the next 365 days.    If you have a MyChart account, I can also send a copy of this consent to you electronically.  All virtual visits are billed to your insurance company just like a traditional visit in the office.  As this is a virtual visit, video technology does not allow for your provider to perform a traditional examination.  This may limit your provider's ability to fully assess your condition.  If your provider identifies any concerns that need to be evaluated in person or the need to arrange testing such as labs, EKG, etc, we will make arrangements to do so.    Although advances in technology are sophisticated, we cannot ensure that it will always work on either your end or our end.  If the connection with a video visit is poor, we may have to switch to a telephone visit.  With either a video or telephone visit, we are not always able to ensure that we have a secure connection.   I need to obtain your verbal consent now.   Are you willing to proceed with your visit today?   Anthony Bern. has provided verbal consent on 08/24/2021 for a virtual visit (video or telephone).   Carroll Kinds, [facility] 08/24/2021  8:31 AM

## 2021-08-24 NOTE — Progress Notes (Signed)
Subjective:   Anthony Nicolls. is a 71 y.o. male who presents for Medicare Annual/Subsequent preventive examination.  Review of Systems     Cardiac Risk Factors include: male gender;hypertension;obesity (BMI >30kg/m2);advanced age (>71men, >79 women);dyslipidemia     Objective:    There were no vitals filed for this visit. There is no height or weight on file to calculate BMI.  Advanced Directives 08/24/2021 08/13/2020 08/04/2020 04/02/2020 11/28/2019 07/31/2019 07/29/2019  Does Patient Have a Medical Advance Directive? Yes Yes Yes Yes Yes Yes Yes  Type of Advance Directive Living will;Out of facility DNR (pink MOST or yellow form) Out of facility DNR (pink MOST or yellow form) Living will Out of facility DNR (pink MOST or yellow form) - Living will Living will  Does patient want to make changes to medical advance directive? - No - Patient declined No - Patient declined No - Guardian declined No - Patient declined No - Patient declined No - Patient declined  Copy of Healthcare Power of Attorney in Chart? - - - - - - -  Would patient like information on creating a medical advance directive? - - - - - - -  Pre-existing out of facility DNR order (yellow form or pink MOST form) Pink MOST form placed in chart (order not valid for inpatient use) - - Pink MOST/Yellow Form most recent copy in chart - Physician notified to receive inpatient order - - -    Current Medications (verified) Outpatient Encounter Medications as of 08/24/2021  Medication Sig   amLODipine (NORVASC) 10 MG tablet TAKE 1 TABLET BY MOUTH EVERY DAY IN THE MORNING   aspirin EC 81 MG tablet Take 81 mg by mouth every morning.   benazepril (LOTENSIN) 40 MG tablet TAKE 1 TABLET BY MOUTH EVERY DAY   brimonidine (ALPHAGAN P) 0.1 % SOLN    carvedilol (COREG) 6.25 MG tablet Take 1 tablet (6.25 mg total) by mouth 2 (two) times daily.   dorzolamide-timolol (COSOPT) 22.3-6.8 MG/ML ophthalmic solution Place 2 drops into the right eye 2 (two)  times daily.    esomeprazole (NEXIUM) 40 MG capsule TAKE 1 CAPSULE BY MOUTH AT BEDTIME. FOR REFLUX (Patient taking differently: in the morning and at bedtime. TAKE 1 CAPSULE BY MOUTH AT BEDTIME. FOR REFLUX)   ezetimibe (ZETIA) 10 MG tablet TAKE 1 TABLET BY MOUTH EVERY DAY   finasteride (PROSCAR) 5 MG tablet TAKE 1 TABLET BY MOUTH EVERY DAY IN THE MORNING   furosemide (LASIX) 40 MG tablet Take 1 tablet (40 mg total) by mouth daily. Take an additional 20 mg (0.5 tablet) for weight again. (3 pounds overnight or 5 pounds in 1 week.)   Omega-3 Fatty Acids (FISH OIL PO) Take 1,400 mg by mouth.   Polyvinyl Alcohol-Povidone (REFRESH OP) Place 1 drop into the right eye daily as needed (dry eyes/irritation).   spironolactone (ALDACTONE) 25 MG tablet TAKE 1 TABLET (25 MG TOTAL) BY MOUTH DAILY.   No facility-administered encounter medications on file as of 08/24/2021.    Allergies (verified) Lipitor [atorvastatin], Red yeast rice [cholestin], Statins, Statins support [acid blockers support], Codeine, and Latex   History: Past Medical History:  Diagnosis Date   Benign neoplasm of colon    Benign neoplasm of skin of upper limb, including shoulder    Carpal tunnel syndrome    Chest pain    a. normal cors by cath in 2015   Diaphragmatic hernia without mention of obstruction or gangrene    Dyslipidemia    Elevated  prostate specific antigen (PSA)    GERD (gastroesophageal reflux disease)    Herpes zoster without mention of complication    History of hiatal hernia    Hypertension    Hypertrophy of prostate with urinary obstruction and other lower urinary tract symptoms (LUTS)    Impotence of organic origin    Lumbago    Obesity, unspecified    Osteoarthrosis, unspecified whether generalized or localized, lower leg    Other and unspecified hyperlipidemia    Other malaise and fatigue    Other seborrheic keratosis    Special screening for malignant neoplasm of prostate    Stenosis, cervical spine     Unspecified tinnitus    Past Surgical History:  Procedure Laterality Date   ANTERIOR CERVICAL DECOMP/DISCECTOMY FUSION N/A 02/19/2018   Procedure: ANTERIOR CERVICAL DECOMPRESSION/DISCECTOMY FUSION - CERVICAL FOUR-CERVICAL FIVE - CERVICAL FIVE-CERVICAL SIX;  Surgeon: Earnie Larsson, MD;  Location: [place];  Service: Neurosurgery;  Laterality: N/A;   CARDIOVASCULAR STRESS TEST  06/15/2012   Non-diagnostic for ischemia. No lexiscan EKG changes.   CHOLECYSTECTOMY  1992   EYE SURGERY     eyeband  2006   removal of right eyeband   INGUINAL HERNIA REPAIR  1984   left   INTRAOCULAR LENS REMOVAL  2008   right   LEFT HEART CATHETERIZATION WITH CORONARY ANGIOGRAM N/A 09/05/2013   Procedure: LEFT HEART CATHETERIZATION WITH CORONARY ANGIOGRAM;  Surgeon: Troy Sine, MD;  Location: [facility] CATH LAB;  Service: Cardiovascular;  Laterality: N/A;   LOWER VENOUS EXTREMITY DOPPLER  01/26/2011   No evidence of thrombus or thrombophlebitis.   RETINAL DETACHMENT SURGERY  2001   right   TOTAL HIP ARTHROPLASTY  2012   bilateral   TOTAL KNEE ARTHROPLASTY  2011, 2012   bilateral   TRANSTHORACIC ECHOCARDIOGRAM  10/05/2009   EF >55%, normal LV size, systolic, and diastolic function.   Family History  Problem Relation Age of Onset   Diabetes Mother    Arrhythmia Mother    Hypertension Mother    Dementia Father    Pulmonary embolism Father    Hypertension Father    Cancer Sister        Liver cancer - Histocytic lymphoma   Cancer Sister 34       Skin cancer   Hypertension Sister 76   Diabetes Sister    Stroke Brother    Diabetes Brother    Hypertension Brother    Cancer Daughter    Cancer Maternal Grandmother        Skin cancer   Heart attack Maternal Grandfather    Cancer Paternal Grandfather    Hypertension Paternal Grandfather    Social History   Socioeconomic History   Marital status: Married    Spouse name: Not on file   Number of children: Not on file   Years of education: Not on file   Highest  education level: Not on file  Occupational History   Occupation: retired Medical sales representative     Comment: Hospital doctor  Tobacco Use   Smoking status: Never   Smokeless tobacco: Never  Scientific laboratory technician Use: Never used  Substance and Sexual Activity   Alcohol use: No   Drug use: No   Sexual activity: Yes    Partners: Female    Comment: wife  Other Topics Concern   Not on file  Social History Narrative   Married   Never smoked   Alcohol none   Exercise -gym 3 days  a week (join 3 months ago)   Living Will   Social Determinants of Health   Financial Resource Strain: Not on file  Food Insecurity: Not on file  Transportation Needs: Not on file  Physical Activity: Not on file  Stress: Not on file  Social Connections: Not on file    Tobacco Counseling Counseling given: Not Answered   Clinical Intake:  Pre-visit preparation completed: Yes  Pain : No/denies pain     BMI - recorded: 40 Nutritional Risks: None  How often do you need to have someone help you when you read instructions, pamphlets, or other written materials from your doctor or pharmacy?: 1 - Never  [place]         Activities of Daily Living In your present state of health, do you have any difficulty performing the following activities: 08/24/2021  Hearing? N  Vision? Y  Difficulty concentrating or making decisions? N  Walking or climbing stairs? Y  Comment trouble walking down stairs due to hips  Dressing or bathing? N  Doing errands, shopping? N  Preparing Food and eating ? N  Using the Toilet? N  In the past six months, have you accidently leaked urine? N  Do you have problems with loss of bowel control? N  Managing your Medications? N  Managing your Finances? N  Housekeeping or managing your Housekeeping? N  Some recent data might be hidden    Patient Care Team: Wardell Honour, MD as PCP - General (Family Medicine) Troy Sine, MD as PCP - Cardiology (Cardiology)  Indicate  any recent Medical Services you may have received from other than [facility] providers in the past year (date may be approximate).     Assessment:   This is a routine wellness examination for Anthony [name].  Hearing/Vision screen Hearing Screening - Comments:: Patient has no hearing problems. Vision Screening - Comments:: Patient has glaucoma in right eye. Patient had last eye exam about a month ago. Patient can't remember doctor's name.  Dietary issues and exercise activities discussed: Current Exercise Habits: The patient does not participate in regular exercise at present, Exercise limited by: orthopedic condition(s)   Goals Addressed   None    Depression Screen PHQ 2/9 Scores 08/24/2021 08/13/2020 08/04/2020 04/02/2020 11/28/2019 07/31/2019 07/29/2019  PHQ - 2 Score 0 0 0 0 0 0 0    Fall Risk Fall Risk  08/24/2021 03/03/2021 08/13/2020 08/04/2020 04/02/2020  Falls in the past year? 0 0 1 0 0  Number falls in past yr: 0 0 0 0 0  Injury with Fall? 0 0 0 0 0  Risk for fall due to : No Fall Risks No Fall Risks - - -  Follow up Falls evaluation completed Falls evaluation completed;Education provided;Falls prevention discussed - - -    FALL RISK PREVENTION PERTAINING TO THE HOME:  Any stairs in or around the home? Yes  If so, are there any without handrails? No  Home free of loose throw rugs in walkways, pet beds, electrical cords, etc? Yes  Adequate lighting in your home to reduce risk of falls? Yes   ASSISTIVE DEVICES UTILIZED TO PREVENT FALLS:  Life alert? No  Use of a cane, walker or w/c? No  Grab bars in the bathroom? No  Shower chair or bench in shower? Yes  Elevated toilet seat or a handicapped toilet? Yes   TIMED UP AND GO:  Was the test performed? No .    Cognitive Function: MMSE - Mini Mental State Exam  07/29/2019 10/31/2017 08/05/2016  Orientation to time 3 4 4   Orientation to Place 5 5 5   Registration 3 3 3   Attention/ Calculation 4 5 3   Recall 3 1 3   Language- name 2 objects  2 2 2   Language- repeat 1 1 1   Language- follow 3 step command 3 3 3   Language- read & follow direction 1 1 1   Write a sentence 1 1 1   Copy design 1 1 1   Total score 27 27 27      6CIT Screen 08/24/2021 08/04/2020  What Year? 0 points 0 points  What month? 0 points 0 points  What time? 0 points 0 points  Count back from 20 0 points 0 points  Months in reverse 0 points 0 points  Repeat phrase 0 points 0 points  Total Score 0 0    Immunizations Immunization History  Administered Date(s) Administered   Fluad Quad(high Dose 65+) 04/25/2019   Influenza, High Dose Seasonal PF 05/04/2017, 06/21/2018, 06/15/2021   Influenza,inj,Quad PF,6+ Mos 06/11/2013, 05/20/2015, 08/03/2016   Influenza-Unspecified 07/05/2018, 06/07/2020   PFIZER(Purple Top)SARS-COV-2 Vaccination 09/10/2019, 09/28/2019, 06/07/2020, 06/15/2021   Pneumococcal Conjugate-13 09/23/2014   Pneumococcal Polysaccharide-23 06/27/2003, 10/31/2017   Tdap 08/30/2011    TDAP status: Up to date  Flu Vaccine status: Up to date  Pneumococcal vaccine status: Up to date  Covid-19 vaccine status: Information provided on how to obtain vaccines.   Qualifies for Shingles Vaccine? Yes   Zostavax completed No   Shingrix Completed?: No.    Education has been provided regarding the importance of this vaccine. Patient has been advised to call insurance company to determine out of pocket expense if they have not yet received this vaccine. Advised may also receive vaccine at local pharmacy or Health Dept. Verbalized acceptance and understanding.  Screening Tests Health Maintenance  Topic Date Due   Zoster Vaccines- Shingrix (1 of 2) Never done   COVID-19 Vaccine (5 - Booster for Pfizer series) 08/10/2021   TETANUS/TDAP  08/29/2021   COLONOSCOPY (Pts 45-22yrs Insurance coverage will need to be confirmed)  03/09/2031   Pneumonia Vaccine 98+ Years old  Completed   INFLUENZA VACCINE  Completed   HPV VACCINES  Aged Out   Hepatitis C  Screening  Discontinued    Health Maintenance  Health Maintenance Due  Topic Date Due   Zoster Vaccines- Shingrix (1 of 2) Never done   COVID-19 Vaccine (5 - Booster for Pfizer series) 08/10/2021    Colorectal cancer screening: Type of screening: Colonoscopy. Completed 03/08/21. Repeat every 10 years  Lung Cancer Screening: (Low Dose CT Chest recommended if Age 54-80 years, 30 pack-year currently smoking OR have quit w/in 15years.) does not qualify.   Lung Cancer Screening Referral: na  Additional Screening:  Hepatitis C Screening: does qualify; Completed 2021  Vision Screening: Recommended annual ophthalmology exams for early detection of glaucoma and other disorders of the eye. Is the patient up to date with their annual eye exam?  Yes  Who is the provider or what is the name of the office in which the patient attends annual eye exams? Unsure of practice  If pt is not established with a provider, would they like to be referred to a provider to establish care? No .   Dental Screening: Recommended annual dental exams for proper oral hygiene  Community Resource Referral / Chronic Care Management: CRR required this visit?  No   CCM required this visit?  No      Plan:  I have personally reviewed and noted the following in the patients chart:   Medical and social history Use of alcohol, tobacco or illicit drugs  Current medications and supplements including opioid prescriptions. Patient is not currently taking opioid prescriptions. Functional ability and status Nutritional status Physical activity Advanced directives List of other physicians Hospitalizations, surgeries, and ER visits in previous 12 months Vitals Screenings to include cognitive, depression, and falls Referrals and appointments  In addition, I have reviewed and discussed with patient certain preventive protocols, quality metrics, and best practice recommendations. A written personalized care plan for  preventive services as well as general preventive health recommendations were provided to patient.     Lauree Chandler, NP   08/24/2021    Virtual Visit via Telephone Note  I connected withNAME@ on 08/24/21 at  8:00 AM EST by telephone and verified that I am speaking with the correct person using two identifiers.  Location: Patient: home Provider: [facility]    I discussed the limitations, risks, security and privacy concerns of performing an evaluation and management service by telephone and the availability of in person appointments. I also discussed with the patient that there may be a patient responsible charge related to this service. The patient expressed understanding and agreed to proceed.   I discussed the assessment and treatment plan with the patient. The patient was provided an opportunity to ask questions and all were answered. The patient agreed with the plan and demonstrated an understanding of the instructions.   The patient was advised to call back or seek an in-person evaluation if the symptoms worsen or if the condition fails to improve as anticipated.  I provided 15 minutes of non-face-to-face time during this encounter.  Anthony [name]. Anthony [name] Avs printed and mailed

## 2021-08-24 NOTE — Progress Notes (Deleted)
Subjective:   Anthony Lata. is a 71 y.o. male who presents for Medicare Annual/Subsequent preventive examination.  Review of Systems    ***       Objective:    There were no vitals filed for this visit. There is no height or weight on file to calculate BMI.  Advanced Directives 08/24/2021 08/13/2020 08/04/2020 04/02/2020 11/28/2019 07/31/2019 07/29/2019  Does Patient Have a Medical Advance Directive? Yes Yes Yes Yes Yes Yes Yes  Type of Advance Directive Living will;Out of facility DNR (pink MOST or yellow form) Out of facility DNR (pink MOST or yellow form) Living will Out of facility DNR (pink MOST or yellow form) - Living will Living will  Does patient want to make changes to medical advance directive? - No - Patient declined No - Patient declined No - Guardian declined No - Patient declined No - Patient declined No - Patient declined  Copy of Healthcare Power of Attorney in Chart? - - - - - - -  Would patient like information on creating a medical advance directive? - - - - - - -  Pre-existing out of facility DNR order (yellow form or pink MOST form) Pink MOST form placed in chart (order not valid for inpatient use) - - Pink MOST/Yellow Form most recent copy in chart - Physician notified to receive inpatient order - - -    Current Medications (verified) Outpatient Encounter Medications as of 08/24/2021  Medication Sig   amLODipine (NORVASC) 10 MG tablet TAKE 1 TABLET BY MOUTH EVERY DAY IN THE MORNING   aspirin EC 81 MG tablet Take 81 mg by mouth every morning.   benazepril (LOTENSIN) 40 MG tablet TAKE 1 TABLET BY MOUTH EVERY DAY   brimonidine (ALPHAGAN P) 0.1 % SOLN    carvedilol (COREG) 6.25 MG tablet Take 1 tablet (6.25 mg total) by mouth 2 (two) times daily.   dorzolamide-timolol (COSOPT) 22.3-6.8 MG/ML ophthalmic solution Place 2 drops into the right eye 2 (two) times daily.    esomeprazole (NEXIUM) 40 MG capsule TAKE 1 CAPSULE BY MOUTH AT BEDTIME. FOR REFLUX (Patient  taking differently: in the morning and at bedtime. TAKE 1 CAPSULE BY MOUTH AT BEDTIME. FOR REFLUX)   ezetimibe (ZETIA) 10 MG tablet TAKE 1 TABLET BY MOUTH EVERY DAY   finasteride (PROSCAR) 5 MG tablet TAKE 1 TABLET BY MOUTH EVERY DAY IN THE MORNING   furosemide (LASIX) 40 MG tablet Take 1 tablet (40 mg total) by mouth daily. Take an additional 20 mg (0.5 tablet) for weight again. (3 pounds overnight or 5 pounds in 1 week.)   Omega-3 Fatty Acids (FISH OIL PO) Take 1,400 mg by mouth.   Polyvinyl Alcohol-Povidone (REFRESH OP) Place 1 drop into the right eye daily as needed (dry eyes/irritation).   spironolactone (ALDACTONE) 25 MG tablet TAKE 1 TABLET (25 MG TOTAL) BY MOUTH DAILY.   No facility-administered encounter medications on file as of 08/24/2021.    Allergies (verified) Lipitor [atorvastatin], Red yeast rice [cholestin], Statins, Statins support [acid blockers support], Codeine, and Latex   History: Past Medical History:  Diagnosis Date   Benign neoplasm of colon    Benign neoplasm of skin of upper limb, including shoulder    Carpal tunnel syndrome    Chest pain    a. normal cors by cath in 2015   Diaphragmatic hernia without mention of obstruction or gangrene    Dyslipidemia    Elevated prostate specific antigen (PSA)    GERD (gastroesophageal reflux  disease)    Herpes zoster without mention of complication    History of hiatal hernia    Hypertension    Hypertrophy of prostate with urinary obstruction and other lower urinary tract symptoms (LUTS)    Impotence of organic origin    Lumbago    Obesity, unspecified    Osteoarthrosis, unspecified whether generalized or localized, lower leg    Other and unspecified hyperlipidemia    Other malaise and fatigue    Other seborrheic keratosis    Special screening for malignant neoplasm of prostate    Stenosis, cervical spine    Unspecified tinnitus    Past Surgical History:  Procedure Laterality Date    ANTERIOR CERVICAL DECOMP/DISCECTOMY FUSION N/A 02/19/2018   Procedure: ANTERIOR CERVICAL DECOMPRESSION/DISCECTOMY FUSION - CERVICAL FOUR-CERVICAL FIVE - CERVICAL FIVE-CERVICAL SIX;  Surgeon: Earnie Larsson, MD;  Location: [facility];  Service: Neurosurgery;  Laterality: N/A;   CARDIOVASCULAR STRESS TEST  06/15/2012   Non-diagnostic for ischemia. No lexiscan EKG changes.   CHOLECYSTECTOMY  1992   EYE SURGERY     eyeband  2006   removal of right eyeband   INGUINAL HERNIA REPAIR  1984   left   INTRAOCULAR LENS REMOVAL  2008   right   LEFT HEART CATHETERIZATION WITH CORONARY ANGIOGRAM N/A 09/05/2013   Procedure: LEFT HEART CATHETERIZATION WITH CORONARY ANGIOGRAM;  Surgeon: Troy Sine, MD;  Location: [facility] CATH LAB;  Service: Cardiovascular;  Laterality: N/A;   LOWER VENOUS EXTREMITY DOPPLER  01/26/2011   No evidence of thrombus or thrombophlebitis.   RETINAL DETACHMENT SURGERY  2001   right   TOTAL HIP ARTHROPLASTY  2012   bilateral   TOTAL KNEE ARTHROPLASTY  2011, 2012   bilateral   TRANSTHORACIC ECHOCARDIOGRAM  10/05/2009   EF >55%, normal LV size, systolic, and diastolic function.   Family History  Problem Relation Age of Onset   Diabetes Mother    Arrhythmia Mother    Hypertension Mother    Dementia Father    Pulmonary embolism Father    Hypertension Father    Cancer Sister        Liver cancer - Histocytic lymphoma   Cancer Sister 33       Skin cancer   Hypertension Sister 53   Diabetes Sister    Stroke Brother    Diabetes Brother    Hypertension Brother    Cancer Daughter    Cancer Maternal Grandmother        Skin cancer   Heart attack Maternal Grandfather    Cancer Paternal Grandfather    Hypertension Paternal Grandfather    Social History   Socioeconomic History   Marital status: Married    Spouse name: Not on file   Number of children: Not on file   Years of education: Not on file   Highest education level: Not on file  Occupational  History   Occupation: retired Medical sales representative     Comment: Hospital doctor  Tobacco Use   Smoking status: Never   Smokeless tobacco: Never  Scientific laboratory technician Use: Never used  Substance and Sexual Activity   Alcohol use: No   Drug use: No   Sexual activity: Yes    Partners: Female    Comment: wife  Other Topics Concern   Not on file  Social History Narrative   Married   Never smoked   Alcohol none   Exercise -gym 3 days a week (join 3 months ago)   Living Will  Social Determinants of Health   Financial Resource Strain: Not on file  Food Insecurity: Not on file  Transportation Needs: Not on file  Physical Activity: Not on file  Stress: Not on file  Social Connections: Not on file    Tobacco Counseling Counseling given: Not Answered   Clinical Intake:                 Diabetic?***         Activities of Daily Living No flowsheet data found.  Patient Care Team: Wardell Honour, MD as PCP - General (Family Medicine) Troy Sine, MD as PCP - Cardiology (Cardiology)  Indicate any recent Medical Services you may have received from other than [facility] providers in the past year (date may be approximate).     Assessment:   This is a routine wellness examination for Anthony [name].  Hearing/Vision screen Hearing Screening - Comments:: Patient has no hearing problems. Vision Screening - Comments:: Patient has glaucoma in right eye. Patient had last eye exam about a month ago. Patient can't remember doctor's name.  Dietary issues and exercise activities discussed:     Goals Addressed   None   Depression Screen PHQ 2/9 Scores 08/24/2021 08/13/2020 08/04/2020 04/02/2020 11/28/2019 07/31/2019 07/29/2019  PHQ - 2 Score 0 0 0 0 0 0 0    Fall Risk Fall Risk  08/24/2021 03/03/2021 08/13/2020 08/04/2020 04/02/2020  Falls in the past year? 0 0 1 0 0  Number falls in past yr: 0 0 0 0 0  Injury with Fall? 0 0 0 0 0  Risk for fall due to : No Fall Risks No Fall  Risks - - -  Follow up Falls evaluation completed Falls evaluation completed;Education provided;Falls prevention discussed - - -    FALL RISK PREVENTION PERTAINING TO THE HOME:  Any stairs in or around the home? {YES/NO:21197} If so, are there any without handrails? {YES/NO:21197} Home free of loose throw rugs in walkways, pet beds, electrical cords, etc? {YES/NO:21197} Adequate lighting in your home to reduce risk of falls? {YES/NO:21197}  ASSISTIVE DEVICES UTILIZED TO PREVENT FALLS:  Life alert? {YES/NO:21197} Use of a cane, walker or w/c? {YES/NO:21197} Grab bars in the bathroom? {YES/NO:21197} Shower chair or bench in shower? {YES/NO:21197} Elevated toilet seat or a handicapped toilet? {YES/NO:21197}  TIMED UP AND GO:  Was the test performed? {YES/NO:21197}.  Length of time to ambulate 10 feet: *** sec.   {Appearance of Gait:2101803}  Cognitive Function: MMSE - Mini Mental State Exam 07/29/2019 10/31/2017 08/05/2016  Orientation to time 3 4 4   Orientation to Place 5 5 5   Registration 3 3 3   Attention/ Calculation 4 5 3   Recall 3 1 3   Language- name 2 objects 2 2 2   Language- repeat 1 1 1   Language- follow 3 step command 3 3 3   Language- read & follow direction 1 1 1   Write a sentence 1 1 1   Copy design 1 1 1   Total score 27 27 27      6CIT Screen 08/24/2021 08/04/2020  What Year? 0 points 0 points  What month? 0 points 0 points  What time? 0 points 0 points  Count back from 20 0 points 0 points  Months in reverse 0 points 0 points  Repeat phrase 0 points 0 points  Total Score 0 0    Immunizations Immunization History  Administered Date(s) Administered   Fluad Quad(high Dose 65+) 04/25/2019   Influenza, High Dose Seasonal PF 05/04/2017, 06/21/2018, 06/15/2021  Influenza,inj,Quad PF,6+ Mos 06/11/2013, 05/20/2015, 08/03/2016   Influenza-Unspecified 07/05/2018, 06/07/2020   PFIZER(Purple Top)SARS-COV-2 Vaccination 09/10/2019, 09/28/2019, 06/07/2020,  06/15/2021   Pneumococcal Conjugate-13 09/23/2014   Pneumococcal Polysaccharide-23 06/27/2003, 10/31/2017   Tdap 08/30/2011    {TDAP status:2101805}  {Flu Vaccine status:2101806}  {Pneumococcal vaccine status:2101807}  {Covid-19 vaccine status:2101808}  Qualifies for Shingles Vaccine? {YES/NO:21197}  Zostavax completed {YES/NO:21197}  {Shingrix Completed?:2101804}  Screening Tests Health Maintenance  Topic Date Due   Zoster Vaccines- Shingrix (1 of 2) Never done   COVID-19 Vaccine (5 - Booster for Pfizer series) 08/10/2021   TETANUS/TDAP  08/29/2021   COLONOSCOPY (Pts 45-13yrs Insurance coverage will need to be confirmed)  03/09/2031   Pneumonia Vaccine 74+ Years old  Completed   INFLUENZA VACCINE  Completed   HPV VACCINES  Aged Out   Hepatitis C Screening  Discontinued    Health Maintenance  Health Maintenance Due  Topic Date Due   Zoster Vaccines- Shingrix (1 of 2) Never done   COVID-19 Vaccine (5 - Booster for Pfizer series) 08/10/2021    {Colorectal cancer screening:2101809}  Lung Cancer Screening: (Low Dose CT Chest recommended if Age 41-80 years, 30 pack-year currently smoking OR have quit w/in 15years.) {DOES NOT does:27190::"does not"} qualify.   Lung Cancer Screening Referral: ***  Additional Screening:  Hepatitis C Screening: {DOES NOT does:27190::"does not"} qualify; Completed ***  Vision Screening: Recommended annual ophthalmology exams for early detection of glaucoma and other disorders of the eye. Is the patient up to date with their annual eye exam?  {YES/NO:21197} Who is the provider or what is the name of the office in which the patient attends annual eye exams? *** If pt is not established with a provider, would they like to be referred to a provider to establish care? {YES/NO:21197}.   Dental Screening: Recommended annual dental exams for proper oral hygiene  Community Resource Referral / Chronic Care Management: CRR required  this visit?  {YES/NO:21197}  CCM required this visit?  {YES/NO:21197}     Plan:     I have personally reviewed and noted the following in the patients chart:   Medical and social history Use of alcohol, tobacco or illicit drugs  Current medications and supplements including opioid prescriptions. {Opioid [phone]} Functional ability and status Nutritional status Physical activity Advanced directives List of other physicians Hospitalizations, surgeries, and ER visits in previous 12 months Vitals Screenings to include cognitive, depression, and falls Referrals and appointments  In addition, I have reviewed and discussed with patient certain preventive protocols, quality metrics, and best practice recommendations. A written personalized care plan for preventive services as well as general preventive health recommendations were provided to patient.     Lauree Chandler, NP   08/24/2021   Nurse Notes: ***

## 2021-09-01 ENCOUNTER — Ambulatory Visit (INDEPENDENT_AMBULATORY_CARE_PROVIDER_SITE_OTHER): Payer: PPO | Admitting: Family Medicine

## 2021-09-01 ENCOUNTER — Encounter: Payer: Self-pay | Admitting: Family Medicine

## 2021-09-01 ENCOUNTER — Other Ambulatory Visit: Payer: Self-pay

## 2021-09-01 VITALS — BP 130/88 | HR 69 | Temp 97.1°F | Ht 70.0 in | Wt 279.2 lb

## 2021-09-01 DIAGNOSIS — Z23 Encounter for immunization: Secondary | ICD-10-CM

## 2021-09-01 DIAGNOSIS — N401 Enlarged prostate with lower urinary tract symptoms: Secondary | ICD-10-CM

## 2021-09-01 DIAGNOSIS — I10 Essential (primary) hypertension: Secondary | ICD-10-CM

## 2021-09-01 DIAGNOSIS — E7849 Other hyperlipidemia: Secondary | ICD-10-CM | POA: Diagnosis not present

## 2021-09-01 DIAGNOSIS — R350 Frequency of micturition: Secondary | ICD-10-CM

## 2021-09-01 NOTE — Progress Notes (Signed)
Provider:  Alain Honey, MD  Careteam: Patient Care Team: Wardell Honour, MD as PCP - General (Family Medicine) Troy Sine, MD as PCP - Cardiology (Cardiology)  PLACE OF SERVICE:  [facility]  Advanced Directive information    Allergies  Allergen Reactions   Lipitor [Atorvastatin] Other (See Comments)    Leg pain/cramps; statin myopathy   Red Yeast Rice [Cholestin] Other (See Comments)    Makes legs hurt, myopathy   Statins Other (See Comments)    "muscle break down", statin myopathy   Statins Support [Acid Blockers Support] Other (See Comments)   Codeine Other (See Comments)    Headache   Latex Rash    Chief Complaint  Patient presents with   Medical Management of Chronic Issues    Patient presents today for a 6 month follow-up.   Quality Metric Gaps    Zoster,TDAP,COVID booster     HPI: Patient is a 71 y.o. male this is a 31-month follow-up visit.  Problems include hypertension and hyperlipidemia.  He is intolerant of statins and takes only Zetia for lipids.  Blood pressure meds include benazepril, amlodipine, and carvedilol. There is a history of BPH for which he takes finasteride.  He denies nocturia. Uses Nexium regularly for reflux symptoms which are controlled. He uses Lasix along with Aldactone for edema.  Lasix is as needed based on previous days weight  Review of Systems:  Review of Systems  Constitutional: Negative.   HENT: Negative.    Respiratory: Negative.    Cardiovascular: Negative.   Genitourinary: Negative.   Musculoskeletal: Negative.   Neurological: Negative.   All other systems reviewed and are negative.  Past Medical History:  Diagnosis Date   Benign neoplasm of colon    Benign neoplasm of skin of upper limb, including shoulder    Carpal tunnel syndrome    Chest pain    a. normal cors by cath in 2015   Diaphragmatic hernia without mention of obstruction or gangrene    Dyslipidemia    Elevated prostate specific antigen  (PSA)    GERD (gastroesophageal reflux disease)    Herpes zoster without mention of complication    History of hiatal hernia    Hypertension    Hypertrophy of prostate with urinary obstruction and other lower urinary tract symptoms (LUTS)    Impotence of organic origin    Lumbago    Obesity, unspecified    Osteoarthrosis, unspecified whether generalized or localized, lower leg    Other and unspecified hyperlipidemia    Other malaise and fatigue    Other seborrheic keratosis    Special screening for malignant neoplasm of prostate    Stenosis, cervical spine    Unspecified tinnitus    Past Surgical History:  Procedure Laterality Date   ANTERIOR CERVICAL DECOMP/DISCECTOMY FUSION N/A 02/19/2018   Procedure: ANTERIOR CERVICAL DECOMPRESSION/DISCECTOMY FUSION - CERVICAL FOUR-CERVICAL FIVE - CERVICAL FIVE-CERVICAL SIX;  Surgeon: Earnie Larsson, MD;  Location: [facility];  Service: Neurosurgery;  Laterality: N/A;   CARDIOVASCULAR STRESS TEST  06/15/2012   Non-diagnostic for ischemia. No lexiscan EKG changes.   CHOLECYSTECTOMY  1992   EYE SURGERY     eyeband  2006   removal of right eyeband   INGUINAL HERNIA REPAIR  1984   left   INTRAOCULAR LENS REMOVAL  2008   right   LEFT HEART CATHETERIZATION WITH CORONARY ANGIOGRAM N/A 09/05/2013   Procedure: LEFT HEART CATHETERIZATION WITH CORONARY ANGIOGRAM;  Surgeon: Troy Sine, MD;  Location: [facility]  CATH LAB;  Service: Cardiovascular;  Laterality: N/A;   LOWER VENOUS EXTREMITY DOPPLER  01/26/2011   No evidence of thrombus or thrombophlebitis.   RETINAL DETACHMENT SURGERY  2001   right   TOTAL HIP ARTHROPLASTY  2012   bilateral   TOTAL KNEE ARTHROPLASTY  2011, 2012   bilateral   TRANSTHORACIC ECHOCARDIOGRAM  10/05/2009   EF >55%, normal LV size, systolic, and diastolic function.   Social History:   reports that he has never smoked. He has never used smokeless tobacco. He reports that he does not drink alcohol and does not use drugs.  Family History   Problem Relation Age of Onset   Diabetes Mother    Arrhythmia Mother    Hypertension Mother    Dementia Father    Pulmonary embolism Father    Hypertension Father    Cancer Sister        Liver cancer - Histocytic lymphoma   Cancer Sister 31       Skin cancer   Hypertension Sister 65   Diabetes Sister    Stroke Brother    Diabetes Brother    Hypertension Brother    Cancer Daughter    Cancer Maternal Grandmother        Skin cancer   Heart attack Maternal Grandfather    Cancer Paternal Grandfather    Hypertension Paternal Grandfather     Medications: Patient's Medications  New Prescriptions   No medications on file  Previous Medications   AMLODIPINE (NORVASC) 10 MG TABLET    TAKE 1 TABLET BY MOUTH EVERY DAY IN THE MORNING   ASPIRIN EC 81 MG TABLET    Take 81 mg by mouth every morning.   BENAZEPRIL (LOTENSIN) 40 MG TABLET    TAKE 1 TABLET BY MOUTH EVERY DAY   BRIMONIDINE (ALPHAGAN P) 0.1 % SOLN       CARVEDILOL (COREG) 6.25 MG TABLET    Take 1 tablet (6.25 mg total) by mouth 2 (two) times daily.   DORZOLAMIDE-TIMOLOL (COSOPT) 22.3-6.8 MG/ML OPHTHALMIC SOLUTION    Place 2 drops into the right eye 2 (two) times daily.    ESOMEPRAZOLE (NEXIUM) 40 MG CAPSULE    TAKE 1 CAPSULE BY MOUTH AT BEDTIME. FOR REFLUX   EZETIMIBE (ZETIA) 10 MG TABLET    TAKE 1 TABLET BY MOUTH EVERY DAY   FINASTERIDE (PROSCAR) 5 MG TABLET    TAKE 1 TABLET BY MOUTH EVERY DAY IN THE MORNING   FUROSEMIDE (LASIX) 40 MG TABLET    Take 1 tablet (40 mg total) by mouth daily. Take an additional 20 mg (0.5 tablet) for weight again. (3 pounds overnight or 5 pounds in 1 week.)   OMEGA-3 FATTY ACIDS (FISH OIL PO)    Take 1,400 mg by mouth.   POLYVINYL ALCOHOL-POVIDONE (REFRESH OP)    Place 1 drop into the right eye daily as needed (dry eyes/irritation).   SPIRONOLACTONE (ALDACTONE) 25 MG TABLET    TAKE 1 TABLET (25 MG TOTAL) BY MOUTH DAILY.  Modified Medications   No medications on file  Discontinued Medications   No  medications on file    Physical Exam:  Vitals:   09/01/21 0847  BP: 130/88  Pulse: 69  Temp: (!) 97.1 F (36.2 C)  SpO2: 98%  Weight: 279 lb 3.2 oz (126.6 kg)  Height: 5\' 10"  (1.778 m)   Body mass index is 40.06 kg/m. Wt Readings from Last 3 Encounters:  09/01/21 279 lb 3.2 oz (126.6 kg)  08/02/21 281  lb (127.5 kg)  06/22/21 281 lb (127.5 kg)    Physical Exam Vitals and nursing note reviewed.  Constitutional:      Appearance: Normal appearance. He is obese.  Cardiovascular:     Rate and Rhythm: Normal rate and regular rhythm.     Pulses: Normal pulses.  Pulmonary:     Effort: Pulmonary effort is normal.     Breath sounds: Normal breath sounds.  Abdominal:     General: Abdomen is flat. Bowel sounds are normal.     Palpations: Abdomen is soft.  Musculoskeletal:        General: Normal range of motion.  Neurological:     General: No focal deficit present.     Mental Status: He is alert and oriented to person, place, and time.  Psychiatric:        Mood and Affect: Mood normal.        Behavior: Behavior normal.        Thought Content: Thought content normal.        Judgment: Judgment normal.    Labs reviewed: Basic Metabolic Panel: Recent Labs    01/04/21 0835 03/01/21 0900 06/22/21 1556  NA 142 142 142  K 4.3 4.4 4.8  CL 106 110 106  CO2 24 28 24   GLUCOSE 106* 109* 84  BUN 15 15 23   CREATININE 1.05 0.97 1.17  CALCIUM 9.0 9.1 9.3  MG  --   --  2.1   Liver Function Tests: No results for input(s): AST, ALT, ALKPHOS, BILITOT, PROT, ALBUMIN in the last 8760 hours. No results for input(s): LIPASE, AMYLASE in the last 8760 hours. No results for input(s): AMMONIA in the last 8760 hours. CBC: Recent Labs    03/01/21 0900  WBC 7.9  NEUTROABS 5,601  HGB 15.8  HCT 47.7  MCV 89.8  PLT 204   Lipid Panel: Recent Labs    03/01/21 0900  CHOL 183  HDL 46  LDLCALC 112*  TRIG 137  CHOLHDL 4.0   TSH: No results for input(s): TSH in the last 8760  hours. A1C: Lab Results  Component Value Date   HGBA1C 5.4 03/01/2021     Assessment/Plan  1. Primary hypertension Blood pressure is good at 130/88.  Continue same medicines  2. Other hyperlipidemia Lipids are not quite at goal on Zetia.  Will reassess at next visit in 6 months  3. Benign prostatic hyperplasia with urinary frequency Continues with finasteride with no complaints  4. Need for Tdap vaccination Immunization updated today.  Recommended following up at pharmacy for zoster shot.  He is up-to-date on COVID shots and boosters per his history   Alain Honey, MD [place] [phone]

## 2021-09-01 NOTE — Patient Instructions (Signed)
Please continue to try and walk some for exercise Watch carbs in diet to control weight Get your shingles shot at local pharmacy

## 2021-09-02 LAB — COMPLETE METABOLIC PANEL WITH GFR
AG Ratio: 2.6 (calc) — ABNORMAL HIGH (ref 1.0–2.5)
ALT: 21 U/L (ref 9–46)
AST: 18 U/L (ref 10–35)
Albumin: 4.1 g/dL (ref 3.6–5.1)
Alkaline phosphatase (APISO): 83 U/L (ref 35–144)
BUN: 18 mg/dL (ref 7–25)
CO2: 29 mmol/L (ref 20–32)
Calcium: 9.3 mg/dL (ref 8.6–10.3)
Chloride: 106 mmol/L (ref 98–110)
Creat: 0.94 mg/dL (ref 0.70–1.28)
Globulin: 1.6 g/dL (calc) — ABNORMAL LOW (ref 1.9–3.7)
Glucose, Bld: 96 mg/dL (ref 65–99)
Potassium: 4.1 mmol/L (ref 3.5–5.3)
Sodium: 141 mmol/L (ref 135–146)
Total Bilirubin: 0.5 mg/dL (ref 0.2–1.2)
Total Protein: 5.7 g/dL — ABNORMAL LOW (ref 6.1–8.1)
eGFR: 86 mL/min/{1.73_m2} (ref >=60)

## 2021-09-02 LAB — LIPID PANEL
Cholesterol: 175 mg/dL (ref <200)
HDL: 39 mg/dL — ABNORMAL LOW (ref >=40)
LDL Cholesterol (Calc): 104 mg/dL (calc) — ABNORMAL HIGH
Non-HDL Cholesterol (Calc): 136 mg/dL (calc) — ABNORMAL HIGH (ref <130)
Total CHOL/HDL Ratio: 4.5 (calc) (ref <5.0)
Triglycerides: 198 mg/dL — ABNORMAL HIGH (ref <150)

## 2021-09-02 LAB — PSA, TOTAL AND FREE
PSA, % Free: 10 % (calc) — ABNORMAL LOW (ref >25)
PSA, Free: 0.3 ng/mL
PSA, Total: 2.9 ng/mL (ref <=4.0)

## 2021-09-20 ENCOUNTER — Other Ambulatory Visit: Payer: Self-pay | Admitting: Family Medicine

## 2021-09-20 ENCOUNTER — Other Ambulatory Visit: Payer: Self-pay | Admitting: Cardiovascular Disease

## 2021-09-20 DIAGNOSIS — I2510 Atherosclerotic heart disease of native coronary artery without angina pectoris: Secondary | ICD-10-CM

## 2021-09-20 DIAGNOSIS — I5189 Other ill-defined heart diseases: Secondary | ICD-10-CM

## 2021-09-20 DIAGNOSIS — I10 Essential (primary) hypertension: Secondary | ICD-10-CM

## 2021-09-20 DIAGNOSIS — R0609 Other forms of dyspnea: Secondary | ICD-10-CM

## 2021-09-20 DIAGNOSIS — E785 Hyperlipidemia, unspecified: Secondary | ICD-10-CM

## 2021-11-02 ENCOUNTER — Ambulatory Visit (INDEPENDENT_AMBULATORY_CARE_PROVIDER_SITE_OTHER): Payer: PPO | Admitting: Family Medicine

## 2021-11-02 ENCOUNTER — Encounter: Payer: Self-pay | Admitting: Family Medicine

## 2021-11-02 ENCOUNTER — Other Ambulatory Visit: Payer: Self-pay

## 2021-11-02 VITALS — BP 130/78 | HR 79 | Temp 97.3°F | Ht 70.0 in | Wt 278.0 lb

## 2021-11-02 DIAGNOSIS — J0130 Acute sphenoidal sinusitis, unspecified: Secondary | ICD-10-CM | POA: Diagnosis not present

## 2021-11-02 DIAGNOSIS — J323 Chronic sphenoidal sinusitis: Secondary | ICD-10-CM | POA: Diagnosis not present

## 2021-11-02 MED ORDER — AMOXICILLIN-POT CLAVULANATE 875-125 MG PO TABS: 1.0000 | ORAL_TABLET | Freq: Two times a day (BID) | ORAL | 0 refills | Status: DC

## 2021-11-02 NOTE — Progress Notes (Signed)
? ? ?Provider:  ?Alain Honey, MD ? ?Careteam: ?Patient Care Team: ?Wardell Honour, MD as PCP - General (Family Medicine) ?Troy Sine, MD as PCP - Cardiology (Cardiology) ? ?PLACE OF SERVICE:  ?[facility]  ?Advanced Directive information ?  ? ?Allergies  ?Allergen Reactions  ? Lipitor [Atorvastatin] Other (See Comments)  ?  Leg pain/cramps; statin myopathy  ? Red Yeast Rice [Cholestin] Other (See Comments)  ?  Makes legs hurt, myopathy  ? Statins Other (See Comments)  ?  "muscle break down", statin myopathy  ? Statins Support [Acid Blockers Support] Other (See Comments)  ? Codeine Other (See Comments)  ?  Headache  ? Latex Rash  ? ? ?Chief Complaint  ?Patient presents with  ? Acute Visit  ?  Chest congestion   ? ? ? ?HPI: Patient is a 71 y.o. male patient complains of cough productive of yellowish-green sputum.  Also complains of some congestion and headache in the occipital part of his head but he denies any frontal congestion or headache.  No fever or chills.  He has heard some wheezes. ? ?Review of Systems:  ?Review of Systems  ?Constitutional:  Negative for chills and fever.  ?Respiratory:  Positive for cough.   ?Neurological:  Positive for headaches.  ?All other systems reviewed and are negative. ? ?Past Medical History:  ?Diagnosis Date  ? Benign neoplasm of colon   ? Benign neoplasm of skin of upper limb, including shoulder   ? Carpal tunnel syndrome   ? Chest pain   ? a. normal cors by cath in 2015  ? Diaphragmatic hernia without mention of obstruction or gangrene   ? Dyslipidemia   ? Elevated prostate specific antigen (PSA)   ? GERD (gastroesophageal reflux disease)   ? Herpes zoster without mention of complication   ? History of hiatal hernia   ? Hypertension   ? Hypertrophy of prostate with urinary obstruction and other lower urinary tract symptoms (LUTS)   ? Impotence of organic origin   ? Lumbago   ? Obesity, unspecified   ? Osteoarthrosis, unspecified whether generalized or localized, lower  leg   ? Other and unspecified hyperlipidemia   ? Other malaise and fatigue   ? Other seborrheic keratosis   ? Special screening for malignant neoplasm of prostate   ? Stenosis, cervical spine   ? Unspecified tinnitus   ? ?Past Surgical History:  ?Procedure Laterality Date  ? ANTERIOR CERVICAL DECOMP/DISCECTOMY FUSION N/A 02/19/2018  ? Procedure: ANTERIOR CERVICAL DECOMPRESSION/DISCECTOMY FUSION - CERVICAL FOUR-CERVICAL FIVE - CERVICAL FIVE-CERVICAL SIX;  Surgeon: Earnie Larsson, MD;  Location: [facility];  Service: Neurosurgery;  Laterality: N/A;  ? CARDIOVASCULAR STRESS TEST  06/15/2012  ? Non-diagnostic for ischemia. No lexiscan EKG changes.  ? CHOLECYSTECTOMY  1992  ? EYE SURGERY    ? eyeband  2006  ? removal of right eyeband  ? [place]  ? left  ? INTRAOCULAR LENS REMOVAL  2008  ? right  ? LEFT HEART CATHETERIZATION WITH CORONARY ANGIOGRAM N/A 09/05/2013  ? Procedure: LEFT HEART CATHETERIZATION WITH CORONARY ANGIOGRAM;  Surgeon: Troy Sine, MD;  Location: [facility] CATH LAB;  Service: Cardiovascular;  Laterality: N/A;  ? LOWER VENOUS EXTREMITY DOPPLER  01/26/2011  ? No evidence of thrombus or thrombophlebitis.  ? RETINAL DETACHMENT SURGERY  2001  ? right  ? TOTAL HIP ARTHROPLASTY  2012  ? bilateral  ? TOTAL KNEE ARTHROPLASTY  2011, 2012  ? bilateral  ? TRANSTHORACIC ECHOCARDIOGRAM  10/05/2009  ? EF >55%, normal LV size, systolic, and diastolic function.  ? ?Social History: ?  reports that he has never smoked. He has never used smokeless tobacco. He reports that he does not drink alcohol and does not use drugs. ? ?Family History  ?Problem Relation Age of Onset  ? Diabetes Mother   ? Arrhythmia Mother   ? Hypertension Mother   ? Dementia Father   ? Pulmonary embolism Father   ? Hypertension Father   ? Cancer Sister   ?     Liver cancer - Histocytic lymphoma  ? Cancer Sister 26  ?     Skin cancer  ? Hypertension Sister 3  ? Diabetes Sister   ? Stroke Brother   ? Diabetes Brother   ? Hypertension Brother   ?  Cancer Daughter   ? Cancer Maternal Grandmother   ?     Skin cancer  ? Heart attack Maternal Grandfather   ? Cancer Paternal Grandfather   ? Hypertension Paternal Grandfather   ? ? ?Medications: ?Patient's Medications  ?New Prescriptions  ? No medications on file  ?Previous Medications  ? AMLODIPINE (NORVASC) 10 MG TABLET    TAKE 1 TABLET BY MOUTH EVERY DAY IN THE MORNING  ? ASPIRIN EC 81 MG TABLET    Take 81 mg by mouth every morning.  ? BENAZEPRIL (LOTENSIN) 40 MG TABLET    TAKE 1 TABLET BY MOUTH EVERY DAY  ? BRIMONIDINE (ALPHAGAN P) 0.1 % SOLN      ? CARVEDILOL (COREG) 6.25 MG TABLET    Take 1 tablet (6.25 mg total) by mouth 2 (two) times daily.  ? DORZOLAMIDE-TIMOLOL (COSOPT) 22.3-6.8 MG/ML OPHTHALMIC SOLUTION    Place 2 drops into the right eye 2 (two) times daily.   ? ESOMEPRAZOLE (NEXIUM) 40 MG CAPSULE    TAKE 1 CAPSULE BY MOUTH AT BEDTIME. FOR REFLUX  ? EZETIMIBE (ZETIA) 10 MG TABLET    TAKE 1 TABLET BY MOUTH EVERY DAY  ? FINASTERIDE (PROSCAR) 5 MG TABLET    TAKE 1 TABLET BY MOUTH EVERY DAY IN THE MORNING  ? FUROSEMIDE (LASIX) 40 MG TABLET    Take 1 tablet (40 mg total) by mouth daily. Take an additional 20 mg (0.5 tablet) for weight again. (3 pounds overnight or 5 pounds in 1 week.)  ? OMEGA-3 FATTY ACIDS (FISH OIL PO)    Take 1,400 mg by mouth.  ? POLYVINYL ALCOHOL-POVIDONE (REFRESH OP)    Place 1 drop into the right eye daily as needed (dry eyes/irritation).  ? SPIRONOLACTONE (ALDACTONE) 25 MG TABLET    TAKE 1 TABLET (25 MG TOTAL) BY MOUTH DAILY.  ?Modified Medications  ? No medications on file  ?Discontinued Medications  ? No medications on file  ? ? ?Physical Exam: ? ?Vitals:  ? 11/02/21 1336  ?BP: 130/78  ?Pulse: 79  ?Temp: (!) 97.3 ?F (36.3 ?C)  ?TempSrc: Temporal  ?SpO2: 96%  ?Weight: 278 lb (126.1 kg)  ?Height: '5\' 10"'$  (1.778 m)  ? ?Body mass index is 39.89 kg/m?. ?Wt Readings from Last 3 Encounters:  ?11/02/21 278 lb (126.1 kg)  ?09/01/21 279 lb 3.2 oz (126.6 kg)  ?08/02/21 281 lb (127.5 kg)   ? ? ?Physical Exam ?Vitals and nursing note reviewed.  ?Constitutional:   ?   Appearance: Normal appearance.  ?HENT:  ?   Head: Normocephalic.  ?   Right Ear: Tympanic membrane normal.  ?   Left Ear: Tympanic membrane normal.  ?Cardiovascular:  ?  Rate and Rhythm: Normal rate.  ?Pulmonary:  ?   Effort: Pulmonary effort is normal.  ?   Breath sounds: Wheezing present.  ?Neurological:  ?   General: No focal deficit present.  ?   Mental Status: He is alert and oriented to person, place, and time.  ? ? ?Labs reviewed: ?Basic Metabolic Panel: ?Recent Labs  ?  03/01/21 ?0900 06/22/21 ?1556 09/01/21 ?0949  ?NA 142 142 141  ?K 4.4 4.8 4.1  ?CL 110 106 106  ?CO2 '28 24 29  '$ ?GLUCOSE 109* 84 96  ?BUN '15 23 18  '$ ?CREATININE 0.97 1.17 0.94  ?CALCIUM 9.1 9.3 9.3  ?MG  --  2.1  --   ? ?Liver Function Tests: ?Recent Labs  ?  09/01/21 ?0949  ?AST 18  ?ALT 21  ?BILITOT 0.5  ?PROT 5.7*  ? ?No results for input(s): LIPASE, AMYLASE in the last 8760 hours. ?No results for input(s): AMMONIA in the last 8760 hours. ?CBC: ?Recent Labs  ?  03/01/21 ?0900  ?WBC 7.9  ?NEUTROABS 5,601  ?HGB 15.8  ?HCT 47.7  ?MCV 89.8  ?PLT 204  ? ?Lipid Panel: ?Recent Labs  ?  03/01/21 ?0900 09/01/21 ?0949  ?CHOL 183 175  ?HDL 46 39*  ?LDLCALC 112* 104*  ?TRIG 137 198*  ?CHOLHDL 4.0 4.5  ? ?TSH: ?No results for input(s): TSH in the last 8760 hours. ?A1C: ?Lab Results  ?Component Value Date  ? HGBA1C 5.4 03/01/2021  ? ? ? ?Assessment/Plan ? ?1. Acute non-recurrent sphenoidal sinusitis ?With posterior occipital headaches and productive cough and minimal wheeze suspect sinus infection will treat with Augmentin Mucinex DM.  Patient also request albuterol inhaler to help with breathing and wheeze ? ? ?Alain Honey, MD ?[facility] ?[phone]  ? ?

## 2021-11-03 MED ORDER — ALBUTEROL SULFATE HFA 108 (90 BASE) MCG/ACT IN AERS: 1.0000 | INHALATION_SPRAY | Freq: Four times a day (QID) | RESPIRATORY_TRACT | 1 refills | Status: DC | PRN

## 2021-11-03 NOTE — Telephone Encounter (Signed)
Pended Rx for approval.  ? ? ? ?OV Note Dated 11/02/2021: ?Assessment/Plan ?  ?1. Acute non-recurrent sphenoidal sinusitis ?With posterior occipital headaches and productive cough and minimal wheeze suspect sinus infection will treat with Augmentin Mucinex DM.  Patient also request albuterol inhaler to help with breathing and wheeze ?  ?

## 2021-11-09 ENCOUNTER — Other Ambulatory Visit: Payer: Self-pay | Admitting: Family Medicine

## 2021-11-09 DIAGNOSIS — J323 Chronic sphenoidal sinusitis: Secondary | ICD-10-CM

## 2021-11-09 MED ORDER — AMOXICILLIN-POT CLAVULANATE 875-125 MG PO TABS: 1.0000 | ORAL_TABLET | Freq: Two times a day (BID) | ORAL | 0 refills | Status: DC

## 2021-11-22 ENCOUNTER — Other Ambulatory Visit: Payer: Self-pay | Admitting: Medical

## 2021-12-06 ENCOUNTER — Other Ambulatory Visit: Payer: Self-pay | Admitting: Nurse Practitioner

## 2021-12-06 NOTE — Telephone Encounter (Signed)
High risk or very high risk warning populated when attempting to refill medication. RX request sent to PCP for review and approval if warranted.   

## 2021-12-09 ENCOUNTER — Other Ambulatory Visit: Payer: Self-pay | Admitting: Medical

## 2022-02-03 NOTE — Progress Notes (Signed)
Cardiology Clinic Note   Patient Name: Anthony [name]. Date of Encounter: 02/08/2022  Primary Care Provider:  Frederica Kuster, MD Primary Cardiologist:  Anthony Guadalajara, MD  Patient Profile    Anthony [name]. Anthony [name] 71 year old male presents the clinic today for follow-up evaluation of his chest pain and HTN.  Past Medical History    Past Medical History:  Diagnosis Date   Benign neoplasm of colon    Benign neoplasm of skin of upper limb, including shoulder    Carpal tunnel syndrome    Chest pain    a. normal cors by cath in 2015   Diaphragmatic hernia without mention of obstruction or gangrene    Dyslipidemia    Elevated prostate specific antigen (PSA)    GERD (gastroesophageal reflux disease)    Herpes zoster without mention of complication    History of hiatal hernia    Hypertension    Hypertrophy of prostate with urinary obstruction and other lower urinary tract symptoms (LUTS)    Impotence of organic origin    Lumbago    Obesity, unspecified    Osteoarthrosis, unspecified whether generalized or localized, lower leg    Other and unspecified hyperlipidemia    Other malaise and fatigue    Other seborrheic keratosis    Special screening for malignant neoplasm of prostate    Stenosis, cervical spine    Unspecified tinnitus    Past Surgical History:  Procedure Laterality Date   ANTERIOR CERVICAL DECOMP/DISCECTOMY FUSION N/A 02/19/2018   Procedure: ANTERIOR CERVICAL DECOMPRESSION/DISCECTOMY FUSION - CERVICAL FOUR-CERVICAL FIVE - CERVICAL FIVE-CERVICAL SIX;  Surgeon: Anthony Sicks, MD;  Location: [facility] OR;  Service: Neurosurgery;  Laterality: N/A;   CARDIOVASCULAR STRESS TEST  06/15/2012   Non-diagnostic for ischemia. No lexiscan EKG changes.   CHOLECYSTECTOMY  1992   EYE SURGERY     eyeband  2006   removal of right eyeband   INGUINAL HERNIA REPAIR  1984   left   INTRAOCULAR LENS REMOVAL  2008   right   LEFT HEART CATHETERIZATION WITH CORONARY ANGIOGRAM N/A 09/05/2013    Procedure: LEFT HEART CATHETERIZATION WITH CORONARY ANGIOGRAM;  Surgeon: Anthony Bihari, MD;  Location: [facility] CATH LAB;  Service: Cardiovascular;  Laterality: N/A;   LOWER VENOUS EXTREMITY DOPPLER  01/26/2011   No evidence of thrombus or thrombophlebitis.   RETINAL DETACHMENT SURGERY  2001   right   TOTAL HIP ARTHROPLASTY  2012   bilateral   TOTAL KNEE ARTHROPLASTY  2011, 2012   bilateral   TRANSTHORACIC ECHOCARDIOGRAM  10/05/2009   EF >55%, normal LV size, systolic, and diastolic function.    Allergies  Allergies  Allergen Reactions   Lipitor [Atorvastatin] Other (See Comments)    Leg pain/cramps; statin myopathy   Red Yeast Rice [Cholestin] Other (See Comments)    Makes legs hurt, myopathy   Statins Other (See Comments)    "muscle break down", statin myopathy   Statins Support [Acid Blockers Support] Other (See Comments)   Codeine Other (See Comments)    Headache   Latex Rash    History of Present Illness    Mr. Bressan has a PMH of hypertension, dysphagia, esophageal stenosis, GERD, acid reflux, DJD, BPH, mixed hyperlipidemia, morbid obesity, clinical depression, and chest pain.  He underwent nuclear stress test 10/13 for chest pain which showed normal perfusion without scar or ischemia.  An echocardiogram 1/15 showed an ejection fraction of 55-60%, G1 DD mild left ventricular hypertrophy, mild mitral annular calcification with mildly thickened leaflets  and trivial mitral regurgitation.  It was felt that his chest pain was most likely musculoskeletal.  A CTA in 2020 showed minimal coronary plaque involving his LAD ostium and OM 1.  He was also noted to have atherosclerosis of his thoracic aorta.   He was seen by Dr. Tresa [name] 6/21 with mild chest tightness for 3 to 4 weeks.  He reported symptoms were not always with exertion.  He reported decreased energy.  He was having difficulty with his reflux.  He also reported some sharp pain and discomfort which was going to his back and was  nonexertional.  He was seen Dr. Matthias [name] for esophageal dilation in the future.  He reported he had stopped his statin therapy and Nexlizet was recommended.  Weight loss and OSA were also discussed.   He was last seen and evaluated by Dr. Tresa [name] on 09/28/2020.  He had not pursued sleep evaluation.  He felt he was sleeping well and reported typically sleeping 8 to 9 hours per night.  He denied any exertion symptomology but did note very sharp twinges anteriorly at times.  He continued to have issues with his neck and had an ablation procedure planned.  He remains sedentary due to arthritic issues and post bilateral knee and hip replacement surgeries.  He was tolerating his ezetimibe well.  Lipid panel showed LDL of 113.  Fish oil was recommended.  Improving his diet was discussed.  Follow-up was planned for 1 year and as needed.   He [facility] ED [facility]/16/[facility].  He reported several days of chest pain with no associated nausea or vomiting.  He denied shortness of breath.  His blood pressure at that time was 161/84 with pulse of 74.  He reported that the pain was in the center of his chest, radiated to his jaw and down his right arm.  He also had shortness of breath and headache.  His lab work was unremarkable.  Chest x-ray showed no acute abnormalities.  EKG showed normal sinus rhythm.  High-sensitivity troponins negative.   He presented to the clinic 4/8/ 22 for follow-up evaluation stated he was at the [facility] visit at [facility] when they insisted he present to the emergency department for further evaluation.  He reported that he was having some pressure across his chest that he felt was related to reflux.  He had not had any further episodes of chest pain.  He brought blood pressure log  which showed average blood pressures in the 150s over 80s at home.  His blood pressure in the office was well controlled at 130/78.  I  added metoprolol tartrate 25 mg twice daily to his medication regimen, asked him to  maintain a blood pressure log, gave him the salty 6 diet sheet, and had him increase physical activity as tolerated.  We planned follow-up in 1 month.   He presented to the clinic 12/28/2020 for follow-up evaluation stated he felt fairly well .  He had noticed increased work of breathing with increased physical activity.  He had also had a 5.5 pound weight increase since his visit 1 month ago.  He reported increased lower extremity edema.  He also reported dietary indiscretion.  I will gave him the salty 6 diet sheet, had him increase physical activity as tolerated, increased his Lasix to 40 mg daily, changed his metoprolol to succinate 25 daily, ordered a BMP in 1 week and planned follow-up in 1 month.  His BMP was unremarkable.  Presented to clinic 02/09/21  for follow-up evaluation stated he had much better blood pressures at home.  He reported his systolic blood pressures were in the 125-130 range.  He had been tolerating his increased furosemide dosing well.  We reviewed his most recent lab work.  He reported that he had been noticing less lower extremity edema.  He was limited in his physical activity by his joint pain.  We discussed increasing his physical activity through activities such as swimming and bicycling.  He did note some increased fatigue after doing bouts of yard work.  We  continued his current medication regimen, continued heart healthy low-sodium diet, asked him to increase his physical activity as tolerated with a goal of 150 minutes of moderate physical activity per week and planned follow-up in 6 months.  He was seen by Dr. Tresa [name] 08/02/2021.  During that time he denied chest pain and shortness of breath.  He continued to have knee and hip pain.  His ailments limited to his physical activity.  He was occasionally taking extra furosemide for increased lower extremity swelling.  He presents to the clinic today for follow-up evaluation states he notices shortness of breath with walking  distances over about 100 feet.  He reports that he is limited in his physical activity due to his right hip pain.  He is planning to go back to the gym.  He previously enjoyed swimming and using an exercise bike.  We reviewed his previous CTA and he is cholesterol.  He will be going to his PCP in the next few weeks to have labs redrawn.  He may be a candidate for PCSK9 inhibitor and is willing to try PCSK9 if needed.  I have asked him to take a daily fiber supplement, increase physical activity slowly, and we will plan follow-up in 6 months.   Today he denies chest pain, shortness of breath, fatigue, palpitations, melena, hematuria, hemoptysis, diaphoresis, weakness, presyncope, syncope, orthopnea, and PND.  Home Medications    Prior to Admission medications   Medication Sig Start Date End Date Taking? Authorizing Provider  amLODipine (NORVASC) 10 MG tablet TAKE 1 TABLET BY MOUTH EVERY DAY IN THE MORNING 07/28/20   Anthony Bihari, MD  aspirin EC 81 MG tablet Take 81 mg by mouth every morning.    [provider]  benazepril (LOTENSIN) 40 MG tablet TAKE 1 TABLET BY MOUTH EVERY DAY 09/21/20   Reed, Tiffany L, DO  brimonidine (ALPHAGAN P) 0.1 % SOLN     [provider]  cyclobenzaprine (FLEXERIL) 10 MG tablet Take 10 mg by mouth 3 (three) times daily. As needed 05/10/20   [provider]  dorzolamide-timolol (COSOPT) 22.3-6.8 MG/ML ophthalmic solution Place 2 drops into the right eye 2 (two) times daily.     [provider]  esomeprazole (NEXIUM) 40 MG capsule TAKE 1 CAPSULE BY MOUTH AT BEDTIME. FOR REFLUX 09/21/20   Reed, Tiffany L, DO  ezetimibe (ZETIA) 10 MG tablet Take 1 tablet (10 mg total) by mouth daily. 01/20/21   Anthony Kuster, MD  finasteride (PROSCAR) 5 MG tablet TAKE 1 TABLET BY MOUTH EVERY DAY IN THE MORNING 06/02/20   Reed, Tiffany L, DO  furosemide (LASIX) 20 MG tablet Take 1 tablet (20 mg total) every other day, alternating with 2 tablets (40 mg total)  every other day. 01/05/21   Ronney Asters, NP  metoprolol succinate (TOPROL XL) 25 MG 24 hr tablet Take 1.5 tablets (37.5 mg total) by mouth daily. 01/06/21   Jelani Vreeland,  Thomasene Ripple, NP  Omega-3 Fatty Acids (FISH OIL PO) Take 1,400 mg by mouth.    [provider]  Polyvinyl Alcohol-Povidone (REFRESH OP) Place 1 drop into the right eye daily as needed (dry eyes/irritation).    [provider]  spironolactone (ALDACTONE) 25 MG tablet TAKE 1 TABLET (25 MG TOTAL) BY MOUTH DAILY. 12/11/20   Anthony Bihari, MD    Family History    Family History  Problem Relation Age of Onset   Diabetes Mother    Arrhythmia Mother    Hypertension Mother    Dementia Father    Pulmonary embolism Father    Hypertension Father    Cancer Sister        Liver cancer - Histocytic lymphoma   Cancer Sister 24       Skin cancer   Hypertension Sister 75   Diabetes Sister    Stroke Brother    Diabetes Brother    Hypertension Brother    Cancer Daughter    Cancer Maternal Grandmother        Skin cancer   Heart attack Maternal Grandfather    Cancer Paternal Grandfather    Hypertension Paternal Grandfather    He indicated that his mother is deceased. He indicated that his father is deceased. He indicated that only one of his two sisters is alive. He indicated that his brother is deceased. He indicated that his maternal grandmother is deceased. He indicated that his maternal grandfather is deceased. He indicated that his paternal grandmother is deceased. He indicated that his paternal grandfather is deceased. He indicated that his daughter is alive. He indicated that his son is alive.   Social History    Social History   Socioeconomic History   Marital status: Married    Spouse name: Not on file   Number of children: Not on file   Years of education: Not on file   Highest education level: Not on file  Occupational History   Occupation: retired Cabin crew     Comment: Statistician  Tobacco  Use   Smoking status: Never   Smokeless tobacco: Never  Building services engineer Use: Never used  Substance and Sexual Activity   Alcohol use: No   Drug use: No   Sexual activity: Yes    Partners: Female    Comment: wife  Other Topics Concern   Not on file  Social History Narrative   Married   Never smoked   Alcohol none   Exercise -gym 3 days a week (join 3 months ago)   Living Will   Social Determinants of Health   Financial Resource Strain: Low Risk  (10/31/2017)   Overall Financial Resource Strain (CARDIA)    Difficulty of Paying Living Expenses: Not hard at all  Food Insecurity: No Food Insecurity (10/31/2017)   Hunger Vital Sign    Worried About Running Out of Food in the Last Year: Never true    Ran Out of Food in the Last Year: Never true  Transportation Needs: No Transportation Needs (10/31/2017)   PRAPARE - Administrator, Civil Service (Medical): No    Lack of Transportation (Non-Medical): No  Physical Activity: Insufficiently Active (10/31/2017)   Exercise Vital Sign    Days of Exercise per Week: 2 days    Minutes of Exercise per Session: 60 min  Stress: No Stress Concern Present (10/31/2017)   [employer] of Occupational Health - Occupational Stress Questionnaire    Feeling  of Stress : Only a little  Social Connections: Moderately Integrated (10/31/2017)   Social Connection and Isolation Panel [NHANES]    Frequency of Communication with Friends and Family: Twice a week    Frequency of Social Gatherings with Friends and Family: Twice a week    Attends Religious Services: More than 4 times per year    Active Member of [employer] or Organizations: No    Attends Banker Meetings: Never    Marital Status: Married  Catering manager Violence: Not At Risk (10/31/2017)   Humiliation, Afraid, Rape, and Kick questionnaire    Fear of Current or Ex-Partner: No    Emotionally Abused: No    Physically Abused: No    Sexually Abused: No     Review of  Systems    General:  No chills, fever, night sweats or weight changes.  Cardiovascular:  No chest pain, dyspnea on exertion, edema, orthopnea, palpitations, paroxysmal nocturnal dyspnea. Dermatological: No rash, lesions/masses Respiratory: No cough, dyspnea Urologic: No hematuria, dysuria Abdominal:   No nausea, vomiting, diarrhea, bright red blood per rectum, melena, or hematemesis Neurologic:  No visual changes, wkns, changes in mental status. All other systems reviewed and are otherwise negative except as noted above.  Physical Exam    VS:  BP 122/64   Pulse 70   Ht 5\' 11"  (1.803 m)   Wt 285 lb (129.3 kg)   SpO2 99%   BMI 39.75 kg/m  , BMI Body mass index is 39.75 kg/m. GEN: Well nourished, well developed, in no acute distress. HEENT: normal. Neck: Supple, no JVD, carotid bruits, or masses. Cardiac: RRR, no murmurs, rubs, or gallops. No clubbing, cyanosis, edema.  Radials/DP/PT 2+ and equal bilaterally.  Respiratory:  Respirations regular and unlabored, clear to auscultation bilaterally. GI: Soft, nontender, nondistended, BS + x 4. MS: no deformity or atrophy. Skin: warm and dry, no rash. Neuro:  Strength and sensation are intact. Psych: Normal affect.  Accessory Clinical Findings    Recent Labs: 03/01/2021: Hemoglobin 15.8; Platelets 204 06/22/2021: Magnesium 2.1 09/01/2021: ALT 21; BUN 18; Creat 0.94; Potassium 4.1; Sodium 141   Recent Lipid Panel    Component Value Date/Time   CHOL 175 09/01/2021 0949   CHOL 173 12/02/2019 0844   CHOL 191 07/22/2013 0835   TRIG 198 (H) 09/01/2021 0949   TRIG 253 (H) 07/22/2013 0835   HDL 39 (L) 09/01/2021 0949   HDL 41 12/02/2019 0844   HDL 38 (L) 07/22/2013 0835   CHOLHDL 4.5 09/01/2021 0949   VLDL 35 (H) 10/27/2016 0825   LDLCALC 104 (H) 09/01/2021 0949   LDLCALC 102 (H) 07/22/2013 0835    ECG personally reviewed by me today-  None today.   EKG 11/27/2020 normal sinus rhythm cannot rule out inferior infarct undetermined  age 2 bpm- No acute changes   Echocardiogram 08/30/2018   Study Conclusions   - Left ventricle: The cavity size was normal. There was mild    concentric hypertrophy. Systolic function was normal. The    estimated ejection fraction was in the range of 60% to 65%. Wall    motion was normal; there were no regional wall motion    abnormalities. Features are consistent with a pseudonormal left    ventricular filling pattern, with concomitant abnormal relaxation    and increased filling pressure (grade 2 diastolic dysfunction).    Doppler parameters are consistent with elevated ventricular    end-diastolic filling pressure.  - Aortic valve: There was no regurgitation.  -  Mitral valve: There was mild regurgitation.  - Left atrium: The atrium was moderately dilated.  - Right ventricle: The cavity size was mildly dilated. Wall    thickness was normal. Systolic function was normal.  - Right atrium: The atrium was normal in size.  - Tricuspid valve: There was mild regurgitation.  - Pulmonic valve: There was mild regurgitation.  - Pulmonary arteries: Systolic pressure was within the normal    range. PA peak pressure: 35 mm Hg (S).  - Inferior vena cava: The vessel was normal in size.  - Pericardium, extracardiac: There was no pericardial effusion.   Coronary CTA 09/14/2018 IMPRESSION: 1. Coronary calcium score of 26.4. This was 28th percentile for age and sex matched control.   2. Normal coronary origin with right dominance.   3. Minimal plaque in ostial LAD and OM1.   Chilton Si, MD  Assessment & Plan   1.  Essential hypertension-BP today 122/64.  Well controlled at home.   Continue amlodipine, benazepril,, furosemide, spironolactone, carvedilol Heart healthy low-sodium diet-salty 6 reviewed.   Increase physical activity as tolerated Continue weight loss  Chest pain-denies recent episodes of chest discomfort.   Coronary CTA 09/14/2018 which showed calcium score of 26.4 and  minimal plaque in his ostial LAD and OM1.  His chest discomfort was previously felt to be related to musculoskeletal issues. Continue amlodipine, aspirin Heart healthy low-sodium diet Increase physical activity as tolerated   Grade 2 diastolic dysfunction/bilateral lower extremity edema-euvolemic .  Continues to work on heart healthy  diet.  Weight stable. Continue furosemide Daily weights-contact office with a weight increase of 3 pounds overnight or 5 pounds in 1 week Continue lower extremity support stockings Heart healthy low-sodium diet Increase physical activity as tolerated  Hyperlipidemia-LDL 104.  Statin intolerant.  May be a good candidate for PCSK9 inhibitor. Continue Zetia, fish oil Heart healthy low-sodium high-fiber diet Increase physical activity as tolerated  Follows with PCP  Morbid obesity-weight today 285 pounds.  Limited in physical activity due to right hip pain.  Is planning to go back to the gym to do low impact type exercises. Continue weight loss Increase physical activity as tolerated Heart healthy low-sodium diet  Disposition: Follow-up with Dr. Tresa [name] or me in 6 months.  Thomasene Ripple. Mendy Lapinsky NP-C    02/08/2022, 11:21 AM [facility] [address] Office [phone] Fax [phone]  Notice: This dictation was prepared with Dragon dictation along with smaller phrase technology. Any transcriptional errors that result from this process are unintentional and may not be corrected upon review.  I spent 14 minutes examining this patient, reviewing medications, and using patient centered shared decision making involving her cardiac care.  Prior to her visit I spent greater than 20 minutes reviewing her past medical history,  medications, and prior cardiac tests.

## 2022-02-08 ENCOUNTER — Encounter: Payer: Self-pay | Admitting: General Practice

## 2022-02-08 ENCOUNTER — Ambulatory Visit (INDEPENDENT_AMBULATORY_CARE_PROVIDER_SITE_OTHER): Payer: PPO | Admitting: General Practice

## 2022-02-08 VITALS — BP 122/64 | HR 70 | Ht 71.0 in | Wt 285.0 lb

## 2022-02-08 DIAGNOSIS — R079 Chest pain, unspecified: Secondary | ICD-10-CM | POA: Diagnosis not present

## 2022-02-08 DIAGNOSIS — E785 Hyperlipidemia, unspecified: Secondary | ICD-10-CM

## 2022-02-08 DIAGNOSIS — I5189 Other ill-defined heart diseases: Secondary | ICD-10-CM | POA: Diagnosis not present

## 2022-02-08 DIAGNOSIS — I10 Essential (primary) hypertension: Secondary | ICD-10-CM

## 2022-02-08 NOTE — Patient Instructions (Addendum)
Medication Instructions:  The current medical regimen is effective;  continue present plan and medications as directed. Please refer to the Current Medication list given to you today.   *If you need a refill on your cardiac medications before your next appointment, please call your pharmacy*  Lab Work:   Testing/Procedures:  NONE    NONE  If you have labs (blood work) drawn today and your tests are completely normal, you will receive your results only by: [place] (if you have MyChart) OR  A paper copy in the mail If you have any lab test that is abnormal or we need to change your treatment, we will call you to review the results.  Special Instructions PLEASE INCREASE PHYSICAL ACTIVITY SLOWLY-OK TO GO TO THE GYM   START TAKING A FIBER SUPPLEMENT DAILY  Follow-Up: Your next appointment:  6 month(s) In Person with Shelva Majestic, MD  I HAVE SCHEDULED YOUR FOLLOW-UP APPOINTMENT 08-05-2022 at 10:05AM. PLEASE ARRIVE EARLY FOR CHECK-IN  At [facility], you and your health needs are our priority.  As part of our continuing mission to provide you with exceptional heart care, we have created designated Provider Care Teams.  These Care Teams include your primary Cardiologist (physician) and Advanced Practice Providers (APPs -  Physician Assistants and Nurse Practitioners) who all work together to provide you with the care you need, when you need it.    Important Information About Sugar

## 2022-03-01 ENCOUNTER — Ambulatory Visit: Payer: PPO | Admitting: Family Medicine

## 2022-03-08 ENCOUNTER — Ambulatory Visit: Payer: PPO | Admitting: Family Medicine

## 2022-03-11 ENCOUNTER — Ambulatory Visit: Payer: PPO | Admitting: Family Medicine

## 2022-03-22 ENCOUNTER — Other Ambulatory Visit: Payer: Self-pay

## 2022-03-22 MED ORDER — FINASTERIDE 5 MG PO TABS: ORAL_TABLET | ORAL | 1 refills | Status: DC

## 2022-03-30 ENCOUNTER — Ambulatory Visit (INDEPENDENT_AMBULATORY_CARE_PROVIDER_SITE_OTHER): Payer: PPO | Admitting: Family Medicine

## 2022-03-30 ENCOUNTER — Encounter: Payer: Self-pay | Admitting: Family Medicine

## 2022-03-30 VITALS — BP 128/88 | HR 74 | Temp 98.4°F | Ht 71.0 in | Wt 288.0 lb

## 2022-03-30 DIAGNOSIS — C44309 Unspecified malignant neoplasm of skin of other parts of face: Secondary | ICD-10-CM | POA: Insufficient documentation

## 2022-03-30 DIAGNOSIS — R739 Hyperglycemia, unspecified: Secondary | ICD-10-CM

## 2022-03-30 DIAGNOSIS — Z6837 Body mass index (BMI) 37.0-37.9, adult: Secondary | ICD-10-CM

## 2022-03-30 DIAGNOSIS — I10 Essential (primary) hypertension: Secondary | ICD-10-CM

## 2022-03-30 DIAGNOSIS — E782 Mixed hyperlipidemia: Secondary | ICD-10-CM | POA: Diagnosis not present

## 2022-03-30 NOTE — Progress Notes (Signed)
Provider:  Alain Honey, MD  Careteam: Patient Care Team: Wardell Honour, MD as PCP - General (Family Medicine) Troy Sine, MD as PCP - Cardiology (Cardiology)  PLACE OF SERVICE:  [place]  Advanced Directive information    Allergies  Allergen Reactions   Lipitor [Atorvastatin] Other (See Comments)    Leg pain/cramps; statin myopathy   Red Yeast Rice [Cholestin] Other (See Comments)    Makes legs hurt, myopathy   Statins Other (See Comments)    "muscle break down", statin myopathy   Statins Support [Acid Blockers Support] Other (See Comments)   Codeine Other (See Comments)    Headache   Latex Rash    Chief Complaint  Patient presents with   Medical Management of Chronic Issues    Patient presents today for a 7 month follow-up.   Quality Metric Gaps    Zoster, COVID booster #5     HPI: Patient is a 71 y.o. male patient presents today for routine follow-up of lipids.  He continues to struggle with weight.  He has noted some shortness of breath recently.  Denies orthopnea. Followed by cardiology no new recommendations or treatments there.  He is taking spironolactone primarily for edema and occasional furosemide.  Blood pressure is managed with ACE inhibitor calcium channel blocker and beta-blocker.  Review of Systems:  Review of Systems  Constitutional: Negative.   HENT: Negative.    Eyes: Negative.   Respiratory:  Positive for shortness of breath.   Cardiovascular:  Positive for leg swelling.  Genitourinary:  Positive for frequency.  Musculoskeletal:  Positive for joint pain.  Skin: Negative.   Neurological: Negative.   Psychiatric/Behavioral: Negative.    All other systems reviewed and are negative.   Past Medical History:  Diagnosis Date   Benign neoplasm of colon    Benign neoplasm of skin of upper limb, including shoulder    Cancer of skin of external cheek    Carpal tunnel syndrome    Chest pain    a. normal cors by cath in 2015    Diaphragmatic hernia without mention of obstruction or gangrene    Dyslipidemia    Elevated prostate specific antigen (PSA)    GERD (gastroesophageal reflux disease)    Herpes zoster without mention of complication    History of cancer of external ear    History of hiatal hernia    Hypertension    Hypertrophy of prostate with urinary obstruction and other lower urinary tract symptoms (LUTS)    Impotence of organic origin    Lumbago    Obesity, unspecified    Osteoarthrosis, unspecified whether generalized or localized, lower leg    Other and unspecified hyperlipidemia    Other malaise and fatigue    Other seborrheic keratosis    Special screening for malignant neoplasm of prostate    Stenosis, cervical spine    Unspecified tinnitus    Past Surgical History:  Procedure Laterality Date   ANTERIOR CERVICAL DECOMP/DISCECTOMY FUSION N/A 02/19/2018   Procedure: ANTERIOR CERVICAL DECOMPRESSION/DISCECTOMY FUSION - CERVICAL FOUR-CERVICAL FIVE - CERVICAL FIVE-CERVICAL SIX;  Surgeon: Earnie Larsson, MD;  Location: [facility];  Service: Neurosurgery;  Laterality: N/A;   CARDIOVASCULAR STRESS TEST  06/15/2012   Non-diagnostic for ischemia. No lexiscan EKG changes.   CHOLECYSTECTOMY  1992   EYE SURGERY     eyeband  2006   removal of right eyeband   INGUINAL HERNIA REPAIR  1984   left   INTRAOCULAR LENS REMOVAL  2008  right   LEFT HEART CATHETERIZATION WITH CORONARY ANGIOGRAM N/A 09/05/2013   Procedure: LEFT HEART CATHETERIZATION WITH CORONARY ANGIOGRAM;  Surgeon: Troy Sine, MD;  Location: [facility] CATH LAB;  Service: Cardiovascular;  Laterality: N/A;   LOWER VENOUS EXTREMITY DOPPLER  01/26/2011   No evidence of thrombus or thrombophlebitis.   RETINAL DETACHMENT SURGERY  2001   right   TOTAL HIP ARTHROPLASTY  2012   bilateral   TOTAL KNEE ARTHROPLASTY  2011, 2012   bilateral   TRANSTHORACIC ECHOCARDIOGRAM  10/05/2009   EF >55%, normal LV size, systolic, and diastolic function.   Social History:    reports that he has never smoked. He has never used smokeless tobacco. He reports that he does not drink alcohol and does not use drugs.  Family History  Problem Relation Age of Onset   Diabetes Mother    Arrhythmia Mother    Hypertension Mother    Dementia Father    Pulmonary embolism Father    Hypertension Father    Cancer Sister        Liver cancer - Histocytic lymphoma   Cancer Sister 76       Skin cancer   Hypertension Sister 31   Diabetes Sister    Stroke Brother    Diabetes Brother    Hypertension Brother    Cancer Daughter    Cancer Maternal Grandmother        Skin cancer   Heart attack Maternal Grandfather    Cancer Paternal Grandfather    Hypertension Paternal Grandfather     Medications: Patient's Medications  New Prescriptions   No medications on file  Previous Medications   AMLODIPINE (NORVASC) 10 MG TABLET    TAKE 1 TABLET BY MOUTH EVERY DAY IN THE MORNING   ASPIRIN EC 81 MG TABLET    Take 81 mg by mouth every morning.   BENAZEPRIL (LOTENSIN) 40 MG TABLET    TAKE 1 TABLET BY MOUTH EVERY DAY   BRIMONIDINE (ALPHAGAN P) 0.1 % SOLN       CARVEDILOL (COREG) 6.25 MG TABLET    Take 1 tablet (6.25 mg total) by mouth 2 (two) times daily.   DORZOLAMIDE-TIMOLOL (COSOPT) 22.3-6.8 MG/ML OPHTHALMIC SOLUTION    Place 2 drops into the right eye 2 (two) times daily.    ESOMEPRAZOLE (NEXIUM) 40 MG CAPSULE    TAKE 1 CAPSULE BY MOUTH AT BEDTIME. FOR REFLUX   EZETIMIBE (ZETIA) 10 MG TABLET    TAKE 1 TABLET BY MOUTH EVERY DAY   FINASTERIDE (PROSCAR) 5 MG TABLET    TAKE 1 TABLET BY MOUTH EVERY DAY IN THE MORNING   FUROSEMIDE (LASIX) 40 MG TABLET    Take 1 tablet (40 mg total) by mouth daily. Take an additional 20 mg (0.5 tablet) for weight again. (3 pounds overnight or 5 pounds in 1 week.)   OMEGA-3 FATTY ACIDS (FISH OIL PO)    Take 1,400 mg by mouth.   POLYVINYL ALCOHOL-POVIDONE (REFRESH OP)    Place 1 drop into the right eye daily as needed (dry eyes/irritation).   SPIRONOLACTONE  (ALDACTONE) 25 MG TABLET    TAKE 1 TABLET (25 MG TOTAL) BY MOUTH DAILY.  Modified Medications   No medications on file  Discontinued Medications   AMOXICILLIN-CLAVULANATE (AUGMENTIN) 875-125 MG TABLET    Take 1 tablet by mouth every 12 (twelve) hours.    Physical Exam:  Vitals:   03/30/22 0822  BP: 128/88  Pulse: 74  Temp: 98.4 F (36.9 C)  SpO2: 97%  Weight: 288 lb (130.6 kg)  Height: '5\' 11"'$  (1.803 m)   Body mass index is 40.17 kg/m. Wt Readings from Last 3 Encounters:  03/30/22 288 lb (130.6 kg)  02/08/22 285 lb (129.3 kg)  11/02/21 278 lb (126.1 kg)    Physical Exam Vitals and nursing note reviewed.  Constitutional:      Appearance: He is obese.  Eyes:     Extraocular Movements: Extraocular movements intact.     Pupils: Pupils are equal, round, and reactive to light.  Cardiovascular:     Rate and Rhythm: Normal rate and regular rhythm.     Heart sounds: Normal heart sounds.  Pulmonary:     Effort: Pulmonary effort is normal.     Breath sounds: Normal breath sounds.  Abdominal:     General: Abdomen is flat. Bowel sounds are normal.  Neurological:     General: No focal deficit present.     Mental Status: He is alert and oriented to person, place, and time.     Labs reviewed: Basic Metabolic Panel: Recent Labs    06/22/21 1556 09/01/21 0949  NA 142 141  K 4.8 4.1  CL 106 106  CO2 24 29  GLUCOSE 84 96  BUN 23 18  CREATININE 1.17 0.94  CALCIUM 9.3 9.3  MG 2.1  --    Liver Function Tests: Recent Labs    09/01/21 0949  AST 18  ALT 21  BILITOT 0.5  PROT 5.7*   No results for input(s): "LIPASE", "AMYLASE" in the last 8760 hours. No results for input(s): "AMMONIA" in the last 8760 hours. CBC: No results for input(s): "WBC", "NEUTROABS", "HGB", "HCT", "MCV", "PLT" in the last 8760 hours. Lipid Panel: Recent Labs    09/01/21 0949  CHOL 175  HDL 39*  LDLCALC 104*  TRIG 198*  CHOLHDL 4.5   TSH: No results for input(s): "TSH" in the last  8760 hours. A1C: Lab Results  Component Value Date   HGBA1C 5.4 03/01/2021     Assessment/Plan  1. Mixed hyperlipidemia Since patient is statin intolerant he is treated only with Zetia and at last check LDL was 104  2. Primary hypertension Blood pressure generally well-controlled today is up a little bit.  Plan to continue his same regimen  3. Hyperglycemia Will keep check on A1c.  Given his body habitus he is at risk for development of diabetes  4. BMI 37.0-37.9, adult Again discussed importance of weight control as it relates especially to his joint pains shortness of breath and possible development of diabetes.  We did exercise him walking up and down the hall today as he is complaining of some dyspnea but oxygen saturation did not change despite the walk.   Alain Honey, MD [facility] [phone]

## 2022-03-31 LAB — LIPID PANEL
Cholesterol: 167 mg/dL (ref <200)
HDL: 46 mg/dL (ref >=40)
LDL Cholesterol (Calc): 97 mg/dL (calc)
Non-HDL Cholesterol (Calc): 121 mg/dL (calc) (ref <130)
Total CHOL/HDL Ratio: 3.6 (calc) (ref <5.0)
Triglycerides: 147 mg/dL (ref <150)

## 2022-05-05 ENCOUNTER — Other Ambulatory Visit: Payer: Self-pay | Admitting: Medical

## 2022-05-06 ENCOUNTER — Other Ambulatory Visit: Payer: Self-pay

## 2022-05-06 DIAGNOSIS — E7849 Other hyperlipidemia: Secondary | ICD-10-CM

## 2022-05-06 MED ORDER — EZETIMIBE 10 MG PO TABS: 10.0000 mg | ORAL_TABLET | Freq: Every day | ORAL | 2 refills | Status: DC

## 2022-05-27 ENCOUNTER — Ambulatory Visit (INDEPENDENT_AMBULATORY_CARE_PROVIDER_SITE_OTHER): Payer: PPO | Admitting: Adult Health

## 2022-05-27 ENCOUNTER — Encounter: Payer: Self-pay | Admitting: Adult Health

## 2022-05-27 VITALS — BP 132/80 | HR 81 | Temp 97.5°F | Resp 18 | Ht 71.0 in | Wt 298.0 lb

## 2022-05-27 DIAGNOSIS — J019 Acute sinusitis, unspecified: Secondary | ICD-10-CM | POA: Diagnosis not present

## 2022-05-27 DIAGNOSIS — B9689 Other specified bacterial agents as the cause of diseases classified elsewhere: Secondary | ICD-10-CM

## 2022-05-27 DIAGNOSIS — Z23 Encounter for immunization: Secondary | ICD-10-CM

## 2022-05-27 MED ORDER — SACCHAROMYCES BOULARDII 250 MG PO CAPS: 250.0000 mg | ORAL_CAPSULE | Freq: Two times a day (BID) | ORAL | 0 refills | Status: AC

## 2022-05-27 MED ORDER — AMOXICILLIN-POT CLAVULANATE 875-125 MG PO TABS: 1.0000 | ORAL_TABLET | Freq: Two times a day (BID) | ORAL | 0 refills | Status: DC

## 2022-05-27 MED ORDER — ALBUTEROL SULFATE HFA 108 (90 BASE) MCG/ACT IN AERS: 2.0000 | INHALATION_SPRAY | Freq: Four times a day (QID) | RESPIRATORY_TRACT | 0 refills | Status: DC | PRN

## 2022-05-27 NOTE — Patient Instructions (Signed)

## 2022-05-27 NOTE — Progress Notes (Signed)
[facility] clinic  Provider:  Durenda Age DNP  Code Status:   Full Code  Goals of Care:     08/24/2021    8:23 AM  Advanced Directives  Does Patient Have a Medical Advance Directive? Yes  Type of Advance Directive Living will;Out of facility DNR (pink MOST or yellow form)  Pre-existing out of facility DNR order (yellow form or pink MOST form) Pink MOST form placed in chart (order not valid for inpatient use)     Chief Complaint  Patient presents with   Acute Visit    Patient is being seen for head congestion, patient would also like flu vaccine    HPI: Patient is a 71 y.o. male seen today for an acute visit for head congestion and wanting flu vaccine. He has PMH of ofCAD, chronic diastolic CHF, hypertension, hyperlipidemia, BPH, depression and morbid obesity. He complained of productive cough with yellowish to whitish phlegm X 3 days. She denies having fever nor chills. He has slight shortness of breath when lying down. No sick exposure that he is aware of. He had 3 COVID-19 vaccines. He tested negative for COVID-19 test at home.  Past Medical History:  Diagnosis Date   Benign neoplasm of colon    Benign neoplasm of skin of upper limb, including shoulder    Cancer of skin of external cheek    Carpal tunnel syndrome    Chest pain    a. normal cors by cath in 2015   Diaphragmatic hernia without mention of obstruction or gangrene    Dyslipidemia    Elevated prostate specific antigen (PSA)    GERD (gastroesophageal reflux disease)    Herpes zoster without mention of complication    History of cancer of external ear    History of hiatal hernia    Hypertension    Hypertrophy of prostate with urinary obstruction and other lower urinary tract symptoms (LUTS)    Impotence of organic origin    Lumbago    Obesity, unspecified    Osteoarthrosis, unspecified whether generalized or localized, lower leg    Other and unspecified hyperlipidemia    Other malaise and fatigue    Other  seborrheic keratosis    Special screening for malignant neoplasm of prostate    Stenosis, cervical spine    Unspecified tinnitus     Past Surgical History:  Procedure Laterality Date   ANTERIOR CERVICAL DECOMP/DISCECTOMY FUSION N/A 02/19/2018   Procedure: ANTERIOR CERVICAL DECOMPRESSION/DISCECTOMY FUSION - CERVICAL FOUR-CERVICAL FIVE - CERVICAL FIVE-CERVICAL SIX;  Surgeon: Earnie Larsson, MD;  Location: [facility];  Service: Neurosurgery;  Laterality: N/A;   CARDIOVASCULAR STRESS TEST  06/15/2012   Non-diagnostic for ischemia. No lexiscan EKG changes.   CHOLECYSTECTOMY  1992   EYE SURGERY     eyeband  2006   removal of right eyeband   INGUINAL HERNIA REPAIR  1984   left   INTRAOCULAR LENS REMOVAL  2008   right   LEFT HEART CATHETERIZATION WITH CORONARY ANGIOGRAM N/A 09/05/2013   Procedure: LEFT HEART CATHETERIZATION WITH CORONARY ANGIOGRAM;  Surgeon: Troy Sine, MD;  Location: [facility] CATH LAB;  Service: Cardiovascular;  Laterality: N/A;   LOWER VENOUS EXTREMITY DOPPLER  01/26/2011   No evidence of thrombus or thrombophlebitis.   RETINAL DETACHMENT SURGERY  2001   right   TOTAL HIP ARTHROPLASTY  2012   bilateral   TOTAL KNEE ARTHROPLASTY  2011, 2012   bilateral   TRANSTHORACIC ECHOCARDIOGRAM  10/05/2009   EF >55%, normal LV size,  systolic, and diastolic function.    Allergies  Allergen Reactions   Lipitor [Atorvastatin] Other (See Comments)    Leg pain/cramps; statin myopathy   Red Yeast Rice [Cholestin] Other (See Comments)    Makes legs hurt, myopathy   Statins Other (See Comments)    "muscle break down", statin myopathy   Statins Support [Acid Blockers Support] Other (See Comments)   Codeine Other (See Comments)    Headache   Latex Rash    Outpatient Encounter Medications as of 05/27/2022  Medication Sig   amLODipine (NORVASC) 10 MG tablet TAKE 1 TABLET BY MOUTH EVERY DAY IN THE MORNING   aspirin EC 81 MG tablet Take 81 mg by mouth every morning.   benazepril (LOTENSIN) 40 MG  tablet TAKE 1 TABLET BY MOUTH EVERY DAY   brimonidine (ALPHAGAN P) 0.1 % SOLN    carvedilol (COREG) 6.25 MG tablet Take 1 tablet (6.25 mg total) by mouth 2 (two) times daily.   dorzolamide-timolol (COSOPT) 22.3-6.8 MG/ML ophthalmic solution Place 2 drops into the right eye 2 (two) times daily.    esomeprazole (NEXIUM) 40 MG capsule TAKE 1 CAPSULE BY MOUTH AT BEDTIME. FOR REFLUX   ezetimibe (ZETIA) 10 MG tablet Take 1 tablet (10 mg total) by mouth daily.   finasteride (PROSCAR) 5 MG tablet TAKE 1 TABLET BY MOUTH EVERY DAY IN THE MORNING   furosemide (LASIX) 40 MG tablet Take 1 tablet (40 mg total) by mouth daily. Take an additional 20 mg (0.5 tablet) for weight again. (3 pounds overnight or 5 pounds in 1 week.)   Omega-3 Fatty Acids (FISH OIL PO) Take 1,400 mg by mouth.   Polyvinyl Alcohol-Povidone (REFRESH OP) Place 1 drop into the right eye daily as needed (dry eyes/irritation).   spironolactone (ALDACTONE) 25 MG tablet TAKE 1 TABLET (25 MG TOTAL) BY MOUTH DAILY.   No facility-administered encounter medications on file as of 05/27/2022.    Review of Systems:  Review of Systems  Constitutional:  Negative for activity change, appetite change and fever.  HENT:  Positive for congestion. Negative for sore throat.   Eyes: Negative.   Respiratory:  Positive for cough and shortness of breath.   Cardiovascular:  Negative for chest pain and leg swelling.  Gastrointestinal:  Negative for abdominal distention, diarrhea and vomiting.  Genitourinary:  Negative for dysuria, frequency and urgency.  Skin:  Negative for color change.  Neurological:  Negative for dizziness and headaches.  Psychiatric/Behavioral:  Negative for behavioral problems and sleep disturbance. The patient is not nervous/anxious.     Health Maintenance  Topic Date Due   Zoster Vaccines- Shingrix (1 of 2) Never done   COVID-19 Vaccine (5 - Pfizer risk series) 08/10/2021   INFLUENZA VACCINE  03/22/2022   COLONOSCOPY (Pts 45-24yr  Insurance coverage will need to be confirmed)  03/09/2031   TETANUS/TDAP  09/02/2031   Pneumonia Vaccine 71 Years old  Completed   HPV VACCINES  Aged Out   Hepatitis C Screening  Discontinued    Physical Exam: Vitals:   05/27/22 1003  BP: 132/80  Pulse: 81  Resp: 18  Temp: (!) 97.5 F (36.4 C)  TempSrc: Temporal  SpO2: 95%  Weight: 298 lb (135.2 kg)  Height: '5\' 11"'$  (1.803 m)   Body mass index is 41.56 kg/m. Physical Exam Constitutional:      General: He is not in acute distress.    Appearance: He is obese.  HENT:     Head: Normocephalic and atraumatic.  Mouth/Throat:     Mouth: Mucous membranes are moist.  Eyes:     Conjunctiva/sclera: Conjunctivae normal.  Cardiovascular:     Rate and Rhythm: Normal rate and regular rhythm.     Pulses: Normal pulses.     Heart sounds: Normal heart sounds.  Pulmonary:     Effort: Pulmonary effort is normal.     Breath sounds: Normal breath sounds.  Abdominal:     General: Bowel sounds are normal.     Palpations: Abdomen is soft.  Musculoskeletal:        General: No swelling. Normal range of motion.     Cervical back: Normal range of motion.  Skin:    General: Skin is warm and dry.  Neurological:     General: No focal deficit present.     Mental Status: He is alert and oriented to person, place, and time.  Psychiatric:        Mood and Affect: Mood normal.        Behavior: Behavior normal.        Thought Content: Thought content normal.        Judgment: Judgment normal.     Labs reviewed: Basic Metabolic Panel: Recent Labs    06/22/21 1556 09/01/21 0949  NA 142 141  K 4.8 4.1  CL 106 106  CO2 24 29  GLUCOSE 84 96  BUN 23 18  CREATININE 1.17 0.94  CALCIUM 9.3 9.3  MG 2.1  --    Liver Function Tests: Recent Labs    09/01/21 0949  AST 18  ALT 21  BILITOT 0.5  PROT 5.7*   No results for input(s): "LIPASE", "AMYLASE" in the last 8760 hours. No results for input(s): "AMMONIA" in the last 8760  hours. CBC: No results for input(s): "WBC", "NEUTROABS", "HGB", "HCT", "MCV", "PLT" in the last 8760 hours. Lipid Panel: Recent Labs    09/01/21 0949 03/30/22 0858  CHOL 175 167  HDL 39* 46  LDLCALC 104* 97  TRIG 198* 147  CHOLHDL 4.5 3.6   Lab Results  Component Value Date   HGBA1C 5.4 03/01/2021    Procedures since last visit: No results found.  Assessment/Plan  1. Acute bacterial rhinosinusitis - amoxicillin-clavulanate (AUGMENTIN) 875-125 MG tablet; Take 1 tablet by mouth 2 (two) times daily for 10 days.  Dispense: 20 tablet; Refill: 0 - saccharomyces boulardii (FLORASTOR) 250 MG capsule; Take 1 capsule (250 mg total) by mouth 2 (two) times daily for 13 days.  Dispense: 26 capsule; Refill: 0 - albuterol (VENTOLIN HFA) 108 (90 Base) MCG/ACT inhaler; Inhale 2 puffs into the lungs every 6 (six) hours as needed for wheezing or shortness of breath.  Dispense: 8 g; Refill: 0  2. Flu vaccine need - Flu Vaccine QUAD High Dose(Fluad)    Labs/tests ordered:   None   Next appt:  09/01/2022

## 2022-06-02 ENCOUNTER — Encounter: Payer: Self-pay | Admitting: Adult Health

## 2022-06-02 ENCOUNTER — Ambulatory Visit
Admission: RE | Admit: 2022-06-02 | Discharge: 2022-06-02 | Disposition: A | Discharge status: Home / Self Care | Payer: PPO | Source: Ambulatory Visit | Attending: Adult Health | Admitting: Adult Health

## 2022-06-02 ENCOUNTER — Ambulatory Visit (INDEPENDENT_AMBULATORY_CARE_PROVIDER_SITE_OTHER): Payer: PPO | Admitting: Adult Health

## 2022-06-02 VITALS — BP 136/64 | HR 74 | Temp 98.0°F | Resp 16 | Ht 71.0 in | Wt 296.0 lb

## 2022-06-02 DIAGNOSIS — J209 Acute bronchitis, unspecified: Secondary | ICD-10-CM | POA: Diagnosis not present

## 2022-06-02 DIAGNOSIS — I10 Essential (primary) hypertension: Secondary | ICD-10-CM | POA: Diagnosis not present

## 2022-06-02 DIAGNOSIS — Z6841 Body Mass Index (BMI) 40.0 and over, adult: Secondary | ICD-10-CM | POA: Diagnosis not present

## 2022-06-02 DIAGNOSIS — I5032 Chronic diastolic (congestive) heart failure: Secondary | ICD-10-CM

## 2022-06-02 MED ORDER — BENZONATATE 100 MG PO CAPS: 100.0000 mg | ORAL_CAPSULE | Freq: Three times a day (TID) | ORAL | 0 refills | Status: DC | PRN

## 2022-06-02 MED ORDER — DOXYCYCLINE HYCLATE 100 MG PO TABS: 100.0000 mg | ORAL_TABLET | Freq: Two times a day (BID) | ORAL | 0 refills | Status: AC

## 2022-06-02 NOTE — Progress Notes (Signed)
Chest x-ray was negative for pneumonia. No need for the Doxycycline. -  Continue taking Furosemide and aldactone -  continue taking Tessalon Perles for cough. -  will let you know regarding CBC and BMP result

## 2022-06-02 NOTE — Progress Notes (Signed)
[facility] clinic  Provider: Durenda Age DNP  Code Status:  Full Code  Goals of Care:     06/02/2022    1:40 PM  Advanced Directives  Does Patient Have a Medical Advance Directive? No  Would patient like information on creating a medical advance directive? No - Patient declined     Chief Complaint  Patient presents with   Acute Visit    Cough still the same as last week and have to sit up because of coughing and SOB   Immunizations    Discussed the need for shingles and covid vaccine     HPI: Patient is a 71 y.o. male seen today for an acute visit for continued coughing with clear to yellowish secretions. He stated that he cannot go fishing due to his coughing which causes him to have shortness of breath. O2 sat on room air in the office is 96%. He was accompanied by wife today. He denies fever, chills nor body aches.  He was seen at [facility] on 05/27/22 and was diagnosed with acute bacterial rhinosinusitis for which he was started on Augmentin.   Past Medical History:  Diagnosis Date   Benign neoplasm of colon    Benign neoplasm of skin of upper limb, including shoulder    Cancer of skin of external cheek    Carpal tunnel syndrome    Chest pain    a. normal cors by cath in 2015   Diaphragmatic hernia without mention of obstruction or gangrene    Dyslipidemia    Elevated prostate specific antigen (PSA)    GERD (gastroesophageal reflux disease)    Herpes zoster without mention of complication    History of cancer of external ear    History of hiatal hernia    Hypertension    Hypertrophy of prostate with urinary obstruction and other lower urinary tract symptoms (LUTS)    Impotence of organic origin    Lumbago    Obesity, unspecified    Osteoarthrosis, unspecified whether generalized or localized, lower leg    Other and unspecified hyperlipidemia    Other malaise and fatigue    Other seborrheic keratosis    Special screening for malignant neoplasm of prostate     Stenosis, cervical spine    Unspecified tinnitus     Past Surgical History:  Procedure Laterality Date   ANTERIOR CERVICAL DECOMP/DISCECTOMY FUSION N/A 02/19/2018   Procedure: ANTERIOR CERVICAL DECOMPRESSION/DISCECTOMY FUSION - CERVICAL FOUR-CERVICAL FIVE - CERVICAL FIVE-CERVICAL SIX;  Surgeon: Earnie Larsson, MD;  Location: [place];  Service: Neurosurgery;  Laterality: N/A;   CARDIOVASCULAR STRESS TEST  06/15/2012   Non-diagnostic for ischemia. No lexiscan EKG changes.   CHOLECYSTECTOMY  1992   EYE SURGERY     eyeband  2006   removal of right eyeband   INGUINAL HERNIA REPAIR  1984   left   INTRAOCULAR LENS REMOVAL  2008   right   LEFT HEART CATHETERIZATION WITH CORONARY ANGIOGRAM N/A 09/05/2013   Procedure: LEFT HEART CATHETERIZATION WITH CORONARY ANGIOGRAM;  Surgeon: Troy Sine, MD;  Location: [facility] CATH LAB;  Service: Cardiovascular;  Laterality: N/A;   LOWER VENOUS EXTREMITY DOPPLER  01/26/2011   No evidence of thrombus or thrombophlebitis.   RETINAL DETACHMENT SURGERY  2001   right   TOTAL HIP ARTHROPLASTY  2012   bilateral   TOTAL KNEE ARTHROPLASTY  2011, 2012   bilateral   TRANSTHORACIC ECHOCARDIOGRAM  10/05/2009   EF >55%, normal LV size, systolic, and diastolic function.  Allergies  Allergen Reactions   Lipitor [Atorvastatin] Other (See Comments)    Leg pain/cramps; statin myopathy   Red Yeast Rice [Cholestin] Other (See Comments)    Makes legs hurt, myopathy   Statins Other (See Comments)    "muscle break down", statin myopathy   Statins Support [Acid Blockers Support] Other (See Comments)   Codeine Other (See Comments)    Headache   Latex Rash    Outpatient Encounter Medications as of 06/02/2022  Medication Sig   albuterol (VENTOLIN HFA) 108 (90 Base) MCG/ACT inhaler Inhale 2 puffs into the lungs every 6 (six) hours as needed for wheezing or shortness of breath.   amLODipine (NORVASC) 10 MG tablet TAKE 1 TABLET BY MOUTH EVERY DAY IN THE MORNING    amoxicillin-clavulanate (AUGMENTIN) 875-125 MG tablet Take 1 tablet by mouth 2 (two) times daily for 10 days.   aspirin EC 81 MG tablet Take 81 mg by mouth every morning.   benazepril (LOTENSIN) 40 MG tablet TAKE 1 TABLET BY MOUTH EVERY DAY   brimonidine (ALPHAGAN P) 0.1 % SOLN    carvedilol (COREG) 6.25 MG tablet Take 1 tablet (6.25 mg total) by mouth 2 (two) times daily.   dorzolamide-timolol (COSOPT) 22.3-6.8 MG/ML ophthalmic solution Place 2 drops into the right eye 2 (two) times daily.    esomeprazole (NEXIUM) 40 MG capsule TAKE 1 CAPSULE BY MOUTH AT BEDTIME. FOR REFLUX   ezetimibe (ZETIA) 10 MG tablet Take 1 tablet (10 mg total) by mouth daily.   finasteride (PROSCAR) 5 MG tablet TAKE 1 TABLET BY MOUTH EVERY DAY IN THE MORNING   furosemide (LASIX) 40 MG tablet Take 1 tablet (40 mg total) by mouth daily. Take an additional 20 mg (0.5 tablet) for weight again. (3 pounds overnight or 5 pounds in 1 week.)   Omega-3 Fatty Acids (FISH OIL PO) Take 1,400 mg by mouth.   Polyvinyl Alcohol-Povidone (REFRESH OP) Place 1 drop into the right eye daily as needed (dry eyes/irritation).   saccharomyces boulardii (FLORASTOR) 250 MG capsule Take 1 capsule (250 mg total) by mouth 2 (two) times daily for 13 days.   spironolactone (ALDACTONE) 25 MG tablet TAKE 1 TABLET (25 MG TOTAL) BY MOUTH DAILY.   No facility-administered encounter medications on file as of 06/02/2022.    Review of Systems:  Review of Systems  Constitutional:  Negative for activity change, appetite change and fever.  HENT:  Negative for sore throat.   Eyes: Negative.   Respiratory:  Positive for shortness of breath.   Cardiovascular:  Negative for chest pain and leg swelling.  Gastrointestinal:  Negative for abdominal distention, diarrhea and vomiting.  Genitourinary:  Negative for dysuria, frequency and urgency.  Skin:  Negative for color change.  Neurological:  Negative for dizziness and headaches.  Psychiatric/Behavioral:   Negative for behavioral problems and sleep disturbance. The patient is not nervous/anxious.     Health Maintenance  Topic Date Due   Zoster Vaccines- Shingrix (1 of 2) Never done   COVID-19 Vaccine (5 - Pfizer risk series) 08/10/2021   COLONOSCOPY (Pts 45-56yr Insurance coverage will need to be confirmed)  03/09/2031   TETANUS/TDAP  09/02/2031   Pneumonia Vaccine 71 Years old  Completed   INFLUENZA VACCINE  Completed   HPV VACCINES  Aged Out   Hepatitis C Screening  Discontinued    Physical Exam: Vitals:   06/02/22 1341  BP: 136/64  Pulse: 74  Resp: 16  Temp: 98 F (36.7 C)  TempSrc: Temporal  SpO2:  96%  Weight: 296 lb (134.3 kg)  Height: '5\' 11"'  (1.803 m)   Body mass index is 41.28 kg/m. Physical Exam Constitutional:      General: He is not in acute distress.    Appearance: He is obese.     Comments: Morbidly obese  HENT:     Head: Normocephalic and atraumatic.     Mouth/Throat:     Mouth: Mucous membranes are moist.  Eyes:     Conjunctiva/sclera: Conjunctivae normal.  Cardiovascular:     Rate and Rhythm: Normal rate and regular rhythm.     Pulses: Normal pulses.     Heart sounds: Normal heart sounds.  Pulmonary:     Effort: Pulmonary effort is normal. No respiratory distress.     Breath sounds: Wheezing present. No rhonchi.     Comments: Slight wheezing on bilateral lower lung fields. Chest:     Chest wall: No tenderness.  Abdominal:     General: Bowel sounds are normal.     Palpations: Abdomen is soft.  Musculoskeletal:        General: No swelling. Normal range of motion.     Cervical back: Normal range of motion.  Skin:    General: Skin is warm and dry.  Neurological:     General: No focal deficit present.     Mental Status: He is alert and oriented to person, place, and time.  Psychiatric:        Mood and Affect: Mood normal.        Behavior: Behavior normal.        Thought Content: Thought content normal.        Judgment: Judgment normal.      Labs reviewed: Basic Metabolic Panel: Recent Labs    06/22/21 1556 09/01/21 0949  NA 142 141  K 4.8 4.1  CL 106 106  CO2 24 29  GLUCOSE 84 96  BUN 23 18  CREATININE 1.17 0.94  CALCIUM 9.3 9.3  MG 2.1  --    Liver Function Tests: Recent Labs    09/01/21 0949  AST 18  ALT 21  BILITOT 0.5  PROT 5.7*   No results for input(s): "LIPASE", "AMYLASE" in the last 8760 hours. No results for input(s): "AMMONIA" in the last 8760 hours. CBC: No results for input(s): "WBC", "NEUTROABS", "HGB", "HCT", "MCV", "PLT" in the last 8760 hours. Lipid Panel: Recent Labs    09/01/21 0949 03/30/22 0858  CHOL 175 167  HDL 39* 46  LDLCALC 104* 97  TRIG 198* 147  CHOLHDL 4.5 3.6   Lab Results  Component Value Date   HGBA1C 5.4 03/01/2021    Procedures since last visit: No results found.  Assessment/Plan  1. Acute bronchitis, unspecified organism -  will start on Doxycycline and Tessalon Perles - DG Chest 2 View - benzonatate (TESSALON PERLES) 100 MG capsule; Take 1 capsule (100 mg total) by mouth 3 (three) times daily as needed for cough.  Dispense: 30 capsule; Refill: 0 - doxycycline (VIBRA-TABS) 100 MG tablet; Take 1 tablet (100 mg total) by mouth 2 (two) times daily for 10 days.  Dispense: 20 tablet; Refill: 0 - CBC With Differential/Platelet - BMP with eGFR([facility])  2. Primary hypertension -  BP 136/64, continue Lotensin and Coreg  3. Morbid obesity ([place]) BMI 40.0-44.9, adult ([place]) -   encouraged to do regular exercise and healthy dietary choices  5. Chronic diastolic (congestive) heart failure (HCC) -  continue Lasix and Aldactone     Labs/tests ordered:  CBC and BMP  Next appt:  09/01/2022

## 2022-06-02 NOTE — Patient Instructions (Signed)
  Acute Bronchitis, Adult  Acute bronchitis is when air tubes in the lungs (bronchi) suddenly get swollen. The condition can make it hard for you to breathe. In adults, acute bronchitis usually goes away within 2 weeks. A cough caused by bronchitis may last up to 3 weeks. Smoking, allergies, and asthma can make the condition worse. What are the causes? Germs that cause cold and flu (viruses). The most common cause of this condition is the virus that causes the common cold. Bacteria. Substances that bother (irritate) the lungs, including: Smoke from cigarettes and other types of tobacco. Dust and pollen. Fumes from chemicals, gases, or burned fuel. Indoor or outdoor air pollution. What increases the risk? A weak body's defense system. This is also called the immune system. Any condition that affects your lungs and breathing, such as asthma. What are the signs or symptoms? A cough. Coughing up clear, yellow, or green mucus. Making high-pitched whistling sounds when you breathe, most often when you breathe out (wheezing). Runny or stuffy nose. Having too much mucus in your lungs (chest congestion). Shortness of breath. Body aches. A sore throat. How is this treated? Acute bronchitis may go away over time without treatment. Your doctor may tell you to: Drink more fluids. This will help thin your mucus so it is easier to cough up. Use a device that gets medicine into your lungs (inhaler). Use a vaporizer or a humidifier. These are machines that add water to the air. This helps with coughing and poor breathing. Take a medicine that thins mucus and helps clear it from your lungs. Take a medicine that prevents or stops coughing. It is not common to take an antibiotic medicine for this condition. Follow these instructions at home:  Take over-the-counter and prescription medicines only as told by your doctor. Use an inhaler, vaporizer, or humidifier as told by your doctor. Take two  teaspoons (10 mL) of honey at bedtime. This helps lessen your coughing at night. Drink enough fluid to keep your pee (urine) pale yellow. Do not smoke or use any products that contain nicotine or tobacco. If you need help quitting, ask your doctor. Get a lot of rest. Return to your normal activities when your doctor says that it is safe. Keep all follow-up visits. How is this prevented?  Wash your hands often with soap and water for at least 20 seconds. If you cannot use soap and water, use hand sanitizer. Avoid contact with people who have cold symptoms. Try not to touch your mouth, nose, or eyes with your hands. Avoid breathing in smoke or chemical fumes. Make sure to get the flu shot every year. Contact a doctor if: Your symptoms do not get better in 2 weeks. You have trouble coughing up the mucus. Your cough keeps you awake at night. You have a fever. Get help right away if: You cough up blood. You have chest pain. You have very bad shortness of breath. You faint or keep feeling like you are going to faint. You have a very bad headache. Your fever or chills get worse. These symptoms may be an emergency. Get help right away. Call your local emergency services (911 in the [place] Do not wait to see if the symptoms will go away. Do not drive yourself to the hospital. Summary Acute bronchitis is when air tubes in the lungs (bronchi) suddenly get swollen. In adults, acute bronchitis usually goes away within 2 weeks. Drink more fluids. This will help thin your mucus so it   is easier to cough up. Take over-the-counter and prescription medicines only as told by your doctor. Contact a doctor if your symptoms do not improve after 2 weeks of treatment. This information is not intended to replace advice given to you by your health care provider. Make sure you discuss any questions you have with your health care provider. Document Revised: 12/09/2020 Document Reviewed: 12/09/2020 Elsevier  Patient Education  2023 Elsevier Inc.  

## 2022-06-03 LAB — CBC WITH DIFFERENTIAL/PLATELET
Absolute Monocytes: 941 cells/uL (ref 200–950)
Basophils Absolute: 107 cells/uL (ref 0–200)
Basophils Relative: 1.1 %
Eosinophils Absolute: 698 cells/uL — ABNORMAL HIGH (ref 15–500)
Eosinophils Relative: 7.2 %
HCT: 44 % (ref 38.5–50.0)
Hemoglobin: 15.2 g/dL (ref 13.2–17.1)
Lymphs Abs: 1940 cells/uL (ref 850–3900)
MCH: 30.9 pg (ref 27.0–33.0)
MCHC: 34.5 g/dL (ref 32.0–36.0)
MCV: 89.4 fL (ref 80.0–100.0)
MPV: 11 fL (ref 7.5–12.5)
Monocytes Relative: 9.7 %
Neutro Abs: 6014 cells/uL (ref 1500–7800)
Neutrophils Relative %: 62 %
Platelets: 205 10*3/uL (ref 140–400)
RBC: 4.92 10*6/uL (ref 4.20–5.80)
RDW: 13 % (ref 11.0–15.0)
Total Lymphocyte: 20 %
WBC: 9.7 10*3/uL (ref 3.8–10.8)

## 2022-06-03 LAB — BASIC METABOLIC PANEL WITH GFR
BUN: 14 mg/dL (ref 7–25)
CO2: 27 mmol/L (ref 20–32)
Calcium: 8.9 mg/dL (ref 8.6–10.3)
Chloride: 106 mmol/L (ref 98–110)
Creat: 1.03 mg/dL (ref 0.70–1.28)
Glucose, Bld: 86 mg/dL (ref 65–139)
Potassium: 3.9 mmol/L (ref 3.5–5.3)
Sodium: 140 mmol/L (ref 135–146)
eGFR: 77 mL/min/{1.73_m2} (ref >=60)

## 2022-06-03 NOTE — Progress Notes (Signed)
-    CBC and BMP are all within normal. Great! -  continue eating healthy meals and exercise regularly.

## 2022-06-14 ENCOUNTER — Other Ambulatory Visit: Payer: Self-pay | Admitting: Cardiovascular Disease

## 2022-06-14 ENCOUNTER — Other Ambulatory Visit: Payer: Self-pay | Admitting: Medical

## 2022-06-14 DIAGNOSIS — I10 Essential (primary) hypertension: Secondary | ICD-10-CM

## 2022-06-15 NOTE — Telephone Encounter (Signed)
This is Dr. Kelly's pt. °

## 2022-06-27 ENCOUNTER — Other Ambulatory Visit: Payer: Self-pay | Admitting: Adult Health

## 2022-06-27 DIAGNOSIS — J209 Acute bronchitis, unspecified: Secondary | ICD-10-CM

## 2022-06-27 NOTE — Telephone Encounter (Signed)
Patient has request refill on medication Benzonatate '100mg'$ . Patient medication last refilled 06/02/2022. Medication pend and sent to Sherrie Mustache, NP due to PCP Sabra Heck Lillette Boxer, MD being out of office.

## 2022-06-27 NOTE — Telephone Encounter (Signed)
Generally not an ongoing mediation

## 2022-07-11 ENCOUNTER — Ambulatory Visit (INDEPENDENT_AMBULATORY_CARE_PROVIDER_SITE_OTHER): Payer: PPO | Admitting: Internal Medicine

## 2022-07-11 ENCOUNTER — Encounter: Payer: Self-pay | Admitting: Internal Medicine

## 2022-07-11 VITALS — BP 118/68 | HR 72 | Temp 97.8°F | Ht 68.0 in | Wt 288.0 lb

## 2022-07-11 DIAGNOSIS — J209 Acute bronchitis, unspecified: Secondary | ICD-10-CM | POA: Diagnosis not present

## 2022-07-11 DIAGNOSIS — R1012 Left upper quadrant pain: Secondary | ICD-10-CM | POA: Diagnosis not present

## 2022-07-11 DIAGNOSIS — R059 Cough, unspecified: Secondary | ICD-10-CM | POA: Insufficient documentation

## 2022-07-11 DIAGNOSIS — K2100 Gastro-esophageal reflux disease with esophagitis, without bleeding: Secondary | ICD-10-CM | POA: Diagnosis not present

## 2022-07-11 DIAGNOSIS — I10 Essential (primary) hypertension: Secondary | ICD-10-CM

## 2022-07-11 DIAGNOSIS — R052 Subacute cough: Secondary | ICD-10-CM | POA: Diagnosis not present

## 2022-07-11 MED ORDER — BENZONATATE 100 MG PO CAPS: 100.0000 mg | ORAL_CAPSULE | Freq: Three times a day (TID) | ORAL | 1 refills | Status: DC | PRN

## 2022-07-11 NOTE — Assessment & Plan Note (Signed)
Doesn't seem to be intraabdominal---likely in abdminal wall Discussed heat or tylenol

## 2022-07-11 NOTE — Assessment & Plan Note (Signed)
Going back 3 weeks CXR negative No prior pulmonary history No allergies Reflux is controlled  Probably infectious but improving Will hold off on more antibiotics Refill the benzonatate If worsens--might consider prednisone

## 2022-07-11 NOTE — Progress Notes (Signed)
Subjective:    Patient ID: Anthony Bern., male    DOB: [birth year], 71 y.o.   MRN: [id number]  HPI Here to establish care Switching from [facility]---several left  Has been there several times with cough No fever Seems to be in his chest Goes back 6 weeks---significant SOB with activity or even talking Seen 10/12---CXR fine Got benzonatate and doxy. The cough med helped but not the antibiotic Still productive---light yellow (improved) SOB is some better No history of asthma  Never smoked--no occupational exposures  Does have acid reflux--this seems to be controlled on the Rx Had dilation a few months ago  Has had some LUQ cramps Seems to notice a lump there---especially when out blowing leaves Came back when he was in bed---and went into his groin Still bothers him a little now No regular gas problems---once in a while only  Current Outpatient Medications on File Prior to Visit  Medication Sig Dispense Refill   amLODipine (NORVASC) 10 MG tablet TAKE 1 TABLET BY MOUTH EVERY DAY IN THE MORNING 90 tablet 3   aspirin EC 81 MG tablet Take 81 mg by mouth every morning.     benazepril (LOTENSIN) 40 MG tablet TAKE 1 TABLET BY MOUTH EVERY DAY 90 tablet 3   brimonidine (ALPHAGAN P) 0.1 % SOLN      carvedilol (COREG) 6.25 MG tablet TAKE 1 TABLET BY MOUTH TWICE A DAY 180 tablet 3   dorzolamide-timolol (COSOPT) 22.3-6.8 MG/ML ophthalmic solution Place 2 drops into the right eye 2 (two) times daily.      esomeprazole (NEXIUM) 40 MG capsule TAKE 1 CAPSULE BY MOUTH AT BEDTIME. FOR REFLUX 90 capsule 1   ezetimibe (ZETIA) 10 MG tablet Take 1 tablet (10 mg total) by mouth daily. 90 tablet 2   finasteride (PROSCAR) 5 MG tablet TAKE 1 TABLET BY MOUTH EVERY DAY IN THE MORNING 90 tablet 1   furosemide (LASIX) 40 MG tablet Take 1 tablet (40 mg total) by mouth daily. Take an additional 20 mg (0.5 tablet) for weight again. (3 pounds overnight or 5 pounds in 1 week.) 105 tablet 3    Omega-3 Fatty Acids (FISH OIL PO) Take 1,400 mg by mouth.     Polyvinyl Alcohol-Povidone (REFRESH OP) Place 1 drop into the right eye daily as needed (dry eyes/irritation).     spironolactone (ALDACTONE) 25 MG tablet TAKE 1 TABLET (25 MG TOTAL) BY MOUTH DAILY. 90 tablet 3   benzonatate (TESSALON PERLES) 100 MG capsule Take 1 capsule (100 mg total) by mouth 3 (three) times daily as needed for cough. (Patient not taking: Reported on 07/11/2022) 30 capsule 0   No current facility-administered medications on file prior to visit.    Allergies  Allergen Reactions   Lipitor [Atorvastatin] Other (See Comments)    Leg pain/cramps; statin myopathy   Red Yeast Rice [Cholestin] Other (See Comments)    Makes legs hurt, myopathy   Statins Other (See Comments)    "muscle break down", statin myopathy   Statins Support [Acid Blockers Support] Other (See Comments)   Codeine Other (See Comments)    Headache   Latex Rash    Past Medical History:  Diagnosis Date   Benign neoplasm of colon    Benign neoplasm of skin of upper limb, including shoulder    Cancer of skin of external cheek    Carpal tunnel syndrome    Chest pain    a. normal cors by cath in 2015   Diaphragmatic  hernia without mention of obstruction or gangrene    Dyslipidemia    Elevated prostate specific antigen (PSA)    GERD (gastroesophageal reflux disease)    Herpes zoster without mention of complication    History of cancer of external ear    History of hiatal hernia    Hypertension    Hypertrophy of prostate with urinary obstruction and other lower urinary tract symptoms (LUTS)    Impotence of organic origin    Lumbago    Obesity, unspecified    Osteoarthrosis, unspecified whether generalized or localized, lower leg    Other and unspecified hyperlipidemia    Other malaise and fatigue    Other seborrheic keratosis    Special screening for malignant neoplasm of prostate    Stenosis, cervical spine    Unspecified tinnitus      Past Surgical History:  Procedure Laterality Date   ANTERIOR CERVICAL DECOMP/DISCECTOMY FUSION N/A 02/19/2018   Procedure: ANTERIOR CERVICAL DECOMPRESSION/DISCECTOMY FUSION - CERVICAL FOUR-CERVICAL FIVE - CERVICAL FIVE-CERVICAL SIX;  Surgeon: Earnie Larsson, MD;  Location: [place];  Service: Neurosurgery;  Laterality: N/A;   CARDIOVASCULAR STRESS TEST  06/15/2012   Non-diagnostic for ischemia. No lexiscan EKG changes.   CHOLECYSTECTOMY  1992   EYE SURGERY     eyeband  2006   removal of right eyeband   INGUINAL HERNIA REPAIR  1984   left   INTRAOCULAR LENS REMOVAL  2008   right   LEFT HEART CATHETERIZATION WITH CORONARY ANGIOGRAM N/A 09/05/2013   Procedure: LEFT HEART CATHETERIZATION WITH CORONARY ANGIOGRAM;  Surgeon: Troy Sine, MD;  [facility];  Service: Cardiovascular;  Laterality: N/A;   LOWER VENOUS EXTREMITY DOPPLER  01/26/2011   No evidence of thrombus or thrombophlebitis.   RETINAL DETACHMENT SURGERY  2001   right   TOTAL HIP ARTHROPLASTY  2012   bilateral   TOTAL KNEE ARTHROPLASTY  2011, 2012   bilateral   TRANSTHORACIC ECHOCARDIOGRAM  10/05/2009   EF >55%, normal LV size, systolic, and diastolic function.    Family History  Problem Relation Age of Onset   Diabetes Mother    Arrhythmia Mother    Hypertension Mother    Dementia Father    Pulmonary embolism Father    Hypertension Father    Cancer Sister        Liver cancer - Histocytic lymphoma   Cancer Sister 54       Skin cancer   Hypertension Sister 70   Diabetes Sister    Stroke Brother    Diabetes Brother    Hypertension Brother    Cancer Daughter    Cancer Maternal Grandmother        Skin cancer   Heart attack Maternal Grandfather    Cancer Paternal Grandfather    Hypertension Paternal Grandfather     Social History   Socioeconomic History   Marital status: Married    Spouse name: Not on file   Number of children: Not on file   Years of education: Not on file   Highest education level:  Not on file  Occupational History   Occupation: retired Medical sales representative     Comment: Hospital doctor  Tobacco Use   Smoking status: Never    Passive exposure: Past   Smokeless tobacco: Never  Vaping Use   Vaping Use: Never used  Substance and Sexual Activity   Alcohol use: No   Drug use: No   Sexual activity: Yes    Partners: Female  Comment: wife  Other Topics Concern   Not on file  Social History Narrative   Married   Never smoked   Alcohol none   Exercise -gym 3 days a week (join 3 months ago)   Living Will   Social Determinants of Health   Financial Resource Strain: Low Risk  (10/31/2017)   Overall Financial Resource Strain (CARDIA)    Difficulty of Paying Living Expenses: Not hard at all  Food Insecurity: No Food Insecurity (10/31/2017)   Hunger Vital Sign    Worried About Running Out of Food in the Last Year: Never true    Ran Out of Food in the Last Year: Never true  Transportation Needs: No Transportation Needs (10/31/2017)   PRAPARE - Hydrologist (Medical): No    Lack of Transportation (Non-Medical): No  Physical Activity: Insufficiently Active (10/31/2017)   Exercise Vital Sign    Days of Exercise per Week: 2 days    Minutes of Exercise per Session: 60 min  Stress: No Stress Concern Present (10/31/2017)   [place]    Feeling of Stress : Only a little  Social Connections: Moderately Integrated (10/31/2017)   Social Connection and Isolation Panel [NHANES]    Frequency of Communication with Friends and Family: Twice a week    Frequency of Social Gatherings with Friends and Family: Twice a week    Attends Religious Services: More than 4 times per year    Active Member of [employer] or Organizations: No    Attends Archivist Meetings: Never    Marital Status: Married  Human resources officer Violence: Not At Risk (10/31/2017)   Humiliation, Afraid, Rape, and Kick  questionnaire    Fear of Current or Ex-Partner: No    Emotionally Abused: No    Physically Abused: No    Sexually Abused: No   Review of Systems No environmental allergies Bad arthritis--neck and elswhere--due for replacement of right 2nd MCP Sleeps okay    Objective:   Physical Exam Constitutional:      Appearance: Normal appearance.  Cardiovascular:     Rate and Rhythm: Normal rate and regular rhythm.     Heart sounds: No murmur heard.    No gallop.  Pulmonary:     Effort: Pulmonary effort is normal.     Breath sounds: Normal breath sounds. No wheezing or rales.  Abdominal:     Palpations: Abdomen is soft.     Tenderness: There is no abdominal tenderness. There is no guarding.     Hernia: No hernia is present.     Comments: Diastasis recti and bulges (fat) along RUQ and LUQ No tenderness  Musculoskeletal:     Cervical back: Neck supple.  Lymphadenopathy:     Cervical: No cervical adenopathy.  Neurological:     Mental Status: He is alert.  Psychiatric:        Mood and Affect: Mood normal.        Behavior: Behavior normal.            Assessment & Plan:

## 2022-07-11 NOTE — Assessment & Plan Note (Signed)
BP Readings from Last 3 Encounters:  07/11/22 118/68  06/02/22 136/64  05/27/22 132/80   Okay on amlodipine 10, benazepril 40, spironolactone 25

## 2022-07-11 NOTE — Assessment & Plan Note (Signed)
Symptoms mostly controled with nexium 40 bid

## 2022-07-22 ENCOUNTER — Telehealth: Payer: Self-pay | Admitting: Family Medicine

## 2022-07-22 HISTORY — PX: HAND SURGERY: SHX662

## 2022-07-22 NOTE — Telephone Encounter (Signed)
Contacted pt's wife and advised that Dr Silvio Pate has left for the day. It may be the weekend or Monday before this is done. I advised if he was worse, he needed to go somewhere to be re-evaluated.

## 2022-07-22 NOTE — Telephone Encounter (Signed)
Pt's wife, Peter Congo, called stating the pt still has a cough & requested a refill for the meds, augmentin, Amoxicillin-clav 75-'125mg'$ . I didn't see meds on pt's med list. Preferred pharmacy [facility] [place]. Call back # [phone]

## 2022-07-24 MED ORDER — PREDNISONE 20 MG PO TABS: 40.0000 mg | ORAL_TABLET | Freq: Every day | ORAL | 0 refills | Status: DC

## 2022-07-24 MED ORDER — AMOXICILLIN-POT CLAVULANATE 875-125 MG PO TABS: 1.0000 | ORAL_TABLET | Freq: Two times a day (BID) | ORAL | 0 refills | Status: DC

## 2022-07-25 NOTE — Telephone Encounter (Signed)
Spoke to  pt's wife .

## 2022-08-05 ENCOUNTER — Ambulatory Visit (INDEPENDENT_AMBULATORY_CARE_PROVIDER_SITE_OTHER): Payer: PPO | Admitting: Internal Medicine

## 2022-08-05 ENCOUNTER — Ambulatory Visit: Payer: PPO | Attending: Cardiovascular Disease | Admitting: Cardiovascular Disease

## 2022-08-05 ENCOUNTER — Encounter: Payer: Self-pay | Admitting: Cardiovascular Disease

## 2022-08-05 ENCOUNTER — Encounter: Payer: Self-pay | Admitting: Internal Medicine

## 2022-08-05 VITALS — BP 120/70 | HR 83 | Temp 98.0°F | Ht 68.0 in | Wt 281.2 lb

## 2022-08-05 DIAGNOSIS — E785 Hyperlipidemia, unspecified: Secondary | ICD-10-CM | POA: Diagnosis not present

## 2022-08-05 DIAGNOSIS — Z01818 Encounter for other preprocedural examination: Secondary | ICD-10-CM

## 2022-08-05 DIAGNOSIS — I10 Essential (primary) hypertension: Secondary | ICD-10-CM

## 2022-08-05 DIAGNOSIS — R0609 Other forms of dyspnea: Secondary | ICD-10-CM | POA: Diagnosis not present

## 2022-08-05 DIAGNOSIS — R058 Other specified cough: Secondary | ICD-10-CM | POA: Diagnosis not present

## 2022-08-05 DIAGNOSIS — Z789 Other specified health status: Secondary | ICD-10-CM

## 2022-08-05 DIAGNOSIS — E782 Mixed hyperlipidemia: Secondary | ICD-10-CM

## 2022-08-05 MED ORDER — NEXLIZET 180-10 MG PO TABS: 180.0000 mg | ORAL_TABLET | Freq: Every day | ORAL | 2 refills | Status: DC

## 2022-08-05 MED ORDER — NEXLIZET 180-10 MG PO TABS: 180.0000 mg | ORAL_TABLET | Freq: Every day | ORAL | 0 refills | Status: DC

## 2022-08-05 MED ORDER — OLMESARTAN MEDOXOMIL 40 MG PO TABS: 40.0000 mg | ORAL_TABLET | Freq: Every day | ORAL | 2 refills | Status: DC

## 2022-08-05 NOTE — Progress Notes (Unsigned)
Patient ID: Anthony Torti., male   DOB: March 27, 1951, 71 y.o.   MRN: [id number]       HPI: Anthony [name]. is a 71 y.o. male who presents to the office for a 12 month follow-up cardiology evaluation.   Anthony [name] has a history of hypertension, mild obesity, as well as hyperlipidemia. In the past, he developed CPK elevation secondary to statin therapy. He has been able to tolerate Zetia. A nuclear perfusion study done in October 2013 for chest pain showed normal perfusion without scar or ischemia.  Laboratory in October 2014 showed cholesterol 196, triglycerides significantly elevated at 291, HDL of 34, VLDL increased at 58, and  LDL 106. Renal function was normal. BUN 15 kerning 0.79.   He developed recurrent episodes of chest pain for which he saw [name] and because of exertionally precipitated episodes of discomfort definitive cardiac catheterization was recommended. This was done by me on 09/05/2013 and revealed normal coronary arteries with normal LV function without focal segmental wall motion abnormalities. Ejection fraction was 55%.  He underwent an echo Doppler study on 09/04/2013 which showed an ejection fraction of 55-60%. He did have grade 1 diastolic dysfunction and mild left ventricular hypertrophy. He had mild mitral annular calcification with mildly thickened leaflets and trivial mitral regurgitation. A moderate pressure estimate was 27 mm.  He was  seen by Anthony [name] on 08/17/2016 with complaints of chest discomfort.  He's episodes would occur often when he was walking on his driveway her in the grocery store and had been ongoing for 1-2 months.  He had diffuse tenderness to palpation along his left pectoral region.  On exam it was felt that some of his chest pain was musculoskeletal in etiology.  He was hypertensive and she further titrated amlodipine.  Because of exertional dyspnea.  She referred him for an echo Doppler study which was done on 08/23/2016.  This showed  mild LVH with normal systolic function and grade 1 diastolic dysfunction.  There is very mild increased peak Anthony [name] pressure 34 mm.  I saw him in January 2019 after not having seen him since February 2015.  At that time, his pressure was elevated despite taking amlodipine 7.5 mg.  I recommended further titration to 10 mg daily.  On this increased dose, he states his blood pressure at home typically has been running in the 130-135 range.  He denies chest pain PND orthopnea.  He has a history of GERD which is controlled with Nexium.  He has had some issues with left arm paresthesias and was diagnosed with significant cervical degenerative disc disease.  He is scheduled to undergo neck surgery on December 12, 2017 by Anthony [name].  Preoperative surgical clearance was recommended.  He denies any chest pain or shortness of breath.  He has not been exercising as he had in the past in the winter months but hopes to do so following his surgery and with improved weather.    When I saw him in April 2019 he was given surgical clearance for his cervical surgery.  He underwent successful surgery by Anthony [name] involving C4-5 and C5-6 and tolerated this well.   I saw him in January 2020 at that time he had begun to notice some episodes of chest wall discomfort.  He denied any clear-cut exertional precipitation and noted the chest discomfort intermittently.  He was unable to walk well secondary to bilateral knee and hip replacement surgery.  He also had experience of mild exertional shortness of breath.  I felt most likely that his chest pain was of musculoskeletal etiology.  However with his exertional dyspnea I recommended reevaluation of his coronary anatomy and suggested CT coronary angiography and also a 2-year follow-up echo Doppler assessment.  His echo study was done on August 30, 2018 and showed an EF of 60 to 65% without wall motion abnormalities.  There was grade 2 diastolic dysfunction.   There was mild increased Anthony [name] pressure at 35 mm.  There was mild MR.  Coronary CTA yielded calcium score of 26.4 which was 20th percentile for age and sex matched control.  He had normal coronary origin with right dominance.  There was minimal plaque in the ostial LAD and OM1.  CT demonstrated atherosclerotic disease in the descending thoracic aorta.  I saw him on March 04, 2019 at which time he denied any chest pain, palpitations or dyspnea.  He was experiencing bilateral leg swelling despite taking HCTZ.   During this COVID pandemic, he has gained approximately 15 pounds.  He has not been exercising due to hip discomfort.  Laboratory in March 2020 showed a total cholesterol 173, triglycerides 143 HDL 46 and LDL 103.  During that evaluation I recommended he discontinue hydrochlorothiazide and in its place start furosemide 40 mg for 2 days and then 20 mg daily.  I saw him in follow-up on September 03, 2019.  Prior to that evaluation he was  on amlodipine 10 mg, benazepril 40 mg, furosemide 20 mg daily.  At times he does experience shortness of breath and notes swelling right leg greater than left.  He has been able to take Zetia 10 mg in addition to very low-dose rosuvastatin 5 mg once a week and is tolerating this well.  Recently he has noticed slight increase in the shortness of breath.  He admits to further weight gain.  During that evaluation with his shortness of breath spironolactone 12.5 mg was added to his medical regimen both from previous improved blood pressure control as well as potential improvement in diastolic dysfunction contributing to his exertional dyspnea.  Laboratory 1 month ago showed total cholesterol 177, HDL 40 triglycerides 182 LDL 107.  BUN 13 creatinine 0.96.  I saw him in follow-up on October 07, 2019.  Repeat chemistry profile showed a potassium of 4.5.  BUN 13 and creatinine 1.06.  Over the prior month, he has had purposeful weight loss contributed by reduction in his meal servings.   He did receive his Covid vaccination.  He did experience some nonexertional shoulder chest soreness following the vaccination.  He denies any exertional chest pain symptomatology.  During his most recent evaluation blood pressure was improved but was still 138/78 and I recommended slight additional titration of spironolactone to 25 mg daily.  I saw him in June 2021 at that time he was experiencing mild chest tightness for 3 to 4 weeks.   The symptoms are not always exertional.  He admits to decreased energy.  He has been having difficulty with reflux.  He also experiences some sharp pain discomfort going to his back which is nonexertional.  He has been on amlodipine 10 mg, benazepril 40 mg, furosemide 20 mg daily and spironolactone 25 mg daily for his blood pressure and shortness of breath.  He was no longer taking low-dose Crestor and has continued to be on Zetia.  Lipid studies on December 02, 2019 showed an LDL cholesterol that had risen to 107.  Total  cholesterol was 173 triglycerides 139.  He sees Anthony [name] who may want to do esophageal dilatation in the future.  He admitted to fatigability, no energy, nocturia at least 3-4 times per night as well as the need to take a nap every day.  During that evaluation, since he had stopped statin therapy I recommended neclizet at 180/10 which is combination bempedoic acid plus Zetia in 1 pill.  We also discussed possible sleep apnea and the importance of weight loss.  When I saw him in September 2021 he told me he could not tolerate bempedoic acid due to development of abdominal cramps and for this reason has only been on Zetia.  He continues to have GERD but is not having any anginal symptoms.  He has difficulty walking secondary to his bilateral knee replacements and hip surgery.  He continues to experience daytime sleepiness, nocturia, and admits to snoring.   I last evaluated him on September 28, 2020.  He never pursued a sleep evaluation.  At that time he felt he  was sleeping well sleeping well and typically sleeps between 8 and 9 hours per night.  He denied any exertional anginal symptomatology but at times notes a very sharp twinge anteriorly.  He continues to have issues with his neck and will be undergoing an ablation procedure later this week.  He has not been exercising much due to arthritic issues and he is status post bilateral knee and hip replacement surgeries.  He continues to be on amlodipine 10 mg, benazepril 40 mg, furosemide 20 mg, and spironolactone 25 mg daily.  He continues to be on ezetimibe and is tolerating this well.  He had laboratory by his primary physician in on August 10, 2020 which showed total cholesterol 190, HDL 44, LDL 113, triglycerides 214.   I last saw him on August 02, 2021 and since my prior evaluation evaluations with Anthony Memos, Anthony [name] and Anthony Lofts, Anthony [name].  Presently, he denies any anginal type chest pain or shortness of breath.  He has a remote history of bilateral knee and hip replacements and as result does not walk much.  He continues to be on amlodipine 10 mg, benazepril 40 mg, carvedilol 6.25 mg twice a day, furosemide 40 mg and spironolactone 25 mg daily.  He continues to be on Zetia and over-the-counter fish oil for hyperlipidemia.  At times he has taken extra Lasix if there was increasing edema.  He has not been successful with weight loss.  He was last seen by Anthony Memos, Anthony [name] on February 08, 2022.  He had experienced shortness of breath with walking distances over 100 feet.  He had undergone lipid studies on May 31, 2022 which showed total cholesterol 167, HDL 46, triglycerides 147 and LDL 97.  Presently, he denies any chest pain.  He still notes occasional shortness of breath which has not progressed.  He is in need to undergo orthopedic surgery with right index knuckle replacement by Anthony [name] at [place].  He presents today for preoperative cardiology clearance and is scheduled to see Anthony [name]  later for pulmonary preoperative assessment.  He continues to be on amlodipine 10 mg, benazepril 40 mg, carvedilol 6.25 mg twice daily and spironolactone 25 mg daily.  He is on Zetia for hyperlipidemia 10 mg he takes furosemide 40 mg daily and occasionally takes an extra 20 mg depending upon fluid or weight gain.  He is on Nexium for GERD.  He presents for evaluation.  Past Medical History:  Diagnosis  Date   Benign neoplasm of colon    Benign neoplasm of skin of upper limb, including shoulder    Cancer of skin of external cheek    Carpal tunnel syndrome    Chest pain    a. normal cors by cath in 2015   Diaphragmatic hernia without mention of obstruction or gangrene    Dyslipidemia    Elevated prostate specific antigen (PSA)    GERD (gastroesophageal reflux disease)    Herpes zoster without mention of complication    History of cancer of external ear    History of hiatal hernia    Hypertension    Hypertrophy of prostate with urinary obstruction and other lower urinary tract symptoms (LUTS)    Impotence of organic origin    Lumbago    Obesity, unspecified    Osteoarthrosis, unspecified whether generalized or localized, lower leg    Other and unspecified hyperlipidemia    Other malaise and fatigue    Other seborrheic keratosis    Special screening for malignant neoplasm of prostate    Stenosis, cervical spine    Unspecified tinnitus     Past Surgical History:  Procedure Laterality Date   ANTERIOR CERVICAL DECOMP/DISCECTOMY FUSION N/A 02/19/2018   Procedure: ANTERIOR CERVICAL DECOMPRESSION/DISCECTOMY FUSION - CERVICAL FOUR-CERVICAL FIVE - CERVICAL FIVE-CERVICAL SIX;  Surgeon: Earnie Larsson, MD;  Location: [facility];  Service: Neurosurgery;  Laterality: N/A;   CARDIOVASCULAR STRESS TEST  06/15/2012   Non-diagnostic for ischemia. No lexiscan EKG changes.   CHOLECYSTECTOMY  1992   EYE SURGERY     eyeband  2006   removal of right eyeband   INGUINAL HERNIA REPAIR  1984   left   INTRAOCULAR  LENS REMOVAL  2008   right   LEFT HEART CATHETERIZATION WITH CORONARY ANGIOGRAM N/A 09/05/2013   Procedure: LEFT HEART CATHETERIZATION WITH CORONARY ANGIOGRAM;  Surgeon: Troy Sine, MD;  Location: [facility] CATH LAB;  Service: Cardiovascular;  Laterality: N/A;   LOWER VENOUS EXTREMITY DOPPLER  01/26/2011   No evidence of thrombus or thrombophlebitis.   RETINAL DETACHMENT SURGERY  2001   right   TOTAL HIP ARTHROPLASTY  2012   bilateral   TOTAL KNEE ARTHROPLASTY  2011, 2012   bilateral   TRANSTHORACIC ECHOCARDIOGRAM  10/05/2009   EF >55%, normal LV size, systolic, and diastolic function.    Allergies  Allergen Reactions   Lipitor [Atorvastatin] Other (See Comments)    Leg pain/cramps; statin myopathy   Red Yeast Rice [Cholestin] Other (See Comments)    Makes legs hurt, myopathy   Statins Other (See Comments)    "muscle break down", statin myopathy   Statins Support [Acid Blockers Support] Other (See Comments)   Codeine Other (See Comments)    Headache   Latex Rash    Current Outpatient Medications  Medication Sig Dispense Refill   amLODipine (NORVASC) 10 MG tablet TAKE 1 TABLET BY MOUTH EVERY DAY IN THE MORNING 90 tablet 3   benzonatate (TESSALON PERLES) 100 MG capsule Take 1 capsule (100 mg total) by mouth 3 (three) times daily as needed for cough. 60 capsule 1   brimonidine (ALPHAGAN P) 0.1 % SOLN      carvedilol (COREG) 6.25 MG tablet TAKE 1 TABLET BY MOUTH TWICE A DAY 180 tablet 3   dorzolamide-timolol (COSOPT) 22.3-6.8 MG/ML ophthalmic solution Place 2 drops into the right eye 2 (two) times daily.      esomeprazole (NEXIUM) 40 MG capsule TAKE 1 CAPSULE BY MOUTH AT BEDTIME. FOR REFLUX 90  capsule 1   ezetimibe (ZETIA) 10 MG tablet Take 1 tablet (10 mg total) by mouth daily. 90 tablet 2   finasteride (PROSCAR) 5 MG tablet TAKE 1 TABLET BY MOUTH EVERY DAY IN THE MORNING 90 tablet 1   furosemide (LASIX) 40 MG tablet Take 1 tablet (40 mg total) by mouth daily. Take an additional 20 mg  (0.5 tablet) for weight again. (3 pounds overnight or 5 pounds in 1 week.) 105 tablet 3   Omega-3 Fatty Acids (FISH OIL PO) Take 1,400 mg by mouth.     Polyvinyl Alcohol-Povidone (REFRESH OP) Place 1 drop into the right eye daily as needed (dry eyes/irritation).     spironolactone (ALDACTONE) 25 MG tablet TAKE 1 TABLET (25 MG TOTAL) BY MOUTH DAILY. 90 tablet 3   olmesartan (BENICAR) 40 MG tablet Take 1 tablet (40 mg total) by mouth daily. 30 tablet 2   No current facility-administered medications for this visit.    Social History   Socioeconomic History   Marital status: Married    Spouse name: Not on file   Number of children: Not on file   Years of education: Not on file   Highest education level: Not on file  Occupational History   Occupation: retired Medical sales representative     Comment: Hospital doctor  Tobacco Use   Smoking status: Never    Passive exposure: Past   Smokeless tobacco: Never  Vaping Use   Vaping Use: Never used  Substance and Sexual Activity   Alcohol use: No   Drug use: No   Sexual activity: Yes    Partners: Female    Comment: wife  Other Topics Concern   Not on file  Social History Narrative   Married   Never smoked   Alcohol none   Exercise -gym 3 days a week (join 3 months ago)   Living Will   Social Determinants of Health   Financial Resource Strain: Low Risk  (10/31/2017)   Overall Financial Resource Strain (CARDIA)    Difficulty of Paying Living Expenses: Not hard at all  Food Insecurity: No Food Insecurity (10/31/2017)   Hunger Vital Sign    Worried About Running Out of Food in the Last Year: Never true    [place] in the Last Year: Never true  Transportation Needs: No Transportation Needs (10/31/2017)   PRAPARE - Hydrologist (Medical): No    Lack of Transportation (Non-Medical): No  Physical Activity: Insufficiently Active (10/31/2017)   Exercise Vital Sign    Days of Exercise per Week: 2 days    Minutes of  Exercise per Session: 60 min  Stress: No Stress Concern Present (10/31/2017)   [place]    Feeling of Stress : Only a little  Social Connections: Moderately Integrated (10/31/2017)   Social Connection and Isolation Panel [NHANES]    Frequency of Communication with Friends and Family: Twice a week    Frequency of Social Gatherings with Friends and Family: Twice a week    Attends Religious Services: More than 4 times per year    Active Member of [employer] or Organizations: No    Attends Archivist Meetings: Never    Marital Status: Married  Human resources officer Violence: Not At Risk (10/31/2017)   Humiliation, Afraid, Rape, and Kick questionnaire    Fear of Current or Ex-Partner: No    Emotionally Abused: No    Physically Abused: No  Sexually Abused: No   Social is notable that he is married has 2 children and 2 grandchildren. He is not routinely exercise. There is no tobacco use. He does drink occasional alcohol.  Family History  Problem Relation Age of Onset   Diabetes Mother    Arrhythmia Mother    Hypertension Mother    Dementia Father    Pulmonary embolism Father    Hypertension Father    Cancer Sister        Liver cancer - Histocytic lymphoma   Cancer Sister 42       Skin cancer   Hypertension Sister 74   Diabetes Sister    Stroke Brother    Diabetes Brother    Hypertension Brother    Cancer Daughter    Cancer Maternal Grandmother        Skin cancer   Heart attack Maternal Grandfather    Cancer Paternal Grandfather    Hypertension Paternal Grandfather     ROS General: Negative; No fevers, chills, or night sweats;  positive for a 16 pound weight loss in September 2018.  Positive for fatigue HEENT: Negative; No changes in vision or hearing, sinus congestion, difficulty swallowing Pulmonary: Negative; No cough, wheezing, shortness of breath, hemoptysis Cardiovascular: Positive for occasional  exertional shortness of breath.  No exertional chest pain.  Positive for right leg greater than left swelling GI: Positive for GERD  GU: Positive for nocturia; BPH Musculoskeletal: Lateral hip and knee discomfort; status post recent surgery C4-5/C5-6 Hematologic/Oncology: Negative; no easy bruising, bleeding Endocrine: Negative; no heat/cold intolerance; no diabetes Neuro: Mild left arm paresthesias from his cervical degenerative disease Skin: Negative; No rashes or skin lesions Psychiatric: Negative; No behavioral problems, depression Sleep: Negative; No snoring, daytime sleepiness, hypersomnolence, bruxism, restless legs, hypnogognic hallucinations, no cataplexy Other comprehensive 14 point system review is negative.  PE BP 110/69   Pulse 71   Ht 5' 8" (1.727 m)   Wt 281 lb 9.6 oz (127.7 kg)   SpO2 98%   BMI 42.82 kg/m    Repeat blood pressure by me was 120/68  Wt Readings from Last 3 Encounters:  08/05/22 281 lb 3.2 oz (127.6 kg)  08/05/22 281 lb 9.6 oz (127.7 kg)  07/11/22 288 lb (130.6 kg)   General: Alert, oriented, no distress.  Morbid obesity with BMI 42.8 Skin: normal turgor, no rashes, warm and dry HEENT: Normocephalic, atraumatic. Pupils equal round and reactive to light; sclera anicteric; extraocular muscles intact;  Nose without nasal septal hypertrophy Mouth/Parynx benign; Mallinpatti scale 3 Neck: No JVD, no carotid bruits; normal carotid upstroke Lungs: clear to ausculatation and percussion; no wheezing or rales Chest wall: without tenderness to palpitation Heart: PMI not displaced, RRR, s1 s2 normal, 1/6 systolic murmur, no diastolic murmur, no rubs, gallops, thrills, or heaves Abdomen: soft, nontender; no hepatosplenomehaly, BS+; abdominal aorta nontender and not dilated by palpation. Back: no CVA tenderness Pulses 2+ Musculoskeletal: full range of motion, normal strength, no joint deformities Extremities: no clubbing cyanosis or edema, Homan's sign negative   Neurologic: grossly nonfocal; Cranial nerves grossly wnl Psychologic: Normal mood and affect    August 05, 2022 ECG (independently read by me): NSR at 71, no ecotpy, no ST changes  August 02, 2021 ECG (independently read by me): Normal sinus rhythm at 74 bpm.  No ectopy.  Normal intervals.  September 2021 ECG (independently read by me): Normal sinus rhythm at 82 bpm.  No significant ST-T change.  Normal intervals.  No ectopy  June 2021  ECG (independently read by me): NSR at 73;  No ectopy, no ST changes, normal intervals  January 12,2021 ECG (independently read by me): Normal sinus rhythm at 81 bpm.  No ectopy.  Normal intervals.  July 2020 ECG (independently read by me): NSR at 70  January 2020 ECG (independently read by me): Normal sinus rhythm at 74 bpm.  No ectopy.  No ST segment changes.  April 2019 ECG (independently read by me): Normal sinus rhythm at 66 bpm.  Normal intervals.  No ectopy.  No ST segment changes.  August 28, 2017 ECG (independently read by me): Normal sinus rhythm at 63 bpm.  Normal intervals.  No ST segment changes.  February 2015 ECG (independently read by me): Normal sinus rhythm at 66 beats per minute. PR interval 160 ms. QTC intervals 34 ms. No significant ST-T changes.  November 2014 ECG (independently read by me)::Normal sinus rhythm at 69 beats per minute. No ectopy. Normal intervals.  LABS:     Latest Ref Rng & Units 06/02/2022    2:09 PM 09/01/2021    9:49 AM 06/22/2021    3:56 PM  BMP  Glucose 65 - 139 mg/dL 86  96  84   BUN 7 - 25 mg/dL _0 Creatinine 0.70 - 1.28 mg/dL 1.03  0.94  1.17   BUN/Creat Ratio 6 - 22 (calc) SEE NOTE:  NOT APPLICABLE  20   Sodium 135 - 146 mmol/L 140  141  142   Potassium 3.5 - 5.3 mmol/L 3.9  4.1  4.8   Chloride 98 - 110 mmol/L 106  106  106   CO2 20 - 32 mmol/L _1 Calcium 8.6 - 10.3 mg/dL 8.9  9.3  9.3       Latest Ref Rng & Units 06/02/2022    2:09 PM 03/01/2021    9:00 AM 11/25/2019     8:04 AM  CBC  WBC 3.8 - 10.8 Thousand/uL 9.7  7.9  7.8   Hemoglobin 13.2 - 17.1 g/dL 15.2  15.8  16.1   Hematocrit 38.5 - 50.0 % 44.0  47.7  47.5   Platelets 140 - 400 Thousand/uL 205  204  220    Lab Results  Component Value Date   TSH 3.27 11/25/2019    Lab Results  Component Value Date   HGBA1C 5.4 03/01/2021      Latest Ref Rng & Units 09/01/2021    9:49 AM 12/02/2019    8:44 AM 11/25/2019    8:04 AM  Hepatic Function  Total Protein 6.1 - 8.1 g/dL 5.7  5.5  5.9   Albumin 3.8 - 4.8 g/dL  4.1    AST 10 - 35 U/L _2 ALT 9 - 46 U/L _3 Alk Phosphatase 39 - 117 IU/L  98    Total Bilirubin 0.2 - 1.2 mg/dL 0.5  0.3  0.5    Lipid Panel     Component Value Date/Time   CHOL 167 03/30/2022 0858   CHOL 173 12/02/2019 0844   CHOL 191 07/22/2013 0835   TRIG 147 03/30/2022 0858   TRIG 253 (H) 07/22/2013 0835   HDL 46 03/30/2022 0858   HDL 41 12/02/2019 0844   HDL 38 (L) 07/22/2013 0835   CHOLHDL 3.6 03/30/2022 0858   VLDL 35 (H) 10/27/2016 0825   LDLCALC 97 03/30/2022 0858   LDLCALC 102 (H) 07/22/2013  0835    RADIOLOGY: No results found.  IMPRESSION:  1. Hyperlipidemia with target LDL less than 70   2. Morbid obesity ([place])   3. Preoperative clearance   4. Statin intolerance   5. Essential hypertension      ASSESSMENT AND PLAN: Mr. Koskela is a 71 year-old gentleman who has a history of morbid obesity, hyperlipidemia, hypertension, and GERD.  He developed chest discomfort in 2015 which ultimately led to definitive cardiac catheterization which revealed normal coronary arteries.  He  underwent successful surgery by Anthony [name] and tolerated this well from a cardiac standpoint.  He had developed some episodes of chest pain which most likely were of musculoskeletal etiology.  A CTA in 2020 showed minimal coronary plaque with a calcium score of 26.4  (20th percentile) involving his LAD ostium and OM1 vessel.  He also was noted to have  atherosclerosis of his thoracic aorta.  Presently, he denies any chest pain.  He has had issues with shortness of breath.  His last echo Doppler study on June 11, 2021 showed normal LV function with EF 60 to 65% with normal wall motion and mild grade 1 diastolic dysfunction.  He had mildly elevated systolic pressure.  There was mild aortic valve sclerosis without stenosis.  There was mild aortic dilatation of the ascending aorta at 41 mm and aortic arch at 36 mm.  He is in need to undergo knuckle replacement surgery on his right index finger.  He will hold aspirin for the procedure.  With his mild coronary calcification and LDL cholesterol at 97, I have suggested the addition of bempedoic acid to his Zetia which should help him achieve an LDL less than 70 and I provided him samples of Nexletol 180 mg.  If he tolerates this, he can then be switched to nexlizet which is the combination bempedoic acid/Zetia and 1 pill.  He was given preoperative cardiac clearance and will hold his aspirin for at least 5 days prior to the procedure. I am recommending he undergo a follow-up chemistry, fasting lipid studies and will check an LP(a) in 3 months and plan to see him in 6 months for follow-up evaluation.   Troy Sine, MD, [facility]  08/11/2022 2:27 PM

## 2022-08-05 NOTE — Telephone Encounter (Signed)
Dr. Melvyn Novas your AVS said to stop Lisinopril but he was not on Lisinopril. Can you advise what medication you wanted him to stop taking?

## 2022-08-05 NOTE — Patient Instructions (Addendum)
Nexium 40 mg should be Take 30- 60 min before your first and last meals of the day   Stop lisinopril and the cough should improve in 3 days but if not ok use tessalon 200 mg up to 3 x daily if needed  GERD (REFLUX)  is an extremely common cause of respiratory symptoms just like yours , many times with no obvious heartburn at all.    It can be treated with medication, but also with lifestyle changes including elevation of the head of your bed (ideally with 6 -8inch blocks under the headboard of your bed),  Smoking cessation, avoidance of late meals, excessive alcohol, and avoid fatty foods, chocolate, peppermint, colas, red wine, and acidic juices such as orange juice.  NO MINT OR MENTHOL PRODUCTS SO NO COUGH DROPS  USE SUGARLESS CANDY INSTEAD (Jolley ranchers or Stover's or Life Savers) or even ice chips will also do - the key is to swallow to prevent all throat clearing. NO OIL BASED VITAMINS - use powdered substitutes.  Avoid fish oil when coughing.   You are cleared for hand surgery.   If you are satisfied with your treatment plan,  let your doctor know and he/she can either refill your medications or you can return here when your prescription runs out.     If in any way you are not 100% satisfied,  please  return in 4 weeks >>>   If 100% better, tell your friends!  Pulmonary follow up is as needed

## 2022-08-05 NOTE — Assessment & Plan Note (Signed)
Onst 2022 in pt with chronic GERD on ACEi  - d/c acei 08/05/2022 and max rx for GERD / tessalon prn   Upper airway cough syndrome (previously labeled PNDS),  is so named because it's frequently impossible to sort out how much is  CR/sinusitis with freq throat clearing (which can be related to primary GERD)   vs  causing  secondary (" extra esophageal")  GERD from wide swings in gastric pressure that occur with throat clearing, often  promoting self use of mint and menthol lozenges that reduce the lower esophageal sphincter tone and exacerbate the problem further in a cyclical fashion.   These are the same pts (now being labeled as having "irritable larynx syndrome" by some cough centers) who not infrequently have a history of having failed to tolerate ace inhibitors,  dry powder inhalers or biphosphonates or report having atypical/extraesophageal reflux symptoms that don't respond to standard doses of PPI  and are easily confused as having aecopd or asthma flares by even experienced allergists/ pulmonologists (myself included).

## 2022-08-05 NOTE — Assessment & Plan Note (Signed)
Body mass index is 42.76 kg/m.    Lab Results  Component Value Date   TSH 3.27 11/25/2019      Contributing to doe and risk of worsening GERD >>>   reviewed the need and the process to achieve and maintain neg calorie balance > defer f/u primary care including intermittently monitoring thyroid status    Each maintenance medication was reviewed in detail including emphasizing most importantly the difference between maintenance and prns and under what circumstances the prns are to be triggered using an action plan format where appropriate.  Total time for H and P, chart review, counseling,  directly observing portions of ambulatory 02 saturation study/ and generating customized AVS unique to this office visit / same day charting = 45 min with pt new to me

## 2022-08-05 NOTE — Patient Instructions (Signed)
Medication Instructions:  Start Nexlizet 180-10 mg ( Take 1 Tablet Daily). *If you need a refill on your cardiac medications before your next appointment, please call your pharmacy*   Lab Work: CMET, Lipid panel, LPA. To be Done in 3 months. If you have labs (blood work) drawn today and your tests are completely normal, you will receive your results only by: [place] (if you have MyChart) OR A paper copy in the mail If you have any lab test that is abnormal or we need to change your treatment, we will call you to review the results.   Testing/Procedures: No Testing    Follow-Up: At [facility], you and your health needs are our priority.  As part of our continuing mission to provide you with exceptional heart care, we have created designated Provider Care Teams.  These Care Teams include your primary Cardiologist (physician) and Advanced Practice Providers (APPs -  Physician Assistants and Nurse Practitioners) who all work together to provide you with the care you need, when you need it.  We recommend signing up for the patient portal called "MyChart".  Sign up information is provided on this After Visit Summary.  MyChart is used to connect with patients for Virtual Visits (Telemedicine).  Patients are able to view lab/test results, encounter notes, upcoming appointments, etc.  Non-urgent messages can be sent to your provider as well.   To learn more about what you can do with MyChart, go to [email or web address].    Your next appointment:   3 month(s)  The format for your next appointment:   In Person  Provider:   Shelva Majestic, MD

## 2022-08-05 NOTE — Progress Notes (Signed)
Anthony [name]., male    DOB: 07/02/1951   MRN: [id number]   Brief patient profile:  5   yowm never smoker wm referred to pulmonary clinic in [facility]  08/05/2022 by anesthesiology for preop eval for cough x one year assoc with mild doe.       History of Present Illness  08/05/2022  Pulmonary/ 1st office eval/ Anthony [name] / [employer] on ACEi  Chief Complaint  Patient presents with   Consult  Dyspnea:  worse over the last year prior to OV  but limited anyway to a block or two due hips and knees s/p multiple joint replacement but no h/o RA  Cough: p eating before get up/ choking sensation no better with egd (the sensation is in the throat, not in the chest) and still having overt hb on ppi though not ac / tessalon helps the cough  Sleep: adjustable to 30 degrees / no cough noct  SABA use: none   No obvious other  day to day or daytime pattern/variability or assoc excess/ purulent sputum or mucus plugs or hemoptysis or cp or chest tightness, subjective wheeze or overt sinus  symptoms.   Sleeping as above  without nocturnal  or early am exacerbation  of respiratory  c/o's or need for noct saba. Also denies any obvious fluctuation of symptoms with weather or environmental changes or other aggravating or alleviating factors except as outlined above   No unusual exposure hx or h/o childhood pna/ asthma or knowledge of premature birth.  Current Allergies, Complete Past Medical History, Past Surgical History, Family History, and Social History were reviewed in [employer] record.  ROS  The following are not active complaints unless bolded Hoarseness, sore throat, dysphagia/mild globus sensation  dental problems, itching, sneezing,  nasal congestion or discharge of excess mucus or purulent secretions, ear ache,   fever, chills, sweats, unintended wt loss or wt gain, classically pleuritic or exertional cp,  orthopnea pnd or arm/hand swelling  or leg swelling,  presyncope, palpitations, abdominal pain, anorexia, nausea, vomiting, diarrhea  or change in bowel habits or change in bladder habits, change in stools or change in urine, dysuria, hematuria,  rash, arthralgias, visual complaints, headache, numbness, weakness or ataxia or problems with walking or coordination,  change in mood or  memory.           Past Medical History:  Diagnosis Date   Benign neoplasm of colon    Benign neoplasm of skin of upper limb, including shoulder    Cancer of skin of external cheek    Carpal tunnel syndrome    Chest pain    a. normal cors by cath in 2015   Diaphragmatic hernia without mention of obstruction or gangrene    Dyslipidemia    Elevated prostate specific antigen (PSA)    GERD (gastroesophageal reflux disease)    Herpes zoster without mention of complication    History of cancer of external ear    History of hiatal hernia    Hypertension    Hypertrophy of prostate with urinary obstruction and other lower urinary tract symptoms (LUTS)    Impotence of organic origin    Lumbago    Obesity, unspecified    Osteoarthrosis, unspecified whether generalized or localized, lower leg    Other and unspecified hyperlipidemia    Other malaise and fatigue    Other seborrheic keratosis    Special screening for malignant neoplasm of prostate    Stenosis, cervical spine  Unspecified tinnitus     Outpatient Medications Prior to Visit  Medication Sig Dispense Refill   amLODipine (NORVASC) 10 MG tablet TAKE 1 TABLET BY MOUTH EVERY DAY IN THE MORNING 90 tablet 3   benazepril (LOTENSIN) 40 MG tablet TAKE 1 TABLET BY MOUTH EVERY DAY 90 tablet 3   benzonatate (TESSALON PERLES) 100 MG capsule Take 1 capsule (100 mg total) by mouth 3 (three) times daily as needed for cough. 60 capsule 1   brimonidine (ALPHAGAN P) 0.1 % SOLN      carvedilol (COREG) 6.25 MG tablet TAKE 1 TABLET BY MOUTH TWICE A DAY 180 tablet 3   dorzolamide-timolol (COSOPT) 22.3-6.8 MG/ML ophthalmic  solution Place 2 drops into the right eye 2 (two) times daily.      esomeprazole (NEXIUM) 40 MG capsule TAKE 1 CAPSULE BY MOUTH AT BEDTIME. FOR REFLUX 90 capsule 1   ezetimibe (ZETIA) 10 MG tablet Take 1 tablet (10 mg total) by mouth daily. 90 tablet 2   finasteride (PROSCAR) 5 MG tablet TAKE 1 TABLET BY MOUTH EVERY DAY IN THE MORNING 90 tablet 1   furosemide (LASIX) 40 MG tablet Take 1 tablet (40 mg total) by mouth daily. Take an additional 20 mg (0.5 tablet) for weight again. (3 pounds overnight or 5 pounds in 1 week.) 105 tablet 3   Omega-3 Fatty Acids (FISH OIL PO) Take 1,400 mg by mouth.     Polyvinyl Alcohol-Povidone (REFRESH OP) Place 1 drop into the right eye daily as needed (dry eyes/irritation).     spironolactone (ALDACTONE) 25 MG tablet TAKE 1 TABLET (25 MG TOTAL) BY MOUTH DAILY. 90 tablet 3   aspirin EC 81 MG tablet Take 81 mg by mouth every morning. (Patient not taking: Reported on 08/05/2022)     Bempedoic Acid-Ezetimibe (NEXLIZET) 180-10 MG TABS Take 180 mg by mouth daily. (Patient not taking: Reported on 08/05/2022) 30 tablet 2   Bempedoic Acid-Ezetimibe (NEXLIZET) 180-10 MG TABS Take 180 mg by mouth daily. (Patient not taking: Reported on 08/05/2022) 28 tablet 0   No facility-administered medications prior to visit.     Objective:     BP 120/70 (BP Location: Right Arm, Patient Position: Sitting, Cuff Size: Large)   Pulse 83   Temp 98 F (36.7 C) (Oral)   Ht '5\' 8"'$  (1.727 m)   Wt 281 lb 3.2 oz (127.6 kg)   SpO2 94%   BMI 42.76 kg/m   SpO2: 94 %  Obese pleasant amb wm nad    HEENT : Oropharynx  clear         NECK :  without  apparent JVD/ palpable Nodes/TM    LUNGS: no acc muscle use,  Nl contour chest which is clear to A and P bilaterally without cough on insp or exp maneuvers   CV:  RRR  no s3 or murmur or increase in P2, and no edema   ABD: obese  soft and nontender with nl inspiratory excursion in the supine position. No bruits or organomegaly  appreciated   MS:  Nl gait/ ext warm without deformities Or obvious joint restrictions  calf tenderness, cyanosis or clubbing    SKIN: warm and dry without lesions    NEURO:  alert, approp, nl sensorium with  no motor or cerebellar deficits apparent.    I personally reviewed images and agree with radiology impression as follows:  CXR:   pa and lateral No active cardiopulmonary disease    Assessment   Upper airway cough syndrome Onst  2022 in pt with chronic GERD on ACEi  - d/c acei 08/05/2022 and max rx for GERD / tessalon prn   Upper airway cough syndrome (previously labeled PNDS),  is so named because it's frequently impossible to sort out how much is  CR/sinusitis with freq throat clearing (which can be related to primary GERD)   vs  causing  secondary (" extra esophageal")  GERD from wide swings in gastric pressure that occur with throat clearing, often  promoting self use of mint and menthol lozenges that reduce the lower esophageal sphincter tone and exacerbate the problem further in a cyclical fashion.   These are the same pts (now being labeled as having "irritable larynx syndrome" by some cough centers) who not infrequently have a history of having failed to tolerate ace inhibitors,  dry powder inhalers or biphosphonates or report having atypical/extraesophageal reflux symptoms that don't respond to standard doses of PPI  and are easily confused as having aecopd or asthma flares by even experienced allergists/ pulmonologists (myself included).     DOE (dyspnea on exertion) Onset with upper airway cough 2022 - 08/05/2022   Walked on RA  x  3  lap(s) =  approx 525  ft  @ moderate pace, stopped due to end of study  with lowest 02 sats 93%   Suspect this is related to obesity/ conditioning and ? Effects of ACEi but note also on coreg and timolol so could have component of asthma so f/u in 4 weeks if not back to baseline to regroup.  In meantime I see no contraindication to hand  surgery   Essential hypertension D/c acei 08/05/2022  due to cough/dysphagia   In the best review of chronic cough to date ( NEJM 2016 375 8416-6063) ,  ACEi are now felt to cause cough in up to  20% of pts which is a 4 fold increase from previous reports and does not include the variety of non-specific complaints we see in pulmonary clinic in pts on ACEi but previously attributed to another dx like  Copd/asthma and  include PNDS, "throat congestion" = globus and chest congestion, "bronchitis", unexplained dyspnea and noct "strangling" sensations, and hoarseness, but also  atypical /refractory GERD symptoms like dysphagia and "bad heartburn"   The only way I know  to prove this is not an "ACEi Case" is a trial off ACEi x a minimum of 6 weeks then regroup.   >>> try olmesartan 40 mg one daily and return in 4 weeks if not improving    Morbid obesity (Alex) Body mass index is 42.76 kg/m.    Lab Results  Component Value Date   TSH 3.27 11/25/2019      Contributing to doe and risk of worsening GERD >>>   reviewed the need and the process to achieve and maintain neg calorie balance > defer f/u primary care including intermittently monitoring thyroid status    Each maintenance medication was reviewed in detail including emphasizing most importantly the difference between maintenance and prns and under what circumstances the prns are to be triggered using an action plan format where appropriate.  Total time for H and P, chart review, counseling,  directly observing portions of ambulatory 02 saturation study/ and generating customized AVS unique to this office visit / same day charting = 45 min with pt new to me                      Christinia Gully, MD 08/05/2022

## 2022-08-05 NOTE — Assessment & Plan Note (Signed)
Onset with upper airway cough 2022 - 08/05/2022   Walked on RA  x  3  lap(s) =  approx 525  ft  @ moderate pace, stopped due to end of study  with lowest 02 sats 93%   Suspect this is related to obesity/ conditioning and ? Effects of ACEi but note also on coreg and timolol so could have component of asthma so f/u in 4 weeks if not back to baseline to regroup.  In meantime I see no contraindication to hand surgery

## 2022-08-05 NOTE — Assessment & Plan Note (Signed)
D/c acei 08/05/2022  due to cough/dysphagia   In the best review of chronic cough to date ( NEJM 2016 375 1423-9532) ,  ACEi are now felt to cause cough in up to  20% of pts which is a 4 fold increase from previous reports and does not include the variety of non-specific complaints we see in pulmonary clinic in pts on ACEi but previously attributed to another dx like  Copd/asthma and  include PNDS, "throat congestion" = globus and chest congestion, "bronchitis", unexplained dyspnea and noct "strangling" sensations, and hoarseness, but also  atypical /refractory GERD symptoms like dysphagia and "bad heartburn"   The only way I know  to prove this is not an "ACEi Case" is a trial off ACEi x a minimum of 6 weeks then regroup.   >>> try olmesartan 40 mg one daily and return in 4 weeks if not improving

## 2022-08-09 ENCOUNTER — Telehealth: Payer: Self-pay | Admitting: Internal Medicine

## 2022-08-09 NOTE — Telephone Encounter (Signed)
Katharine Look from [facility] called regarding a surgical clearance that was sent over for patient. He is scheduled for surgery on Friday. She hasn't received it back yet. She was calling to receive the status of the form.

## 2022-08-09 NOTE — Telephone Encounter (Signed)
Left message on VM for Anthony [name] advising I had looked at my desk and Dr Alla German desk and did not see a form. I asked her to fax it to [phone] attn to me so I can see if another provider would be willing to fill it out.

## 2022-08-09 NOTE — Telephone Encounter (Signed)
Received the medical clearance form. It does not request Labs or EKG. Pt is new to [place]. I will print his last OV note and attach. Will find a provider in the office to look at this.

## 2022-08-09 NOTE — Telephone Encounter (Signed)
Please see written note.  Thanks.

## 2022-08-10 NOTE — Telephone Encounter (Signed)
Note from Dr Damita Dunnings and OV note faxed to [facility]. Note from Dr Damita Dunnings has been scanned in the chart.

## 2022-08-11 ENCOUNTER — Encounter: Payer: Self-pay | Admitting: Cardiovascular Disease

## 2022-08-12 ENCOUNTER — Telehealth: Payer: Self-pay | Admitting: Pharmacist

## 2022-08-12 MED ORDER — NEXLIZET 180-10 MG PO TABS: 1.0000 | ORAL_TABLET | Freq: Every day | ORAL | 11 refills | Status: DC

## 2022-08-12 NOTE — Telephone Encounter (Signed)
Pt's wife returned call to clinic. Reports he's tolerating the sample well. Will submit prior auth for Nexlizet.

## 2022-08-12 NOTE — Telephone Encounter (Signed)
Nexlizet prior auth approved through 01/11/23, rx sent to pharmacy, will replace ezetimibe and Nexletol samples once pt finishes course of those.

## 2022-08-12 NOTE — Telephone Encounter (Signed)
Received prior auth request for Nexlizet. Med ordered on 12/15 cards visit and then removed same day at pulm visit.  Cards note 12/15: " I have suggested the addition of bempedoic acid to his Zetia which should help him achieve an LDL less than 70 and I provided him samples of Nexletol 180 mg.  If he tolerates this, he can then be switched to nexlizet which is the combination bempedoic acid/Zetia and 1 pill."  Called pt to see if he is tolerating Nexletol samples in addition to his ezetimibe and left message. If so, will submit PA for Nexlizet. Key in [facility] is [id number].

## 2022-08-17 ENCOUNTER — Encounter: Payer: Self-pay | Admitting: Internal Medicine

## 2022-08-17 ENCOUNTER — Ambulatory Visit (INDEPENDENT_AMBULATORY_CARE_PROVIDER_SITE_OTHER): Payer: PPO | Admitting: Internal Medicine

## 2022-08-17 VITALS — BP 134/78 | HR 77 | Temp 97.6°F | Ht 68.0 in | Wt 289.0 lb

## 2022-08-17 DIAGNOSIS — R052 Subacute cough: Secondary | ICD-10-CM

## 2022-08-17 DIAGNOSIS — K2100 Gastro-esophageal reflux disease with esophagitis, without bleeding: Secondary | ICD-10-CM

## 2022-08-17 DIAGNOSIS — I10 Essential (primary) hypertension: Secondary | ICD-10-CM | POA: Diagnosis not present

## 2022-08-17 MED ORDER — ESOMEPRAZOLE MAGNESIUM 40 MG PO CPDR: 40.0000 mg | DELAYED_RELEASE_CAPSULE | Freq: Two times a day (BID) | ORAL | 3 refills | Status: DC

## 2022-08-17 NOTE — Assessment & Plan Note (Signed)
BP Readings from Last 3 Encounters:  08/17/22 134/78  08/05/22 120/70  08/05/22 110/69   Now on olmesartan 40 instead of ACEI

## 2022-08-17 NOTE — Assessment & Plan Note (Signed)
Had been improving with change to ARB---but now worse again and seems to be related to the change of PPI Will go back to nexium

## 2022-08-17 NOTE — Progress Notes (Signed)
Subjective:    Patient ID: Anthony Bern., male    DOB: 1950-09-07, 71 y.o.   MRN: [id number]  HPI Here due to increased problems with reflux  Did see [name] Taken off ACEI Cough persists--but is some better  Went to GI doctor---[name] retired and PA there changed him from the nexium She felt his voice/cough was related to reflux Now he is having problems with reflux again He would like to see [name] now  Now the cough seems more when lying down or after eating  Current Outpatient Medications on File Prior to Visit  Medication Sig Dispense Refill   amLODipine (NORVASC) 10 MG tablet TAKE 1 TABLET BY MOUTH EVERY DAY IN THE MORNING 90 tablet 3   Bempedoic Acid-Ezetimibe (NEXLIZET) 180-10 MG TABS Take 1 tablet by mouth daily. 30 tablet 11   brimonidine (ALPHAGAN P) 0.1 % SOLN      carvedilol (COREG) 6.25 MG tablet TAKE 1 TABLET BY MOUTH TWICE A DAY 180 tablet 3   dorzolamide-timolol (COSOPT) 22.3-6.8 MG/ML ophthalmic solution Place 2 drops into the right eye 2 (two) times daily.      finasteride (PROSCAR) 5 MG tablet TAKE 1 TABLET BY MOUTH EVERY DAY IN THE MORNING 90 tablet 1   furosemide (LASIX) 40 MG tablet Take 1 tablet (40 mg total) by mouth daily. Take an additional 20 mg (0.5 tablet) for weight again. (3 pounds overnight or 5 pounds in 1 week.) 105 tablet 3   olmesartan (BENICAR) 40 MG tablet Take 1 tablet (40 mg total) by mouth daily. 30 tablet 2   Omega-3 Fatty Acids (FISH OIL PO) Take 1,400 mg by mouth.     pantoprazole (PROTONIX) 40 MG tablet Take 40 mg by mouth 2 (two) times daily.     Polyvinyl Alcohol-Povidone (REFRESH OP) Place 1 drop into the right eye daily as needed (dry eyes/irritation).     spironolactone (ALDACTONE) 25 MG tablet TAKE 1 TABLET (25 MG TOTAL) BY MOUTH DAILY. 90 tablet 3   No current facility-administered medications on file prior to visit.    Allergies  Allergen Reactions   Lipitor [Atorvastatin] Other (See Comments)    Leg pain/cramps;  statin myopathy   Red Yeast Rice [Cholestin] Other (See Comments)    Makes legs hurt, myopathy   Statins Other (See Comments)    "muscle break down", statin myopathy   Statins Support [Acid Blockers Support] Other (See Comments)   Codeine Other (See Comments)    Headache   Latex Rash    Past Medical History:  Diagnosis Date   Benign neoplasm of colon    Benign neoplasm of skin of upper limb, including shoulder    Cancer of skin of external cheek    Carpal tunnel syndrome    Chest pain    a. normal cors by cath in 2015   Diaphragmatic hernia without mention of obstruction or gangrene    Dyslipidemia    Elevated prostate specific antigen (PSA)    GERD (gastroesophageal reflux disease)    Herpes zoster without mention of complication    History of cancer of external ear    History of hiatal hernia    Hypertension    Hypertrophy of prostate with urinary obstruction and other lower urinary tract symptoms (LUTS)    Impotence of organic origin    Lumbago    Obesity, unspecified    Osteoarthrosis, unspecified whether generalized or localized, lower leg    Other and unspecified hyperlipidemia  Other malaise and fatigue    Other seborrheic keratosis    Special screening for malignant neoplasm of prostate    Stenosis, cervical spine    Unspecified tinnitus     Past Surgical History:  Procedure Laterality Date   ANTERIOR CERVICAL DECOMP/DISCECTOMY FUSION N/A 02/19/2018   Procedure: ANTERIOR CERVICAL DECOMPRESSION/DISCECTOMY FUSION - CERVICAL FOUR-CERVICAL FIVE - CERVICAL FIVE-CERVICAL SIX;  Surgeon: Earnie Larsson, MD;  Location: [facility];  Service: Neurosurgery;  Laterality: N/A;   CARDIOVASCULAR STRESS TEST  06/15/2012   Non-diagnostic for ischemia. No lexiscan EKG changes.   CHOLECYSTECTOMY  1992   EYE SURGERY     eyeband  2006   removal of right eyeband   INGUINAL HERNIA REPAIR  1984   left   INTRAOCULAR LENS REMOVAL  2008   right   LEFT HEART CATHETERIZATION WITH CORONARY  ANGIOGRAM N/A 09/05/2013   Procedure: LEFT HEART CATHETERIZATION WITH CORONARY ANGIOGRAM;  Surgeon: Troy Sine, MD;  Location: [facility] CATH LAB;  Service: Cardiovascular;  Laterality: N/A;   LOWER VENOUS EXTREMITY DOPPLER  01/26/2011   No evidence of thrombus or thrombophlebitis.   RETINAL DETACHMENT SURGERY  2001   right   TOTAL HIP ARTHROPLASTY  2012   bilateral   TOTAL KNEE ARTHROPLASTY  2011, 2012   bilateral   TRANSTHORACIC ECHOCARDIOGRAM  10/05/2009   EF >55%, normal LV size, systolic, and diastolic function.    Family History  Problem Relation Age of Onset   Diabetes Mother    Arrhythmia Mother    Hypertension Mother    Dementia Father    Pulmonary embolism Father    Hypertension Father    Cancer Sister        Liver cancer - Histocytic lymphoma   Cancer Sister 61       Skin cancer   Hypertension Sister 85   Diabetes Sister    Stroke Brother    Diabetes Brother    Hypertension Brother    Cancer Daughter    Cancer Maternal Grandmother        Skin cancer   Heart attack Maternal Grandfather    Cancer Paternal Grandfather    Hypertension Paternal Grandfather     Social History   Socioeconomic History   Marital status: Married    Spouse name: Not on file   Number of children: Not on file   Years of education: Not on file   Highest education level: Not on file  Occupational History   Occupation: retired Medical sales representative     Comment: Hospital doctor  Tobacco Use   Smoking status: Never    Passive exposure: Past   Smokeless tobacco: Never  Vaping Use   Vaping Use: Never used  Substance and Sexual Activity   Alcohol use: No   Drug use: No   Sexual activity: Yes    Partners: Female    Comment: wife  Other Topics Concern   Not on file  Social History Narrative   Married   Never smoked   Alcohol none   Exercise -gym 3 days a week (join 3 months ago)   Living Will   Social Determinants of Health   Financial Resource Strain: Low Risk  (10/31/2017)   Overall  Financial Resource Strain (CARDIA)    Difficulty of Paying Living Expenses: Not hard at all  Food Insecurity: No Food Insecurity (10/31/2017)   Hunger Vital Sign    Worried About Running Out of Food in the Last Year: Never true    Ran  Out of Food in the Last Year: Never true  Transportation Needs: No Transportation Needs (10/31/2017)   PRAPARE - Hydrologist (Medical): No    Lack of Transportation (Non-Medical): No  Physical Activity: Insufficiently Active (10/31/2017)   Exercise Vital Sign    Days of Exercise per Week: 2 days    Minutes of Exercise per Session: 60 min  Stress: No Stress Concern Present (10/31/2017)   [place]    Feeling of Stress : Only a little  Social Connections: Moderately Integrated (10/31/2017)   Social Connection and Isolation Panel [NHANES]    Frequency of Communication with Friends and Family: Twice a week    Frequency of Social Gatherings with Friends and Family: Twice a week    Attends Religious Services: More than 4 times per year    Active Member of [employer] or Organizations: No    Attends Archivist Meetings: Never    Marital Status: Married  Human resources officer Violence: Not At Risk (10/31/2017)   Humiliation, Afraid, Rape, and Kick questionnaire    Fear of Current or Ex-Partner: No    Emotionally Abused: No    Physically Abused: No    Sexually Abused: No   Review of Systems No chest pain or SOB    Objective:   Physical Exam Constitutional:      Appearance: Normal appearance.  HENT:     Mouth/Throat:     Pharynx: No oropharyngeal exudate or posterior oropharyngeal erythema.  Pulmonary:     Effort: Pulmonary effort is normal.     Breath sounds: Normal breath sounds. No wheezing or rales.  Musculoskeletal:     Cervical back: Neck supple.  Lymphadenopathy:     Cervical: No cervical adenopathy.  Neurological:     Mental Status: He is alert.             Assessment & Plan:

## 2022-08-17 NOTE — Assessment & Plan Note (Signed)
Will change back to nexium 40 bid since cough and reflux symptoms have flared on the pantoprazole Refer to [name] for ongoing care

## 2022-08-18 ENCOUNTER — Encounter: Payer: Self-pay | Admitting: *Deleted

## 2022-08-25 ENCOUNTER — Encounter: Payer: PPO | Admitting: Nurse Practitioner

## 2022-09-01 ENCOUNTER — Encounter: Payer: PPO | Admitting: Nurse Practitioner

## 2022-09-01 ENCOUNTER — Other Ambulatory Visit: Payer: Self-pay | Admitting: Internal Medicine

## 2022-09-09 ENCOUNTER — Other Ambulatory Visit: Payer: Self-pay | Admitting: Family Medicine

## 2022-09-12 ENCOUNTER — Encounter: Payer: Self-pay | Admitting: Internal Medicine

## 2022-09-12 ENCOUNTER — Ambulatory Visit (INDEPENDENT_AMBULATORY_CARE_PROVIDER_SITE_OTHER): Payer: PPO | Admitting: Internal Medicine

## 2022-09-12 VITALS — BP 110/70 | HR 78 | Temp 97.6°F | Ht 68.0 in | Wt 286.0 lb

## 2022-09-12 DIAGNOSIS — T466X5A Adverse effect of antihyperlipidemic and antiarteriosclerotic drugs, initial encounter: Secondary | ICD-10-CM

## 2022-09-12 DIAGNOSIS — K2100 Gastro-esophageal reflux disease with esophagitis, without bleeding: Secondary | ICD-10-CM | POA: Diagnosis not present

## 2022-09-12 DIAGNOSIS — R490 Dysphonia: Secondary | ICD-10-CM | POA: Insufficient documentation

## 2022-09-12 DIAGNOSIS — I5032 Chronic diastolic (congestive) heart failure: Secondary | ICD-10-CM | POA: Insufficient documentation

## 2022-09-12 DIAGNOSIS — I10 Essential (primary) hypertension: Secondary | ICD-10-CM

## 2022-09-12 DIAGNOSIS — Z0000 Encounter for general adult medical examination without abnormal findings: Secondary | ICD-10-CM | POA: Diagnosis not present

## 2022-09-12 DIAGNOSIS — G720 Drug-induced myopathy: Secondary | ICD-10-CM

## 2022-09-12 NOTE — Assessment & Plan Note (Signed)
I have personally reviewed the Medicare Annual Wellness questionnaire and have noted 1. The patient's medical and social history 2. Their use of alcohol, tobacco or illicit drugs 3. Their current medications and supplements 4. The patient's functional ability including ADL's, fall risks, home safety risks and hearing or visual             impairment. 5. Diet and physical activities 6. Evidence for depression or mood disorders  The patients weight, height, BMI and visual acuity have been recorded in the chart I have made referrals, counseling and provided education to the patient based review of the above and I have provided the pt with a written personalized care plan for preventive services.  I have provided you with a copy of your personalized plan for preventive services. Please take the time to review along with your updated medication list.  Done with screening colons (normal 2022) No more PSAs due to age Discussed exercise Prefers no updated COVID vaccine for now Consider shingrix at pharmacy

## 2022-09-12 NOTE — Assessment & Plan Note (Signed)
BMI 43 Discussed cutting out sugar and carbs Needs to start exercise

## 2022-09-12 NOTE — Assessment & Plan Note (Signed)
Probably from GERD Never smoker ENT evaluation just to confirm nothing else

## 2022-09-12 NOTE — Assessment & Plan Note (Signed)
Does have some PND--but no clear exacerbation Discussed weight based furosemide Carvedilol 6.25 bid, olmesartan 40 daily, sprionolactone 25 daily

## 2022-09-12 NOTE — Progress Notes (Signed)
Hearing Screening - Comments:: Passed whisper test Vision Screening - Comments:: November 2023  

## 2022-09-12 NOTE — Progress Notes (Signed)
Subjective:    Patient ID: [name]., male    DOB: Nov 27, 1950, 72 y.o.   MRN: [id number]  HPI Here for Medicare wellness visit and follow up of chronic health conditions Reviewed advanced directives Reviewed other doctors--Dr Wert--pulmonary, Dr Polo Riley, Dr Kelly--cardiology, Dr Puccini's PA (he retired)--GI, Dr Jacqulyn Ducking, new dermatologist No hospitalizations. Only surgery was replacement of right 2nd MCP Vision is good Hearing is good No falls No depression or anhedonia Not exercising--knows he needs to Independent with instrumental ADLs No sig memory issues  Still having problems with voice--especially in the evening Back on nexium has helped--is on bid Cough is better Breathing is better  No chest pain Occasional racing heart--brief. At rest. ?10 minutes No edema on the fluid pill (unless prolonged feet down) Occasionally awakens with dyspnea at night---goes to chair Does weigh daily  BMI 43 Tries to be careful with carbs  Voids okay Flow is okay--seems to empty okay  Current Outpatient Medications on File Prior to Visit  Medication Sig Dispense Refill   amLODipine (NORVASC) 10 MG tablet TAKE 1 TABLET BY MOUTH EVERY DAY IN THE MORNING 90 tablet 3   aspirin EC 81 MG tablet Take 81 mg by mouth daily. Swallow whole.     Bempedoic Acid-Ezetimibe (NEXLIZET) 180-10 MG TABS Take 1 tablet by mouth daily. 30 tablet 11   brimonidine (ALPHAGAN P) 0.1 % SOLN      carvedilol (COREG) 6.25 MG tablet TAKE 1 TABLET BY MOUTH TWICE A DAY 180 tablet 3   dorzolamide-timolol (COSOPT) 22.3-6.8 MG/ML ophthalmic solution Place 2 drops into the right eye 2 (two) times daily.      esomeprazole (NEXIUM) 40 MG capsule Take 1 capsule (40 mg total) by mouth 2 (two) times daily before a meal. 180 capsule 3   finasteride (PROSCAR) 5 MG tablet TAKE 1 TABLET BY MOUTH EVERY DAY IN THE MORNING 90 tablet 1   furosemide (LASIX) 40 MG tablet TAKE 1 DAILY. TAKE AN ADDITIONAL 20 MG FOR  WEIGHT AGAIN. (3 POUNDS OVERNIGHT OR 5 POUNDS IN 1 WEEK.) 105 tablet 3   olmesartan (BENICAR) 40 MG tablet Take 1 tablet (40 mg total) by mouth daily. 30 tablet 2   Omega-3 Fatty Acids (FISH OIL PO) Take 1,400 mg by mouth.     Polyvinyl Alcohol-Povidone (REFRESH OP) Place 1 drop into the right eye daily as needed (dry eyes/irritation).     spironolactone (ALDACTONE) 25 MG tablet TAKE 1 TABLET (25 MG TOTAL) BY MOUTH DAILY. 90 tablet 3   No current facility-administered medications on file prior to visit.    Allergies  Allergen Reactions   Lipitor [Atorvastatin] Other (See Comments)    Leg pain/cramps; statin myopathy   Red Yeast Rice [Cholestin] Other (See Comments)    Makes legs hurt, myopathy   Statins Other (See Comments)    "muscle break down", statin myopathy   Statins Support [Acid Blockers Support] Other (See Comments)   Codeine Other (See Comments)    Headache   Latex Rash    Past Medical History:  Diagnosis Date   Benign neoplasm of colon    Benign neoplasm of skin of upper limb, including shoulder    Cancer of skin of external cheek    Carpal tunnel syndrome    Chest pain    a. normal cors by cath in 2015   Chronic diastolic HF (heart failure) (HCC)    Diaphragmatic hernia without mention of obstruction or gangrene    Dyslipidemia  Elevated prostate specific antigen (PSA)    GERD (gastroesophageal reflux disease)    Herpes zoster without mention of complication    History of cancer of external ear    History of hiatal hernia    Hypertension    Hypertrophy of prostate with urinary obstruction and other lower urinary tract symptoms (LUTS)    Impotence of organic origin    Lumbago    Obesity, unspecified    Osteoarthrosis, unspecified whether generalized or localized, lower leg    Other and unspecified hyperlipidemia    Other seborrheic keratosis    Stenosis, cervical spine    Unspecified tinnitus     Past Surgical History:  Procedure Laterality Date    ANTERIOR CERVICAL DECOMP/DISCECTOMY FUSION N/A 02/19/2018   Procedure: ANTERIOR CERVICAL DECOMPRESSION/DISCECTOMY FUSION - CERVICAL FOUR-CERVICAL FIVE - CERVICAL FIVE-CERVICAL SIX;  Surgeon: Earnie Larsson, MD;  Location: [facility];  Service: Neurosurgery;  Laterality: N/A;   CARDIOVASCULAR STRESS TEST  06/15/2012   Non-diagnostic for ischemia. No lexiscan EKG changes.   CHOLECYSTECTOMY  1992   EYE SURGERY     eyeband  2006   removal of right eyeband   INGUINAL HERNIA REPAIR  1984   left   INTRAOCULAR LENS REMOVAL  2008   right   LEFT HEART CATHETERIZATION WITH CORONARY ANGIOGRAM N/A 09/05/2013   Procedure: LEFT HEART CATHETERIZATION WITH CORONARY ANGIOGRAM;  Surgeon: Troy Sine, MD;  Location: [facility] CATH LAB;  Service: Cardiovascular;  Laterality: N/A;   LOWER VENOUS EXTREMITY DOPPLER  01/26/2011   No evidence of thrombus or thrombophlebitis.   RETINAL DETACHMENT SURGERY  2001   right   TOTAL HIP ARTHROPLASTY  2012   bilateral   TOTAL KNEE ARTHROPLASTY  2011, 2012   bilateral   TRANSTHORACIC ECHOCARDIOGRAM  10/05/2009   EF >55%, normal LV size, systolic, and diastolic function.    Family History  Problem Relation Age of Onset   Diabetes Mother    Arrhythmia Mother    Hypertension Mother    Dementia Father    Pulmonary embolism Father    Hypertension Father    Cancer Sister        Liver cancer - Histocytic lymphoma   Cancer Sister 58       Skin cancer   Hypertension Sister 93   Diabetes Sister    Stroke Brother    Diabetes Brother    Hypertension Brother    Cancer Daughter    Cancer Maternal Grandmother        Skin cancer   Heart attack Maternal Grandfather    Cancer Paternal Grandfather    Hypertension Paternal Grandfather     Social History   Socioeconomic History   Marital status: Married    Spouse name: Not on file   Number of children: 2   Years of education: Not on file   Highest education level: Not on file  Occupational History   Occupation: Medical sales representative  --retired    Fish farm manager: [employer]    Comment: And Personal assistant  Tobacco Use   Smoking status: Never    Passive exposure: Past   Smokeless tobacco: Never  Vaping Use   Vaping Use: Never used  Substance and Sexual Activity   Alcohol use: No   Drug use: No   Sexual activity: Yes    Partners: Female    Comment: wife  Other Topics Concern   Not on file  Social History Narrative   Daughter and son      Living Will  Wife is health care POA--daughter is alternate   Would accept resuscitation attempts--but no prolonged ventilation or tube feeds   Social Determinants of Health   Financial Resource Strain: Low Risk  (10/31/2017)   Overall Financial Resource Strain (CARDIA)    Difficulty of Paying Living Expenses: Not hard at all  Food Insecurity: No Food Insecurity (10/31/2017)   Hunger Vital Sign    Worried About Running Out of Food in the Last Year: Never true    Ran Out of Food in the Last Year: Never true  Transportation Needs: No Transportation Needs (10/31/2017)   PRAPARE - Hydrologist (Medical): No    Lack of Transportation (Non-Medical): No  Physical Activity: Insufficiently Active (10/31/2017)   Exercise Vital Sign    Days of Exercise per Week: 2 days    Minutes of Exercise per Session: 60 min  Stress: No Stress Concern Present (10/31/2017)   [place]    Feeling of Stress : Only a little  Social Connections: Moderately Integrated (10/31/2017)   Social Connection and Isolation Panel [NHANES]    Frequency of Communication with Friends and Family: Twice a week    Frequency of Social Gatherings with Friends and Family: Twice a week    Attends Religious Services: More than 4 times per year    Active Member of [employer] or Organizations: No    Attends Archivist Meetings: Never    Marital Status: Married  Human resources officer Violence: Not At Risk (10/31/2017)   Humiliation, Afraid,  Rape, and Kick questionnaire    Fear of Current or Ex-Partner: No    Emotionally Abused: No    Physically Abused: No    Sexually Abused: No   Review of Systems Appetite fair Weight stable Sleeps okay Wears seat belt Teeth are good--sees dentist No suspicious skin lesions now Bowels move fine--no blood Some general arthritis--ibuprofen if really bad    Objective:   Physical Exam Constitutional:      Appearance: Normal appearance.  HENT:     Mouth/Throat:     Pharynx: No oropharyngeal exudate or posterior oropharyngeal erythema.  Eyes:     Conjunctiva/sclera: Conjunctivae normal.     Pupils: Pupils are equal, round, and reactive to light.  Cardiovascular:     Rate and Rhythm: Normal rate and regular rhythm.     Pulses: Normal pulses.     Heart sounds:     No gallop.  Pulmonary:     Effort: Pulmonary effort is normal.     Breath sounds: Normal breath sounds. No wheezing or rales.  Abdominal:     Palpations: Abdomen is soft.     Tenderness: There is no abdominal tenderness.     Comments: Small reducible umbilical hernia  Musculoskeletal:     Cervical back: Neck supple.     Right lower leg: No edema.     Left lower leg: No edema.  Lymphadenopathy:     Cervical: No cervical adenopathy.  Skin:    Findings: No lesion or rash.  Neurological:     General: No focal deficit present.     Mental Status: He is alert and oriented to person, place, and time.     Comments: Word naming 6/1 minute Recall 3/3  Psychiatric:        Mood and Affect: Mood normal.        Behavior: Behavior normal.  Assessment & Plan:

## 2022-09-12 NOTE — Assessment & Plan Note (Signed)
Doing okay with bempedoic acid/ezetimibe

## 2022-09-12 NOTE — Assessment & Plan Note (Signed)
Ongoing issues despite nexium 40 bid Will set up with ENT due the hoarseness

## 2022-09-12 NOTE — Assessment & Plan Note (Signed)
BP Readings from Last 3 Encounters:  09/12/22 110/70  08/17/22 134/78  08/05/22 120/70   Okay with heart meds plus amlodipine '10mg'$  daily

## 2022-10-04 ENCOUNTER — Ambulatory Visit: Payer: PPO | Admitting: Family Medicine

## 2022-10-05 ENCOUNTER — Ambulatory Visit: Payer: PPO | Admitting: Family Medicine

## 2022-10-28 ENCOUNTER — Other Ambulatory Visit: Payer: Self-pay | Admitting: Cardiovascular Disease

## 2022-10-28 ENCOUNTER — Other Ambulatory Visit: Payer: Self-pay | Admitting: Medical

## 2022-10-28 DIAGNOSIS — E785 Hyperlipidemia, unspecified: Secondary | ICD-10-CM

## 2022-10-28 DIAGNOSIS — R0609 Other forms of dyspnea: Secondary | ICD-10-CM

## 2022-10-28 DIAGNOSIS — I5189 Other ill-defined heart diseases: Secondary | ICD-10-CM

## 2022-10-28 DIAGNOSIS — I2510 Atherosclerotic heart disease of native coronary artery without angina pectoris: Secondary | ICD-10-CM

## 2022-10-28 DIAGNOSIS — I10 Essential (primary) hypertension: Secondary | ICD-10-CM

## 2022-11-01 ENCOUNTER — Other Ambulatory Visit: Payer: Self-pay | Admitting: Internal Medicine

## 2022-11-03 ENCOUNTER — Encounter: Payer: Self-pay | Admitting: Internal Medicine

## 2022-11-03 ENCOUNTER — Ambulatory Visit (INDEPENDENT_AMBULATORY_CARE_PROVIDER_SITE_OTHER): Payer: PPO | Admitting: Internal Medicine

## 2022-11-03 VITALS — BP 132/84 | HR 82 | Ht 68.0 in | Wt 289.2 lb

## 2022-11-03 DIAGNOSIS — I10 Essential (primary) hypertension: Secondary | ICD-10-CM

## 2022-11-03 DIAGNOSIS — R058 Other specified cough: Secondary | ICD-10-CM

## 2022-11-03 MED ORDER — OLMESARTAN MEDOXOMIL 40 MG PO TABS: 40.0000 mg | ORAL_TABLET | Freq: Every day | ORAL | 3 refills | Status: DC

## 2022-11-03 NOTE — Progress Notes (Signed)
Anthony [name]., male    DOB: January 26, 1951   MRN: [id number]   Brief patient profile:  18   yowm never smoker wm referred to pulmonary clinic in [facility]  08/05/2022 by anesthesiology for preop eval for cough x one year assoc with mild doe.       History of Present Illness  08/05/2022  Pulmonary/ 1st office eval/ Ally Knodel / [employer] on ACEi  Chief Complaint  Patient presents with   Consult  Dyspnea:  worse over the last year prior to OV  but limited anyway to a block or two due hips and knees s/p multiple joint replacement but no h/o RA  Cough: p eating before get up/ choking sensation no better with egd (the sensation is in the throat, not in the chest) and still having overt hb on ppi though not ac / tessalon helps the cough  Sleep: adjustable to 30 degrees / no cough noct  SABA use: none  Rec Nexium 40 mg should be Take 30- 60 min before your first and last meals of the day  Stop lisinopril and the cough should improve in 3 days but if not ok use tessalon 200 mg up to 3 x daily if needed GERD diet    11/03/2022  f/u ov/[place] [facility]/Dameian Crisman re: uacs  maint on  longterm (baseline) gerd rx and off acei   Chief Complaint  Patient presents with   Follow-up    Needs refill on olmesartan.  Cough and DOE have improved since stopping lisinopril  Dyspnea:  only on fast walk / no routine ex due to hip limits Cough: resolved / no more choking  Sleeping: 30 degrees ok  SABA use: none  02: none   No obvious day to day or daytime variability or assoc excess/ purulent sputum or mucus plugs or hemoptysis or cp or chest tightness, subjective wheeze or overt sinus or hb symptoms.   Sleeping now  without nocturnal  or early am exacerbation  of respiratory  c/o's or need for noct saba. Also denies any obvious fluctuation of symptoms with weather or environmental changes or other aggravating or alleviating factors except as outlined above   No unusual exposure hx or h/o childhood pna/  asthma or knowledge of premature birth.  Current Allergies, Complete Past Medical History, Past Surgical History, Family History, and Social History were reviewed in [employer] record.  ROS  The following are not active complaints unless bolded Hoarseness, sore throat, dysphagia, dental problems, itching, sneezing,  nasal congestion or discharge of excess mucus or purulent secretions, ear ache,   fever, chills, sweats, unintended wt loss or wt gain, classically pleuritic or exertional cp,  orthopnea pnd or arm/hand swelling  or leg swelling, presyncope, palpitations, abdominal pain, anorexia, nausea, vomiting, diarrhea  or change in bowel habits or change in bladder habits, change in stools or change in urine, dysuria, hematuria,  rash, arthralgias, visual complaints, headache, numbness, weakness or ataxia or problems with walking or coordination,  change in mood or  memory.        Current Meds  Medication Sig   amLODipine (NORVASC) 10 MG tablet TAKE 1 TABLET BY MOUTH EVERY DAY IN THE MORNING   aspirin EC 81 MG tablet Take 81 mg by mouth daily. Swallow whole.   Bempedoic Acid-Ezetimibe (NEXLIZET) 180-10 MG TABS Take 1 tablet by mouth daily.   brimonidine (ALPHAGAN P) 0.1 % SOLN    carvedilol (COREG) 6.25 MG tablet TAKE 1 TABLET  BY MOUTH TWICE A DAY   dorzolamide-timolol (COSOPT) 22.3-6.8 MG/ML ophthalmic solution Place 2 drops into the right eye 2 (two) times daily.    esomeprazole (NEXIUM) 40 MG capsule Take 1 capsule (40 mg total) by mouth 2 (two) times daily before a meal.   finasteride (PROSCAR) 5 MG tablet TAKE 1 TABLET BY MOUTH EVERY DAY IN THE MORNING   furosemide (LASIX) 40 MG tablet TAKE 1 DAILY. TAKE AN ADDITIONAL 20 MG FOR WEIGHT AGAIN. (3 POUNDS OVERNIGHT OR 5 POUNDS IN 1 WEEK.)   olmesartan (BENICAR) 40 MG tablet Take 1 tablet (40 mg total) by mouth daily.   Omega-3 Fatty Acids (FISH OIL PO) Take 1,400 mg by mouth.   Polyvinyl Alcohol-Povidone (REFRESH OP)  Place 1 drop into the right eye daily as needed (dry eyes/irritation).   spironolactone (ALDACTONE) 25 MG tablet TAKE 1 TABLET (25 MG TOTAL) BY MOUTH DAILY.                   Past Medical History:  Diagnosis Date   Benign neoplasm of colon    Benign neoplasm of skin of upper limb, including shoulder    Cancer of skin of external cheek    Carpal tunnel syndrome    Chest pain    a. normal cors by cath in 2015   Diaphragmatic hernia without mention of obstruction or gangrene    Dyslipidemia    Elevated prostate specific antigen (PSA)    GERD (gastroesophageal reflux disease)    Herpes zoster without mention of complication    History of cancer of external ear    History of hiatal hernia    Hypertension    Hypertrophy of prostate with urinary obstruction and other lower urinary tract symptoms (LUTS)    Impotence of organic origin    Lumbago    Obesity, unspecified    Osteoarthrosis, unspecified whether generalized or localized, lower leg    Other and unspecified hyperlipidemia    Other malaise and fatigue    Other seborrheic keratosis    Special screening for malignant neoplasm of prostate    Stenosis, cervical spine    Unspecified tinnitus         Objective:    Wt Readings from Last 3 Encounters:  11/03/22 289 lb 3.2 oz (131.2 kg)  09/12/22 286 lb (129.7 kg)  08/17/22 289 lb (131.1 kg)      Vital signs reviewed  11/03/2022  - Note at rest 02 sats  96% on RA   General appearance:    amb   MO (by bmi) wm nad    HEENT : Oropharynx  clear         NECK :  without  apparent JVD/ palpable Nodes/TM    LUNGS: no acc muscle use,  Nl contour chest which is clear to A and P bilaterally without cough on insp or exp maneuvers   CV:  RRR  no s3 or murmur or increase in P2, and no edema   ABD: obese  soft and nontender    MS:  Nl gait/ ext warm without deformities Or obvious joint restrictions  calf tenderness, cyanosis or clubbing    SKIN: warm and dry without lesions     NEURO:  alert, approp, nl sensorium with  no motor or cerebellar deficits apparent.       Assessment

## 2022-11-03 NOTE — Assessment & Plan Note (Signed)
Onset 2022 in pt with chronic GERD on ACEi  - d/c acei 08/05/2022 and max rx for GERD / tessalon prn > resolved as of 11/03/2022 as were his choking sensations > advised no more acei/ resume gerd rx as per PCP recs   Upper airway cough syndrome (previously labeled PNDS),  is so named because it's frequently impossible to sort out how much is  CR/sinusitis with freq throat clearing (which can be related to primary GERD)   vs  causing  secondary (" extra esophageal")  GERD from wide swings in gastric pressure that occur with throat clearing, often  promoting self use of mint and menthol lozenges that reduce the lower esophageal sphincter tone and exacerbate the problem further in a cyclical fashion.   These are the same pts (now being labeled as having "irritable larynx syndrome" by some cough centers) who not infrequently have a history of having failed to tolerate ace inhibitors,  dry powder inhalers or biphosphonates or report having atypical/extraesophageal reflux symptoms that don't respond to standard doses of PPI  and are easily confused as having aecopd or asthma flares by even experienced allergists/ pulmonologists (myself included).   Can keep working on wt loss and gerd diet/ rx per PCP as was the case prior to pulmonary eval.  F/u in pulmonary clinic prn

## 2022-11-03 NOTE — Patient Instructions (Addendum)
If you are satisfied with your treatment plan,  let your doctor know and he/she can either refill your medications or you can return here when your prescription runs out.     If in any way you are not 100% satisfied,  please tell us.  If 100% better, tell your friends!  Pulmonary follow up is as needed  4

## 2022-11-03 NOTE — Assessment & Plan Note (Addendum)
D/c acei 08/05/2022  due to cough/dysphagia/choking resolved   Although even in retrospect it may not be clear the ACEi contributed to the pt's symptoms,  Pt improved off them and adding them back at this point or in the future would risk confusion in interpretation of non-specific respiratory symptoms to which this patient is prone  ie  Better not to muddy the waters here.   >>> benicar 40 mg daily and  has f/u with Dr Claiborne Billings with [facility]  11/11/22 (note on aldactone and NSAIDs so needs continued renal function monitoring/ advised)

## 2022-11-03 NOTE — Assessment & Plan Note (Signed)
Body mass index is 43.97 kg/m.  -  trending up Lab Results  Component Value Date   TSH 3.27 11/25/2019      Contributing to doe and risk of GERD >>>   reviewed the need and the process to achieve and maintain neg calorie balance > defer f/u primary care including intermittently monitoring thyroid status     Due to orthopedic limitations strongly rec water aerobics to improve calorie balance  Pulmonary f/u prn  Each maintenance medication was reviewed in detail including emphasizing most importantly the difference between maintenance and prns and under what circumstances the prns are to be triggered using an action plan format where appropriate.  Total time for H and P, chart review, counseling,  and generating customized AVS unique to this office visit / same day charting = 30 min summary final f/u ov

## 2022-11-07 ENCOUNTER — Ambulatory Visit: Payer: PPO | Admitting: Primary Care

## 2022-11-08 ENCOUNTER — Other Ambulatory Visit: Payer: Self-pay

## 2022-11-08 DIAGNOSIS — E785 Hyperlipidemia, unspecified: Secondary | ICD-10-CM

## 2022-11-09 LAB — COMPREHENSIVE METABOLIC PANEL
ALT: 25 IU/L (ref 0–44)
AST: 24 IU/L (ref 0–40)
Albumin/Globulin Ratio: 3.3 — ABNORMAL HIGH (ref 1.2–2.2)
Albumin: 4.3 g/dL (ref 3.8–4.8)
Alkaline Phosphatase: 82 IU/L (ref 44–121)
BUN/Creatinine Ratio: 16 (ref 10–24)
BUN: 16 mg/dL (ref 8–27)
Bilirubin Total: 0.4 mg/dL (ref 0.0–1.2)
CO2: 23 mmol/L (ref 20–29)
Calcium: 9.3 mg/dL (ref 8.6–10.2)
Chloride: 108 mmol/L — ABNORMAL HIGH (ref 96–106)
Creatinine, Ser: 0.98 mg/dL (ref 0.76–1.27)
Globulin, Total: 1.3 g/dL — ABNORMAL LOW (ref 1.5–4.5)
Glucose: 94 mg/dL (ref 70–99)
Potassium: 4.4 mmol/L (ref 3.5–5.2)
Sodium: 146 mmol/L — ABNORMAL HIGH (ref 134–144)
Total Protein: 5.6 g/dL — ABNORMAL LOW (ref 6.0–8.5)
eGFR: 81 mL/min/{1.73_m2} (ref >59)

## 2022-11-09 LAB — LIPID PANEL
Chol/HDL Ratio: 3.3 ratio (ref 0.0–5.0)
Cholesterol, Total: 127 mg/dL (ref 100–199)
HDL: 38 mg/dL — ABNORMAL LOW (ref >39)
LDL Chol Calc (NIH): 60 mg/dL (ref 0–99)
Triglycerides: 171 mg/dL — ABNORMAL HIGH (ref 0–149)
VLDL Cholesterol Cal: 29 mg/dL (ref 5–40)

## 2022-11-09 LAB — LIPOPROTEIN A (LPA): Lipoprotein (a): 29.4 nmol/L (ref <75.0)

## 2022-11-11 ENCOUNTER — Ambulatory Visit: Payer: PPO | Attending: Cardiovascular Disease | Admitting: Cardiovascular Disease

## 2022-11-11 ENCOUNTER — Encounter: Payer: Self-pay | Admitting: Cardiovascular Disease

## 2022-11-11 VITALS — BP 122/62 | HR 75 | Ht 68.0 in | Wt 289.4 lb

## 2022-11-11 DIAGNOSIS — I10 Essential (primary) hypertension: Secondary | ICD-10-CM | POA: Diagnosis not present

## 2022-11-11 DIAGNOSIS — R600 Localized edema: Secondary | ICD-10-CM

## 2022-11-11 DIAGNOSIS — I5189 Other ill-defined heart diseases: Secondary | ICD-10-CM

## 2022-11-11 DIAGNOSIS — E785 Hyperlipidemia, unspecified: Secondary | ICD-10-CM

## 2022-11-11 DIAGNOSIS — R0683 Snoring: Secondary | ICD-10-CM

## 2022-11-11 DIAGNOSIS — R0609 Other forms of dyspnea: Secondary | ICD-10-CM | POA: Diagnosis not present

## 2022-11-11 DIAGNOSIS — E782 Mixed hyperlipidemia: Secondary | ICD-10-CM | POA: Diagnosis not present

## 2022-11-11 DIAGNOSIS — R400 Somnolence: Secondary | ICD-10-CM

## 2022-11-11 DIAGNOSIS — Z789 Other specified health status: Secondary | ICD-10-CM

## 2022-11-11 NOTE — Progress Notes (Unsigned)
Patient ID: Anthony Torti., male   DOB: March 27, 1951, 72 y.o.   MRN: [id number]       HPI: Anthony [name]. is a 72 y.o. male who presents to the office for a 12 month follow-up cardiology evaluation.   Anthony [name] has a history of hypertension, mild obesity, as well as hyperlipidemia. In the past, he developed CPK elevation secondary to statin therapy. He has been able to tolerate Zetia. A nuclear perfusion study done in October 2013 for chest pain showed normal perfusion without scar or ischemia.  Laboratory in October 2014 showed cholesterol 196, triglycerides significantly elevated at 291, HDL of 34, VLDL increased at 58, and  LDL 106. Renal function was normal. BUN 15 kerning 0.79.   He developed recurrent episodes of chest pain for which he saw Anthony [name] and because of exertionally precipitated episodes of discomfort definitive cardiac catheterization was recommended. This was done by me on 09/05/2013 and revealed normal coronary arteries with normal LV function without focal segmental wall motion abnormalities. Ejection fraction was 55%.  He underwent an echo Doppler study on 09/04/2013 which showed an ejection fraction of 55-60%. He did have grade 1 diastolic dysfunction and mild left ventricular hypertrophy. He had mild mitral annular calcification with mildly thickened leaflets and trivial mitral regurgitation. A moderate pressure estimate was 27 mm.  He was  seen by Anthony [name] on 08/17/2016 with complaints of chest discomfort.  He's episodes would occur often when he was walking on his driveway her in the grocery store and had been ongoing for 1-2 months.  He had diffuse tenderness to palpation along his left pectoral region.  On exam it was felt that some of his chest pain was musculoskeletal in etiology.  He was hypertensive and she further titrated amlodipine.  Because of exertional dyspnea.  She referred him for an echo Doppler study which was done on 08/23/2016.  This showed  mild LVH with normal systolic function and grade 1 diastolic dysfunction.  There is very mild increased peak Anthony [name] pressure 34 mm.  I saw him in January 2019 after not having seen him since February 2015.  At that time, his pressure was elevated despite taking amlodipine 7.5 mg.  I recommended further titration to 10 mg daily.  On this increased dose, he states his blood pressure at home typically has been running in the 130-135 range.  He denies chest pain PND orthopnea.  He has a history of GERD which is controlled with Nexium.  He has had some issues with left arm paresthesias and was diagnosed with significant cervical degenerative disc disease.  He is scheduled to undergo neck surgery on December 12, 2017 by Anthony [name].  Preoperative surgical clearance was recommended.  He denies any chest pain or shortness of breath.  He has not been exercising as he had in the past in the winter months but hopes to do so following his surgery and with improved weather.    When I saw him in April 2019 he was given surgical clearance for his cervical surgery.  He underwent successful surgery by Anthony [name] involving C4-5 and C5-6 and tolerated this well.   I saw him in January 2020 at that time he had begun to notice some episodes of chest wall discomfort.  He denied any clear-cut exertional precipitation and noted the chest discomfort intermittently.  He was unable to walk well secondary to bilateral knee and hip replacement surgery.  He also had experience of mild exertional shortness of breath.  I felt most likely that his chest pain was of musculoskeletal etiology.  However with his exertional dyspnea I recommended reevaluation of his coronary anatomy and suggested CT coronary angiography and also a 2-year follow-up echo Doppler assessment.  His echo study was done on August 30, 2018 and showed an EF of 60 to 65% without wall motion abnormalities.  There was grade 2 diastolic dysfunction.   There was mild increased Anthony [name] pressure at 35 mm.  There was mild MR.  Coronary CTA yielded calcium score of 26.4 which was 20th percentile for age and sex matched control.  He had normal coronary origin with right dominance.  There was minimal plaque in the ostial LAD and OM1.  CT demonstrated atherosclerotic disease in the descending thoracic aorta.  I saw him on March 04, 2019 at which time he denied any chest pain, palpitations or dyspnea.  He was experiencing bilateral leg swelling despite taking HCTZ.   During this COVID pandemic, he has gained approximately 15 pounds.  He has not been exercising due to hip discomfort.  Laboratory in March 2020 showed a total cholesterol 173, triglycerides 143 HDL 46 and LDL 103.  During that evaluation I recommended he discontinue hydrochlorothiazide and in its place start furosemide 40 mg for 2 days and then 20 mg daily.  I saw him in follow-up on September 03, 2019.  Prior to that evaluation he was  on amlodipine 10 mg, benazepril 40 mg, furosemide 20 mg daily.  At times he does experience shortness of breath and notes swelling right leg greater than left.  He has been able to take Zetia 10 mg in addition to very low-dose rosuvastatin 5 mg once a week and is tolerating this well.  Recently he has noticed slight increase in the shortness of breath.  He admits to further weight gain.  During that evaluation with his shortness of breath spironolactone 12.5 mg was added to his medical regimen both from previous improved blood pressure control as well as potential improvement in diastolic dysfunction contributing to his exertional dyspnea.  Laboratory 1 month ago showed total cholesterol 177, HDL 40 triglycerides 182 LDL 107.  BUN 13 creatinine 0.96.  I saw him in follow-up on October 07, 2019.  Repeat chemistry profile showed a potassium of 4.5.  BUN 13 and creatinine 1.06.  Over the prior month, he has had purposeful weight loss contributed by reduction in his meal servings.   He did receive his Covid vaccination.  He did experience some nonexertional shoulder chest soreness following the vaccination.  He denies any exertional chest pain symptomatology.  During his most recent evaluation blood pressure was improved but was still 138/78 and I recommended slight additional titration of spironolactone to 25 mg daily.  I saw him in June 2021 at that time he was experiencing mild chest tightness for 3 to 4 weeks.   The symptoms are not always exertional.  He admits to decreased energy.  He has been having difficulty with reflux.  He also experiences some sharp pain discomfort going to his back which is nonexertional.  He has been on amlodipine 10 mg, benazepril 40 mg, furosemide 20 mg daily and spironolactone 25 mg daily for his blood pressure and shortness of breath.  He was no longer taking low-dose Crestor and has continued to be on Zetia.  Lipid studies on December 02, 2019 showed an LDL cholesterol that had risen to 107.  Total  cholesterol was 173 triglycerides 139.  He sees Anthony [name] who may want to do esophageal dilatation in the future.  He admitted to fatigability, no energy, nocturia at least 3-4 times per night as well as the need to take a nap every day.  During that evaluation, since he had stopped statin therapy I recommended neclizet at 180/10 which is combination bempedoic acid plus Zetia in 1 pill.  We also discussed possible sleep apnea and the importance of weight loss.  When I saw him in September 2021 he told me he could not tolerate bempedoic acid due to development of abdominal cramps and for this reason has only been on Zetia.  He continues to have GERD but is not having any anginal symptoms.  He has difficulty walking secondary to his bilateral knee replacements and hip surgery.  He continues to experience daytime sleepiness, nocturia, and admits to snoring.   I last evaluated him on September 28, 2020.  He never pursued a sleep evaluation.  At that time he felt he  was sleeping well sleeping well and typically sleeps between 8 and 9 hours per night.  He denied any exertional anginal symptomatology but at times notes a very sharp twinge anteriorly.  He continues to have issues with his neck and will be undergoing an ablation procedure later this week.  He has not been exercising much due to arthritic issues and he is status post bilateral knee and hip replacement surgeries.  He continues to be on amlodipine 10 mg, benazepril 40 mg, furosemide 20 mg, and spironolactone 25 mg daily.  He continues to be on ezetimibe and is tolerating this well.  He had laboratory by his primary physician in on August 10, 2020 which showed total cholesterol 190, HDL 44, LDL 113, triglycerides 214.   I last saw him on August 02, 2021 and since my prior evaluation evaluations with Anthony Memos, Anthony [name] and Anthony Lofts, Anthony [name].  Presently, he denies any anginal type chest pain or shortness of breath.  He has a remote history of bilateral knee and hip replacements and as result does not walk much.  He continues to be on amlodipine 10 mg, benazepril 40 mg, carvedilol 6.25 mg twice a day, furosemide 40 mg and spironolactone 25 mg daily.  He continues to be on Zetia and over-the-counter fish oil for hyperlipidemia.  At times he has taken extra Lasix if there was increasing edema.  He has not been successful with weight loss.  He was last seen by Anthony Memos, Anthony [name] on February 08, 2022.  He had experienced shortness of breath with walking distances over 100 feet.  He had undergone lipid studies on May 31, 2022 which showed total cholesterol 167, HDL 46, triglycerides 147 and LDL 97.  Presently, he denies any chest pain.  He still notes occasional shortness of breath which has not progressed.  He is in need to undergo orthopedic surgery with right index knuckle replacement by Anthony [name] at [place].  He presents today for preoperative cardiology clearance and is scheduled to see Anthony [name]  later for pulmonary preoperative assessment.  He continues to be on amlodipine 10 mg, benazepril 40 mg, carvedilol 6.25 mg twice daily and spironolactone 25 mg daily.  He is on Zetia for hyperlipidemia 10 mg he takes furosemide 40 mg daily and occasionally takes an extra 20 mg depending upon fluid or weight gain.  He is on Nexium for GERD.  He presents for evaluation.  Past Medical History:  Diagnosis  Date   Benign neoplasm of colon    Benign neoplasm of skin of upper limb, including shoulder    Cancer of skin of external cheek    Carpal tunnel syndrome    Chest pain    a. normal cors by cath in 2015   Chronic diastolic HF (heart failure) (HCC)    Diaphragmatic hernia without mention of obstruction or gangrene    Dyslipidemia    Elevated prostate specific antigen (PSA)    GERD (gastroesophageal reflux disease)    Herpes zoster without mention of complication    History of cancer of external ear    History of hiatal hernia    Hypertension    Hypertrophy of prostate with urinary obstruction and other lower urinary tract symptoms (LUTS)    Impotence of organic origin    Lumbago    Obesity, unspecified    Osteoarthrosis, unspecified whether generalized or localized, lower leg    Other and unspecified hyperlipidemia    Other seborrheic keratosis    Stenosis, cervical spine    Unspecified tinnitus     Past Surgical History:  Procedure Laterality Date   ANTERIOR CERVICAL DECOMP/DISCECTOMY FUSION N/A 02/19/2018   Procedure: ANTERIOR CERVICAL DECOMPRESSION/DISCECTOMY FUSION - CERVICAL FOUR-CERVICAL FIVE - CERVICAL FIVE-CERVICAL SIX;  Surgeon: Earnie Larsson, MD;  Location: [facility];  Service: Neurosurgery;  Laterality: N/A;   CARDIOVASCULAR STRESS TEST  06/15/2012   Non-diagnostic for ischemia. No lexiscan EKG changes.   CHOLECYSTECTOMY  1992   EYE SURGERY     eyeband  2006   removal of right eyeband   INGUINAL HERNIA REPAIR  1984   left   INTRAOCULAR LENS REMOVAL  2008   right   LEFT  HEART CATHETERIZATION WITH CORONARY ANGIOGRAM N/A 09/05/2013   Procedure: LEFT HEART CATHETERIZATION WITH CORONARY ANGIOGRAM;  Surgeon: Troy Sine, MD;  Location: [facility] CATH LAB;  Service: Cardiovascular;  Laterality: N/A;   LOWER VENOUS EXTREMITY DOPPLER  01/26/2011   No evidence of thrombus or thrombophlebitis.   RETINAL DETACHMENT SURGERY  2001   right   TOTAL HIP ARTHROPLASTY  2012   bilateral   TOTAL KNEE ARTHROPLASTY  2011, 2012   bilateral   TRANSTHORACIC ECHOCARDIOGRAM  10/05/2009   EF >55%, normal LV size, systolic, and diastolic function.    Allergies  Allergen Reactions   Lipitor [Atorvastatin] Other (See Comments)    Leg pain/cramps; statin myopathy   Red Yeast Rice [Cholestin] Other (See Comments)    Makes legs hurt, myopathy   Statins Other (See Comments)    "muscle break down", statin myopathy   Statins Support [Acid Blockers Support] Other (See Comments)   Codeine Other (See Comments)    Headache   Latex Rash    Current Outpatient Medications  Medication Sig Dispense Refill   amLODipine (NORVASC) 10 MG tablet TAKE 1 TABLET BY MOUTH EVERY DAY IN THE MORNING 90 tablet 3   aspirin EC 81 MG tablet Take 81 mg by mouth daily. Swallow whole.     Bempedoic Acid-Ezetimibe (NEXLIZET) 180-10 MG TABS Take 1 tablet by mouth daily. 30 tablet 11   brimonidine (ALPHAGAN P) 0.1 % SOLN      carvedilol (COREG) 6.25 MG tablet TAKE 1 TABLET BY MOUTH TWICE A DAY 180 tablet 3   dorzolamide-timolol (COSOPT) 22.3-6.8 MG/ML ophthalmic solution Place 2 drops into the right eye 2 (two) times daily.      esomeprazole (NEXIUM) 40 MG capsule Take 1 capsule (40 mg total) by mouth 2 (two) times  daily before a meal. 180 capsule 3   famotidine (PEPCID) 40 MG tablet Take 1 tablet by mouth at bedtime.     finasteride (PROSCAR) 5 MG tablet TAKE 1 TABLET BY MOUTH EVERY DAY IN THE MORNING 90 tablet 1   furosemide (LASIX) 40 MG tablet TAKE 1 DAILY. TAKE AN ADDITIONAL 20 MG FOR WEIGHT AGAIN. (3 POUNDS  OVERNIGHT OR 5 POUNDS IN 1 WEEK.) 105 tablet 3   Lactobacillus-Inulin (CULTURELLE ADULT ULT BALANCE PO) Take by mouth.     olmesartan (BENICAR) 40 MG tablet Take 1 tablet (40 mg total) by mouth daily. 90 tablet 3   Polyvinyl Alcohol-Povidone (REFRESH OP) Place 1 drop into the right eye daily as needed (dry eyes/irritation).     spironolactone (ALDACTONE) 25 MG tablet TAKE 1 TABLET (25 MG TOTAL) BY MOUTH DAILY. 90 tablet 1   Omega-3 Fatty Acids (FISH OIL PO) Take 1,400 mg by mouth.     No current facility-administered medications for this visit.    Social History   Socioeconomic History   Marital status: Married    Spouse name: Not on file   Number of children: 2   Years of education: Not on file   Highest education level: Not on file  Occupational History   Occupation: Medical sales representative --retired    Fish farm manager: [employer]    Comment: And Personal assistant  Tobacco Use   Smoking status: Never    Passive exposure: Past   Smokeless tobacco: Never  Vaping Use   Vaping Use: Never used  Substance and Sexual Activity   Alcohol use: No   Drug use: No   Sexual activity: Yes    Partners: Female    Comment: wife  Other Topics Concern   Not on file  Social History Narrative   Daughter and son      Living Will   Wife is health care POA--daughter is alternate   Would accept resuscitation attempts--but no prolonged ventilation or tube feeds   Social Determinants of Health   Financial Resource Strain: Low Risk  (10/31/2017)   Overall Financial Resource Strain (CARDIA)    Difficulty of Paying Living Expenses: Not hard at all  Food Insecurity: No Food Insecurity (10/31/2017)   Hunger Vital Sign    Worried About Running Out of Food in the Last Year: Never true    Marrowbone in the Last Year: Never true  Transportation Needs: No Transportation Needs (10/31/2017)   PRAPARE - Hydrologist (Medical): No    Lack of Transportation (Non-Medical): No  Physical Activity:  Insufficiently Active (10/31/2017)   Exercise Vital Sign    Days of Exercise per Week: 2 days    Minutes of Exercise per Session: 60 min  Stress: No Stress Concern Present (10/31/2017)   [place]    Feeling of Stress : Only a little  Social Connections: Moderately Integrated (10/31/2017)   Social Connection and Isolation Panel [NHANES]    Frequency of Communication with Friends and Family: Twice a week    Frequency of Social Gatherings with Friends and Family: Twice a week    Attends Religious Services: More than 4 times per year    Active Member of [employer] or Organizations: No    Attends Archivist Meetings: Never    Marital Status: Married  Human resources officer Violence: Not At Risk (10/31/2017)   Humiliation, Afraid, Rape, and Kick questionnaire    Fear of Current  or Ex-Partner: No    Emotionally Abused: No    Physically Abused: No    Sexually Abused: No   Social is notable that he is married has 2 children and 2 grandchildren. He is not routinely exercise. There is no tobacco use. He does drink occasional alcohol.  Family History  Problem Relation Age of Onset   Diabetes Mother    Arrhythmia Mother    Hypertension Mother    Dementia Father    Pulmonary embolism Father    Hypertension Father    Cancer Sister        Liver cancer - Histocytic lymphoma   Cancer Sister 42       Skin cancer   Hypertension Sister 20   Diabetes Sister    Stroke Brother    Diabetes Brother    Hypertension Brother    Cancer Daughter    Cancer Maternal Grandmother        Skin cancer   Heart attack Maternal Grandfather    Cancer Paternal Grandfather    Hypertension Paternal Grandfather     ROS General: Negative; No fevers, chills, or night sweats;  positive for a 16 pound weight loss in September 2018.  Positive for fatigue HEENT: Negative; No changes in vision or hearing, sinus congestion, difficulty swallowing Pulmonary:  Negative; No cough, wheezing, shortness of breath, hemoptysis Cardiovascular: Positive for occasional exertional shortness of breath.  No exertional chest pain.  Positive for right leg greater than left swelling GI: Positive for GERD  GU: Positive for nocturia; BPH Musculoskeletal: Lateral hip and knee discomfort; status post recent surgery C4-5/C5-6 Hematologic/Oncology: Negative; no easy bruising, bleeding Endocrine: Negative; no heat/cold intolerance; no diabetes Neuro: Mild left arm paresthesias from his cervical degenerative disease Skin: Negative; No rashes or skin lesions Psychiatric: Negative; No behavioral problems, depression Sleep: Negative; No snoring, daytime sleepiness, hypersomnolence, bruxism, restless legs, hypnogognic hallucinations, no cataplexy Other comprehensive 14 point system review is negative.  PE BP 122/62 (BP Location: Left Arm, Patient Position: Sitting, Cuff Size: Large)   Pulse 75   Ht 5\' 8"  (1.727 m)   Wt 289 lb 6.4 oz (131.3 kg)   SpO2 97%   BMI 44.00 kg/m    Repeat blood pressure by me was 120/68  Wt Readings from Last 3 Encounters:  11/11/22 289 lb 6.4 oz (131.3 kg)  11/03/22 289 lb 3.2 oz (131.2 kg)  09/12/22 286 lb (129.7 kg)     Physical Exam BP 122/62 (BP Location: Left Arm, Patient Position: Sitting, Cuff Size: Large)   Pulse 75   Ht 5\' 8"  (1.727 m)   Wt 289 lb 6.4 oz (131.3 kg)   SpO2 97%   BMI 44.00 kg/m  General: Alert, oriented, no distress.  Skin: normal turgor, no rashes, warm and dry HEENT: Normocephalic, atraumatic. Pupils equal round and reactive to light; sclera anicteric; extraocular muscles intact; Fundi ** Nose without nasal septal hypertrophy Mouth/Parynx benign; Mallinpatti scale Neck: No JVD, no carotid bruits; normal carotid upstroke Lungs: clear to ausculatation and percussion; no wheezing or rales Chest wall: without tenderness to palpitation Heart: PMI not displaced, RRR, s1 s2 normal, 1/6 systolic murmur, no  diastolic murmur, no rubs, gallops, thrills, or heaves Abdomen: soft, nontender; no hepatosplenomehaly, BS+; abdominal aorta nontender and not dilated by palpation. Back: no CVA tenderness Pulses 2+ Musculoskeletal: full range of motion, normal strength, no joint deformities Extremities: no clubbing cyanosis or edema, Homan's sign negative  Neurologic: grossly nonfocal; Cranial nerves grossly wnl Psychologic: Normal mood and  affect    General: Alert, oriented, no distress.  Morbid obesity with BMI 42.8 Skin: normal turgor, no rashes, warm and dry HEENT: Normocephalic, atraumatic. Pupils equal round and reactive to light; sclera anicteric; extraocular muscles intact;  Nose without nasal septal hypertrophy Mouth/Parynx benign; Mallinpatti scale 3 Neck: No JVD, no carotid bruits; normal carotid upstroke Lungs: clear to ausculatation and percussion; no wheezing or rales Chest wall: without tenderness to palpitation Heart: PMI not displaced, RRR, s1 s2 normal, 1/6 systolic murmur, no diastolic murmur, no rubs, gallops, thrills, or heaves Abdomen: soft, nontender; no hepatosplenomehaly, BS+; abdominal aorta nontender and not dilated by palpation. Back: no CVA tenderness Pulses 2+ Musculoskeletal: full range of motion, normal strength, no joint deformities Extremities: no clubbing cyanosis or edema, Homan's sign negative  Neurologic: grossly nonfocal; Cranial nerves grossly wnl Psychologic: Normal mood and affect    August 05, 2022 ECG (independently read by me): NSR at 71, no ecotpy, no ST changes  August 02, 2021 ECG (independently read by me): Normal sinus rhythm at 74 bpm.  No ectopy.  Normal intervals.  September 2021 ECG (independently read by me): Normal sinus rhythm at 82 bpm.  No significant ST-T change.  Normal intervals.  No ectopy  June 2021 ECG (independently read by me): NSR at 73;  No ectopy, no ST changes, normal intervals  January 12,2021 ECG (independently read by  me): Normal sinus rhythm at 81 bpm.  No ectopy.  Normal intervals.  July 2020 ECG (independently read by me): NSR at 70  January 2020 ECG (independently read by me): Normal sinus rhythm at 74 bpm.  No ectopy.  No ST segment changes.  April 2019 ECG (independently read by me): Normal sinus rhythm at 66 bpm.  Normal intervals.  No ectopy.  No ST segment changes.  August 28, 2017 ECG (independently read by me): Normal sinus rhythm at 63 bpm.  Normal intervals.  No ST segment changes.  February 2015 ECG (independently read by me): Normal sinus rhythm at 66 beats per minute. PR interval 160 ms. QTC intervals 34 ms. No significant ST-T changes.  November 2014 ECG (independently read by me)::Normal sinus rhythm at 69 beats per minute. No ectopy. Normal intervals.  LABS:     Latest Ref Rng & Units 11/08/2022   12:14 PM 06/02/2022    2:09 PM 09/01/2021    9:49 AM  BMP  Glucose 70 - 99 mg/dL 94  86  96   BUN 8 - 27 mg/dL 16  14  18    Creatinine 0.76 - 1.27 mg/dL 0.98  1.03  0.94   BUN/Creat Ratio 10 - 24 16  SEE NOTE:  NOT APPLICABLE   Sodium Q000111Q - 144 mmol/L 146  140  141   Potassium 3.5 - 5.2 mmol/L 4.4  3.9  4.1   Chloride 96 - 106 mmol/L 108  106  106   CO2 20 - 29 mmol/L 23  27  29    Calcium 8.6 - 10.2 mg/dL 9.3  8.9  9.3       Latest Ref Rng & Units 06/02/2022    2:09 PM 03/01/2021    9:00 AM 11/25/2019    8:04 AM  CBC  WBC 3.8 - 10.8 Thousand/uL 9.7  7.9  7.8   Hemoglobin 13.2 - 17.1 g/dL 15.2  15.8  16.1   Hematocrit 38.5 - 50.0 % 44.0  47.7  47.5   Platelets 140 - 400 Thousand/uL 205  204  220    Lab  Results  Component Value Date   TSH 3.27 11/25/2019    Lab Results  Component Value Date   HGBA1C 5.4 03/01/2021      Latest Ref Rng & Units 11/08/2022   12:14 PM 09/01/2021    9:49 AM 12/02/2019    8:44 AM  Hepatic Function  Total Protein 6.0 - 8.5 g/dL 5.6  5.7  5.5   Albumin 3.8 - 4.8 g/dL 4.3   4.1   AST 0 - 40 IU/L 24  18  17    ALT 0 - 44 IU/L 25  21  19    Alk  Phosphatase 44 - 121 IU/L 82   98   Total Bilirubin 0.0 - 1.2 mg/dL 0.4  0.5  0.3    Lipid Panel     Component Value Date/Time   CHOL 127 11/08/2022 1214   CHOL 191 07/22/2013 0835   TRIG 171 (H) 11/08/2022 1214   TRIG 253 (H) 07/22/2013 0835   HDL 38 (L) 11/08/2022 1214   HDL 38 (L) 07/22/2013 0835   CHOLHDL 3.3 11/08/2022 1214   CHOLHDL 3.6 03/30/2022 0858   VLDL 35 (H) 10/27/2016 0825   LDLCALC 60 11/08/2022 1214   LDLCALC 97 03/30/2022 0858   LDLCALC 102 (H) 07/22/2013 0835    RADIOLOGY: No results found.  IMPRESSION:  No diagnosis found.    ASSESSMENT AND PLAN: Mr. Maron is a 72 year-old gentleman who has a history of morbid obesity, hyperlipidemia, hypertension, and GERD.  He developed chest discomfort in 2015 which ultimately led to definitive cardiac catheterization which revealed normal coronary arteries.  He  underwent successful surgery by Anthony [name] and tolerated this well from a cardiac standpoint.  He had developed some episodes of chest pain which most likely were of musculoskeletal etiology.  A CTA in 2020 showed minimal coronary plaque with a calcium score of 26.4  (20th percentile) involving his LAD ostium and OM1 vessel.  He also was noted to have atherosclerosis of his thoracic aorta.  Presently, he denies any chest pain.  He has had issues with shortness of breath.  His last echo Doppler study on June 11, 2021 showed normal LV function with EF 60 to 65% with normal wall motion and mild grade 1 diastolic dysfunction.  He had mildly elevated systolic pressure.  There was mild aortic valve sclerosis without stenosis.  There was mild aortic dilatation of the ascending aorta at 41 mm and aortic arch at 36 mm.  He is in need to undergo knuckle replacement surgery on his right index finger.  He will hold aspirin for the procedure.  With his mild coronary calcification and LDL cholesterol at 97, I have suggested the addition of bempedoic acid to his  Zetia which should help him achieve an LDL less than 70 and I provided him samples of Nexletol 180 mg.  If he tolerates this, he can then be switched to nexlizet which is the combination bempedoic acid/Zetia and 1 pill.  He was given preoperative cardiac clearance and will hold his aspirin for at least 5 days prior to the procedure. I am recommending he undergo a follow-up chemistry, fasting lipid studies and will check an LP(a) in 3 months and plan to see him in 6 months for follow-up evaluation.   Troy Sine, MD, [facility]  11/11/2022 9:18 AM

## 2022-11-11 NOTE — Patient Instructions (Signed)
Medication Instructions:  Your physician recommends that you continue on your current medications as directed. Please refer to the Current Medication list given to you today.  *If you need a refill on your cardiac medications before your next appointment, please call your pharmacy*  Testing/Procedures: Your physician has recommended that you have a sleep study. This test records several body functions during sleep, including: brain activity, eye movement, oxygen and carbon dioxide blood levels, heart rate and rhythm, breathing rate and rhythm, the flow of air through your mouth and nose, snoring, body muscle movements, and chest and belly movement.  Follow-Up: At [facility], you and your health needs are our priority.  As part of our continuing mission to provide you with exceptional heart care, we have created designated Provider Care Teams.  These Care Teams include your primary Cardiologist (physician) and Advanced Practice Providers (APPs -  Physician Assistants and Nurse Practitioners) who all work together to provide you with the care you need, when you need it.  We recommend signing up for the patient portal called "MyChart".  Sign up information is provided on this After Visit Summary.  MyChart is used to connect with patients for Virtual Visits (Telemedicine).  Patients are able to view lab/test results, encounter notes, upcoming appointments, etc.  Non-urgent messages can be sent to your provider as well.   To learn more about what you can do with MyChart, go to [email or web address].    Your next appointment:   4-6 month(s)  Provider:   Shelva Majestic, MD

## 2022-11-13 ENCOUNTER — Encounter: Payer: Self-pay | Admitting: Cardiovascular Disease

## 2022-12-08 ENCOUNTER — Other Ambulatory Visit: Payer: Self-pay | Admitting: Family Medicine

## 2023-01-25 ENCOUNTER — Other Ambulatory Visit: Payer: Self-pay | Admitting: Family Medicine

## 2023-01-25 DIAGNOSIS — E7849 Other hyperlipidemia: Secondary | ICD-10-CM

## 2023-02-28 ENCOUNTER — Other Ambulatory Visit: Payer: Self-pay | Admitting: Family Medicine

## 2023-03-01 ENCOUNTER — Ambulatory Visit (INDEPENDENT_AMBULATORY_CARE_PROVIDER_SITE_OTHER): Payer: PPO | Admitting: Internal Medicine

## 2023-03-01 ENCOUNTER — Encounter: Payer: Self-pay | Admitting: Internal Medicine

## 2023-03-01 VITALS — BP 120/72 | HR 67 | Temp 97.9°F | Ht 68.0 in | Wt 295.0 lb

## 2023-03-01 DIAGNOSIS — K420 Umbilical hernia with obstruction, without gangrene: Secondary | ICD-10-CM | POA: Insufficient documentation

## 2023-03-01 MED ORDER — FINASTERIDE 5 MG PO TABS: ORAL_TABLET | ORAL | 3 refills | Status: DC

## 2023-03-01 NOTE — Assessment & Plan Note (Signed)
Seems likely to be causing this ongoing but low grade pain and nausea Will check abd/pelvis CT to delineate what is going on (and to make sure pain is not from something else) Plan referral to [facility] if surgery needed

## 2023-03-01 NOTE — Progress Notes (Signed)
Subjective:    Patient ID: Anthony Canner., male    DOB: [birth year], 72 y.o.   MRN: [id number]  HPI Here due to abdominal pain  Having lower abdominal pain for the past month or so Notes it suprapubic bilaterally Some pain by umbilical hernia as well Fairly constant---may be worse with sitting on riding mower No alleviating factors--tries to change position, etc. May be worse with leaning forward  Current Outpatient Medications on File Prior to Visit  Medication Sig Dispense Refill   amLODipine (NORVASC) 10 MG tablet TAKE 1 TABLET BY MOUTH EVERY DAY IN THE MORNING 90 tablet 3   aspirin EC 81 MG tablet Take 81 mg by mouth daily. Swallow whole.     brimonidine (ALPHAGAN P) 0.1 % SOLN      carvedilol (COREG) 6.25 MG tablet TAKE 1 TABLET BY MOUTH TWICE A DAY 180 tablet 3   dorzolamide-timolol (COSOPT) 22.3-6.8 MG/ML ophthalmic solution Place 2 drops into the right eye 2 (two) times daily.      esomeprazole (NEXIUM) 40 MG capsule Take 1 capsule (40 mg total) by mouth 2 (two) times daily before a meal. 180 capsule 3   famotidine (PEPCID) 40 MG tablet Take 1 tablet by mouth at bedtime.     finasteride (PROSCAR) 5 MG tablet TAKE 1 TABLET BY MOUTH EVERY DAY IN THE MORNING 90 tablet 1   furosemide (LASIX) 40 MG tablet TAKE 1 DAILY. TAKE AN ADDITIONAL 20 MG FOR WEIGHT AGAIN. (3 POUNDS OVERNIGHT OR 5 POUNDS IN 1 WEEK.) 105 tablet 3   Lactobacillus-Inulin (CULTURELLE ADULT ULT BALANCE PO) Take by mouth.     olmesartan (BENICAR) 40 MG tablet Take 1 tablet (40 mg total) by mouth daily. 90 tablet 3   Omega-3 Fatty Acids (FISH OIL PO) Take 1,400 mg by mouth.     Polyvinyl Alcohol-Povidone (REFRESH OP) Place 1 drop into the right eye daily as needed (dry eyes/irritation).     spironolactone (ALDACTONE) 25 MG tablet TAKE 1 TABLET (25 MG TOTAL) BY MOUTH DAILY. 90 tablet 1   No current facility-administered medications on file prior to visit.    Allergies  Allergen Reactions   Lipitor  [Atorvastatin] Other (See Comments)    Leg pain/cramps; statin myopathy   Red Yeast Rice [Cholestin] Other (See Comments)    Makes legs hurt, myopathy   Statins Other (See Comments)    "muscle break down", statin myopathy   Statins Support [Acid Blockers Support] Other (See Comments)   Codeine Other (See Comments)    Headache   Latex Rash    Past Medical History:  Diagnosis Date   Benign neoplasm of colon    Benign neoplasm of skin of upper limb, including shoulder    Cancer of skin of external cheek    Carpal tunnel syndrome    Chest pain    a. normal cors by cath in 2015   Chronic diastolic HF (heart failure) (HCC)    Diaphragmatic hernia without mention of obstruction or gangrene    Dyslipidemia    Elevated prostate specific antigen (PSA)    GERD (gastroesophageal reflux disease)    Herpes zoster without mention of complication    History of cancer of external ear    History of hiatal hernia    Hypertension    Hypertrophy of prostate with urinary obstruction and other lower urinary tract symptoms (LUTS)    Impotence of organic origin    Lumbago    Obesity, unspecified    Osteoarthrosis,  unspecified whether generalized or localized, lower leg    Other and unspecified hyperlipidemia    Other seborrheic keratosis    Stenosis, cervical spine    Unspecified tinnitus     Past Surgical History:  Procedure Laterality Date   ANTERIOR CERVICAL DECOMP/DISCECTOMY FUSION N/A 02/19/2018   Procedure: ANTERIOR CERVICAL DECOMPRESSION/DISCECTOMY FUSION - CERVICAL FOUR-CERVICAL FIVE - CERVICAL FIVE-CERVICAL SIX;  Surgeon: Julio Sicks, MD;  Location: [facility] OR;  Service: Neurosurgery;  Laterality: N/A;   CARDIOVASCULAR STRESS TEST  06/15/2012   Non-diagnostic for ischemia. No lexiscan EKG changes.   CHOLECYSTECTOMY  1992   EYE SURGERY     eyeband  2006   removal of right eyeband   INGUINAL HERNIA REPAIR  1984   left   INTRAOCULAR LENS REMOVAL  2008   right   LEFT HEART CATHETERIZATION  WITH CORONARY ANGIOGRAM N/A 09/05/2013   Procedure: LEFT HEART CATHETERIZATION WITH CORONARY ANGIOGRAM;  Surgeon: Lennette Bihari, MD;  Location: [facility] CATH LAB;  Service: Cardiovascular;  Laterality: N/A;   LOWER VENOUS EXTREMITY DOPPLER  01/26/2011   No evidence of thrombus or thrombophlebitis.   RETINAL DETACHMENT SURGERY  2001   right   TOTAL HIP ARTHROPLASTY  2012   bilateral   TOTAL KNEE ARTHROPLASTY  2011, 2012   bilateral   TRANSTHORACIC ECHOCARDIOGRAM  10/05/2009   EF >55%, normal LV size, systolic, and diastolic function.    Family History  Problem Relation Age of Onset   Diabetes Mother    Arrhythmia Mother    Hypertension Mother    Dementia Father    Pulmonary embolism Father    Hypertension Father    Cancer Sister        Liver cancer - Histocytic lymphoma   Cancer Sister 53       Skin cancer   Hypertension Sister 42   Diabetes Sister    Stroke Brother    Diabetes Brother    Hypertension Brother    Cancer Daughter    Cancer Maternal Grandmother        Skin cancer   Heart attack Maternal Grandfather    Cancer Paternal Grandfather    Hypertension Paternal Grandfather     Social History   Socioeconomic History   Marital status: Married    Spouse name: Not on file   Number of children: 2   Years of education: Not on file   Highest education level: 12th grade  Occupational History   Occupation: Cabin crew --retired    Associate Professor: [employer]    Comment: And Dispensing optician  Tobacco Use   Smoking status: Never    Passive exposure: Past   Smokeless tobacco: Never  Vaping Use   Vaping Use: Never used  Substance and Sexual Activity   Alcohol use: No   Drug use: No   Sexual activity: Yes    Partners: Female    Comment: wife  Other Topics Concern   Not on file  Social History Narrative   Daughter and son      Living Will   Wife is health care POA--daughter is alternate   Would accept resuscitation attempts--but no prolonged ventilation or tube feeds   Social  Determinants of Health   Financial Resource Strain: Low Risk  (02/28/2023)   Overall Financial Resource Strain (CARDIA)    Difficulty of Paying Living Expenses: Not hard at all  Food Insecurity: No Food Insecurity (02/28/2023)   Hunger Vital Sign    Worried About Running Out of Food in the Last  Year: Never true    Ran Out of Food in the Last Year: Never true  Transportation Needs: No Transportation Needs (02/28/2023)   PRAPARE - Administrator, Civil Service (Medical): No    Lack of Transportation (Non-Medical): No  Physical Activity: Insufficiently Active (02/28/2023)   Exercise Vital Sign    Days of Exercise per Week: 1 day    Minutes of Exercise per Session: 20 min  Stress: No Stress Concern Present (02/28/2023)   [employer] of Occupational Health - Occupational Stress Questionnaire    Feeling of Stress : Only a little  Social Connections: Moderately Integrated (02/28/2023)   Social Connection and Isolation Panel [NHANES]    Frequency of Communication with Friends and Family: More than three times a week    Frequency of Social Gatherings with Friends and Family: Three times a week    Attends Religious Services: Never    Active Member of Clubs or Organizations: Yes    Attends Banker Meetings: More than 4 times per year    Marital Status: Married  Catering manager Violence: Not At Risk (10/31/2017)   Humiliation, Afraid, Rape, and Kick questionnaire    Fear of Current or Ex-Partner: No    Emotionally Abused: No    Physically Abused: No    Sexually Abused: No   Review of Systems Bowels regular--1-2 per day Some nausea but no vomiting---just this past month No dysuria or urinary symptoms No fever Weight is stable    Objective:   Physical Exam Constitutional:      Appearance: He is well-developed.  Cardiovascular:     Rate and Rhythm: Normal rate and regular rhythm.     Heart sounds: No murmur heard.    No gallop.  Pulmonary:     Effort: Pulmonary  effort is normal.     Breath sounds: Normal breath sounds. No wheezing or rales.  Abdominal:     Palpations: Abdomen is soft.     Tenderness: There is no guarding or rebound.     Comments: Small non reducible umbilical hernia with some tenderness Has mild diffuse tenderness both above and below this area   Musculoskeletal:     Cervical back: Neck supple.  Lymphadenopathy:     Cervical: No cervical adenopathy.  Neurological:     Mental Status: He is alert.  Psychiatric:        Mood and Affect: Mood normal.        Behavior: Behavior normal.            Assessment & Plan:

## 2023-03-13 ENCOUNTER — Telehealth: Payer: Self-pay

## 2023-03-13 ENCOUNTER — Other Ambulatory Visit (HOSPITAL_COMMUNITY): Payer: Self-pay

## 2023-03-13 NOTE — Telephone Encounter (Signed)
Pharmacy Patient Advocate Encounter   Received notification from CoverMyMeds that prior authorization for NEXLIZET is required/requested.   Insurance verification completed.   The patient is insured through [employer]/ [employer] .   Per test claim: PA submitted to [employer] via Fax Key/confirmation #[id number] NA Status is pending

## 2023-03-14 NOTE — Telephone Encounter (Signed)
Pharmacy Patient Advocate Encounter  Received notification from [employer] that Prior Authorization for NEXLIZET has been APPROVED from 03/13/23 to 03/12/24.Marland Kitchen

## 2023-03-15 ENCOUNTER — Encounter: Payer: Self-pay | Admitting: Cardiovascular Disease

## 2023-03-15 ENCOUNTER — Ambulatory Visit: Payer: PPO | Attending: Cardiovascular Disease | Admitting: Cardiovascular Disease

## 2023-03-15 VITALS — BP 151/81 | HR 67 | Ht 70.0 in | Wt 290.6 lb

## 2023-03-15 DIAGNOSIS — R011 Cardiac murmur, unspecified: Secondary | ICD-10-CM | POA: Diagnosis not present

## 2023-03-15 DIAGNOSIS — R0683 Snoring: Secondary | ICD-10-CM | POA: Diagnosis not present

## 2023-03-15 DIAGNOSIS — E785 Hyperlipidemia, unspecified: Secondary | ICD-10-CM | POA: Diagnosis not present

## 2023-03-15 DIAGNOSIS — Z789 Other specified health status: Secondary | ICD-10-CM

## 2023-03-15 DIAGNOSIS — I10 Essential (primary) hypertension: Secondary | ICD-10-CM | POA: Diagnosis not present

## 2023-03-15 DIAGNOSIS — G4719 Other hypersomnia: Secondary | ICD-10-CM

## 2023-03-15 MED ORDER — ESOMEPRAZOLE MAGNESIUM 40 MG PO CPDR: 40.0000 mg | DELAYED_RELEASE_CAPSULE | Freq: Every day | ORAL | 3 refills | Status: DC

## 2023-03-15 NOTE — Progress Notes (Signed)
Patient ID: Anthony [name]., male   DOB: 1951-04-29, 72 y.o.   MRN: [id number]       HPI: Anthony [name]. is a 72 y.o. male who presents to the office for a 4 month follow-up cardiology evaluation.   Mr. Anthony [name] has a history of hypertension, mild obesity, as well as hyperlipidemia. In the past, he developed CPK elevation secondary to statin therapy. He has been able to tolerate Zetia. A nuclear perfusion study done in October 2013 for chest pain showed normal perfusion without scar or ischemia.  Laboratory in October 2014 showed cholesterol 196, triglycerides significantly elevated at 291, HDL of 34, VLDL increased at 58, and  LDL 106. Renal function was normal. BUN 15 kerning 0.79.   He developed recurrent episodes of chest pain for which he saw Corine Shelter and because of exertionally precipitated episodes of discomfort definitive cardiac catheterization was recommended. This was done by me on 09/05/2013 and revealed normal coronary arteries with normal LV function without focal segmental wall motion abnormalities. Ejection fraction was 55%.  He underwent an echo Doppler study on 09/04/2013 which showed an ejection fraction of 55-60%. He did have grade 1 diastolic dysfunction and mild left ventricular hypertrophy. He had mild mitral annular calcification with mildly thickened leaflets and trivial mitral regurgitation. A moderate pressure estimate was 27 mm.  He was  seen by Randall An on 08/17/2016 with complaints of chest discomfort.  He's episodes would occur often when he was walking on his driveway her in the grocery store and had been ongoing for 1-2 months.  He had diffuse tenderness to palpation along his left pectoral region.  On exam it was felt that some of his chest pain was musculoskeletal in etiology.  He was hypertensive and she further titrated amlodipine.  Because of exertional dyspnea.  She referred him for an echo Doppler study which was done on 08/23/2016.  This showed mild  LVH with normal systolic function and grade 1 diastolic dysfunction.  There is very mild increased peak PA pressure 34 mm.  I saw him in January 2019 after not having seen him since February 2015.  At that time, his pressure was elevated despite taking amlodipine 7.5 mg.  I recommended further titration to 10 mg daily.  On this increased dose, he states his blood pressure at home typically has been running in the 130-135 range.  He denies chest pain PND orthopnea.  He has a history of GERD which is controlled with Nexium.  He has had some issues with left arm paresthesias and was diagnosed with significant cervical degenerative disc disease.  He is scheduled to undergo neck surgery on December 12, 2017 by Dr. Dutch Quint involving C4-5 and C5-6.  Preoperative surgical clearance was recommended.  He denies any chest pain or shortness of breath.  He has not been exercising as he had in the past in the winter months but hopes to do so following his surgery and with improved weather.    When I saw him in April 2019 he was given surgical clearance for his cervical surgery.  He underwent successful surgery by Dr. Dutch Quint in July 2019 involving C4-5 and C5-6 and tolerated this well.   I saw him in January 2020 at that time he had begun to notice some episodes of chest wall discomfort.  He denied any clear-cut exertional precipitation and noted the chest discomfort intermittently.  He was unable to walk well secondary to bilateral knee and hip replacement surgery.  He also had experience of mild exertional shortness of breath.  I felt most likely that his chest pain was of musculoskeletal etiology.  However with his exertional dyspnea I recommended reevaluation of his coronary anatomy and suggested CT coronary angiography and also a 2-year follow-up echo Doppler assessment.  His echo study was done on August 30, 2018 and showed an EF of 60 to 65% without wall motion abnormalities.  There was grade 2 diastolic dysfunction.  There  was mild increased PA pressure at 35 mm.  There was mild MR.  Coronary CTA yielded calcium score of 26.4 which was 20th percentile for age and sex matched control.  He had normal coronary origin with right dominance.  There was minimal plaque in the ostial LAD and OM1.  CT demonstrated atherosclerotic disease in the descending thoracic aorta.  I saw him on March 04, 2019 at which time he denied any chest pain, palpitations or dyspnea.  He was experiencing bilateral leg swelling despite taking HCTZ.   During this COVID pandemic, he has gained approximately 15 pounds.  He has not been exercising due to hip discomfort.  Laboratory in March 2020 showed a total cholesterol 173, triglycerides 143 HDL 46 and LDL 103.  During that evaluation I recommended he discontinue hydrochlorothiazide and in its place start furosemide 40 mg for 2 days and then 20 mg daily.  I saw him in follow-up on September 03, 2019.  Prior to that evaluation he was  on amlodipine 10 mg, benazepril 40 mg, furosemide 20 mg daily.  At times he does experience shortness of breath and notes swelling right leg greater than left.  He has been able to take Zetia 10 mg in addition to very low-dose rosuvastatin 5 mg once a week and is tolerating this well.  Recently he has noticed slight increase in the shortness of breath.  He admits to further weight gain.  During that evaluation with his shortness of breath spironolactone 12.5 mg was added to his medical regimen both from previous improved blood pressure control as well as potential improvement in diastolic dysfunction contributing to his exertional dyspnea.  Laboratory 1 month ago showed total cholesterol 177, HDL 40 triglycerides 182 LDL 107.  BUN 13 creatinine 0.96.  I saw him in follow-up on October 07, 2019.  Repeat chemistry profile showed a potassium of 4.5.  BUN 13 and creatinine 1.06.  Over the prior month, he has had purposeful weight loss contributed by reduction in his meal servings.  He  did receive his Covid vaccination.  He did experience some nonexertional shoulder chest soreness following the vaccination.  He denies any exertional chest pain symptomatology.  During his most recent evaluation blood pressure was improved but was still 138/78 and I recommended slight additional titration of spironolactone to 25 mg daily.  I saw him in June 2021 at that time he was experiencing mild chest tightness for 3 to 4 weeks.   The symptoms are not always exertional.  He admits to decreased energy.  He has been having difficulty with reflux.  He also experiences some sharp pain discomfort going to his back which is nonexertional.  He has been on amlodipine 10 mg, benazepril 40 mg, furosemide 20 mg daily and spironolactone 25 mg daily for his blood pressure and shortness of breath.  He was no longer taking low-dose Crestor and has continued to be on Zetia.  Lipid studies on December 02, 2019 showed an LDL cholesterol that had risen to 107.  Total  cholesterol was 173 triglycerides 139.  He sees Dr. Matthias Hughs who may want to do esophageal dilatation in the future.  He admitted to fatigability, no energy, nocturia at least 3-4 times per night as well as the need to take a nap every day.  During that evaluation, since he had stopped statin therapy I recommended neclizet at 180/10 which is combination bempedoic acid plus Zetia in 1 pill.  We also discussed possible sleep apnea and the importance of weight loss.  When I saw him in September 2021 he told me he could not tolerate bempedoic acid due to development of abdominal cramps and for this reason has only been on Zetia.  He continues to have GERD but is not having any anginal symptoms.  He has difficulty walking secondary to his bilateral knee replacements and hip surgery.  He continues to experience daytime sleepiness, nocturia, and admits to snoring.   I evaluated him on September 28, 2020.  He never pursued a sleep evaluation.  At that time he felt he was  sleeping well sleeping well and typically sleeps between 8 and 9 hours per night.  He denied any exertional anginal symptomatology but at times notes a very sharp twinge anteriorly.  He continues to have issues with his neck and will be undergoing an ablation procedure later this week.  He has not been exercising much due to arthritic issues and he is status post bilateral knee and hip replacement surgeries.  He continues to be on amlodipine 10 mg, benazepril 40 mg, furosemide 20 mg, and spironolactone 25 mg daily.  He continues to be on ezetimibe and is tolerating this well.  He had laboratory by his primary physician in on August 10, 2020 which showed total cholesterol 190, HDL 44, LDL 113, triglycerides 214.   I saw him on August 02, 2021 and since my prior evaluation evaluations with Edd Fabian, NP and Judy Pimple, PA.  Presently, he denies any anginal type chest pain or shortness of breath.  He has a remote history of bilateral knee and hip replacements and as result does not walk much.  He continues to be on amlodipine 10 mg, benazepril 40 mg, carvedilol 6.25 mg twice a day, furosemide 40 mg and spironolactone 25 mg daily.  He continues to be on Zetia and over-the-counter fish oil for hyperlipidemia.  At times he has taken extra Lasix if there was increasing edema.  He has not been successful with weight loss.  He was seen by Edd Fabian, NP on February 08, 2022.  He had experienced shortness of breath with walking distances over 100 feet.  He had undergone lipid studies on May 31, 2022 which showed total cholesterol 167, HDL 46, triglycerides 147 and LDL 97.  I saw him on August 05, 2022.  Presently, he denies any chest pain.  He still notes occasional shortness of breath which has not progressed.  He is in need to undergo orthopedic surgery with right index knuckle replacement by Dr. Janee Morn at [facility] orthopedics.  He presents today for preoperative cardiology clearance and is  scheduled to see Dr. Sherene Sires later for pulmonary preoperative assessment.  He continues to be on amlodipine 10 mg, benazepril 40 mg, carvedilol 6.25 mg twice daily and spironolactone 25 mg daily.  He is on Zetia for hyperlipidemia 10 mg he takes furosemide 40 mg daily and occasionally takes an extra 20 mg depending upon fluid or weight gain.  He is on Nexium for GERD.  He was given clearance to  undergo his surgery and was advised to hold aspirin for the procedure.  With his mild coronary calcification and LDL at 97 I suggested the addition of bempedoic acid to his Zetia.  I also recommended follow-up laboratory.  I last saw him on November 11, 2022.  He denied any chest pain but admitted to fatigue and no energy.   He does experience some shortness of breath with activity.  He has had prior bilateral knee and hip surgeries.  Prior to this office visit, he had undergone laboratory on November 08, 2022.  LP(a) was normal at 29.4, total cholesterol 127, triglycerides 171, HDL 38, and LDL 60.  Chemistries 3 revealed slight increase in sodium at 146, total protein was 5.6.  Creatinine 0.98.  Glucose 94.  Previously he had never pursued a sleep evaluation.  He typically goes to bed at 10 PM and wakes up at 7.  He is aware that he snores.  He also experiences nocturia at least 3 days/week.  He often is ready to take a nap at lunchtime due to daytime sleepiness.  He admits to some trace edema to his legs.  During that evaluation, ECG showed sinus rhythm at 71.  With his continued fatigability, morbid obesity, and snoring history I recommended he undergo a split-night sleep study for evaluation of obstructive sleep apnea.  Since I last saw him, Mr. Degollado denies any chest pain.  He admits to mild shortness of breath with activity.  He denies any chest tightness.  He never underwent evaluation for sleep apnea.  He has an umbilical hernia.  He continues to be on amlodipine 10 mg, carvedilol 6.25 mg twice a day, furosemide 40 mg  daily, olmesartan 40 mg for hypertension.  He is no longer on spironolactone.  He takes Nexium 40 mg daily for GERD.  An Epworth Sleepiness Scale score was calculated in the office today and this endorsed that 11 suggestive of excessive daytime sleepiness.  He presents for reevaluation.  Past Medical History:  Diagnosis Date   Benign neoplasm of colon    Benign neoplasm of skin of upper limb, including shoulder    Cancer of skin of external cheek    Carpal tunnel syndrome    Chest pain    a. normal cors by cath in 2015   Chronic diastolic HF (heart failure) (HCC)    Diaphragmatic hernia without mention of obstruction or gangrene    Dyslipidemia    Elevated prostate specific antigen (PSA)    GERD (gastroesophageal reflux disease)    Herpes zoster without mention of complication    History of cancer of external ear    History of hiatal hernia    Hypertension    Hypertrophy of prostate with urinary obstruction and other lower urinary tract symptoms (LUTS)    Impotence of organic origin    Lumbago    Obesity, unspecified    Osteoarthrosis, unspecified whether generalized or localized, lower leg    Other and unspecified hyperlipidemia    Other seborrheic keratosis    Stenosis, cervical spine    Unspecified tinnitus     Past Surgical History:  Procedure Laterality Date   ANTERIOR CERVICAL DECOMP/DISCECTOMY FUSION N/A 02/19/2018   Procedure: ANTERIOR CERVICAL DECOMPRESSION/DISCECTOMY FUSION - CERVICAL FOUR-CERVICAL FIVE - CERVICAL FIVE-CERVICAL SIX;  Surgeon: Julio Sicks, MD;  Location: [facility] OR;  Service: Neurosurgery;  Laterality: N/A;   CARDIOVASCULAR STRESS TEST  06/15/2012   Non-diagnostic for ischemia. No lexiscan EKG changes.   CHOLECYSTECTOMY  1992  EYE SURGERY     eyeband  2006   removal of right eyeband   INGUINAL HERNIA REPAIR  1984   left   INTRAOCULAR LENS REMOVAL  2008   right   LEFT HEART CATHETERIZATION WITH CORONARY ANGIOGRAM N/A 09/05/2013   Procedure: LEFT HEART  CATHETERIZATION WITH CORONARY ANGIOGRAM;  Surgeon: Lennette Bihari, MD;  Location: [facility] CATH LAB;  Service: Cardiovascular;  Laterality: N/A;   LOWER VENOUS EXTREMITY DOPPLER  01/26/2011   No evidence of thrombus or thrombophlebitis.   RETINAL DETACHMENT SURGERY  2001   right   TOTAL HIP ARTHROPLASTY  2012   bilateral   TOTAL KNEE ARTHROPLASTY  2011, 2012   bilateral   TRANSTHORACIC ECHOCARDIOGRAM  10/05/2009   EF >55%, normal LV size, systolic, and diastolic function.    Allergies  Allergen Reactions   Lipitor [Atorvastatin] Other (See Comments)    Leg pain/cramps; statin myopathy   Red Yeast Rice [Cholestin] Other (See Comments)    Makes legs hurt, myopathy   Statins Other (See Comments)    "muscle break down", statin myopathy   Statins Support [Acid Blockers Support] Other (See Comments)   Codeine Other (See Comments)    Headache   Latex Rash    Current Outpatient Medications  Medication Sig Dispense Refill   amLODipine (NORVASC) 10 MG tablet TAKE 1 TABLET BY MOUTH EVERY DAY IN THE MORNING 90 tablet 3   aspirin EC 81 MG tablet Take 81 mg by mouth daily. Swallow whole.     brimonidine (ALPHAGAN P) 0.1 % SOLN      carvedilol (COREG) 6.25 MG tablet TAKE 1 TABLET BY MOUTH TWICE A DAY 180 tablet 3   dorzolamide-timolol (COSOPT) 22.3-6.8 MG/ML ophthalmic solution Place 2 drops into the right eye 2 (two) times daily.      famotidine (PEPCID) 40 MG tablet Take 1 tablet by mouth at bedtime.     finasteride (PROSCAR) 5 MG tablet TAKE 1 TABLET BY MOUTH EVERY DAY IN THE MORNING 90 tablet 3   furosemide (LASIX) 40 MG tablet TAKE 1 DAILY. TAKE AN ADDITIONAL 20 MG FOR WEIGHT AGAIN. (3 POUNDS OVERNIGHT OR 5 POUNDS IN 1 WEEK.) 105 tablet 3   Lactobacillus-Inulin (CULTURELLE ADULT ULT BALANCE PO) Take by mouth.     meloxicam (MOBIC) 7.5 MG tablet Take by mouth daily.     olmesartan (BENICAR) 40 MG tablet Take 1 tablet (40 mg total) by mouth daily. 90 tablet 3   Omega-3 Fatty Acids (FISH OIL PO)  Take 1,400 mg by mouth.     pantoprazole (PROTONIX) 40 MG tablet Take 40 mg by mouth 2 (two) times daily.     Polyvinyl Alcohol-Povidone (REFRESH OP) Place 1 drop into the right eye daily as needed (dry eyes/irritation).     esomeprazole (NEXIUM) 40 MG capsule Take 1 capsule (40 mg total) by mouth daily at 12 noon. 180 capsule 3   No current facility-administered medications for this visit.    Social History   Socioeconomic History   Marital status: Married    Spouse name: Not on file   Number of children: 2   Years of education: Not on file   Highest education level: 12th grade  Occupational History   Occupation: Cabin crew --retired    Associate Professor: [employer]    Comment: And Dispensing optician  Tobacco Use   Smoking status: Never    Passive exposure: Past   Smokeless tobacco: Never  Vaping Use   Vaping status: Never Used  Substance  and Sexual Activity   Alcohol use: No   Drug use: No   Sexual activity: Yes    Partners: Female    Comment: wife  Other Topics Concern   Not on file  Social History Narrative   Daughter and son      Living Will   Wife is health care POA--daughter is alternate   Would accept resuscitation attempts--but no prolonged ventilation or tube feeds   Social Determinants of Health   Financial Resource Strain: Low Risk  (02/28/2023)   Overall Financial Resource Strain (CARDIA)    Difficulty of Paying Living Expenses: Not hard at all  Food Insecurity: No Food Insecurity (02/28/2023)   Hunger Vital Sign    Worried About Running Out of Food in the Last Year: Never true    Ran Out of Food in the Last Year: Never true  Transportation Needs: No Transportation Needs (02/28/2023)   PRAPARE - Administrator, Civil Service (Medical): No    Lack of Transportation (Non-Medical): No  Physical Activity: Insufficiently Active (02/28/2023)   Exercise Vital Sign    Days of Exercise per Week: 1 day    Minutes of Exercise per Session: 20 min  Stress: No Stress Concern  Present (02/28/2023)   [employer] of Occupational Health - Occupational Stress Questionnaire    Feeling of Stress : Only a little  Social Connections: Moderately Integrated (02/28/2023)   Social Connection and Isolation Panel [NHANES]    Frequency of Communication with Friends and Family: More than three times a week    Frequency of Social Gatherings with Friends and Family: Three times a week    Attends Religious Services: Never    Active Member of Clubs or Organizations: Yes    Attends Banker Meetings: More than 4 times per year    Marital Status: Married  Catering manager Violence: Not At Risk (10/31/2017)   Humiliation, Afraid, Rape, and Kick questionnaire    Fear of Current or Ex-Partner: No    Emotionally Abused: No    Physically Abused: No    Sexually Abused: No   Social is notable that he is married has 2 children and 2 grandchildren. He is not routinely exercise. There is no tobacco use. He does drink occasional alcohol.  Family History  Problem Relation Age of Onset   Diabetes Mother    Arrhythmia Mother    Hypertension Mother    Dementia Father    Pulmonary embolism Father    Hypertension Father    Cancer Sister        Liver cancer - Histocytic lymphoma   Cancer Sister 33       Skin cancer   Hypertension Sister 18   Diabetes Sister    Stroke Brother    Diabetes Brother    Hypertension Brother    Cancer Daughter    Cancer Maternal Grandmother        Skin cancer   Heart attack Maternal Grandfather    Cancer Paternal Grandfather    Hypertension Paternal Grandfather     ROS General: Negative; No fevers, chills, or night sweats;  positive for a 16 pound weight loss in September 2018.  Positive for fatigue HEENT: Negative; No changes in vision or hearing, sinus congestion, difficulty swallowing Pulmonary: Negative; No cough, wheezing, shortness of breath, hemoptysis Cardiovascular: Positive for occasional exertional shortness of breath.  No  exertional chest pain.  Positive for right leg greater than left swelling GI: Positive for GERD  GU:  Positive for nocturia; BPH Musculoskeletal: Lateral hip and knee discomfort; status post recent surgery C4-5/C5-6 Hematologic/Oncology: Negative; no easy bruising, bleeding Endocrine: Negative; no heat/cold intolerance; no diabetes Neuro: Mild left arm paresthesias from his cervical degenerative disease Skin: Negative; No rashes or skin lesions Psychiatric: Negative; No behavioral problems, depression Sleep: Positive for snoring, daytime fatigue, snoring, daytime sleepiness; no bruxism, restless legs, hypnogognic hallucinations, no cataplexy  An Epworth Sleepiness Scale was calculated in the office today and this endorsed at 11 as shown below consistent with excessive daytime sleepiness.  Epworth Sleepiness Scale: Situation   Chance of Dozing/Sleeping (0 = never , 1 = slight chance , 2 = moderate chance , 3 = high chance )   sitting and reading 2   watching TV 2   sitting inactive in a public place 1   being a passenger in a motor vehicle for an hour or more 1   lying down in the afternoon 2   sitting and talking to someone 1   sitting quietly after lunch (no alcohol) 2   while stopped for a few minutes in traffic as the driver 0   Total Score  11     Other comprehensive 14 point system review is negative.  PE BP (!) 151/81 (BP Location: Right Arm, Patient Position: Sitting, Cuff Size: Large)   Pulse 67   Ht 5\' 10"  (1.778 m)   Wt 290 lb 9.6 oz (131.8 kg)   SpO2 93%   BMI 41.70 kg/m    Repeat blood pressure by me was 126/80  Wt Readings from Last 3 Encounters:  03/15/23 290 lb 9.6 oz (131.8 kg)  03/01/23 295 lb (133.8 kg)  11/11/22 289 lb 6.4 oz (131.3 kg)   General: Alert, oriented, no distress.  Skin: normal turgor, no rashes, warm and dry HEENT: Normocephalic, atraumatic. Pupils equal round and reactive to light; sclera anicteric; extraocular muscles intact;  Nose  without nasal septal hypertrophy Mouth/Parynx benign; Mallinpatti scale 4 Neck: No JVD, no carotid bruits; normal carotid upstroke Lungs: clear to ausculatation and percussion; no wheezing or rales Chest wall: without tenderness to palpitation Heart: PMI not displaced, RRR, s1 s2 normal, 1/6 systolic murmur, no diastolic murmur, no rubs, gallops, thrills, or heaves Abdomen: soft, nontender; no hepatosplenomehaly, BS+; abdominal aorta nontender and not dilated by palpation. Back: no CVA tenderness Pulses 2+ Musculoskeletal: full range of motion, normal strength, no joint deformities Extremities: no clubbing cyanosis or edema, Homan's sign negative  Neurologic: grossly nonfocal; Cranial nerves grossly wnl Psychologic: Normal mood and affect     EKG Interpretation Date/Time:  Wednesday March 15 2023 08:33:03 EDT Ventricular Rate:  67 PR Interval:  180 QRS Duration:  86 QT Interval:  408 QTC Calculation: 431 R Axis:   26  Text Interpretation: Normal sinus rhythm Normal ECG When compared with ECG of 01-Jun-2006 07:48, No significant change was found Confirmed by Nicki Guadalajara ([id number]) on 03/15/2023 8:58:09 AM     August 05, 2022 ECG (independently read by me): NSR at 71, no ecotpy, no ST changes  August 02, 2021 ECG (independently read by me): Normal sinus rhythm at 74 bpm.  No ectopy.  Normal intervals.  September 2021 ECG (independently read by me): Normal sinus rhythm at 82 bpm.  No significant ST-T change.  Normal intervals.  No ectopy  June 2021 ECG (independently read by me): NSR at 73;  No ectopy, no ST changes, normal intervals  January 12,2021 ECG (independently read by me): Normal sinus rhythm at  81 bpm.  No ectopy.  Normal intervals.  July 2020 ECG (independently read by me): NSR at 70  January 2020 ECG (independently read by me): Normal sinus rhythm at 74 bpm.  No ectopy.  No ST segment changes.  April 2019 ECG (independently read by me): Normal sinus rhythm at 66  bpm.  Normal intervals.  No ectopy.  No ST segment changes.  August 28, 2017 ECG (independently read by me): Normal sinus rhythm at 63 bpm.  Normal intervals.  No ST segment changes.  February 2015 ECG (independently read by me): Normal sinus rhythm at 66 beats per minute. PR interval 160 ms. QTC intervals 34 ms. No significant ST-T changes.  November 2014 ECG (independently read by me)::Normal sinus rhythm at 69 beats per minute. No ectopy. Normal intervals.  LABS:     Latest Ref Rng & Units 11/08/2022   12:14 PM 06/02/2022    2:09 PM 09/01/2021    9:49 AM  BMP  Glucose 70 - 99 mg/dL 94  86  96   BUN 8 - 27 mg/dL 16  14  18    Creatinine 0.76 - 1.27 mg/dL 1.61  0.96  0.45   BUN/Creat Ratio 10 - 24 16  SEE NOTE:  NOT APPLICABLE   Sodium 134 - 144 mmol/L 146  140  141   Potassium 3.5 - 5.2 mmol/L 4.4  3.9  4.1   Chloride 96 - 106 mmol/L 108  106  106   CO2 20 - 29 mmol/L 23  27  29    Calcium 8.6 - 10.2 mg/dL 9.3  8.9  9.3       Latest Ref Rng & Units 06/02/2022    2:09 PM 03/01/2021    9:00 AM 11/25/2019    8:04 AM  CBC  WBC 3.8 - 10.8 Thousand/uL 9.7  7.9  7.8   Hemoglobin 13.2 - 17.1 g/dL 40.9  81.1  91.4   Hematocrit 38.5 - 50.0 % 44.0  47.7  47.5   Platelets 140 - 400 Thousand/uL 205  204  220    Lab Results  Component Value Date   TSH 3.27 11/25/2019    Lab Results  Component Value Date   HGBA1C 5.4 03/01/2021      Latest Ref Rng & Units 11/08/2022   12:14 PM 09/01/2021    9:49 AM 12/02/2019    8:44 AM  Hepatic Function  Total Protein 6.0 - 8.5 g/dL 5.6  5.7  5.5   Albumin 3.8 - 4.8 g/dL 4.3   4.1   AST 0 - 40 IU/L 24  18  17    ALT 0 - 44 IU/L 25  21  19    Alk Phosphatase 44 - 121 IU/L 82   98   Total Bilirubin 0.0 - 1.2 mg/dL 0.4  0.5  0.3    Lipid Panel     Component Value Date/Time   CHOL 127 11/08/2022 1214   CHOL 191 07/22/2013 0835   TRIG 171 (H) 11/08/2022 1214   TRIG 253 (H) 07/22/2013 0835   HDL 38 (L) 11/08/2022 1214   HDL 38 (L) 07/22/2013 0835    CHOLHDL 3.3 11/08/2022 1214   CHOLHDL 3.6 03/30/2022 0858   VLDL 35 (H) 10/27/2016 0825   LDLCALC 60 11/08/2022 1214   LDLCALC 97 03/30/2022 0858   LDLCALC 102 (H) 07/22/2013 0835    RADIOLOGY: No results found.  IMPRESSION:  1. Essential hypertension   2. Hyperlipidemia with target LDL less than 70  3. Snoring   4. Systolic murmur   5. Morbid obesity (HCC)   6. Excessive daytime sleepiness   7. Statin intolerance      ASSESSMENT AND PLAN: Mr. Vanover is a 72 year-old gentleman who has a history of morbid obesity, hyperlipidemia, hypertension, and GERD.  He developed chest discomfort in 2015 which ultimately led to definitive cardiac catheterization which revealed normal coronary arteries.  He  underwent successful surgery by Dr. Dutch Quint involving C4-5 and C5-6 and tolerated this well from a cardiac standpoint.  He had developed some episodes of chest pain which most likely were of musculoskeletal etiology.  A CTA in 2020 showed minimal coronary plaque with a calcium score of 26.4  (20th percentile) involving his LAD ostium and OM1 vessel.  He also was noted to have atherosclerosis of his thoracic aorta.  His last echo Doppler study on June 11, 2021 showed normal LV function with EF 60 to 65% with normal wall motion and mild grade 1 diastolic dysfunction.  He had mildly elevated systolic pressure.  There was mild aortic valve sclerosis without stenosis.  There was mild aortic dilatation of the ascending aorta at 41 mm and aortic arch at 36 mm. He underwent successful knuckle replacement surgery of the right index finger.  At a prior office visit I had recommended bempedoic acid be added to Zetia.  Laboratory in March 2024 showed an LDL cholesterol at 60 with total cholesterol 127 triglycerides 171.  Apparently, he had been off nexlizet  for some time but he was just again given approval by his insurance company and will reinitiate therapy today.  hHs blood pressure today is stable on  current therapy.  I am concerned that he has untreated sleep apnea and again have suggested he undergo a split-night study for evaluation.  With his shortness of breath and aortic murmur I also have recommended he undergo a 2D echo Doppler study for further evaluation.  He will be following up with Dr. Alphonsus Sias for primary care.  I will see him in 6 months for reevaluation or sooner as needed.     Lennette Bihari, MD, [facility]  03/19/2023 3:08 PM

## 2023-03-15 NOTE — Patient Instructions (Addendum)
Medication Instructions:  No changes *If you need a refill on your cardiac medications before your next appointment, please call your pharmacy*   Lab Work: No changes If you have labs (blood work) drawn today and your tests are completely normal, you will receive your results only by: MyChart Message (if you have MyChart) OR A paper copy in the mail If you have any lab test that is abnormal or we need to change your treatment, we will call you to review the results.   Testing/Procedures: Your physician has requested that you have an echocardiogram. Echocardiography is a painless test that uses sound waves to create images of your heart. It provides your doctor with information about the size and shape of your heart and how well your heart's chambers and valves are working. This procedure takes approximately one hour. There are no restrictions for this procedure. Please do NOT wear cologne, aftershave, or lotions (deodorant is allowed).  Please arrive 15 minutes prior to your appointment time.   Your physician has recommended that you have a sleep study. This test records several body functions during sleep, including: brain activity, eye movement, oxygen and carbon dioxide blood levels, heart rate and rhythm, breathing rate and rhythm, the flow of air through your mouth and nose, snoring, body muscle movements, and chest and belly movement.    Follow-Up: At [facility], you and your health needs are our priority.  As part of our continuing mission to provide you with exceptional heart care, we have created designated Provider Care Teams.  These Care Teams include your primary Cardiologist (physician) and Advanced Practice Providers (APPs -  Physician Assistants and Nurse Practitioners) who all work together to provide you with the care you need, when you need it.  We recommend signing up for the patient portal called "MyChart".  Sign up information is provided on this After Visit  Summary.  MyChart is used to connect with patients for Virtual Visits (Telemedicine).  Patients are able to view lab/test results, encounter notes, upcoming appointments, etc.  Non-urgent messages can be sent to your provider as well.   To learn more about what you can do with MyChart, go to [email or web address].    Your next appointment:   3 month(s) if your receive a CPAP machine, you will return for 90 day compliance. If you do not receive a CPAP machine, you will return in March 2025. A letter will be mailed to you as a reminder to call the office for that appointment.  Provider:   Nicki Guadalajara, MD

## 2023-03-19 ENCOUNTER — Encounter: Payer: Self-pay | Admitting: Cardiovascular Disease

## 2023-03-22 ENCOUNTER — Ambulatory Visit
Admission: RE | Admit: 2023-03-22 | Discharge: 2023-03-22 | Disposition: A | Discharge status: Home / Self Care | Payer: PPO | Source: Ambulatory Visit | Attending: Internal Medicine | Admitting: Internal Medicine

## 2023-03-22 DIAGNOSIS — K420 Umbilical hernia with obstruction, without gangrene: Secondary | ICD-10-CM

## 2023-03-28 ENCOUNTER — Other Ambulatory Visit (HOSPITAL_COMMUNITY): Payer: Self-pay

## 2023-03-31 ENCOUNTER — Encounter: Payer: Self-pay | Admitting: Internal Medicine

## 2023-03-31 DIAGNOSIS — K429 Umbilical hernia without obstruction or gangrene: Secondary | ICD-10-CM

## 2023-04-03 ENCOUNTER — Encounter: Payer: Self-pay | Admitting: *Deleted

## 2023-04-06 ENCOUNTER — Ambulatory Visit (HOSPITAL_COMMUNITY): Payer: PPO | Attending: Cardiovascular Disease

## 2023-04-06 DIAGNOSIS — I422 Other hypertrophic cardiomyopathy: Secondary | ICD-10-CM | POA: Diagnosis not present

## 2023-04-06 DIAGNOSIS — R011 Cardiac murmur, unspecified: Secondary | ICD-10-CM | POA: Insufficient documentation

## 2023-04-06 LAB — ECHOCARDIOGRAM COMPLETE
Area-P 1/2: 3.34 cm2
S' Lateral: 2.7 cm

## 2023-04-18 ENCOUNTER — Ambulatory Visit: Payer: Self-pay | Admitting: General Surgery

## 2023-04-18 ENCOUNTER — Telehealth: Payer: Self-pay | Admitting: Pharmacy Technician

## 2023-04-18 ENCOUNTER — Other Ambulatory Visit (HOSPITAL_COMMUNITY): Payer: Self-pay

## 2023-04-18 NOTE — Telephone Encounter (Signed)
Pharmacy Patient Advocate Encounter   Received notification from CoverMyMeds that prior authorization for Nexlizet 180-10MG  tablets is required/requested.   Insurance verification completed.   The patient is insured through  [employer] medicare  .   Per test claim:  Prior auth not needed at this time

## 2023-04-26 NOTE — Pre-Procedure Instructions (Signed)
Surgical Instructions   Your procedure is scheduled on April 28, 2023. Report to [facility] Main Entrance "A" at 7:30 A.M., then check in with the Admitting office. Any questions or running late day of surgery: call [phone]  Questions prior to your surgery date: call [phone], Monday-Friday, 8am-4pm. If you experience any cold or flu symptoms such as cough, fever, chills, shortness of breath, etc. between now and your scheduled surgery, please notify us at the above number.     Remember:  Do not eat after midnight the night before your surgery   You may drink clear liquids until 6:30 AM the morning of your surgery.   Clear liquids allowed are: Water, Non-Citrus Juices (without pulp), Carbonated Beverages, Clear Tea, Black Coffee Only (NO MILK, CREAM OR POWDERED CREAMER of any kind), and Gatorade.    Take these medicines the morning of surgery with A SIP OF WATER: amLODipine (NORVASC)  carvedilol (COREG)  brimonidine (ALPHAGAN P) eye drops dorzolamide-timolol (COSOPT) eye drops finasteride (PROSCAR)  NEXLIZET  pantoprazole (PROTONIX)    May take these medicines IF NEEDED: Polyvinyl Alcohol-Povidone (REFRESH OP) eye drops   Follow your surgeon's instructions on when to stop Aspirin.  If no instructions were given by your surgeon then you will need to call the office to get those instructions.     One week prior to surgery, STOP taking any Aleve, Naproxen, Ibuprofen, Motrin, Advil, Goody's, BC's, all herbal medications, fish oil, and non-prescription vitamins. This includes your medication: meloxicam (MOBIC)                      Do NOT Smoke (Tobacco/Vaping) for 24 hours prior to your procedure.  If you use a CPAP at night, you may bring your mask/headgear for your overnight stay.   You will be asked to remove any contacts, glasses, piercing's, hearing aid's, dentures/partials prior to surgery. Please bring cases for these items if needed.    Patients discharged  the day of surgery will not be allowed to drive home, and someone needs to stay with them for 24 hours.  SURGICAL WAITING ROOM VISITATION Patients may have no more than 2 support people in the waiting area - these visitors may rotate.   Pre-op nurse will coordinate an appropriate time for 1 ADULT support person, who may not rotate, to accompany patient in pre-op.  Children under the age of 8 must have an adult with them who is not the patient and must remain in the main waiting area with an adult.  If the patient needs to stay at the hospital during part of their recovery, the visitor guidelines for inpatient rooms apply.  Please refer to the [facility] website for the visitor guidelines for any additional information.   If you received a COVID test during your pre-op visit  it is requested that you wear a mask when out in public, stay away from anyone that may not be feeling well and notify your surgeon if you develop symptoms. If you have been in contact with anyone that has tested positive in the last 10 days please notify you surgeon.      Pre-operative CHG Bathing Instructions   You can play a key role in reducing the risk of infection after surgery. Your skin needs to be as free of germs as possible. You can reduce the number of germs on your skin by washing with CHG (chlorhexidine gluconate) soap before surgery. CHG is an antiseptic soap that kills germs and  continues to kill germs even after washing.   DO NOT use if you have an allergy to chlorhexidine/CHG or antibacterial soaps. If your skin becomes reddened or irritated, stop using the CHG and notify one of our RNs at [phone].              TAKE A SHOWER THE NIGHT BEFORE SURGERY AND THE DAY OF SURGERY    Please keep in mind the following:  DO NOT shave, including legs and underarms, 48 hours prior to surgery.   You may shave your face before/day of surgery.  Place clean sheets on your bed the night before surgery Use a  clean washcloth (not used since being washed) for each shower. DO NOT sleep with pet's night before surgery.  CHG Shower Instructions:  If you choose to wash your hair and private area, wash first with your normal shampoo/soap.  After you use shampoo/soap, rinse your hair and body thoroughly to remove shampoo/soap residue.  Turn the water OFF and apply half the bottle of CHG soap to a CLEAN washcloth.  Apply CHG soap ONLY FROM YOUR NECK DOWN TO YOUR TOES (washing for 3-5 minutes)  DO NOT use CHG soap on face, private areas, open wounds, or sores.  Pay special attention to the area where your surgery is being performed.  If you are having back surgery, having someone wash your back for you may be helpful. Wait 2 minutes after CHG soap is applied, then you may rinse off the CHG soap.  Pat dry with a clean towel  Put on clean pajamas    Additional instructions for the day of surgery: DO NOT APPLY any lotions, deodorants, cologne, or perfumes.   Do not wear jewelry or makeup Do not wear nail polish, gel polish, artificial nails, or any other type of covering on natural nails (fingers and toes) Do not bring valuables to the hospital. [facility] is not responsible for valuables/personal belongings. Put on clean/comfortable clothes.  Please brush your teeth.  Ask your nurse before applying any prescription medications to the skin.

## 2023-04-27 ENCOUNTER — Other Ambulatory Visit: Payer: Self-pay

## 2023-04-27 ENCOUNTER — Encounter (HOSPITAL_COMMUNITY)
Admission: RE | Admit: 2023-04-27 | Discharge: 2023-04-27 | Disposition: A | Discharge status: Home / Self Care | Payer: PPO | Source: Ambulatory Visit | Attending: General Surgery | Admitting: General Surgery

## 2023-04-27 ENCOUNTER — Encounter (HOSPITAL_COMMUNITY): Payer: Self-pay

## 2023-04-27 VITALS — BP 141/70 | HR 69 | Temp 98.0°F | Resp 18 | Ht 70.0 in | Wt 291.8 lb

## 2023-04-27 DIAGNOSIS — E785 Hyperlipidemia, unspecified: Secondary | ICD-10-CM | POA: Insufficient documentation

## 2023-04-27 DIAGNOSIS — Z01812 Encounter for preprocedural laboratory examination: Secondary | ICD-10-CM | POA: Insufficient documentation

## 2023-04-27 DIAGNOSIS — Z01818 Encounter for other preprocedural examination: Secondary | ICD-10-CM

## 2023-04-27 DIAGNOSIS — I2510 Atherosclerotic heart disease of native coronary artery without angina pectoris: Secondary | ICD-10-CM | POA: Insufficient documentation

## 2023-04-27 DIAGNOSIS — I10 Essential (primary) hypertension: Secondary | ICD-10-CM | POA: Insufficient documentation

## 2023-04-27 LAB — BASIC METABOLIC PANEL
Anion gap: 11 (ref 5–15)
BUN: 18 mg/dL (ref 8–23)
CO2: 26 mmol/L (ref 22–32)
Calcium: 9.2 mg/dL (ref 8.9–10.3)
Chloride: 105 mmol/L (ref 98–111)
Creatinine, Ser: 1.07 mg/dL (ref 0.61–1.24)
GFR, Estimated: >60 mL/min (ref >60)
Glucose, Bld: 100 mg/dL — ABNORMAL HIGH (ref 70–99)
Potassium: 3.8 mmol/L (ref 3.5–5.1)
Sodium: 142 mmol/L (ref 135–145)

## 2023-04-27 LAB — CBC
HCT: 44.5 % (ref 39.0–52.0)
Hemoglobin: 15 g/dL (ref 13.0–17.0)
MCH: 29.9 pg (ref 26.0–34.0)
MCHC: 33.7 g/dL (ref 30.0–36.0)
MCV: 88.8 fL (ref 80.0–100.0)
Platelets: 218 10*3/uL (ref 150–400)
RBC: 5.01 MIL/uL (ref 4.22–5.81)
RDW: 13.2 % (ref 11.5–15.5)
WBC: 8.3 10*3/uL (ref 4.0–10.5)
nRBC: 0 % (ref 0.0–0.2)

## 2023-04-27 NOTE — Progress Notes (Signed)
PCP - Dr. Tillman Abide Cardiologist - Dr. Nicki Guadalajara - Last office visit 03/15/2023  PPM/ICD - Denies Device Orders - n/a Rep Notified - n/a  Chest x-ray - Denies EKG - 03/15/2023 Stress Test - 08/29/2013 ECHO - 04/06/2023 Cardiac Cath - 09/05/2013  Sleep Study - Pt is waiting for a call to set up a sleep study per Dr. Tresa Endo CPAP - n/a  No DM  Last dose of GLP1 agonist- n/a GLP1 instructions: n/a  Blood Thinner Instructions: n/a Aspirin Instructions: Last dose of ASA 04/23/2023  ERAS Protcol - Clear liquids until 0630 morning of surgery PRE-SURGERY Ensure or G2- n/a  COVID TEST- n/a   Anesthesia review: Yes. Pt recently evaluated by ENT for voice hoarseness. 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 Antionette Poles, PA-C made aware and will review.   Patient denies shortness of breath, fever, cough and chest pain at PAT appointment. Pt denies any respiratory illness/infection in the last two months.   All instructions explained to the patient, with a verbal understanding of the material. Patient agrees to go over the instructions while at home for a better understanding. Patient also instructed to self quarantine after being tested for COVID-19. The opportunity to ask questions was provided.

## 2023-04-27 NOTE — Progress Notes (Signed)
Anesthesia Chart Review:  Follows with cardiology for history of HLD, HTN, atypical chest pain with prior negative workups.  Cath 2015 revealed normal coronary arteries.  CTA in 2020 showed minimal coronary plaque with a calcium score of 26.4 (20 percentile).  Echo 04/06/2023 showed EF 65 to 70%, no significant valvular abnormalities.  Last seen by Dr. Tresa Endo 03/15/2023, split-night study was ordered for evaluation of possible OSA, has not been completed yet.  Otherwise patient was stable from cardiac standpoint.  18-month follow-up recommended.  Patient recently had evaluation by ENT at [facility] for chronic hoarseness.  Seen by Dr. Jenne Pane on 11/07/2022.  Per note, "Fiberoptic exam of the larynx is largely unremarkable and he was reassured. Like an exam in 2017, I think his voice problem is due to some atrophy of the vocal folds. As then, we discussed speech therapy and injection augmentation as options for management. He will consider options and call back."  Preop labs reviewed, WNL.  EKG 03/15/2023: NSR.  Rate 67.  TTE 04/06/2023:  1. Poor acoustic windows limit study.   2. Left ventricular ejection fraction, by estimation, is 65 to 70%. The  left ventricle has normal function. The left ventricle has no regional  wall motion abnormalities. There is mild eccentric left ventricular  hypertrophy. Left ventricular diastolic  parameters are indeterminate. The average left ventricular global  longitudinal strain is -20.5 %. The global longitudinal strain is normal.   3. Right ventricular systolic function is normal. The right ventricular  size is normal.   4. The mitral valve is normal in structure. Trivial mitral valve  regurgitation.   5. The aortic valve is tricuspid. Aortic valve regurgitation is not  visualized. Aortic valve sclerosis/calcification is present, without any  evidence of aortic stenosis.   6. The inferior vena cava is normal in size with greater than 50%  respiratory variability,  suggesting right atrial pressure of 3 mmHg.   Coronary CTA 09/14/2018: IMPRESSION: 1. Coronary calcium score of 26.4. This was 28th percentile for age and sex matched control.   2. Normal coronary origin with right dominance.   3. Minimal plaque in ostial LAD and OM1.    Zannie Cove [facility]/Anesthesiology Phone [phone] 04/27/2023 4:19 PM

## 2023-04-27 NOTE — Anesthesia Preprocedure Evaluation (Addendum)
Anesthesia Evaluation  Patient identified by MRN, date of birth, ID band Patient awake    Reviewed: Allergy & Precautions, NPO status , Patient's Chart, lab work & pertinent test results, reviewed documented beta blocker date and time   Airway Mallampati: III  TM Distance: <3 FB Neck ROM: Full    Dental  (+) Teeth Intact, Dental Advisory Given   Pulmonary neg pulmonary ROS   Pulmonary exam normal breath sounds clear to auscultation       Cardiovascular hypertension, Pt. on medications and Pt. on home beta blockers Normal cardiovascular exam Rhythm:Regular Rate:Normal     Neuro/Psych  Neuromuscular disease    GI/Hepatic Neg liver ROS, hiatal hernia,GERD  Medicated,,VENTRAL HERNIA   Endo/Other    Morbid obesity  Renal/GU negative Renal ROS     Musculoskeletal  (+) Arthritis ,    Abdominal   Peds  Hematology negative hematology ROS (+)   Anesthesia Other Findings Day of surgery medications reviewed with the patient.  Reproductive/Obstetrics                             Anesthesia Physical Anesthesia Plan  ASA: 3  Anesthesia Plan: General   Post-op Pain Management: Tylenol PO (pre-op)*   Induction: Intravenous  PONV Risk Score and Plan: 3 and Dexamethasone and Ondansetron  Airway Management Planned: Video Laryngoscope Planned and Oral ETT  Additional Equipment:   Intra-op Plan:   Post-operative Plan: Extubation in OR  Informed Consent: I have reviewed the patients History and Physical, chart, labs and discussed the procedure including the risks, benefits and alternatives for the proposed anesthesia with the patient or authorized representative who has indicated his/her understanding and acceptance.     Dental advisory given  Plan Discussed with: CRNA  Anesthesia Plan Comments: (PAT note by Antionette Poles, PA-C:  Follows with cardiology for history of HLD, HTN, atypical chest  pain with prior negative workups.  Cath 2015 revealed normal coronary arteries.  CTA in 2020 showed minimal coronary plaque with a calcium score of 26.4 (20 percentile).  Echo 04/06/2023 showed EF 65 to 70%, no significant valvular abnormalities.  Last seen by Dr. Tresa Endo 03/15/2023, split-night study was ordered for evaluation of possible OSA, has not been completed yet.  Otherwise patient was stable from cardiac standpoint.  48-month follow-up recommended.  Patient recently had evaluation by ENT at [facility] for chronic hoarseness.  Seen by Dr. Jenne Pane on 11/07/2022.  Per note, "Fiberoptic exam of the larynx is largely unremarkable and he was reassured. Like an exam in 2017, I think his voice problem is due to some atrophy of the vocal folds. As then, we discussed speech therapy and injection augmentation as options for management. He will consider options and call back."  Preop labs reviewed, WNL.  EKG 03/15/2023: NSR.  Rate 67.  TTE 04/06/2023: 1. Poor acoustic windows limit study.  2. Left ventricular ejection fraction, by estimation, is 65 to 70%. The  left ventricle has normal function. The left ventricle has no regional  wall motion abnormalities. There is mild eccentric left ventricular  hypertrophy. Left ventricular diastolic  parameters are indeterminate. The average left ventricular global  longitudinal strain is -20.5 %. The global longitudinal strain is normal.  3. Right ventricular systolic function is normal. The right ventricular  size is normal.  4. The mitral valve is normal in structure. Trivial mitral valve  regurgitation.  5. The aortic valve is tricuspid. Aortic valve regurgitation is not  visualized. Aortic valve sclerosis/calcification is present, without any  evidence of aortic stenosis.  6. The inferior vena cava is normal in size with greater than 50%  respiratory variability, suggesting right atrial pressure of 3 mmHg.   Coronary CTA 09/14/2018: IMPRESSION: 1.  Coronary calcium score of 26.4. This was 28th percentile for age and sex matched control.  2. Normal coronary origin with right dominance.  3. Minimal plaque in ostial LAD and OM1.  )        Anesthesia Quick Evaluation

## 2023-04-28 ENCOUNTER — Ambulatory Visit (HOSPITAL_COMMUNITY)
Admission: RE | Admit: 2023-04-28 | Discharge: 2023-04-29 | Disposition: A | Discharge status: Home / Self Care | Payer: PPO | Source: Ambulatory Visit | Attending: General Surgery | Admitting: General Surgery

## 2023-04-28 ENCOUNTER — Encounter (HOSPITAL_COMMUNITY)
Admission: RE | Disposition: A | Discharge status: Home / Self Care | Payer: Self-pay | Source: Ambulatory Visit | Attending: General Surgery

## 2023-04-28 ENCOUNTER — Other Ambulatory Visit: Payer: Self-pay

## 2023-04-28 ENCOUNTER — Ambulatory Visit (HOSPITAL_COMMUNITY): Payer: PPO | Admitting: Physician Assistant

## 2023-04-28 ENCOUNTER — Ambulatory Visit (HOSPITAL_BASED_OUTPATIENT_CLINIC_OR_DEPARTMENT_OTHER): Payer: PPO | Admitting: Anesthesiology

## 2023-04-28 ENCOUNTER — Encounter (HOSPITAL_COMMUNITY): Payer: Self-pay | Admitting: General Surgery

## 2023-04-28 DIAGNOSIS — K219 Gastro-esophageal reflux disease without esophagitis: Secondary | ICD-10-CM | POA: Diagnosis not present

## 2023-04-28 DIAGNOSIS — I10 Essential (primary) hypertension: Secondary | ICD-10-CM | POA: Diagnosis not present

## 2023-04-28 DIAGNOSIS — K449 Diaphragmatic hernia without obstruction or gangrene: Secondary | ICD-10-CM | POA: Diagnosis not present

## 2023-04-28 DIAGNOSIS — Z79899 Other long term (current) drug therapy: Secondary | ICD-10-CM | POA: Diagnosis not present

## 2023-04-28 DIAGNOSIS — K439 Ventral hernia without obstruction or gangrene: Secondary | ICD-10-CM

## 2023-04-28 DIAGNOSIS — Z6841 Body Mass Index (BMI) 40.0 and over, adult: Secondary | ICD-10-CM | POA: Diagnosis not present

## 2023-04-28 DIAGNOSIS — Z8719 Personal history of other diseases of the digestive system: Secondary | ICD-10-CM

## 2023-04-28 DIAGNOSIS — E782 Mixed hyperlipidemia: Secondary | ICD-10-CM | POA: Diagnosis not present

## 2023-04-28 HISTORY — PX: VENTRAL HERNIA REPAIR: SHX424

## 2023-04-28 SURGERY — REPAIR, HERNIA, VENTRAL, LAPAROSCOPIC
Anesthesia: General

## 2023-04-28 MED ORDER — IPRATROPIUM-ALBUTEROL 0.5-2.5 (3) MG/3ML IN SOLN
RESPIRATORY_TRACT | Status: AC
  Filled 2023-04-28: qty 3

## 2023-04-28 MED ORDER — CEFAZOLIN IN SODIUM CHLORIDE 3-0.9 GM/100ML-% IV SOLN
INTRAVENOUS | Status: AC
  Filled 2023-04-28: qty 100

## 2023-04-28 MED ORDER — GABAPENTIN 100 MG PO CAPS: 100.0000 mg | ORAL_CAPSULE | ORAL | Status: AC

## 2023-04-28 MED ORDER — CHLORHEXIDINE GLUCONATE 0.12 % MT SOLN: 15.0000 mL | Freq: Once | OROMUCOSAL | Status: AC

## 2023-04-28 MED ORDER — FENTANYL CITRATE (PF) 100 MCG/2ML IJ SOLN
25.0000 ug | INTRAMUSCULAR | Status: DC | PRN
  Administered 2023-04-28 (×2): 25 ug via INTRAVENOUS

## 2023-04-28 MED ORDER — SUGAMMADEX SODIUM 200 MG/2ML IV SOLN
INTRAVENOUS | Status: DC | PRN
  Administered 2023-04-28: 200 mg via INTRAVENOUS

## 2023-04-28 MED ORDER — IPRATROPIUM-ALBUTEROL 0.5-2.5 (3) MG/3ML IN SOLN
3.0000 mL | Freq: Once | RESPIRATORY_TRACT | Status: AC
  Administered 2023-04-28: 3 mL via RESPIRATORY_TRACT

## 2023-04-28 MED ORDER — LIDOCAINE 2% (20 MG/ML) 5 ML SYRINGE
INTRAMUSCULAR | Status: DC | PRN
  Administered 2023-04-28: 80 mg via INTRAVENOUS

## 2023-04-28 MED ORDER — PANTOPRAZOLE SODIUM 40 MG PO TBEC
40.0000 mg | DELAYED_RELEASE_TABLET | Freq: Every day | ORAL | Status: DC
  Administered 2023-04-28: 40 mg via ORAL
  Filled 2023-04-28: qty 1

## 2023-04-28 MED ORDER — PROPOFOL 10 MG/ML IV BOLUS
INTRAVENOUS | Status: AC
  Filled 2023-04-28: qty 20

## 2023-04-28 MED ORDER — PROPOFOL 10 MG/ML IV BOLUS
INTRAVENOUS | Status: DC | PRN
  Administered 2023-04-28: 190 mg via INTRAVENOUS

## 2023-04-28 MED ORDER — OXYCODONE HCL 5 MG PO TABS
ORAL_TABLET | ORAL | Status: AC
  Filled 2023-04-28: qty 1

## 2023-04-28 MED ORDER — OXYCODONE HCL 5 MG PO TABS
5.0000 mg | ORAL_TABLET | Freq: Once | ORAL | Status: AC
  Administered 2023-04-28: 5 mg via ORAL

## 2023-04-28 MED ORDER — CHLORHEXIDINE GLUCONATE 0.12 % MT SOLN
OROMUCOSAL | Status: AC
  Administered 2023-04-28: 15 mL via OROMUCOSAL
  Filled 2023-04-28: qty 15

## 2023-04-28 MED ORDER — FENTANYL CITRATE (PF) 250 MCG/5ML IJ SOLN
INTRAMUSCULAR | Status: AC
  Filled 2023-04-28: qty 5

## 2023-04-28 MED ORDER — LACTATED RINGERS IV SOLN: INTRAVENOUS | Status: DC

## 2023-04-28 MED ORDER — FENTANYL CITRATE (PF) 100 MCG/2ML IJ SOLN
INTRAMUSCULAR | Status: AC
  Filled 2023-04-28: qty 2

## 2023-04-28 MED ORDER — TRAMADOL HCL 50 MG PO TABS
50.0000 mg | ORAL_TABLET | Freq: Four times a day (QID) | ORAL | Status: DC | PRN
  Administered 2023-04-28 – 2023-04-29 (×2): 50 mg via ORAL
  Filled 2023-04-28 (×2): qty 1

## 2023-04-28 MED ORDER — ONDANSETRON HCL 4 MG/2ML IJ SOLN
INTRAMUSCULAR | Status: AC
  Filled 2023-04-28: qty 2

## 2023-04-28 MED ORDER — DORZOLAMIDE HCL-TIMOLOL MAL 2-0.5 % OP SOLN
2.0000 [drp] | Freq: Two times a day (BID) | OPHTHALMIC | Status: DC
  Administered 2023-04-28: 2 [drp] via OPHTHALMIC
  Filled 2023-04-28: qty 10

## 2023-04-28 MED ORDER — LIDOCAINE 2% (20 MG/ML) 5 ML SYRINGE
INTRAMUSCULAR | Status: AC
  Filled 2023-04-28: qty 5

## 2023-04-28 MED ORDER — BUPIVACAINE-EPINEPHRINE 0.25% -1:200000 IJ SOLN
INTRAMUSCULAR | Status: DC | PRN
  Administered 2023-04-28: 15 mL

## 2023-04-28 MED ORDER — FENTANYL CITRATE PF 50 MCG/ML IJ SOSY: 25.0000 ug | PREFILLED_SYRINGE | INTRAMUSCULAR | Status: DC | PRN

## 2023-04-28 MED ORDER — GABAPENTIN 100 MG PO CAPS
ORAL_CAPSULE | ORAL | Status: AC
  Administered 2023-04-28: 100 mg via ORAL
  Filled 2023-04-28: qty 1

## 2023-04-28 MED ORDER — AMLODIPINE BESYLATE 10 MG PO TABS: 10.0000 mg | ORAL_TABLET | Freq: Every day | ORAL | Status: DC

## 2023-04-28 MED ORDER — DEXAMETHASONE SODIUM PHOSPHATE 10 MG/ML IJ SOLN
INTRAMUSCULAR | Status: AC
  Filled 2023-04-28: qty 1

## 2023-04-28 MED ORDER — DEXAMETHASONE SODIUM PHOSPHATE 10 MG/ML IJ SOLN
INTRAMUSCULAR | Status: DC | PRN
  Administered 2023-04-28: 10 mg via INTRAVENOUS

## 2023-04-28 MED ORDER — IRBESARTAN 150 MG PO TABS: 150.0000 mg | ORAL_TABLET | Freq: Every day | ORAL | Status: DC

## 2023-04-28 MED ORDER — CHLORHEXIDINE GLUCONATE CLOTH 2 % EX PADS: 6.0000 | MEDICATED_PAD | Freq: Once | CUTANEOUS | Status: DC

## 2023-04-28 MED ORDER — FENTANYL CITRATE (PF) 250 MCG/5ML IJ SOLN
INTRAMUSCULAR | Status: DC | PRN
  Administered 2023-04-28: 50 ug via INTRAVENOUS
  Administered 2023-04-28 (×2): 100 ug via INTRAVENOUS

## 2023-04-28 MED ORDER — ONDANSETRON HCL 4 MG/2ML IJ SOLN
INTRAMUSCULAR | Status: DC | PRN
  Administered 2023-04-28: 4 mg via INTRAVENOUS

## 2023-04-28 MED ORDER — ACETAMINOPHEN 500 MG PO TABS: 1000.0000 mg | ORAL_TABLET | ORAL | Status: AC

## 2023-04-28 MED ORDER — CARVEDILOL 6.25 MG PO TABS
6.2500 mg | ORAL_TABLET | Freq: Two times a day (BID) | ORAL | Status: DC
  Administered 2023-04-28: 6.25 mg via ORAL
  Filled 2023-04-28: qty 1

## 2023-04-28 MED ORDER — PHENYLEPHRINE 80 MCG/ML (10ML) SYRINGE FOR IV PUSH (FOR BLOOD PRESSURE SUPPORT)
PREFILLED_SYRINGE | INTRAVENOUS | Status: DC | PRN
  Administered 2023-04-28: 80 ug via INTRAVENOUS

## 2023-04-28 MED ORDER — ORAL CARE MOUTH RINSE: 15.0000 mL | Freq: Once | OROMUCOSAL | Status: AC

## 2023-04-28 MED ORDER — FINASTERIDE 5 MG PO TABS: 5.0000 mg | ORAL_TABLET | Freq: Every day | ORAL | Status: DC

## 2023-04-28 MED ORDER — CELECOXIB 200 MG PO CAPS: 200.0000 mg | ORAL_CAPSULE | ORAL | Status: AC

## 2023-04-28 MED ORDER — ONDANSETRON HCL 4 MG/2ML IJ SOLN
4.0000 mg | Freq: Once | INTRAMUSCULAR | Status: AC | PRN
  Administered 2023-04-28: 4 mg via INTRAVENOUS

## 2023-04-28 MED ORDER — OXYCODONE HCL 5 MG PO TABS
5.0000 mg | ORAL_TABLET | Freq: Four times a day (QID) | ORAL | 0 refills | Status: DC | PRN
Start: 2023-04-28 — End: 2023-07-24

## 2023-04-28 MED ORDER — ACETAMINOPHEN 500 MG PO TABS
ORAL_TABLET | ORAL | Status: AC
  Administered 2023-04-28: 1000 mg via ORAL
  Filled 2023-04-28: qty 2

## 2023-04-28 MED ORDER — ROCURONIUM BROMIDE 10 MG/ML (PF) SYRINGE
PREFILLED_SYRINGE | INTRAVENOUS | Status: AC
  Filled 2023-04-28: qty 10

## 2023-04-28 MED ORDER — ROCURONIUM BROMIDE 10 MG/ML (PF) SYRINGE
PREFILLED_SYRINGE | INTRAVENOUS | Status: DC | PRN
  Administered 2023-04-28: 60 mg via INTRAVENOUS

## 2023-04-28 MED ORDER — CELECOXIB 200 MG PO CAPS
ORAL_CAPSULE | ORAL | Status: AC
  Administered 2023-04-28: 200 mg via ORAL
  Filled 2023-04-28: qty 1

## 2023-04-28 MED ORDER — CEFAZOLIN IN SODIUM CHLORIDE 3-0.9 GM/100ML-% IV SOLN
3.0000 g | INTRAVENOUS | Status: AC
  Administered 2023-04-28: 3 g via INTRAVENOUS

## 2023-04-28 MED ORDER — SPIRONOLACTONE 25 MG PO TABS: 25.0000 mg | ORAL_TABLET | Freq: Every day | ORAL | Status: DC

## 2023-04-28 MED ORDER — FUROSEMIDE 40 MG PO TABS: 40.0000 mg | ORAL_TABLET | Freq: Every day | ORAL | Status: DC

## 2023-04-28 SURGICAL SUPPLY — 51 items
ADH SKN CLS APL DERMABOND .7 (GAUZE/BANDAGES/DRESSINGS) ×1
APL PRP STRL LF DISP 70% ISPRP (MISCELLANEOUS) ×1
BAG COUNTER SPONGE SURGICOUNT (BAG) ×2 IMPLANT
BAG SPNG CNTER NS LX DISP (BAG) ×1
BINDER ABDOMINAL 12 ML 46-62 (SOFTGOODS) IMPLANT
BNDG GAUZE DERMACEA FLUFF 4 (GAUZE/BANDAGES/DRESSINGS) IMPLANT
BNDG GZE DERMACEA 4 6PLY (GAUZE/BANDAGES/DRESSINGS)
CANISTER SUCT 3000ML PPV (MISCELLANEOUS) IMPLANT
CHLORAPREP W/TINT 26 (MISCELLANEOUS) ×2 IMPLANT
COVER SURGICAL LIGHT HANDLE (MISCELLANEOUS) ×2 IMPLANT
DERMABOND ADVANCED .7 DNX12 (GAUZE/BANDAGES/DRESSINGS) ×2 IMPLANT
DEVICE SECURE STRAP 25 ABSORB (INSTRUMENTS) ×2 IMPLANT
DEVICE TROCAR PUNCTURE CLOSURE (ENDOMECHANICALS) ×2 IMPLANT
DRAPE INCISE IOBAN 66X45 STRL (DRAPES) ×2 IMPLANT
DRAPE LAPAROSCOPIC ABDOMINAL (DRAPES) ×2 IMPLANT
ELECT CAUTERY BLADE 6.4 (BLADE) ×2 IMPLANT
ELECT REM PT RETURN 9FT ADLT (ELECTROSURGICAL) ×1
ELECTRODE REM PT RTRN 9FT ADLT (ELECTROSURGICAL) ×2 IMPLANT
GLOVE BIO SURGEON STRL SZ7.5 (GLOVE) ×2 IMPLANT
GOWN STRL REUS W/ TWL LRG LVL3 (GOWN DISPOSABLE) ×6 IMPLANT
GOWN STRL REUS W/TWL LRG LVL3 (GOWN DISPOSABLE) ×3
IRRIG SUCT STRYKERFLOW 2 WTIP (MISCELLANEOUS)
IRRIGATION SUCT STRKRFLW 2 WTP (MISCELLANEOUS) IMPLANT
KIT BASIN OR (CUSTOM PROCEDURE TRAY) ×2 IMPLANT
KIT TURNOVER KIT B (KITS) ×2 IMPLANT
MARKER SKIN DUAL TIP RULER LAB (MISCELLANEOUS) ×2 IMPLANT
MESH OVITEX LPR PERM 9X9 4L (Mesh General) IMPLANT
NDL SPNL 22GX3.5 QUINCKE BK (NEEDLE) ×2 IMPLANT
NEEDLE SPNL 22GX3.5 QUINCKE BK (NEEDLE) ×1
NS IRRIG 1000ML POUR BTL (IV SOLUTION) ×2 IMPLANT
PAD ARMBOARD 7.5X6 YLW CONV (MISCELLANEOUS) ×4 IMPLANT
PENCIL BUTTON HOLSTER BLD 10FT (ELECTRODE) ×2 IMPLANT
SCISSORS LAP 5X35 DISP (ENDOMECHANICALS) IMPLANT
SET TUBE SMOKE EVAC HIGH FLOW (TUBING) ×2 IMPLANT
SHEARS HARMONIC ACE PLUS 36CM (ENDOMECHANICALS) IMPLANT
SLEEVE Z-THREAD 5X100MM (TROCAR) ×2 IMPLANT
SUT MNCRL AB 4-0 PS2 18 (SUTURE) ×2 IMPLANT
SUT NOVA NAB DX-16 0-1 5-0 T12 (SUTURE) ×2 IMPLANT
SUT VIC AB 3-0 SH 27 (SUTURE) ×2
SUT VIC AB 3-0 SH 27X BRD (SUTURE) IMPLANT
SUT VIC AB 3-0 SH 27XBRD (SUTURE) ×2 IMPLANT
TOWEL GREEN STERILE (TOWEL DISPOSABLE) ×2 IMPLANT
TOWEL GREEN STERILE FF (TOWEL DISPOSABLE) ×2 IMPLANT
TRAY FOLEY W/BAG SLVR 16FR (SET/KITS/TRAYS/PACK) ×1
TRAY FOLEY W/BAG SLVR 16FR ST (SET/KITS/TRAYS/PACK) ×2 IMPLANT
TRAY LAPAROSCOPIC MC (CUSTOM PROCEDURE TRAY) ×2 IMPLANT
TROCAR 11X100 Z THREAD (TROCAR) IMPLANT
TROCAR BALLN 12MMX100 BLUNT (TROCAR) IMPLANT
TROCAR Z-THREAD OPTICAL 5X100M (TROCAR) ×2 IMPLANT
WARMER LAPAROSCOPE (MISCELLANEOUS) ×2 IMPLANT
WATER STERILE IRR 1000ML POUR (IV SOLUTION) ×2 IMPLANT

## 2023-04-28 NOTE — Op Note (Signed)
04/28/2023  10:38 AM  PATIENT:  Eulis Canner  72 y.o. male  PRE-OPERATIVE DIAGNOSIS:  VENTRAL HERNIA  POST-OPERATIVE DIAGNOSIS:  VENTRAL HERNIA  PROCEDURE:  Procedure(s): LAPAROSCOPIC ASSISTED VENTRAL HERNIA REPAIR WITH MESH (N/A)  SURGEON:  Surgeons and Role:    * Griselda Miner, MD - Primary  PHYSICIAN ASSISTANT:   ASSISTANTS: Patrici Ranks, RNFA   ANESTHESIA:   local and general  EBL:  minimal   BLOOD ADMINISTERED:none  DRAINS: none   LOCAL MEDICATIONS USED:  MARCAINE     SPECIMEN:  Source of Specimen:  hernia sac  DISPOSITION OF SPECIMEN:  PATHOLOGY  COUNTS:  YES  TOURNIQUET:  * No tourniquets in log *  DICTATION: .Dragon Dictation  After informed consent was obtained the patient was brought to the operating room and placed in the supine position on the operating table.  After adequate induction of general anesthesia the patient's abdomen was prepped with ChloraPrep, allowed to dry, and draped in usual sterile manner including the use of an Ioban drape.  An appropriate timeout was performed.  A site was chosen in the right upper quadrant for placement of a 5 mm port.  This area was infiltrated with quarter percent Marcaine.  A small stab incision was made with a 15 blade knife.  A 5 mm Optiview port and camera were used to bluntly dissect through the layers of the abdominal wall under direct vision until access was gained to the abdominal cavity.  The abdomen was then insufflated with carbon dioxide without difficulty.  The camera was placed through the port and the abdomen was inspected.  I was able to identify a very small hernia defect that measured about 2-1/2 cm at the umbilicus.  There were no adhesions to the anterior abdominal wall.  On general inspection of the abdominal cavity there were also no abnormalities noted.  I elected to use a 9 cm round piece of Ovitex LPR.  Next a small incision was made at the inferior edge of the umbilicus through the previous  incision with a 15 blade knife.  The incision was carried through the skin and subcutaneous tissue sharply with the electrocautery until the fascia of the anterior abdominal wall was encountered.  I was able to identify the fascial defect and the hernia sac.  The hernia sac was excised sharply with the electrocautery.  The fascial edges were clean and healthy.  Four #1 Novafil stitches were then placed at equidistant points around the edge of the mesh.  The mesh was oriented with the blue side towards the abdominal wall.  The mesh was then placed through the fascial defect into the abdominal cavity with the appropriate orientation.  The fascial defect was then closed with multiple interrupted #1 Novafil stitches.  The abdomen was then insufflated again.  4 small stab incisions were made at points that corresponded to the anchor stitches around the edge of the mesh.  A suture passer was placed through each of these incisions and used to grab the appropriate tails of the mesh.  All of the tails were then pulled up and the mesh was observed to be in good apposition to the anterior abdominal wall with good orientation.  Each of the stitches was cinched down and tied.  The spaces between the mesh were then filled in with a secure strap absorbable tacker.  Once this was accomplished the mesh was observed to still be in good apposition to the abdominal wall without any gaps or  redundancy.  The area was examined and found to be hemostatic.  At this point the gas was allowed to escape.  The mesh remained in good position.  The umbilicus was then tacked back to the fascia with interrupted 3-0 Vicryl stitch.  The subcutaneous tissue at the infraumbilical incision was closed with interrupted 3-0 Vicryl stitches.  The rest of the incisions were all closed with interrupted 4-0 Monocryl subcuticular stitches.  Dermabond dressings were applied.  The patient tolerated the procedure well.  At the end of the case all needle sponge and  instrument counts were correct.  The patient was then awakened and taken to recovery in stable condition.  PLAN OF CARE: Admit for overnight observation  PATIENT DISPOSITION:  PACU - hemodynamically stable.   Delay start of Pharmacological VTE agent (>24hrs) due to surgical blood loss or risk of bleeding: not applicable
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
## 2023-04-28 NOTE — Plan of Care (Signed)

## 2023-04-28 NOTE — Interval H&P Note (Signed)
History and Physical Interval Note:  04/28/2023 8:11 AM  Anthony [name].  has presented today for surgery, with the diagnosis of VENTRAL HERNIA.  The various methods of treatment have been discussed with the patient and family. After consideration of risks, benefits and other options for treatment, the patient has consented to  Procedure(s): LAPAROSCOPIC VENTRAL HERNIA REPAIR WITH MESH (N/A) as a surgical intervention.  The patient's history has been reviewed, patient examined, no change in status, stable for surgery.  I have reviewed the patient's chart and labs.  Questions were answered to the patient's satisfaction.     [name]

## 2023-04-28 NOTE — Transfer of Care (Signed)
Immediate Anesthesia Transfer of Care Note  Patient: Anthony [name].  Procedure(s) Performed: LAPAROSCOPIC VENTRAL HERNIA REPAIR WITH MESH  Patient Location: PACU  Anesthesia Type:General  Level of Consciousness: awake, alert , and oriented  Airway & Oxygen Therapy: Patient Spontanous Breathing and Patient connected to nasal cannula oxygen  Post-op Assessment: Report given to RN and Post -op Vital signs reviewed and stable  Post vital signs: Reviewed and stable  Last Vitals:  Vitals Value Taken Time  BP 124/101 04/28/23 1100  Temp 36.6 C 04/28/23 1052  Pulse 67 04/28/23 1106  Resp 18 04/28/23 1106  SpO2 94 % 04/28/23 1106  Vitals shown include unfiled device data.  Last Pain:  Vitals:   04/28/23 1052  PainSc: 0-No pain         Complications:  Encounter Notable Events  Notable Event Outcome Phase Comment  Difficult to intubate - expected  Intraprocedure Filed from anesthesia note documentation.

## 2023-04-28 NOTE — Anesthesia Postprocedure Evaluation (Signed)
Anesthesia Post Note  Patient: Anthony [name].  Procedure(s) Performed: LAPAROSCOPIC VENTRAL HERNIA REPAIR WITH MESH     Patient location during evaluation: PACU Anesthesia Type: General Level of consciousness: awake and alert Pain management: pain level controlled Vital Signs Assessment: post-procedure vital signs reviewed and stable Respiratory status: spontaneous breathing, nonlabored ventilation, respiratory function stable and patient connected to nasal cannula oxygen Cardiovascular status: blood pressure returned to baseline and stable Postop Assessment: no apparent nausea or vomiting Anesthetic complications: yes   Encounter Notable Events  Notable Event Outcome Phase Comment  Difficult to intubate - expected  Intraprocedure Filed from anesthesia note documentation.    Last Vitals:  Vitals:   04/28/23 1534 04/28/23 2044  BP: 134/74 (!) 141/68  Pulse: 74 81  Resp: 16 18  Temp: 36.5 C (!) 36.4 C  SpO2: 93% 94%    Last Pain:  Vitals:   04/28/23 2044  TempSrc: Oral  PainSc:                  Collene Schlichter

## 2023-04-28 NOTE — Anesthesia Procedure Notes (Signed)
Procedure Name: Intubation Date/Time: 04/28/2023 9:21 AM  Performed by: Collene Schlichter, MDPre-anesthesia Checklist: Patient identified, Emergency Drugs available, Suction available and Patient being monitored Patient Re-evaluated:Patient Re-evaluated prior to induction Oxygen Delivery Method: Circle System Utilized Preoxygenation: Pre-oxygenation with 100% oxygen Induction Type: IV induction Ventilation: Mask ventilation without difficulty Laryngoscope Size: Glidescope and 4 Grade View: Grade I Tube type: Oral Tube size: 7.5 mm Number of attempts: 1 Airway Equipment and Method: Stylet and Oral airway Placement Confirmation: ETT inserted through vocal cords under direct vision, positive ETCO2 and breath sounds checked- equal and bilateral Secured at: 23 cm Tube secured with: Tape Dental Injury: Teeth and Oropharynx as per pre-operative assessment  Difficulty Due To: Difficulty was anticipated and Difficult Airway- due to reduced neck mobility

## 2023-04-29 DIAGNOSIS — K439 Ventral hernia without obstruction or gangrene: Secondary | ICD-10-CM | POA: Diagnosis not present

## 2023-04-29 MED ORDER — TRAMADOL HCL 50 MG PO TABS: 50.0000 mg | ORAL_TABLET | Freq: Four times a day (QID) | ORAL | 0 refills | Status: DC | PRN

## 2023-04-29 NOTE — Plan of Care (Signed)

## 2023-04-29 NOTE — Progress Notes (Signed)
1 Day Post-Op   Subjective/Chief Complaint: No complaints   Objective: Vital signs in last 24 hours: Temp:  [97.5 F (36.4 C)-98.1 F (36.7 C)] 97.6 F (36.4 C) (09/07 0413) Pulse Rate:  [64-81] 71 (09/07 0413) Resp:  [13-24] 18 (09/06 2044) BP: (88-150)/(64-101) 131/72 (09/07 0413) SpO2:  [67 %-98 %] 94 % (09/07 0413) Weight:  [131.5 kg] 131.5 kg (09/06 0822) Last BM Date : 04/27/23  Intake/Output from previous day: 09/06 0701 - 09/07 0700 In: 900 [I.V.:800; IV Piggyback:100] Out: 10 [Blood:10] Intake/Output this shift: No intake/output data recorded.  General appearance: alert and cooperative Resp: clear to auscultation bilaterally Cardio: regular rate and rhythm GI: soft, mild tenderness. Incisions look good  Lab Results:  Recent Labs    04/27/23 0839  WBC 8.3  HGB 15.0  HCT 44.5  PLT 218   BMET Recent Labs    04/27/23 0839  NA 142  K 3.8  CL 105  CO2 26  GLUCOSE 100*  BUN 18  CREATININE 1.07  CALCIUM 9.2   PT/INR No results for input(s): "LABPROT", "INR" in the last 72 hours. ABG No results for input(s): "PHART", "HCO3" in the last 72 hours.  Invalid input(s): "PCO2", "PO2"  Studies/Results: No results found.  Anti-infectives: Anti-infectives (From admission, onward)    Start     Dose/Rate Route Frequency Ordered Stop   04/28/23 0803  ceFAZolin (ANCEF) 3-0.9 GM/100ML-% IVPB       Note to Pharmacy: Lacie Draft A: cabinet override      04/28/23 0803 04/28/23 0933   04/28/23 0800  ceFAZolin (ANCEF) IVPB 3g/100 mL premix        3 g 200 mL/hr over 30 Minutes Intravenous On call to O.R. 04/28/23 0758 04/28/23 0955       Assessment/Plan: s/p Procedure(s): LAPAROSCOPIC VENTRAL HERNIA REPAIR WITH MESH (N/A) Advance diet Discharge  LOS: 0 days    [name] 04/29/2023

## 2023-05-01 ENCOUNTER — Encounter (HOSPITAL_COMMUNITY): Payer: Self-pay | Admitting: General Surgery

## 2023-05-01 LAB — SURGICAL PATHOLOGY

## 2023-05-08 ENCOUNTER — Other Ambulatory Visit (HOSPITAL_COMMUNITY): Payer: Self-pay

## 2023-05-08 ENCOUNTER — Telehealth: Payer: Self-pay | Admitting: Pharmacy Technician

## 2023-05-08 NOTE — Telephone Encounter (Signed)
Pharmacy Patient Advocate Encounter   Received notification from CoverMyMeds that prior authorization for nexlizet is required/requested.   Insurance verification completed.   The patient is insured through [employer]/ [employer] .   Per test claim: Refill too soon. PA is not needed at this time. Medication was filled 04/21/23. Next eligible fill date is 05/14/23.

## 2023-05-31 ENCOUNTER — Other Ambulatory Visit (HOSPITAL_COMMUNITY): Payer: Self-pay

## 2023-06-03 ENCOUNTER — Other Ambulatory Visit: Payer: Self-pay | Admitting: Internal Medicine

## 2023-06-03 ENCOUNTER — Other Ambulatory Visit: Payer: Self-pay | Admitting: Cardiovascular Disease

## 2023-06-03 DIAGNOSIS — I10 Essential (primary) hypertension: Secondary | ICD-10-CM

## 2023-06-03 DIAGNOSIS — E785 Hyperlipidemia, unspecified: Secondary | ICD-10-CM

## 2023-07-06 ENCOUNTER — Other Ambulatory Visit (HOSPITAL_COMMUNITY): Payer: Self-pay

## 2023-07-24 ENCOUNTER — Ambulatory Visit: Payer: PPO | Attending: Cardiovascular Disease | Admitting: Cardiovascular Disease

## 2023-07-24 ENCOUNTER — Encounter: Payer: Self-pay | Admitting: Cardiovascular Disease

## 2023-07-24 DIAGNOSIS — Z789 Other specified health status: Secondary | ICD-10-CM

## 2023-07-24 DIAGNOSIS — R600 Localized edema: Secondary | ICD-10-CM

## 2023-07-24 DIAGNOSIS — I5189 Other ill-defined heart diseases: Secondary | ICD-10-CM

## 2023-07-24 DIAGNOSIS — I10 Essential (primary) hypertension: Secondary | ICD-10-CM

## 2023-07-24 DIAGNOSIS — E785 Hyperlipidemia, unspecified: Secondary | ICD-10-CM | POA: Diagnosis not present

## 2023-07-24 DIAGNOSIS — I358 Other nonrheumatic aortic valve disorders: Secondary | ICD-10-CM

## 2023-07-24 NOTE — Patient Instructions (Addendum)
Medication Instructions:  Your physician has recommended you make the following change in your medication:  -Continue furosemide (lasix) 40mg  daily. You can take additional 20mg  as needed.   *If you need a refill on your cardiac medications before your next appointment, please call your pharmacy*   Follow-Up: At [facility], you and your health needs are our priority.  As part of our continuing mission to provide you with exceptional heart care, we have created designated Provider Care Teams.  These Care Teams include your primary Cardiologist (physician) and Advanced Practice Providers (APPs -  Physician Assistants and Nurse Practitioners) who all work together to provide you with the care you need, when you need it.  We recommend signing up for the patient portal called "MyChart".  Sign up information is provided on this After Visit Summary.  MyChart is used to connect with patients for Virtual Visits (Telemedicine).  Patients are able to view lab/test results, encounter notes, upcoming appointments, etc.  Non-urgent messages can be sent to your provider as well.   To learn more about what you can do with MyChart, go to [email or web address].    Your next appointment:   12 month(s)  Provider:   Dr. Epifanio Lesches     Other Instructions Dr. Tresa Endo recommends compression stocking 20-7mmHg

## 2023-07-24 NOTE — Progress Notes (Signed)
Patient ID: Anthony [name]., male   DOB: July 30, 1951, 72 y.o.   MRN: [id number]       HPI: Anthony [name]. is a 72 y.o. male who presents to the office for a 4 month follow-up cardiology evaluation.   Anthony [name] has a history of hypertension, mild obesity, as well as hyperlipidemia. In the past, he developed CPK elevation secondary to statin therapy. He has been able to tolerate Zetia. A nuclear perfusion study done in October 2013 for chest pain showed normal perfusion without scar or ischemia.  Laboratory in October 2014 showed cholesterol 196, triglycerides significantly elevated at 291, HDL of 34, VLDL increased at 58, and  LDL 106. Renal function was normal. BUN 15 kerning 0.79.   He developed recurrent episodes of chest pain for which he saw Anthony [name] and because of exertionally precipitated episodes of discomfort definitive cardiac catheterization was recommended. This was done by me on 09/05/2013 and revealed normal coronary arteries with normal LV function without focal segmental wall motion abnormalities. Ejection fraction was 55%.  He underwent [name] on 09/04/2013 which showed [name] ejection fraction of 55-60%. He did have grade 1 diastolic dysfunction and mild left ventricular hypertrophy. He had mild mitral annular calcification with mildly thickened leaflets and trivial mitral regurgitation. A moderate pressure estimate was 27 mm.  He was seen by Anthony [name] on 08/17/2016 with complaints of chest discomfort.  He's episodes would occur often when he was walking on his driveway her in the grocery store and had been ongoing for 1-2 months.  He had diffuse tenderness to palpation along his left pectoral region.  On exam it was felt that some of his chest pain was musculoskeletal in etiology.  He was hypertensive and she further titrated amlodipine.  Because of exertional dyspnea.  She referred him for [name] which was done on 08/23/2016.  This showed mild  LVH with normal systolic function and grade 1 diastolic dysfunction.  There is very mild increased peak Anthony [name] pressure 34 mm.  I saw him in January 2019 after not having seen him since February 2015.  At that time, his pressure was elevated despite taking amlodipine 7.5 mg.  I recommended further titration to 10 mg daily.  On this increased dose, he states his blood pressure at home typically has been running in the 130-135 range.  He denies chest pain PND orthopnea.  He has a history of GERD which is controlled with Nexium.  He has had some issues with left arm paresthesias and was diagnosed with significant cervical degenerative disc disease.  He is scheduled to undergo neck surgery on December 12, 2017 by Anthony [name].  Preoperative surgical clearance was recommended.  He denies any chest pain or shortness of breath.  He has not been exercising as he had in the past in the winter months but hopes to do so following his surgery and with improved weather.    When I saw him in April 2019 he was given surgical clearance for his cervical surgery.  He underwent successful surgery by Anthony [name] involving C4-5 and C5-6 and tolerated this well.   I saw him in January 2020 at that time he had begun to notice some episodes of chest wall discomfort.  He denied any clear-cut exertional precipitation and noted the chest discomfort intermittently.  He was unable to walk well secondary to bilateral knee and hip replacement surgery.  He  also had experience of mild exertional shortness of breath.  I felt most likely that his chest pain was of musculoskeletal etiology.  However with his exertional dyspnea I recommended reevaluation of his coronary anatomy and suggested CT coronary angiography and also a 2-year follow-up echo Doppler assessment.  His echo study was done on August 30, 2018 and showed [name] of 60 to 65% without wall motion abnormalities.  There was grade 2 diastolic dysfunction.  There  was mild increased Anthony [name] pressure at 35 mm.  There was mild MR.  Coronary CTA yielded calcium score of 26.4 which was 20th percentile for age and sex matched control.  He had normal coronary origin with right dominance.  There was minimal plaque in the ostial LAD and OM1.  CT demonstrated atherosclerotic disease in the descending thoracic aorta.  I saw him on March 04, 2019 at which time he denied any chest pain, palpitations or dyspnea.  He was experiencing bilateral leg swelling despite taking HCTZ.   During this COVID pandemic, he has gained approximately 15 pounds.  He has not been exercising due to hip discomfort.  Laboratory in March 2020 showed a total cholesterol 173, triglycerides 143 HDL 46 and LDL 103.  During that evaluation I recommended he discontinue hydrochlorothiazide and in its place start furosemide 40 mg for 2 days and then 20 mg daily.  I saw him in follow-up on September 03, 2019.  Prior to that evaluation he was  on amlodipine 10 mg, benazepril 40 mg, furosemide 20 mg daily.  At times he does experience shortness of breath and notes swelling right leg greater than left.  He has been able to take Zetia 10 mg in addition to very low-dose rosuvastatin 5 mg once a week and is tolerating this well.  Recently he has noticed slight increase in the shortness of breath.  He admits to further weight gain.  During that evaluation with his shortness of breath spironolactone 12.5 mg was added to his medical regimen both from previous improved blood pressure control as well as potential improvement in diastolic dysfunction contributing to his exertional dyspnea.  Laboratory 1 month ago showed total cholesterol 177, HDL 40 triglycerides 182 LDL 107.  BUN 13 creatinine 0.96.  I saw him in follow-up on October 07, 2019.  Repeat chemistry profile showed a potassium of 4.5.  BUN 13 and creatinine 1.06.  Over the prior month, he has had purposeful weight loss contributed by reduction in his meal servings.  He  did receive his Covid vaccination.  He did experience some nonexertional shoulder chest soreness following the vaccination.  He denies any exertional chest pain symptomatology.  During his most recent evaluation blood pressure was improved but was still 138/78 and I recommended slight additional titration of spironolactone to 25 mg daily.  I saw him in June 2021 at that time he was experiencing mild chest tightness for 3 to 4 weeks.   The symptoms are not always exertional.  He admits to decreased energy.  He has been having difficulty with reflux.  He also experiences some sharp pain discomfort going to his back which is nonexertional.  He has been on amlodipine 10 mg, benazepril 40 mg, furosemide 20 mg daily and spironolactone 25 mg daily for his blood pressure and shortness of breath.  He was no longer taking low-dose Crestor and has continued to be on Zetia.  Lipid studies on December 02, 2019 showed [name] LDL cholesterol that had risen to 107.  Total cholesterol  was 173 triglycerides 139.  He sees Anthony [name] who may want to do esophageal dilatation in the future.  He admitted to fatigability, no energy, nocturia at least 3-4 times per night as well as the need to take a nap every day.  During that evaluation, since he had stopped statin therapy I recommended neclizet at 180/10 which is combination bempedoic acid plus Zetia in 1 pill.  We also discussed possible sleep apnea and the importance of weight loss.  When I saw him in September 2021 he told me he could not tolerate bempedoic acid due to development of abdominal cramps and for this reason has only been on Zetia.  He continues to have GERD but is not having any anginal symptoms.  He has difficulty walking secondary to his bilateral knee replacements and hip surgery.  He continues to experience daytime sleepiness, nocturia, and admits to snoring.   I evaluated him on September 28, 2020.  He never pursued a sleep evaluation.  At that time he felt he was  sleeping well sleeping well and typically sleeps between 8 and 9 hours per night.  He denied any exertional anginal symptomatology but at times notes a very sharp twinge anteriorly.  He continues to have issues with his neck and will be undergoing [name] ablation procedure later this week.  He has not been exercising much due to arthritic issues and he is status post bilateral knee and hip replacement surgeries.  He continues to be on amlodipine 10 mg, benazepril 40 mg, furosemide 20 mg, and spironolactone 25 mg daily.  He continues to be on ezetimibe and is tolerating this well.  He had laboratory by his primary physician in on August 10, 2020 which showed total cholesterol 190, HDL 44, LDL 113, triglycerides 214.   I saw him on August 02, 2021 and since my prior evaluation evaluations with Anthony Fabian, Anthony [name] and Anthony Pimple, Anthony [name].  Presently, he denies any anginal type chest pain or shortness of breath.  He has a remote history of bilateral knee and hip replacements and as result does not walk much.  He continues to be on amlodipine 10 mg, benazepril 40 mg, carvedilol 6.25 mg twice a day, furosemide 40 mg and spironolactone 25 mg daily.  He continues to be on Zetia and over-the-counter fish oil for hyperlipidemia.  At times he has taken extra Lasix if there was increasing edema.  He has not been successful with weight loss.  He was seen by Anthony Fabian, Anthony [name] on February 08, 2022.  He had experienced shortness of breath with walking distances over 100 feet.  He had undergone lipid studies on May 31, 2022 which showed total cholesterol 167, HDL 46, triglycerides 147 and LDL 97.  I saw him on August 05, 2022.  Presently, he denies any chest pain.  He still notes occasional shortness of breath which has not progressed.  He is in need to undergo orthopedic surgery with right index knuckle replacement by Dr. Janee Morn at [facility].  He presents today for preoperative cardiology clearance and is  scheduled to see Dr. Sherene Sires later for pulmonary preoperative assessment.  He continues to be on amlodipine 10 mg, benazepril 40 mg, carvedilol 6.25 mg twice daily and spironolactone 25 mg daily.  He is on Zetia for hyperlipidemia 10 mg he takes furosemide 40 mg daily and occasionally takes [name] extra 20 mg depending upon fluid or weight gain.  He is on Nexium for GERD.  He was given clearance to undergo  his surgery and was advised to hold aspirin for the procedure.  With his mild coronary calcification and LDL at 97 I suggested the addition of bempedoic acid to his Zetia.  I also recommended follow-up laboratory.  I saw him on November 11, 2022.  He denied any chest pain but admitted to fatigue and no energy.   He does experience some shortness of breath with activity.  He has had prior bilateral knee and hip surgeries.  Prior to this office visit, he had undergone laboratory on November 08, 2022.  LP(a) was normal at 29.4, total cholesterol 127, triglycerides 171, HDL 38, and LDL 60.  Chemistries 3 revealed slight increase in sodium at 146, total protein was 5.6.  Creatinine 0.98.  Glucose 94.  Previously he had never pursued a sleep evaluation.  He typically goes to bed at 10 PM and wakes up at 7.  He is aware that he snores.  He also experiences nocturia at least 3 days/week.  He often is ready to take a nap at lunchtime due to daytime sleepiness.  He admits to some trace edema to his legs.  During that evaluation, ECG showed sinus rhythm at 71.  With his continued fatigability, morbid obesity, and snoring history I recommended he undergo a split-night sleep study for evaluation of obstructive sleep apnea.  When I last saw him on March 15, 2023, Anthony [name] denied any chest pain.  He admits to mild shortness of breath with activity.  He denies any chest tightness.  He never underwent evaluation for sleep apnea.  He has [name] umbilical hernia.  He continues to be on amlodipine 10 mg, carvedilol 6.25 mg twice a day, furosemide  40 mg daily, olmesartan 40 mg for hypertension.  He is no longer on spironolactone.  He takes Nexium 40 mg daily for GERD.  [name] Epworth Sleepiness Scale score was calculated in the office today and this endorsed that 11 suggestive of excessive daytime sleepiness.    Since I last saw him, he underwent laparoscopic assisted ventral hernia repair with mesh insertion by Anthony [name] on 7 timbre 01/2023.  Tolerated surgery well.  He had undergone a preoperative echo Doppler study on April 06, 2023 which showed EF at 65 to 70%.  There was mild eccentric LVH.  There was aortic sclerosis without stenosis.  Presently, Anthony [name] feels well.  He sees Dr. Roosevelt Locks primary care at [facility] ([place]).  He admits to weight gain.  He also has started to notice some ankle swelling.  He admits that he has been drinking sweet tea frequently.  For blood pressure medications he is on a multidrug regimen including amlodipine 10 mg, carvedilol 6.25 mg twice a day, furosemide 40 mg daily, olmesartan 40 mg and spironolactone 25 mg daily.  He is on Nexium for GERD.  He takes baby aspirin.  He presents for reevaluation.  Past Medical History:  Diagnosis Date   Benign neoplasm of colon    Benign neoplasm of skin of upper limb, including shoulder    Cancer of skin of external cheek    Carpal tunnel syndrome    Chest pain    a. normal cors by cath in 2015   Chronic diastolic HF (heart failure) (HCC)    Diaphragmatic hernia without mention of obstruction or gangrene    Dyslipidemia    Elevated prostate specific antigen (PSA)    GERD (gastroesophageal reflux disease)    Herpes zoster without mention of complication    History  of cancer of external ear    History of hiatal hernia    Hypertension    Hypertrophy of prostate with urinary obstruction and other lower urinary tract symptoms (LUTS)    Impotence of organic origin    Lumbago    Obesity, unspecified    Osteoarthrosis, unspecified whether generalized or  localized, lower leg    Other and unspecified hyperlipidemia    Other seborrheic keratosis    Pneumonia 2004   Stenosis, cervical spine    Unspecified tinnitus     Past Surgical History:  Procedure Laterality Date   ANTERIOR CERVICAL DECOMP/DISCECTOMY FUSION N/A 02/19/2018   Procedure: ANTERIOR CERVICAL DECOMPRESSION/DISCECTOMY FUSION - CERVICAL FOUR-CERVICAL FIVE - CERVICAL FIVE-CERVICAL SIX;  Surgeon: Julio Sicks, MD;  Location: [facility] OR;  Service: Neurosurgery;  Laterality: N/A;   CARDIOVASCULAR STRESS TEST  06/15/2012   Non-diagnostic for ischemia. No lexiscan EKG changes.   CHOLECYSTECTOMY  08/22/1990   COLONOSCOPY  2022   EYE SURGERY     eyeband  08/22/2004   removal of right eyeband   HAND SURGERY  07/2022   right pointer knuckle replaced   INGUINAL HERNIA REPAIR  08/22/1982   left   INTRAOCULAR LENS REMOVAL  08/22/2006   right   LEFT HEART CATHETERIZATION WITH CORONARY ANGIOGRAM N/A 09/05/2013   Procedure: LEFT HEART CATHETERIZATION WITH CORONARY ANGIOGRAM;  Surgeon: Lennette Bihari, MD;  Location: [facility] CATH LAB;  Service: Cardiovascular;  Laterality: N/A;   LOWER VENOUS EXTREMITY DOPPLER  01/26/2011   No evidence of thrombus or thrombophlebitis.   RETINAL DETACHMENT SURGERY  08/23/1999   right   TOTAL HIP ARTHROPLASTY  08/22/2010   bilateral   TOTAL KNEE ARTHROPLASTY  2011, 2012   bilateral   TRANSTHORACIC ECHOCARDIOGRAM  10/05/2009   EF >55%, normal LV size, systolic, and diastolic function.   VENTRAL HERNIA REPAIR N/A 04/28/2023   Procedure: LAPAROSCOPIC VENTRAL HERNIA REPAIR WITH MESH;  Surgeon: Anthony Miner, MD;  Location: [facility] OR;  Service: General;  Laterality: N/A;    Allergies  Allergen Reactions   Lipitor [Atorvastatin] Other (See Comments)    Leg pain/cramps; statin myopathy   Red Yeast Rice [Cholestin] Other (See Comments)    Makes legs hurt, myopathy   Statins Other (See Comments)    "muscle break down", statin myopathy   Statins Support [Acid Blockers  Support] Other (See Comments)    Leg pain   Codeine Other (See Comments)    Headache   Latex Rash    Current Outpatient Medications  Medication Sig Dispense Refill   amLODipine (NORVASC) 10 MG tablet TAKE 1 TABLET BY MOUTH EVERY DAY IN THE MORNING 90 tablet 3   aspirin EC 81 MG tablet Take 81 mg by mouth daily. Swallow whole.     brimonidine (ALPHAGAN P) 0.1 % SOLN Place 1 drop into the right eye every 8 (eight) hours.     carvedilol (COREG) 6.25 MG tablet TAKE 1 TABLET BY MOUTH TWICE A DAY 180 tablet 3   dorzolamide-timolol (COSOPT) 22.3-6.8 MG/ML ophthalmic solution Place 2 drops into the right eye 2 (two) times daily.      esomeprazole (NEXIUM) 40 MG capsule Take 1 capsule (40 mg total) by mouth daily at 12 noon. 180 capsule 3   finasteride (PROSCAR) 5 MG tablet TAKE 1 TABLET BY MOUTH EVERY DAY IN THE MORNING 90 tablet 3   furosemide (LASIX) 40 MG tablet TAKE 1 DAILY. TAKE [name] ADDITIONAL 20 MG FOR WEIGHT AGAIN. (3 POUNDS OVERNIGHT OR 5 POUNDS  IN 1 WEEK.) 105 tablet 3   Lactobacillus-Inulin (CULTURELLE ADULT ULT BALANCE PO) Take 1 capsule by mouth daily.     olmesartan (BENICAR) 40 MG tablet Take 1 tablet (40 mg total) by mouth daily. 90 tablet 3   Omega-3 Fatty Acids (FISH OIL) 1000 MG CAPS Take 1,000 mg by mouth daily.     pantoprazole (PROTONIX) 40 MG tablet Take 40 mg by mouth daily.     Polyvinyl Alcohol-Povidone (REFRESH OP) Place 1 drop into the right eye daily as needed (dry eyes/irritation).     spironolactone (ALDACTONE) 25 MG tablet TAKE 1 TABLET (25 MG TOTAL) BY MOUTH DAILY. 90 tablet 1   No current facility-administered medications for this visit.    Social History   Socioeconomic History   Marital status: Married    Spouse name: Not on file   Number of children: 2   Years of education: Not on file   Highest education level: 12th grade  Occupational History   Occupation: Cabin crew --retired    Associate Professor: [employer]    Comment: And Dispensing optician  Tobacco Use   Smoking  status: Never    Passive exposure: Past   Smokeless tobacco: Never  Vaping Use   Vaping status: Never Used  Substance and Sexual Activity   Alcohol use: No   Drug use: No   Sexual activity: Yes    Partners: Female    Comment: wife  Other Topics Concern   Not on file  Social History Narrative   Daughter and son      Living Will   Wife is health care POA--daughter is alternate   Would accept resuscitation attempts--but no prolonged ventilation or tube feeds   Social Determinants of Health   Financial Resource Strain: Low Risk  (02/28/2023)   Overall Financial Resource Strain (CARDIA)    Difficulty of Paying Living Expenses: Not hard at all  Food Insecurity: No Food Insecurity (04/28/2023)   Hunger Vital Sign    Worried About Running Out of Food in the Last Year: Never true    Ran Out of Food in the Last Year: Never true  Transportation Needs: No Transportation Needs (04/28/2023)   PRAPARE - Administrator, Civil Service (Medical): No    Lack of Transportation (Non-Medical): No  Physical Activity: Insufficiently Active (02/28/2023)   Exercise Vital Sign    Days of Exercise per Week: 1 day    Minutes of Exercise per Session: 20 min  Stress: No Stress Concern Present (02/28/2023)   Anthony [name] of Occupational Health - Occupational Stress Questionnaire    Feeling of Stress : Only a little  Social Connections: Moderately Integrated (02/28/2023)   Social Connection and Isolation Panel [NHANES]    Frequency of Communication with Friends and Family: More than three times a week    Frequency of Social Gatherings with Friends and Family: Three times a week    Attends Religious Services: Never    Active Member of Clubs or Organizations: Yes    Attends Banker Meetings: More than 4 times per year    Marital Status: Married  Catering manager Violence: Not At Risk (04/28/2023)   Humiliation, Afraid, Rape, and Kick questionnaire    Fear of Current or Ex-Partner: No     Emotionally Abused: No    Physically Abused: No    Sexually Abused: No   Social is notable that he is married has 2 children and 2 grandchildren. He is not routinely exercise. There is  no tobacco use. He does drink occasional alcohol.  Family History  Problem Relation Age of Onset   Diabetes Mother    Arrhythmia Mother    Hypertension Mother    Dementia Father    Pulmonary embolism Father    Hypertension Father    Cancer Sister        Liver cancer - Histocytic lymphoma   Cancer Sister 64       Skin cancer   Hypertension Sister 23   Diabetes Sister    Stroke Brother    Diabetes Brother    Hypertension Brother    Cancer Daughter    Cancer Maternal Grandmother        Skin cancer   Heart attack Maternal Grandfather    Cancer Paternal Grandfather    Hypertension Paternal Grandfather     ROS General: Negative; No fevers, chills, or night sweats;  positive for a 16 pound weight loss in September 2018.  Positive for fatigue HEENT: Negative; No changes in vision or hearing, sinus congestion, difficulty swallowing Pulmonary: Negative; No cough, wheezing, shortness of breath, hemoptysis Cardiovascular: Positive for occasional exertional shortness of breath.  No exertional chest pain.  Positive for right leg greater than left swelling GI: Positive for GERD  GU: Positive for nocturia; BPH Musculoskeletal: Lateral hip and knee discomfort; status post recent surgery C4-5/C5-6 Hematologic/Oncology: Negative; no easy bruising, bleeding Endocrine: Negative; no heat/cold intolerance; no diabetes Neuro: Mild left arm paresthesias from his cervical degenerative disease Skin: Negative; No rashes or skin lesions Psychiatric: Negative; No behavioral problems, depression Sleep: Positive for snoring, daytime fatigue, snoring, daytime sleepiness; no bruxism, restless legs, hypnogognic hallucinations, no cataplexy  [name] Epworth Sleepiness Scale was calculated in the office 03/15/2023 and this  endorsed at 11 as shown below consistent with excessive daytime sleepiness.  Epworth Sleepiness Scale: Situation   Chance of Dozing/Sleeping (0 = never , 1 = slight chance , 2 = moderate chance , 3 = high chance )   sitting and reading 2   watching TV 2   sitting inactive in a public place 1   being a passenger in a motor vehicle for [name] hour or more 1   lying down in the afternoon 2   sitting and talking to someone 1   sitting quietly after lunch (no alcohol) 2   while stopped for a few minutes in traffic as the driver 0   Total Score  11     Other comprehensive 14 point system review is negative.  PE BP 126/80   Pulse 68   Ht 5\' 10"  (1.778 m)   Wt (!) 306 lb (138.8 kg)   SpO2 93%   BMI 43.91 kg/m    Repeat blood pressure by me was 130/78  Wt Readings from Last 3 Encounters:  07/24/23 (!) 306 lb (138.8 kg)  04/28/23 290 lb (131.5 kg)  04/27/23 291 lb 12.8 oz (132.4 kg)   16 pound weight gain since my July 2020 for evaluation  General: Alert, oriented, no distress.  Skin: normal turgor, no rashes, warm and dry HEENT: Normocephalic, atraumatic. Pupils equal round and reactive to light; sclera anicteric; extraocular muscles intact;  Nose without nasal septal hypertrophy Mouth/Parynx benign; Mallinpatti scale 3/4 Neck: No JVD, no carotid bruits; normal carotid upstroke Lungs: clear to ausculatation and percussion; no wheezing or rales Chest wall: without tenderness to palpitation Heart: PMI not displaced, RRR, s1 s2 normal, 1/6 systolic murmur, no diastolic murmur, no rubs, gallops, thrills, or heaves Abdomen: Central  adiposity; soft, nontender; no hepatosplenomehaly, BS+; abdominal aorta nontender and not dilated by palpation. Back: no CVA tenderness Pulses 2+ Musculoskeletal: full range of motion, normal strength, no joint deformities Extremities: Trace bilateral ankle swelling no clubbing cyanosis or edema, Homan's sign negative  Neurologic: grossly nonfocal; Cranial  nerves grossly wnl Psychologic: Normal mood and affect    EKG Interpretation Date/Time:  Monday July 24 2023 09:11:41 EST Ventricular Rate:  68 PR Interval:  180 QRS Duration:  82 QT Interval:  420 QTC Calculation: 446 R Axis:   29  Text Interpretation: Sinus rhythm with occasional Premature ventricular complexes When compared with ECG of 15-Mar-2023 08:33, Premature ventricular complexes are now Present Confirmed by Anthony [name] ([id number]) on 07/24/2023 9:53:41 AM     August 05, 2022 ECG (independently read by me): NSR at 71, no ecotpy, no ST changes  August 02, 2021 ECG (independently read by me): Normal sinus rhythm at 74 bpm.  No ectopy.  Normal intervals.  September 2021 ECG (independently read by me): Normal sinus rhythm at 82 bpm.  No significant ST-T change.  Normal intervals.  No ectopy  June 2021 ECG (independently read by me): NSR at 73;  No ectopy, no ST changes, normal intervals  January 12,2021 ECG (independently read by me): Normal sinus rhythm at 81 bpm.  No ectopy.  Normal intervals.  July 2020 ECG (independently read by me): NSR at 70  January 2020 ECG (independently read by me): Normal sinus rhythm at 74 bpm.  No ectopy.  No ST segment changes.  April 2019 ECG (independently read by me): Normal sinus rhythm at 66 bpm.  Normal intervals.  No ectopy.  No ST segment changes.  August 28, 2017 ECG (independently read by me): Normal sinus rhythm at 63 bpm.  Normal intervals.  No ST segment changes.  February 2015 ECG (independently read by me): Normal sinus rhythm at 66 beats per minute. PR interval 160 ms. QTC intervals 34 ms. No significant ST-T changes.  November 2014 ECG (independently read by me)::Normal sinus rhythm at 69 beats per minute. No ectopy. Normal intervals.  LABS:     Latest Ref Rng & Units 04/27/2023    8:39 AM 11/08/2022   12:14 PM 06/02/2022    2:09 PM  BMP  Glucose 70 - 99 mg/dL 191  94  86   BUN 8 - 23 mg/dL 18  16  14    Creatinine  0.61 - 1.24 mg/dL 4.78  2.95  6.21   BUN/Creat Ratio 10 - 24  16  SEE NOTE:   Sodium 135 - 145 mmol/L 142  146  140   Potassium 3.5 - 5.1 mmol/L 3.8  4.4  3.9   Chloride 98 - 111 mmol/L 105  108  106   CO2 22 - 32 mmol/L 26  23  27    Calcium 8.9 - 10.3 mg/dL 9.2  9.3  8.9       Latest Ref Rng & Units 04/27/2023    8:39 AM 06/02/2022    2:09 PM 03/01/2021    9:00 AM  CBC  WBC 4.0 - 10.5 K/uL 8.3  9.7  7.9   Hemoglobin 13.0 - 17.0 g/dL 30.8  65.7  84.6   Hematocrit 39.0 - 52.0 % 44.5  44.0  47.7   Platelets 150 - 400 K/uL 218  205  204    Lab Results  Component Value Date   TSH 3.27 11/25/2019    Lab Results  Component Value Date  HGBA1C 5.4 03/01/2021      Latest Ref Rng & Units 11/08/2022   12:14 PM 09/01/2021    9:49 AM 12/02/2019    8:44 AM  Hepatic Function  Total Protein 6.0 - 8.5 g/dL 5.6  5.7  5.5   Albumin 3.8 - 4.8 g/dL 4.3   4.1   AST 0 - 40 IU/L 24  18  17    ALT 0 - 44 IU/L 25  21  19    Alk Phosphatase 44 - 121 IU/L 82   98   Total Bilirubin 0.0 - 1.2 mg/dL 0.4  0.5  0.3    Lipid Panel     Component Value Date/Time   CHOL 127 11/08/2022 1214   CHOL 191 07/22/2013 0835   TRIG 171 (H) 11/08/2022 1214   TRIG 253 (H) 07/22/2013 0835   HDL 38 (L) 11/08/2022 1214   HDL 38 (L) 07/22/2013 0835   CHOLHDL 3.3 11/08/2022 1214   CHOLHDL 3.6 03/30/2022 0858   VLDL 35 (H) 10/27/2016 0825   LDLCALC 60 11/08/2022 1214   LDLCALC 97 03/30/2022 0858   LDLCALC 102 (H) 07/22/2013 0835    RADIOLOGY: No results found.  IMPRESSION:  1. Essential hypertension   2. Aortic valve sclerosis   3. Hyperlipidemia with target LDL less than 70   4. Grade II diastolic dysfunction   5. Bilateral lower extremity edema   6. Morbid obesity (HCC)   7. Statin intolerance      ASSESSMENT AND PLAN: Mr. Kawada is a 72 year-old gentleman who has a history of morbid obesity, hyperlipidemia, hypertension, and GERD.  He developed chest discomfort in 2015 which ultimately led to definitive  cardiac catheterization which revealed normal coronary arteries.  He  underwent successful surgery by Anthony [name] and tolerated this well from a cardiac standpoint.  He had developed some episodes of chest pain which most likely were of musculoskeletal etiology.  A CTA in 2020 showed minimal coronary plaque with a calcium score of 26.4  (20th percentile) involving his LAD ostium and OM1 vessel.  He also was noted to have atherosclerosis of his thoracic aorta.  His last echo Doppler study on June 11, 2021 showed normal LV function with EF 60 to 65% with normal wall motion and mild grade 1 diastolic dysfunction.  He had mildly elevated systolic pressure.  There was mild aortic valve sclerosis without stenosis.  There was mild aortic dilatation of the ascending aorta at 41 mm and aortic arch at 36 mm. He underwent successful knuckle replacement surgery of the right index finger.  At a prior office visit I had recommended bempedoic acid be added to Zetia.  Laboratory in March 2024 showed [name] LDL cholesterol at 60 with total cholesterol 127 triglycerides 171.  Apparently, he had been off nexlizet  for some time and never reinstituted therapy.  His blood pressure today is stable.  I discussed with him his 16 pound weight gain over the past 4 months.  I strongly recommended he not drink sweet tea and if he wants to drink tea to make certain it is unsweetened.  He does have some mild lower extremity edema despite taking Lasix 40 mg in the morning.  I have recommended that he can take [name] extra 20 mg in the late afternoon on [name] as needed basis depending upon edema.  I also suggested compression stockings 20 to 30 mm to use below the knee.  BMI is consistent with morbid obesity and weight  loss was strongly recommended.  He believes he is sleeping well.  Tolerated his umbilical previous surgery without cardiovascular compromise.  I discussed with him my plans for future retirement in 2025.  I have  suggested he return in 1 year for follow-up evaluation and at that time we will transition him to see Dr. Epifanio Lesches.    Lennette Bihari, MD, [facility]  07/24/2023 11:17 AM

## 2023-08-14 ENCOUNTER — Other Ambulatory Visit (HOSPITAL_COMMUNITY): Payer: Self-pay

## 2023-08-17 ENCOUNTER — Other Ambulatory Visit (HOSPITAL_COMMUNITY): Payer: Self-pay

## 2023-08-30 ENCOUNTER — Other Ambulatory Visit: Payer: Self-pay | Admitting: Internal Medicine

## 2023-08-30 NOTE — Telephone Encounter (Signed)
Pt needs an appointment for further refills  

## 2023-09-14 ENCOUNTER — Encounter: Payer: Self-pay | Admitting: Internal Medicine

## 2023-09-14 ENCOUNTER — Ambulatory Visit: Payer: PPO | Admitting: Internal Medicine

## 2023-09-14 VITALS — BP 108/60 | HR 76 | Temp 98.0°F | Ht 67.5 in | Wt 264.0 lb

## 2023-09-14 DIAGNOSIS — I5032 Chronic diastolic (congestive) heart failure: Secondary | ICD-10-CM | POA: Diagnosis not present

## 2023-09-14 DIAGNOSIS — N401 Enlarged prostate with lower urinary tract symptoms: Secondary | ICD-10-CM | POA: Diagnosis not present

## 2023-09-14 DIAGNOSIS — J0110 Acute frontal sinusitis, unspecified: Secondary | ICD-10-CM | POA: Diagnosis not present

## 2023-09-14 DIAGNOSIS — Z0000 Encounter for general adult medical examination without abnormal findings: Secondary | ICD-10-CM

## 2023-09-14 DIAGNOSIS — K2100 Gastro-esophageal reflux disease with esophagitis, without bleeding: Secondary | ICD-10-CM | POA: Diagnosis not present

## 2023-09-14 MED ORDER — AMOXICILLIN-POT CLAVULANATE 875-125 MG PO TABS: 1.0000 | ORAL_TABLET | Freq: Two times a day (BID) | ORAL | 0 refills | Status: DC

## 2023-09-14 NOTE — Assessment & Plan Note (Signed)
Reasonable voiding on the finasteride 5mg  daily

## 2023-09-14 NOTE — Progress Notes (Signed)
Hearing Screening - Comments:: December 2024. Recommended hearing aids.  Vision Screening - Comments:: December 2024

## 2023-09-14 NOTE — Assessment & Plan Note (Signed)
Seems to have had volume related symptoms--that are mostly controlled with furosemide 40mg  daily Normal systolic function---off the spironolactone Carvedilol 6.25 bid, olmesartan 40mg  daily

## 2023-09-14 NOTE — Assessment & Plan Note (Signed)
I have personally reviewed the Medicare Annual Wellness questionnaire and have noted 1. The patient's medical and social history 2. Their use of alcohol, tobacco or illicit drugs 3. Their current medications and supplements 4. The patient's functional ability including ADL's, fall risks, home safety risks and hearing or visual             impairment. 5. Diet and physical activities 6. Evidence for depression or mood disorders  The patients weight, height, BMI and visual acuity have been recorded in the chart I have made referrals, counseling and provided education to the patient based review of the above and I have provided the pt with a written personalized care plan for preventive services.  I have provided you with a copy of your personalized plan for preventive services. Please take the time to review along with your updated medication list.  Done with cancer screening Really needs to start some exercise Flu and COVID vaccines once illness better Consider shingrix at pharmacy [facility] next year

## 2023-09-14 NOTE — Assessment & Plan Note (Signed)
Sick for a week with ongoing symptoms Will treat with augmentin 875 bid x 7 days analgesics

## 2023-09-14 NOTE — Assessment & Plan Note (Signed)
Controlled now with nexium 40 bid

## 2023-09-14 NOTE — Assessment & Plan Note (Signed)
Has lost considerable weight Discussed transitioning to long time healthy eating Needs to start exercise

## 2023-09-14 NOTE — Progress Notes (Signed)
Subjective:    Patient ID: Anthony Canner., male    DOB: [birth year], 73 y.o.   MRN: [id number]  HPI Here for Medicare wellness visit and follow up of chronic health conditions Reviewed advanced directives Reviewed other doctors--Dr Rudd--ENT, Dr Meroth--audiology, Dr Tamsen Roers, Dr Toth--surgery, Dr Kelly--cardiology, Dr Alfonso Ramus, Dermatologist (name?) Had repair of a ventral hernia in September--only surgery No hospitalizations Not really exercising still Poor hearing---considering hearing aides Vision is okay Rare alcohol No tobacco No falls No depression or anhedonia Independent with instrumental ADLs No memory issues  Has been going to a weight loss clinic--  Weight Loss Calorie reduction only--no meds Has lost 25# since last year Still with BMI of 40 Thinks he doesn't need the same BP meds  Ankle swelling has improved--still takes the furosemide daily Sleeps flat in general---does still have some PND (then will elevated it) Hasn't had sleep study as yet Wife doesn't note apnea No chest pain---just tight with current infection No palpitations No dizziness or syncope  Currently has respiratory infection for a week Feels it in his sinuses---lots of congestion Constant cough--heavy mucus, dark at times Sore throat at first--better now Headache on top No ear pain Some fever--and cold/hot. No sweats Some SOB--even just sitting   Rare heartburn Still on nexium twice a day Rare dysphagia---ENT eval okay  Current Outpatient Medications on File Prior to Visit  Medication Sig Dispense Refill   amLODipine (NORVASC) 10 MG tablet TAKE 1 TABLET BY MOUTH EVERY DAY IN THE MORNING 90 tablet 3   aspirin EC 81 MG tablet Take 81 mg by mouth daily. Swallow whole.     brimonidine (ALPHAGAN P) 0.1 % SOLN Place 1 drop into the right eye every 8 (eight) hours.     carvedilol (COREG) 6.25 MG tablet TAKE 1 TABLET BY MOUTH TWICE A DAY 180 tablet 3    dorzolamide-timolol (COSOPT) 22.3-6.8 MG/ML ophthalmic solution Place 2 drops into the right eye 2 (two) times daily.      esomeprazole (NEXIUM) 40 MG capsule Take 1 capsule (40 mg total) by mouth daily at 12 noon. 180 capsule 3   finasteride (PROSCAR) 5 MG tablet TAKE 1 TABLET BY MOUTH EVERY DAY IN THE MORNING 90 tablet 3   furosemide (LASIX) 40 MG tablet TAKE 1 DAILY. TAKE AN ADDITIONAL 20 MG FOR WEIGHT AGAIN. (3 POUNDS OVERNIGHT OR 5 POUNDS IN 1 WEEK.) 105 tablet 3   HM MAGNESIUM CITRATE PO Take 250 mg by mouth daily at 8 pm.     Lactobacillus-Inulin (CULTURELLE ADULT ULT BALANCE PO) Take 1 capsule by mouth daily.     olmesartan (BENICAR) 40 MG tablet TAKE 1 TABLET BY MOUTH EVERY DAY 90 tablet 0   Omega-3 Fatty Acids (FISH OIL) 1000 MG CAPS Take 1,000 mg by mouth daily.     Polyvinyl Alcohol-Povidone (REFRESH OP) Place 1 drop into the right eye daily as needed (dry eyes/irritation).     No current facility-administered medications on file prior to visit.    Allergies  Allergen Reactions   Lipitor [Atorvastatin] Other (See Comments)    Leg pain/cramps; statin myopathy   Red Yeast Rice [Cholestin] Other (See Comments)    Makes legs hurt, myopathy   Statins Other (See Comments)    "muscle break down", statin myopathy   Statins Support [Acid Blockers Support] Other (See Comments)    Leg pain   Codeine Other (See Comments)    Headache   Latex Rash    Past Medical History:  Diagnosis Date   Benign neoplasm of colon    Benign neoplasm of skin of upper limb, including shoulder    Cancer of skin of external cheek    Carpal tunnel syndrome    Chest pain    a. normal cors by cath in 2015   Chronic diastolic HF (heart failure) (HCC)    Diaphragmatic hernia without mention of obstruction or gangrene    Dyslipidemia    Elevated prostate specific antigen (PSA)    GERD (gastroesophageal reflux disease)    Herpes zoster without mention of complication    History of cancer of external ear     History of hiatal hernia    Hypertension    Hypertrophy of prostate with urinary obstruction and other lower urinary tract symptoms (LUTS)    Impotence of organic origin    Lumbago    Obesity, unspecified    Osteoarthrosis, unspecified whether generalized or localized, lower leg    Other and unspecified hyperlipidemia    Other seborrheic keratosis    Pneumonia 2004   Stenosis, cervical spine    Unspecified tinnitus     Past Surgical History:  Procedure Laterality Date   ANTERIOR CERVICAL DECOMP/DISCECTOMY FUSION N/A 02/19/2018   Procedure: ANTERIOR CERVICAL DECOMPRESSION/DISCECTOMY FUSION - CERVICAL FOUR-CERVICAL FIVE - CERVICAL FIVE-CERVICAL SIX;  Surgeon: Julio Sicks, MD;  Location: [facility] OR;  Service: Neurosurgery;  Laterality: N/A;   CARDIOVASCULAR STRESS TEST  06/15/2012   Non-diagnostic for ischemia. No lexiscan EKG changes.   CHOLECYSTECTOMY  08/22/1990   COLONOSCOPY  2022   EYE SURGERY     eyeband  08/22/2004   removal of right eyeband   HAND SURGERY  07/2022   right pointer knuckle replaced   INGUINAL HERNIA REPAIR  08/22/1982   left   INTRAOCULAR LENS REMOVAL  08/22/2006   right   LEFT HEART CATHETERIZATION WITH CORONARY ANGIOGRAM N/A 09/05/2013   Procedure: LEFT HEART CATHETERIZATION WITH CORONARY ANGIOGRAM;  Surgeon: Lennette Bihari, MD;  Location: [facility] CATH LAB;  Service: Cardiovascular;  Laterality: N/A;   LOWER VENOUS EXTREMITY DOPPLER  01/26/2011   No evidence of thrombus or thrombophlebitis.   RETINAL DETACHMENT SURGERY  08/23/1999   right   TOTAL HIP ARTHROPLASTY  08/22/2010   bilateral   TOTAL KNEE ARTHROPLASTY  2011, 2012   bilateral   TRANSTHORACIC ECHOCARDIOGRAM  10/05/2009   EF >55%, normal LV size, systolic, and diastolic function.   VENTRAL HERNIA REPAIR N/A 04/28/2023   Procedure: LAPAROSCOPIC VENTRAL HERNIA REPAIR WITH MESH;  Surgeon: Griselda Miner, MD;  Location: [facility] OR;  Service: General;  Laterality: N/A;    Family History  Problem Relation Age  of Onset   Diabetes Mother    Arrhythmia Mother    Hypertension Mother    Dementia Father    Pulmonary embolism Father    Hypertension Father    Cancer Sister        Liver cancer - Histocytic lymphoma   Cancer Sister 33       Skin cancer   Hypertension Sister 56   Diabetes Sister    Stroke Brother    Diabetes Brother    Hypertension Brother    Cancer Daughter    Cancer Maternal Grandmother        Skin cancer   Heart attack Maternal Grandfather    Cancer Paternal Grandfather    Hypertension Paternal Grandfather     Social History   Socioeconomic History   Marital status: Married    Spouse name: Not  on file   Number of children: 2   Years of education: Not on file   Highest education level: 12th grade  Occupational History   Occupation: Cabin crew --retired    Associate Professor: [employer]    Comment: And [employer]  Tobacco Use   Smoking status: Never    Passive exposure: Past   Smokeless tobacco: Never  Vaping Use   Vaping status: Never Used  Substance and Sexual Activity   Alcohol use: No   Drug use: No   Sexual activity: Yes    Partners: Female    Comment: wife  Other Topics Concern   Not on file  Social History Narrative   Daughter and son      Living Will   Wife is health care POA--daughter is alternate   Would accept resuscitation attempts--but no prolonged ventilation or tube feeds   Social Drivers of Health   Financial Resource Strain: Low Risk  (02/28/2023)   Overall Financial Resource Strain (CARDIA)    Difficulty of Paying Living Expenses: Not hard at all  Food Insecurity: No Food Insecurity (04/28/2023)   Hunger Vital Sign    Worried About Running Out of Food in the Last Year: Never true    Ran Out of Food in the Last Year: Never true  Transportation Needs: No Transportation Needs (04/28/2023)   PRAPARE - Administrator, Civil Service (Medical): No    Lack of Transportation (Non-Medical): No  Physical Activity: Insufficiently Active  (02/28/2023)   Exercise Vital Sign    Days of Exercise per Week: 1 day    Minutes of Exercise per Session: 20 min  Stress: No Stress Concern Present (02/28/2023)   [employer] of Occupational Health - Occupational Stress Questionnaire    Feeling of Stress : Only a little  Social Connections: Moderately Integrated (02/28/2023)   Social Connection and Isolation Panel [NHANES]    Frequency of Communication with Friends and Family: More than three times a week    Frequency of Social Gatherings with Friends and Family: Three times a week    Attends Religious Services: Never    Active Member of Clubs or Organizations: Yes    Attends Banker Meetings: More than 4 times per year    Marital Status: Married  Catering manager Violence: Not At Risk (04/28/2023)   Humiliation, Afraid, Rape, and Kick questionnaire    Fear of Current or Ex-Partner: No    Emotionally Abused: No    Physically Abused: No    Sexually Abused: No   Review of Systems Appetite is okay Sleeps okay Wears seat  belt Teeth are okay---keeps up with dentist No suspicious skin lesions Bowels got slow with weight loss---taking magnesium regularly Voids okay--stream is slow at times. Some urgency No sig back or joint pains     Objective:   Physical Exam Constitutional:      Appearance: Normal appearance. He is obese.  HENT:     Head:     Comments: No sinus tenderness    Mouth/Throat:     Pharynx: No oropharyngeal exudate or posterior oropharyngeal erythema.  Eyes:     Conjunctiva/sclera: Conjunctivae normal.     Pupils: Pupils are equal, round, and reactive to light.  Cardiovascular:     Rate and Rhythm: Normal rate and regular rhythm.     Pulses: Normal pulses.     Heart sounds: No murmur heard.    No gallop.  Pulmonary:     Effort: Pulmonary effort is  normal.     Breath sounds: Normal breath sounds. No wheezing or rales.  Abdominal:     Palpations: Abdomen is soft.     Tenderness: There is no  abdominal tenderness.  Musculoskeletal:     Cervical back: Neck supple.     Right lower leg: No edema.     Left lower leg: No edema.  Lymphadenopathy:     Cervical: No cervical adenopathy.  Skin:    Findings: No lesion or rash.  Neurological:     General: No focal deficit present.     Mental Status: He is alert and oriented to person, place, and time.     Comments: Mini-cog normal  Psychiatric:        Mood and Affect: Mood normal.        Behavior: Behavior normal.            Assessment & Plan:

## 2023-09-20 ENCOUNTER — Encounter: Payer: Self-pay | Admitting: Internal Medicine

## 2023-09-21 MED ORDER — DOXYCYCLINE HYCLATE 100 MG PO TABS: 100.0000 mg | ORAL_TABLET | Freq: Two times a day (BID) | ORAL | 0 refills | Status: DC

## 2023-09-28 ENCOUNTER — Other Ambulatory Visit (HOSPITAL_COMMUNITY): Payer: Self-pay

## 2023-09-28 ENCOUNTER — Telehealth: Payer: Self-pay | Admitting: Pharmacy Technician

## 2023-09-28 NOTE — Telephone Encounter (Signed)
 Prior auth not needed. Patient not on medication at this time

## 2023-10-18 ENCOUNTER — Other Ambulatory Visit (HOSPITAL_COMMUNITY): Payer: Self-pay

## 2023-10-18 ENCOUNTER — Telehealth: Payer: Self-pay | Admitting: Cardiovascular Disease

## 2023-10-18 DIAGNOSIS — I10 Essential (primary) hypertension: Secondary | ICD-10-CM

## 2023-10-18 DIAGNOSIS — I5032 Chronic diastolic (congestive) heart failure: Secondary | ICD-10-CM

## 2023-10-18 MED ORDER — FUROSEMIDE 20 MG PO TABS: 20.0000 mg | ORAL_TABLET | Freq: Every day | ORAL | 3 refills | Status: DC

## 2023-10-18 MED ORDER — AMLODIPINE BESYLATE 5 MG PO TABS
5.0000 mg | ORAL_TABLET | Freq: Every day | ORAL | 3 refills | Status: DC
Start: 2023-10-18 — End: 2023-10-18
  Filled 2023-10-18: qty 90, 90d supply, fill #0

## 2023-10-18 MED ORDER — FUROSEMIDE 20 MG PO TABS
20.0000 mg | ORAL_TABLET | Freq: Every day | ORAL | 3 refills | Status: DC
Start: 2023-10-18 — End: 2023-10-18
  Filled 2023-10-18: qty 90, 90d supply, fill #0

## 2023-10-18 MED ORDER — AMLODIPINE BESYLATE 5 MG PO TABS
5.0000 mg | ORAL_TABLET | Freq: Every day | ORAL | 3 refills | Status: DC
Start: 2023-10-18 — End: 2023-10-30

## 2023-10-18 NOTE — Telephone Encounter (Signed)
 Spoke with patient and spouse. Pt reports he has been lightheaded with activity and experiencing headaches daily x3 weeks. Sometimes feels like he's going to pass out when changing positions. Recent BPs from the past few days are 114/68, 99/49, 112/64, 121/69. HR running in the 70s. Pt has intentionally lost 48lbs since December.   Confirmed current medications include amlodipine 10mg  daily, carvedilol 6.25mg  BID, furosemide 40mg  daily with extra 20mg  PRN for weight gain (he has not needed this recently), and olmesartan 40mg  daily.  Discussed with Dr. Tresa Endo (DOD). Received verbal order to decrease amlodipine to 5mg  daily and furosemide to 20mg  daily. Follow-up with APP in 2 weeks. New prescriptions sent to pharmacy per pt's request. Pt agrees to continue monitoring BP and symptoms; aware to call back if symptoms do not improve with the above med changes. Scheduled for appointment with Sherlean Foot, NP, on 10/30/23 at 2:45pm. Pt and wife express appreciation of call and deny additional questions or concerns at this time.

## 2023-10-18 NOTE — Telephone Encounter (Signed)
 Pt c/o BP issue: STAT if pt c/o blurred vision, one-sided weakness or slurred speech  1. What are your last 5 BP readings? 114/68, 99/49, 112/64, 121/69  2. Are you having any other symptoms (ex. Dizziness, headache, blurred vision, passed out)? Dizziness,headache, fatigue  3. What is your BP issue? He feels his bp is too low

## 2023-10-19 DIAGNOSIS — H40053 Ocular hypertension, bilateral: Secondary | ICD-10-CM | POA: Diagnosis not present

## 2023-10-26 NOTE — Progress Notes (Unsigned)
 Cardiology Clinic Note   Patient Name: Anthony [name]. Date of Encounter: 10/26/2023  Primary Care Provider:  Karie Schwalbe, MD Primary Cardiologist:  Nicki Guadalajara, MD  Patient Profile    Anthony [name]. Gasparini 73 year old male presents the clinic today for follow-up evaluation of his chest pain and HTN.  Past Medical History    Past Medical History:  Diagnosis Date   Benign neoplasm of colon    Benign neoplasm of skin of upper limb, including shoulder    Cancer of skin of external cheek    Carpal tunnel syndrome    Chest pain    a. normal cors by cath in 2015   Chronic diastolic HF (heart failure) (HCC)    Diaphragmatic hernia without mention of obstruction or gangrene    Dyslipidemia    Elevated prostate specific antigen (PSA)    GERD (gastroesophageal reflux disease)    Herpes zoster without mention of complication    History of cancer of external ear    History of hiatal hernia    Hypertension    Hypertrophy of prostate with urinary obstruction and other lower urinary tract symptoms (LUTS)    Impotence of organic origin    Lumbago    Obesity, unspecified    Osteoarthrosis, unspecified whether generalized or localized, lower leg    Other and unspecified hyperlipidemia    Other seborrheic keratosis    Pneumonia 2004   Stenosis, cervical spine    Unspecified tinnitus    Past Surgical History:  Procedure Laterality Date   ANTERIOR CERVICAL DECOMP/DISCECTOMY FUSION N/A 02/19/2018   Procedure: ANTERIOR CERVICAL DECOMPRESSION/DISCECTOMY FUSION - CERVICAL FOUR-CERVICAL FIVE - CERVICAL FIVE-CERVICAL SIX;  Surgeon: Julio Sicks, MD;  Location: [facility] OR;  Service: Neurosurgery;  Laterality: N/A;   CARDIOVASCULAR STRESS TEST  06/15/2012   Non-diagnostic for ischemia. No lexiscan EKG changes.   CHOLECYSTECTOMY  08/22/1990   COLONOSCOPY  2022   EYE SURGERY     eyeband  08/22/2004   removal of right eyeband   HAND SURGERY  07/2022   right pointer knuckle replaced   INGUINAL  HERNIA REPAIR  08/22/1982   left   INTRAOCULAR LENS REMOVAL  08/22/2006   right   LEFT HEART CATHETERIZATION WITH CORONARY ANGIOGRAM N/A 09/05/2013   Procedure: LEFT HEART CATHETERIZATION WITH CORONARY ANGIOGRAM;  Surgeon: Lennette Bihari, MD;  Location: [facility] CATH LAB;  Service: Cardiovascular;  Laterality: N/A;   LOWER VENOUS EXTREMITY DOPPLER  01/26/2011   No evidence of thrombus or thrombophlebitis.   RETINAL DETACHMENT SURGERY  08/23/1999   right   TOTAL HIP ARTHROPLASTY  08/22/2010   bilateral   TOTAL KNEE ARTHROPLASTY  2011, 2012   bilateral   TRANSTHORACIC ECHOCARDIOGRAM  10/05/2009   EF >55%, normal LV size, systolic, and diastolic function.   VENTRAL HERNIA REPAIR N/A 04/28/2023   Procedure: LAPAROSCOPIC VENTRAL HERNIA REPAIR WITH MESH;  Surgeon: [name], MD;  Location: [facility] OR;  Service: General;  Laterality: N/A;    Allergies  Allergies  Allergen Reactions   Lipitor [Atorvastatin] Other (See Comments)    Leg pain/cramps; statin myopathy   Red Yeast Rice [Cholestin] Other (See Comments)    Makes legs hurt, myopathy   Statins Other (See Comments)    "muscle break down", statin myopathy   Statins Support [Acid Blockers Support] Other (See Comments)    Leg pain   Codeine Other (See Comments)    Headache   Latex Rash    History of Present Illness  Anthony [name] has a PMH of hypertension, dysphagia, esophageal stenosis, GERD, acid reflux, DJD, BPH, mixed hyperlipidemia, morbid obesity, clinical depression, and chest pain.  He underwent nuclear stress test 10/13 for chest pain which showed normal perfusion without scar or ischemia.  An echocardiogram 1/15 showed an ejection fraction of 55-60%, G1 DD mild left ventricular hypertrophy, mild mitral annular calcification with mildly thickened leaflets and trivial mitral regurgitation.  It was felt that his chest pain was most likely musculoskeletal.  A CTA in 2020 showed minimal coronary plaque involving his LAD ostium and OM 1.   He was also noted to have atherosclerosis of his thoracic aorta.   He was seen by Dr. Tresa Endo 6/21 with mild chest tightness for 3 to 4 weeks.  He reported symptoms were not always with exertion.  He reported decreased energy.  He was having difficulty with his reflux.  He also reported some sharp pain and discomfort which was going to his back and was nonexertional.  He was seen Dr. Matthias Hughs for esophageal dilation in the future.  He reported he had stopped his statin therapy and Nexlizet was recommended.  Weight loss and OSA were also discussed.   He was last seen and evaluated by Dr. Tresa Endo on 09/28/2020.  He had not pursued sleep evaluation.  He felt he was sleeping well and reported typically sleeping 8 to 9 hours per night.  He denied any exertion symptomology but did note very sharp twinges anteriorly at times.  He continued to have issues with his neck and had an ablation procedure planned.  He remains sedentary due to arthritic issues and post bilateral knee and hip replacement surgeries.  He was tolerating his ezetimibe well.  Lipid panel showed LDL of 113.  Fish oil was recommended.  Improving his diet was discussed.  Follow-up was planned for 1 year and as needed.   He presented to [facility] ED 11/16/2020.  He reported several days of chest pain with no associated nausea or vomiting.  He denied shortness of breath.  His blood pressure at that time was 161/84 with pulse of 74.  He reported that the pain was in the center of his chest, radiated to his jaw and down his right arm.  He also had shortness of breath and headache.  His lab work was unremarkable.  Chest x-ray showed no acute abnormalities.  EKG showed normal sinus rhythm.  High-sensitivity troponins negative.   He presented to the clinic 4/8/ 22 for follow-up evaluation stated he was at the [facility] visit at [facility] when they insisted he present to the emergency department for further evaluation.  He reported that he was having some pressure across  his chest that he felt was related to reflux.  He had not had any further episodes of chest pain.  He brought blood pressure log  which showed average blood pressures in the 150s over 80s at home.  His blood pressure in the office was well controlled at 130/78.  I  added metoprolol tartrate 25 mg twice daily to his medication regimen, asked him to maintain a blood pressure log, gave him the salty 6 diet sheet, and had him increase physical activity as tolerated.  We planned follow-up in 1 month.   He presented to the clinic 12/28/2020 for follow-up evaluation stated he felt fairly well .  He had noticed increased work of breathing with increased physical activity.  He had also had a 5.5 pound weight increase since his visit 1 month ago.  He reported increased lower extremity edema.  He also reported dietary indiscretion.  I will gave him the salty 6 diet sheet, had him increase physical activity as tolerated, increased his Lasix to 40 mg daily, changed his metoprolol to succinate 25 daily, ordered a BMP in 1 week and planned follow-up in 1 month.  His BMP was unremarkable.  Presented to clinic 02/09/21 for follow-up evaluation stated he had much better blood pressures at home.  He reported his systolic blood pressures were in the 125-130 range.  He had been tolerating his increased furosemide dosing well.  We reviewed his most recent lab work.  He reported that he had been noticing less lower extremity edema.  He was limited in his physical activity by his joint pain.  We discussed increasing his physical activity through activities such as swimming and bicycling.  He did note some increased fatigue after doing bouts of yard work.  We  continued his current medication regimen, continued heart healthy low-sodium diet, asked him to increase his physical activity as tolerated with a goal of 150 minutes of moderate physical activity per week and planned follow-up in 6 months.  He was seen by Dr. Tresa Endo 08/02/2021.   During that time he denied chest pain and shortness of breath.  He continued to have knee and hip pain.  His ailments limited to his physical activity.  He was occasionally taking extra furosemide for increased lower extremity swelling.  He presented to the clinic 02/08/22 for follow-up evaluation stated he notices shortness of breath with walking distances over about 100 feet.  He reported that he was limited in his physical activity due to his right hip pain.  He was planning to go back to the gym.  He previously enjoyed swimming and using an exercise bike.  We reviewed his previous CTA and he is cholesterol.  He will be going to his PCP in the next few weeks to have labs redrawn.  He was felt to be a candidate for PCSK9 inhibitor and was willing to try PCSK9 if needed.  I have asked him to take a daily fiber supplement, increase physical activity slowly, and we planned follow-up in 6 months.  He continue to follow-up with cardiology and was last seen by Dr. Tresa Endo 07/24/23.  During that time he reported that he had undergone laparoscopic assisted ventral hernia repair with mesh.  He tolerated the surgery well.  He underwent preoperative echo which showed LVEF of 65-70% and mild LVH.  He was also noted to have some aortic sclerosis without stenosis.  He reported that he felt well.  He did notice some weight gain.  He also noted some ankle swelling.  He reported drinking sweet tea frequently.  He was continued on amlodipine, carvedilol, furosemide, olmesartan, spironolactone and Nexium.  It was recommended that he stop drinking sweet tea.  He was given permission to take an extra furosemide as needed for lower extremity edema.  1 year follow-up was recommended.  Dr. Bjorn Pippin was recommended to continue his cardiac care.  He presents to the clinic today for follow-up evaluation and states***.   Today he denies chest pain, shortness of breath, fatigue, palpitations, melena, hematuria, hemoptysis, diaphoresis,  weakness, presyncope, syncope, orthopnea, and PND.  Home Medications    Prior to Admission medications   Medication Sig Start Date End Date Taking? Authorizing Provider  amLODipine (NORVASC) 10 MG tablet TAKE 1 TABLET BY MOUTH EVERY DAY IN THE MORNING 07/28/20   Lennette Bihari, MD  aspirin EC 81 MG tablet Take 81 mg by mouth every morning.    [provider]  benazepril (LOTENSIN) 40 MG tablet TAKE 1 TABLET BY MOUTH EVERY DAY 09/21/20   Reed, Tiffany L, DO  brimonidine (ALPHAGAN P) 0.1 % SOLN     [provider]  cyclobenzaprine (FLEXERIL) 10 MG tablet Take 10 mg by mouth 3 (three) times daily. As needed 05/10/20   [provider]  dorzolamide-timolol (COSOPT) 22.3-6.8 MG/ML ophthalmic solution Place 2 drops into the right eye 2 (two) times daily.     [provider]  esomeprazole (NEXIUM) 40 MG capsule TAKE 1 CAPSULE BY MOUTH AT BEDTIME. FOR REFLUX 09/21/20   Reed, Tiffany L, DO  ezetimibe (ZETIA) 10 MG tablet Take 1 tablet (10 mg total) by mouth daily. 01/20/21   Frederica Kuster, MD  finasteride (PROSCAR) 5 MG tablet TAKE 1 TABLET BY MOUTH EVERY DAY IN THE MORNING 06/02/20   Reed, Tiffany L, DO  furosemide (LASIX) 20 MG tablet Take 1 tablet (20 mg total) every other day, alternating with 2 tablets (40 mg total) every other day. 01/05/21   Ronney Asters, NP  metoprolol succinate (TOPROL XL) 25 MG 24 hr tablet Take 1.5 tablets (37.5 mg total) by mouth daily. 01/06/21   Ronney Asters, NP  Omega-3 Fatty Acids (FISH OIL PO) Take 1,400 mg by mouth.    [provider]  Polyvinyl Alcohol-Povidone (REFRESH OP) Place 1 drop into the right eye daily as needed (dry eyes/irritation).    [provider]  spironolactone (ALDACTONE) 25 MG tablet TAKE 1 TABLET (25 MG TOTAL) BY MOUTH DAILY. 12/11/20   Lennette Bihari, MD    Family History    Family History  Problem Relation Age of Onset   Diabetes Mother    Arrhythmia Mother    Hypertension Mother     Dementia Father    Pulmonary embolism Father    Hypertension Father    Cancer Sister        Liver cancer - Histocytic lymphoma   Cancer Sister 26       Skin cancer   Hypertension Sister 84   Diabetes Sister    Stroke Brother    Diabetes Brother    Hypertension Brother    Cancer Daughter    Cancer Maternal Grandmother        Skin cancer   Heart attack Maternal Grandfather    Cancer Paternal Grandfather    Hypertension Paternal Grandfather    He indicated that his mother is deceased. He indicated that his father is deceased. He indicated that only one of his two sisters is alive. He indicated that his brother is deceased. He indicated that his maternal grandmother is deceased. He indicated that his maternal grandfather is deceased. He indicated that his paternal grandmother is deceased. He indicated that his paternal grandfather is deceased. He indicated that his daughter is alive. He indicated that his son is alive.   Social History    Social History   Socioeconomic History   Marital status: Married    Spouse name: Not on file   Number of children: 2   Years of education: Not on file   Highest education level: 12th grade  Occupational History   Occupation: Cabin crew --retired    Associate Professor: [employer]    Comment: And [employer]  Tobacco Use   Smoking status: Never    Passive exposure: Past   Smokeless tobacco: Never  Vaping Use  Vaping status: Never Used  Substance and Sexual Activity   Alcohol use: No   Drug use: No   Sexual activity: Yes    Partners: Female    Comment: wife  Other Topics Concern   Not on file  Social History Narrative   Daughter and son      Living Will   Wife is health care POA--daughter is alternate   Would accept resuscitation attempts--but no prolonged ventilation or tube feeds   Social Drivers of Health   Financial Resource Strain: Low Risk  (02/28/2023)   Overall Financial Resource Strain (CARDIA)    Difficulty of Paying Living  Expenses: Not hard at all  Food Insecurity: No Food Insecurity (04/28/2023)   Hunger Vital Sign    Worried About Running Out of Food in the Last Year: Never true    Ran Out of Food in the Last Year: Never true  Transportation Needs: No Transportation Needs (04/28/2023)   PRAPARE - Administrator, Civil Service (Medical): No    Lack of Transportation (Non-Medical): No  Physical Activity: Insufficiently Active (02/28/2023)   Exercise Vital Sign    Days of Exercise per Week: 1 day    Minutes of Exercise per Session: 20 min  Stress: No Stress Concern Present (02/28/2023)   [employer] of Occupational Health - Occupational Stress Questionnaire    Feeling of Stress : Only a little  Social Connections: Moderately Integrated (02/28/2023)   Social Connection and Isolation Panel [NHANES]    Frequency of Communication with Friends and Family: More than three times a week    Frequency of Social Gatherings with Friends and Family: Three times a week    Attends Religious Services: Never    Active Member of Clubs or Organizations: Yes    Attends Banker Meetings: More than 4 times per year    Marital Status: Married  Catering manager Violence: Not At Risk (04/28/2023)   Humiliation, Afraid, Rape, and Kick questionnaire    Fear of Current or Ex-Partner: No    Emotionally Abused: No    Physically Abused: No    Sexually Abused: No     Review of Systems    General:  No chills, fever, night sweats or weight changes.  Cardiovascular:  No chest pain, dyspnea on exertion, edema, orthopnea, palpitations, paroxysmal nocturnal dyspnea. Dermatological: No rash, lesions/masses Respiratory: No cough, dyspnea Urologic: No hematuria, dysuria Abdominal:   No nausea, vomiting, diarrhea, bright red blood per rectum, melena, or hematemesis Neurologic:  No visual changes, wkns, changes in mental status. All other systems reviewed and are otherwise negative except as noted above.  Physical  Exam    VS:  There were no vitals taken for this visit. , BMI There is no height or weight on file to calculate BMI. GEN: Well nourished, well developed, in no acute distress. HEENT: normal. Neck: Supple, no JVD, carotid bruits, or masses. Cardiac: RRR, no murmurs, rubs, or gallops. No clubbing, cyanosis, edema.  Radials/DP/PT 2+ and equal bilaterally.  Respiratory:  Respirations regular and unlabored, clear to auscultation bilaterally. GI: Soft, nontender, nondistended, BS + x 4. MS: no deformity or atrophy. Skin: warm and dry, no rash. Neuro:  Strength and sensation are intact. Psych: Normal affect.  Accessory Clinical Findings    Recent Labs: 11/08/2022: ALT 25 04/27/2023: BUN 18; Creatinine, Ser 1.07; Hemoglobin 15.0; Platelets 218; Potassium 3.8; Sodium 142   Recent Lipid Panel    Component Value Date/Time   CHOL 127 11/08/2022 1214  CHOL 191 07/22/2013 0835   TRIG 171 (H) 11/08/2022 1214   TRIG 253 (H) 07/22/2013 0835   HDL 38 (L) 11/08/2022 1214   HDL 38 (L) 07/22/2013 0835   CHOLHDL 3.3 11/08/2022 1214   CHOLHDL 3.6 03/30/2022 0858   VLDL 35 (H) 10/27/2016 0825   LDLCALC 60 11/08/2022 1214   LDLCALC 97 03/30/2022 0858   LDLCALC 102 (H) 07/22/2013 0835    ECG personally reviewed by me today-  None today.   EKG 11/27/2020 normal sinus rhythm cannot rule out inferior infarct undetermined age 65 bpm- No acute changes   Echocardiogram 08/30/2018   Study Conclusions   - Left ventricle: The cavity size was normal. There was mild    concentric hypertrophy. Systolic function was normal. The    estimated ejection fraction was in the range of 60% to 65%. Wall    motion was normal; there were no regional wall motion    abnormalities. Features are consistent with a pseudonormal left    ventricular filling pattern, with concomitant abnormal relaxation    and increased filling pressure (grade 2 diastolic dysfunction).    Doppler parameters are consistent with elevated  ventricular    end-diastolic filling pressure.  - Aortic valve: There was no regurgitation.  - Mitral valve: There was mild regurgitation.  - Left atrium: The atrium was moderately dilated.  - Right ventricle: The cavity size was mildly dilated. Wall    thickness was normal. Systolic function was normal.  - Right atrium: The atrium was normal in size.  - Tricuspid valve: There was mild regurgitation.  - Pulmonic valve: There was mild regurgitation.  - Pulmonary arteries: Systolic pressure was within the normal    range. PA peak pressure: 35 mm Hg (S).  - Inferior vena cava: The vessel was normal in size.  - Pericardium, extracardiac: There was no pericardial effusion.   Coronary CTA 09/14/2018 IMPRESSION: 1. Coronary calcium score of 26.4. This was 28th percentile for age and sex matched control.   2. Normal coronary origin with right dominance.   3. Minimal plaque in ostial LAD and OM1.   Chilton Si, MD  Assessment & Plan   1. Grade 2 diastolic dysfunction/bilateral lower extremity edema-trace bilateral ankle edema.  Reports following a low-sodium diet.  Weight today***. Continue furosemide Continue Daily weights Continue lower extremity support stockings Plan for repeat echocardiogram when clinically indicated.   Essential hypertension-BP today 122/***64.   Maintain blood pressure log Continue amlodipine, olmesartan, furosemide, carvedilol Low-sodium diet  Chest pain-denies anginal symptoms.  Reassuring coronary CTA 09/14/2018 which showed calcium score of 26.4 and minimal plaque in his ostial LAD and OM1. Continue amlodipine, aspirin Maintain physical activity  Hyperlipidemia-LDL 104***.  Statin intolerant.  Continue omega-3 fatty acids Heart healthy low-sodium high-fiber diet Increase physical activity as tolerated  Follows with PCP  Morbid obesity-weight today 28***5 pounds.  Previously limited in physical activity due to right hip pain.***s planning to go back  to the gym to do low impact type exercises. Continue weight loss Increase physical activity as tolerated Heart healthy low-sodium diet  Disposition: Follow-up with Dr. Bjorn Pippin or me in 9-12 months.  Anthony [name]. Marcedes Tech NP-C    10/26/2023, 1:07 PM [facility] [address] Office [phone] Fax [phone]  Notice: This dictation was prepared with Dragon dictation along with smaller phrase technology. Any transcriptional errors that result from this process are unintentional and may not be corrected upon review.  I spent 14*** minutes  examining this patient, reviewing medications, and using patient centered shared decision making involving her cardiac care.  I spent  20 minutes reviewing there past medical history,  medications, and prior cardiac tests.

## 2023-10-27 ENCOUNTER — Encounter (HOSPITAL_BASED_OUTPATIENT_CLINIC_OR_DEPARTMENT_OTHER): Payer: Self-pay

## 2023-10-30 ENCOUNTER — Encounter: Payer: Self-pay | Admitting: General Practice

## 2023-10-30 ENCOUNTER — Ambulatory Visit: Payer: PPO | Attending: General Practice | Admitting: General Practice

## 2023-10-30 VITALS — BP 116/56 | HR 64 | Ht 70.0 in | Wt 255.0 lb

## 2023-10-30 DIAGNOSIS — I10 Essential (primary) hypertension: Secondary | ICD-10-CM | POA: Diagnosis not present

## 2023-10-30 DIAGNOSIS — E785 Hyperlipidemia, unspecified: Secondary | ICD-10-CM | POA: Diagnosis not present

## 2023-10-30 DIAGNOSIS — R079 Chest pain, unspecified: Secondary | ICD-10-CM | POA: Diagnosis not present

## 2023-10-30 DIAGNOSIS — I5189 Other ill-defined heart diseases: Secondary | ICD-10-CM | POA: Diagnosis not present

## 2023-10-30 DIAGNOSIS — I5032 Chronic diastolic (congestive) heart failure: Secondary | ICD-10-CM

## 2023-10-30 MED ORDER — AMLODIPINE BESYLATE 5 MG PO TABS: 2.5000 mg | ORAL_TABLET | Freq: Every day | ORAL | 6 refills | Status: DC

## 2023-10-30 NOTE — Addendum Note (Signed)
 Addended by: Alyson Ingles on: 10/30/2023 03:26 PM   Modules accepted: Orders

## 2023-10-30 NOTE — Patient Instructions (Addendum)
 Medication Instructions:  DECREASE AMLODIPINE 2.5MG  DAILY (1/2 TAB) *If you need a refill on your cardiac medications before your next appointment, please call your pharmacy*  Lab Work: FASTING LIPID If you have labs (blood work) drawn today and your tests are completely normal, you will receive your results only by:  MyChart Message (if you have MyChart) OR  A paper copy in the mail If you have any lab test that is abnormal or we need to change your treatment, we will call you to review the results.  Follow-Up: At [facility], you and your health needs are our priority.  As part of our continuing mission to provide you with exceptional heart care, we have created designated Provider Care Teams.  These Care Teams include your primary Cardiologist (physician) and Advanced Practice Providers (APPs -  Physician Assistants and Nurse Practitioners) who all work together to provide you with the care you need, when you need it.  We recommend signing up for the patient portal called "MyChart".  Sign up information is provided on this After Visit Summary.  MyChart is used to connect with patients for Virtual Visits (Telemedicine).  Patients are able to view lab/test results, encounter notes, upcoming appointments, etc.  Non-urgent messages can be sent to your provider as well.   To learn more about what you can do with MyChart, go to [email or web address].    Your next appointment:   4 month(s)  Provider:   Little Ishikawa, MD  or Edd Fabian, FNP       Other Instructions MAINTAIN DIET AND EXERCISE

## 2023-11-14 ENCOUNTER — Encounter: Payer: Self-pay | Admitting: Internal Medicine

## 2023-11-15 MED ORDER — FAMOTIDINE 40 MG PO TABS: 40.0000 mg | ORAL_TABLET | Freq: Every day | ORAL | 3 refills | Status: AC

## 2023-11-17 DIAGNOSIS — I5189 Other ill-defined heart diseases: Secondary | ICD-10-CM | POA: Diagnosis not present

## 2023-11-17 DIAGNOSIS — I10 Essential (primary) hypertension: Secondary | ICD-10-CM | POA: Diagnosis not present

## 2023-11-17 DIAGNOSIS — R079 Chest pain, unspecified: Secondary | ICD-10-CM | POA: Diagnosis not present

## 2023-11-17 DIAGNOSIS — I5032 Chronic diastolic (congestive) heart failure: Secondary | ICD-10-CM | POA: Diagnosis not present

## 2023-11-17 DIAGNOSIS — E785 Hyperlipidemia, unspecified: Secondary | ICD-10-CM | POA: Diagnosis not present

## 2023-11-17 LAB — LIPID PANEL
Chol/HDL Ratio: 4 ratio (ref 0.0–5.0)
Cholesterol, Total: 168 mg/dL (ref 100–199)
HDL: 42 mg/dL (ref >39)
LDL Chol Calc (NIH): 104 mg/dL — ABNORMAL HIGH (ref 0–99)
Triglycerides: 121 mg/dL (ref 0–149)
VLDL Cholesterol Cal: 22 mg/dL (ref 5–40)

## 2023-11-22 ENCOUNTER — Other Ambulatory Visit: Payer: Self-pay | Admitting: Internal Medicine

## 2023-11-25 ENCOUNTER — Other Ambulatory Visit: Payer: Self-pay | Admitting: Internal Medicine

## 2023-11-25 DIAGNOSIS — E785 Hyperlipidemia, unspecified: Secondary | ICD-10-CM

## 2023-11-25 DIAGNOSIS — I10 Essential (primary) hypertension: Secondary | ICD-10-CM

## 2023-11-28 ENCOUNTER — Other Ambulatory Visit: Payer: Self-pay | Admitting: Internal Medicine

## 2023-11-28 ENCOUNTER — Telehealth: Payer: Self-pay

## 2023-11-28 MED ORDER — OMEPRAZOLE 40 MG PO CPDR: 40.0000 mg | DELAYED_RELEASE_CAPSULE | Freq: Every day | ORAL | 3 refills | Status: DC

## 2023-11-28 NOTE — Addendum Note (Signed)
 Addended by: Tillman Abide I on: 11/28/2023 01:19 PM   Modules accepted: Orders

## 2023-11-28 NOTE — Telephone Encounter (Signed)
 Spoke to pt. Advised him of Dr Karle Starch message. He said that was fine. Appreciated the omeprazole refill.

## 2023-11-28 NOTE — Telephone Encounter (Signed)
 Let him know I sent the refill on the omeprazole. For some reason, he also had pantoprazole on his list--and he should not be taking both (so I took that off his list)

## 2023-11-28 NOTE — Telephone Encounter (Signed)
 Copied from CRM [id number]. Topic: Clinical - Prescription Issue >> Nov 28, 2023 11:23 AM Alcus Dad wrote: Reason for CRM: Patient's wife stated that patient was denied his medication (omeprazole (PRILOSEC) 40 MG capsule) by pulmonary. "Wife stated that patient needs medication and was wondering if he could get it filled by his PCP. Please contact patient regarding this matter asap.

## 2023-12-13 DIAGNOSIS — H903 Sensorineural hearing loss, bilateral: Secondary | ICD-10-CM | POA: Diagnosis not present

## 2023-12-14 DIAGNOSIS — L814 Other melanin hyperpigmentation: Secondary | ICD-10-CM | POA: Diagnosis not present

## 2023-12-14 DIAGNOSIS — L821 Other seborrheic keratosis: Secondary | ICD-10-CM | POA: Diagnosis not present

## 2023-12-14 DIAGNOSIS — M1812 Unilateral primary osteoarthritis of first carpometacarpal joint, left hand: Secondary | ICD-10-CM | POA: Diagnosis not present

## 2023-12-14 DIAGNOSIS — L578 Other skin changes due to chronic exposure to nonionizing radiation: Secondary | ICD-10-CM | POA: Diagnosis not present

## 2023-12-14 DIAGNOSIS — Z85828 Personal history of other malignant neoplasm of skin: Secondary | ICD-10-CM | POA: Diagnosis not present

## 2023-12-14 DIAGNOSIS — D485 Neoplasm of uncertain behavior of skin: Secondary | ICD-10-CM | POA: Diagnosis not present

## 2023-12-14 DIAGNOSIS — D2371 Other benign neoplasm of skin of right lower limb, including hip: Secondary | ICD-10-CM | POA: Diagnosis not present

## 2023-12-14 DIAGNOSIS — L723 Sebaceous cyst: Secondary | ICD-10-CM | POA: Diagnosis not present

## 2023-12-14 DIAGNOSIS — L820 Inflamed seborrheic keratosis: Secondary | ICD-10-CM | POA: Diagnosis not present

## 2023-12-14 DIAGNOSIS — D225 Melanocytic nevi of trunk: Secondary | ICD-10-CM | POA: Diagnosis not present

## 2023-12-18 DIAGNOSIS — H40053 Ocular hypertension, bilateral: Secondary | ICD-10-CM | POA: Diagnosis not present

## 2024-01-10 ENCOUNTER — Encounter: Payer: Self-pay | Admitting: Cardiology

## 2024-01-23 DIAGNOSIS — M1812 Unilateral primary osteoarthritis of first carpometacarpal joint, left hand: Secondary | ICD-10-CM | POA: Diagnosis not present

## 2024-01-24 ENCOUNTER — Encounter: Payer: Self-pay | Admitting: Cardiology

## 2024-02-15 DIAGNOSIS — D171 Benign lipomatous neoplasm of skin and subcutaneous tissue of trunk: Secondary | ICD-10-CM | POA: Diagnosis not present

## 2024-02-15 DIAGNOSIS — L720 Epidermal cyst: Secondary | ICD-10-CM | POA: Diagnosis not present

## 2024-02-15 DIAGNOSIS — L728 Other follicular cysts of the skin and subcutaneous tissue: Secondary | ICD-10-CM | POA: Diagnosis not present

## 2024-02-15 DIAGNOSIS — L723 Sebaceous cyst: Secondary | ICD-10-CM | POA: Diagnosis not present

## 2024-02-21 ENCOUNTER — Encounter: Payer: Self-pay | Admitting: General Practice

## 2024-02-21 ENCOUNTER — Ambulatory Visit: Attending: General Practice | Admitting: General Practice

## 2024-02-21 VITALS — BP 138/76 | HR 71 | Ht 68.0 in | Wt 234.0 lb

## 2024-02-21 DIAGNOSIS — I5032 Chronic diastolic (congestive) heart failure: Secondary | ICD-10-CM | POA: Diagnosis not present

## 2024-02-21 DIAGNOSIS — I5189 Other ill-defined heart diseases: Secondary | ICD-10-CM | POA: Diagnosis not present

## 2024-02-21 DIAGNOSIS — R079 Chest pain, unspecified: Secondary | ICD-10-CM | POA: Diagnosis not present

## 2024-02-21 DIAGNOSIS — I10 Essential (primary) hypertension: Secondary | ICD-10-CM

## 2024-02-21 DIAGNOSIS — E785 Hyperlipidemia, unspecified: Secondary | ICD-10-CM

## 2024-02-21 MED ORDER — FUROSEMIDE 20 MG PO TABS
20.0000 mg | ORAL_TABLET | Freq: Every day | ORAL | 3 refills | Status: AC
Start: 2024-02-21 — End: ?

## 2024-02-21 MED ORDER — AMLODIPINE BESYLATE 5 MG PO TABS
2.5000 mg | ORAL_TABLET | Freq: Every day | ORAL | 3 refills | Status: AC
Start: 2024-02-21 — End: ?

## 2024-02-21 MED ORDER — NITROGLYCERIN 0.4 MG SL SUBL: 0.4000 mg | SUBLINGUAL_TABLET | SUBLINGUAL | 11 refills | Status: AC | PRN

## 2024-02-21 MED ORDER — CARVEDILOL 6.25 MG PO TABS: 6.2500 mg | ORAL_TABLET | Freq: Two times a day (BID) | ORAL | 3 refills | Status: AC

## 2024-02-21 MED ORDER — OMEPRAZOLE 40 MG PO CPDR: 40.0000 mg | DELAYED_RELEASE_CAPSULE | Freq: Every day | ORAL | 3 refills | Status: DC

## 2024-02-21 NOTE — Progress Notes (Signed)
 Cardiology Clinic Note   Patient Name: Anthony [name]. Date of Encounter: 02/21/2024  Primary Care Provider:  Jimmy Charlie FERNS, MD Primary Cardiologist:  Lonni LITTIE Nanas, MD  Patient Profile    Anthony [name]. Stys 73 year old male presents the clinic today for follow-up evaluation of his chest pain and HTN.  Past Medical History    Past Medical History:  Diagnosis Date   Benign neoplasm of colon    Benign neoplasm of skin of upper limb, including shoulder    Cancer of skin of external cheek    Carpal tunnel syndrome    Chest pain    a. normal cors by cath in 2015   Chronic diastolic HF (heart failure) (HCC)    Diaphragmatic hernia without mention of obstruction or gangrene    Dyslipidemia    Elevated prostate specific antigen (PSA)    GERD (gastroesophageal reflux disease)    Herpes zoster without mention of complication    History of cancer of external ear    History of hiatal hernia    Hypertension    Hypertrophy of prostate with urinary obstruction and other lower urinary tract symptoms (LUTS)    Impotence of organic origin    Lumbago    Obesity, unspecified    Osteoarthrosis, unspecified whether generalized or localized, lower leg    Other and unspecified hyperlipidemia    Other seborrheic keratosis    Pneumonia 2004   Stenosis, cervical spine    Unspecified tinnitus    Past Surgical History:  Procedure Laterality Date   ANTERIOR CERVICAL DECOMP/DISCECTOMY FUSION N/A 02/19/2018   Procedure: ANTERIOR CERVICAL DECOMPRESSION/DISCECTOMY FUSION - CERVICAL FOUR-CERVICAL FIVE - CERVICAL FIVE-CERVICAL SIX;  Surgeon: Louis Shove, MD;  Location: [facility] OR;  Service: Neurosurgery;  Laterality: N/A;   CARDIOVASCULAR STRESS TEST  06/15/2012   Non-diagnostic for ischemia. No lexiscan  EKG changes.   CHOLECYSTECTOMY  08/22/1990   COLONOSCOPY  2022   EYE SURGERY     eyeband  08/22/2004   removal of right eyeband   HAND SURGERY  07/2022   right pointer knuckle replaced    INGUINAL HERNIA REPAIR  08/22/1982   left   INTRAOCULAR LENS REMOVAL  08/22/2006   right   LEFT HEART CATHETERIZATION WITH CORONARY ANGIOGRAM N/A 09/05/2013   Procedure: LEFT HEART CATHETERIZATION WITH CORONARY ANGIOGRAM;  Surgeon: Debby DELENA Sor, MD;  Location: [facility] CATH LAB;  Service: Cardiovascular;  Laterality: N/A;   LOWER VENOUS EXTREMITY DOPPLER  01/26/2011   No evidence of thrombus or thrombophlebitis.   RETINAL DETACHMENT SURGERY  08/23/1999   right   TOTAL HIP ARTHROPLASTY  08/22/2010   bilateral   TOTAL KNEE ARTHROPLASTY  2011, 2012   bilateral   TRANSTHORACIC ECHOCARDIOGRAM  10/05/2009   EF >55%, normal LV size, systolic, and diastolic function.   VENTRAL HERNIA REPAIR N/A 04/28/2023   Procedure: LAPAROSCOPIC VENTRAL HERNIA REPAIR WITH MESH;  Surgeon: Curvin Mt III, MD;  Location: [facility] OR;  Service: General;  Laterality: N/A;    Allergies  Allergies  Allergen Reactions   Lipitor [Atorvastatin] Other (See Comments)    Leg pain/cramps; statin myopathy   Red Yeast Rice [Cholestin] Other (See Comments)    Makes legs hurt, myopathy   Statins Other (See Comments)    muscle break down, statin myopathy   Statins Support [Acid Blockers Support] Other (See Comments)    Leg pain   Codeine Other (See Comments)    Headache   Latex Rash    History of Present Illness  Anthony [name] has a PMH of hypertension, dysphagia, esophageal stenosis, GERD, acid reflux, DJD, BPH, mixed hyperlipidemia, morbid obesity, clinical depression, and chest pain.  He underwent nuclear stress test 10/13 for chest pain which showed normal perfusion without scar or ischemia.  An echocardiogram 1/15 showed an ejection fraction of 55-60%, G1 DD mild left ventricular hypertrophy, mild mitral annular calcification with mildly thickened leaflets and trivial mitral regurgitation.  It was felt that his chest pain was most likely musculoskeletal.  A CTA in 2020 showed minimal coronary plaque involving his LAD ostium  and OM 1.  He was also noted to have atherosclerosis of his thoracic aorta.   He was seen by Dr. Burnard 6/21 with mild chest tightness for 3 to 4 weeks.  He reported symptoms were not always with exertion.  He reported decreased energy.  He was having difficulty with his reflux.  He also reported some sharp pain and discomfort which was going to his back and was nonexertional.  He was seen Dr. Donnald for esophageal dilation in the future.  He reported he had stopped his statin therapy and Nexlizet  was recommended.  Weight loss and OSA were also discussed.   He was last seen and evaluated by Dr. Burnard on 09/28/2020.  He had not pursued sleep evaluation.  He felt he was sleeping well and reported typically sleeping 8 to 9 hours per night.  He denied any exertion symptomology but did note very sharp twinges anteriorly at times.  He continued to have issues with his neck and had an ablation procedure planned.  He remains sedentary due to arthritic issues and post bilateral knee and hip replacement surgeries.  He was tolerating his ezetimibe  well.  Lipid panel showed LDL of 113.  Fish oil was recommended.  Improving his diet was discussed.  Follow-up was planned for 1 year and as needed.   He presented to [facility] ED 11/16/2020.  He reported several days of chest pain with no associated nausea or vomiting.  He denied shortness of breath.  His blood pressure at that time was 161/84 with pulse of 74.  He reported that the pain was in the center of his chest, radiated to his jaw and down his right arm.  He also had shortness of breath and headache.  His lab work was unremarkable.  Chest x-ray showed no acute abnormalities.  EKG showed normal sinus rhythm.  High-sensitivity troponins negative.   He presented to the clinic 4/8/ 22 for follow-up evaluation stated he was at the [facility] visit at [facility] when they insisted he present to the emergency department for further evaluation.  He reported that he was having some  pressure across his chest that he felt was related to reflux.  He had not had any further episodes of chest pain.  He brought blood pressure log  which showed average blood pressures in the 150s over 80s at home.  His blood pressure in the office was well controlled at 130/78.  I  added metoprolol  tartrate 25 mg twice daily to his medication regimen, asked him to maintain a blood pressure log, gave him the salty 6 diet sheet, and had him increase physical activity as tolerated.  We planned follow-up in 1 month.   He presented to the clinic 12/28/2020 for follow-up evaluation stated he felt fairly well .  He had noticed increased work of breathing with increased physical activity.  He had also had a 5.5 pound weight increase since his visit 1 month ago.  He reported increased lower extremity edema.  He also reported dietary indiscretion.  I will gave him the salty 6 diet sheet, had him increase physical activity as tolerated, increased his Lasix  to 40 mg daily, changed his metoprolol  to succinate 25 daily, ordered a BMP in 1 week and planned follow-up in 1 month.  His BMP was unremarkable.  Presented to clinic 02/09/21 for follow-up evaluation stated he had much better blood pressures at home.  He reported his systolic blood pressures were in the 125-130 range.  He had been tolerating his increased furosemide  dosing well.  We reviewed his most recent lab work.  He reported that he had been noticing less lower extremity edema.  He was limited in his physical activity by his joint pain.  We discussed increasing his physical activity through activities such as swimming and bicycling.  He did note some increased fatigue after doing bouts of yard work.  We  continued his current medication regimen, continued heart healthy low-sodium diet, asked him to increase his physical activity as tolerated with a goal of 150 minutes of moderate physical activity per week and planned follow-up in 6 months.  He was seen by Dr. Burnard  08/02/2021.  During that time he denied chest pain and shortness of breath.  He continued to have knee and hip pain.  His ailments limited to his physical activity.  He was occasionally taking extra furosemide  for increased lower extremity swelling.  He presented to the clinic 02/08/22 for follow-up evaluation stated he notices shortness of breath with walking distances over about 100 feet.  He reported that he was limited in his physical activity due to his right hip pain.  He was planning to go back to the gym.  He previously enjoyed swimming and using an exercise bike.  We reviewed his previous CTA and he is cholesterol.  He will be going to his PCP in the next few weeks to have labs redrawn.  He was felt to be a candidate for PCSK9 inhibitor and was willing to try PCSK9 if needed.  I have asked him to take a daily fiber supplement, increase physical activity slowly, and we planned follow-up in 6 months.  He continue to follow-up with cardiology and was last seen by Dr. Burnard 07/24/23.  During that time he reported that he had undergone laparoscopic assisted ventral hernia repair with mesh.  He tolerated the surgery well.  He underwent preoperative echo which showed LVEF of 65-70% and mild LVH.  He was also noted to have some aortic sclerosis without stenosis.  He reported that he felt well.  He did notice some weight gain.  He also noted some ankle swelling.  He reported drinking sweet tea frequently.  He was continued on amlodipine , carvedilol , furosemide , olmesartan , spironolactone  and Nexium .  It was recommended that he stop drinking sweet tea.  He was given permission to take an extra furosemide  as needed for lower extremity edema.  1 year follow-up was recommended.  Dr. Kate was recommended to continue his cardiac care.  He presented to the clinic 10/30/23 for follow-up evaluation and stated he had lost around 40 pounds.  He was planning to lose around 60 more.  He had done this with change in his  diet.  He reported that he was not necessarily more physically active.  He was limited in his physical activity due to his hip discomfort.  He started his reduced calorie diet before Christmas.  His blood pressure was 116/56.  He  reported an episode of dizziness that lasted for around 2 hours that morning.  I  decreased his amlodipine  to 2.5 mg.  I planned follow-up in 4 months and planned repeat fasting lipids in about 2 weeks.  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  He has been doing mowing, weed eating and clearing brush.  His weight today is 234 pounds.  This is down from 255 pounds when he was seen in March.  He reports that he has not had Benicar  in about 2 weeks.  His blood pressure today is 138/76.  His blood pressure has been similar at home.  He reports compliance with all of his other medications.  I will refill his cardiac medications, have him maintain his physical activity, continue his current diet and plan follow-up in 6 months.   Today he denies chest pain, shortness of breath, fatigue, palpitations, melena, hematuria, hemoptysis, diaphoresis, weakness, presyncope, syncope, orthopnea, and PND.  Home Medications    Prior to Admission medications   Medication Sig Start Date End Date Taking? Authorizing Provider  amLODipine  (NORVASC ) 10 MG tablet TAKE 1 TABLET BY MOUTH EVERY DAY IN THE MORNING 07/28/20   Burnard Debby LABOR, MD  aspirin EC 81 MG tablet Take 81 mg by mouth every morning.    [provider]  benazepril  (LOTENSIN ) 40 MG tablet TAKE 1 TABLET BY MOUTH EVERY DAY 09/21/20   Reed, Tiffany L, DO  brimonidine  (ALPHAGAN  P) 0.1 % SOLN     [provider]  cyclobenzaprine  (FLEXERIL ) 10 MG tablet Take 10 mg by mouth 3 (three) times daily. As needed 05/10/20   [provider]  dorzolamide -timolol  (COSOPT ) 22.3-6.8 MG/ML ophthalmic solution Place 2 drops into the right eye  2 (two) times daily.     [provider]  esomeprazole  (NEXIUM ) 40 MG capsule TAKE 1 CAPSULE BY MOUTH AT BEDTIME. FOR REFLUX 09/21/20   Reed, Tiffany L, DO  ezetimibe  (ZETIA ) 10 MG tablet Take 1 tablet (10 mg total) by mouth daily. 01/20/21   Cleotilde Garnette HERO, MD  finasteride  (PROSCAR ) 5 MG tablet TAKE 1 TABLET BY MOUTH EVERY DAY IN THE MORNING 06/02/20   Reed, Tiffany L, DO  furosemide  (LASIX ) 20 MG tablet Take 1 tablet (20 mg total) every other day, alternating with 2 tablets (40 mg total) every other day. 01/05/21   Emelia Josefa HERO, NP  metoprolol  succinate (TOPROL  XL) 25 MG 24 hr tablet Take 1.5 tablets (37.5 mg total) by mouth daily. 01/06/21   Emelia Josefa HERO, NP  Omega-3 Fatty Acids (FISH OIL PO) Take 1,400 mg by mouth.    [provider]  Polyvinyl Alcohol-Povidone (REFRESH OP) Place 1 drop into the right eye daily as needed (dry eyes/irritation).    [provider]  spironolactone  (ALDACTONE ) 25 MG tablet TAKE 1 TABLET (25 MG TOTAL) BY MOUTH DAILY. 12/11/20   Burnard Debby LABOR, MD    Family History    Family History  Problem Relation Age of Onset   Diabetes Mother    Arrhythmia Mother    Hypertension Mother    Dementia Father    Pulmonary embolism Father    Hypertension Father    Cancer Sister        Liver cancer - Histocytic lymphoma   Cancer Sister 61       Skin cancer   Hypertension Sister 54   Diabetes Sister    Stroke  Brother    Diabetes Brother    Hypertension Brother    Cancer Daughter    Cancer Maternal Grandmother        Skin cancer   Heart attack Maternal Grandfather    Cancer Paternal Grandfather    Hypertension Paternal Grandfather    He indicated that his mother is deceased. He indicated that his father is deceased. He indicated that only one of his two sisters is alive. He indicated that his brother is deceased. He indicated that his maternal grandmother is deceased. He indicated that his maternal grandfather is deceased. He indicated  that his paternal grandmother is deceased. He indicated that his paternal grandfather is deceased. He indicated that his daughter is alive. He indicated that his son is alive.   Social History    Social History   Socioeconomic History   Marital status: Married    Spouse name: Not on file   Number of children: 2   Years of education: Not on file   Highest education level: 12th grade  Occupational History   Occupation: Cabin crew --retired    Associate Professor: [employer]    Comment: And Dispensing optician  Tobacco Use   Smoking status: Never    Passive exposure: Past   Smokeless tobacco: Never  Vaping Use   Vaping status: Never Used  Substance and Sexual Activity   Alcohol use: No   Drug use: No   Sexual activity: Yes    Partners: Female    Comment: wife  Other Topics Concern   Not on file  Social History Narrative   Daughter and son      Living Will   Wife is health care POA--daughter is alternate   Would accept resuscitation attempts--but no prolonged ventilation or tube feeds   Social Drivers of Health   Financial Resource Strain: Low Risk  (02/28/2023)   Overall Financial Resource Strain (CARDIA)    Difficulty of Paying Living Expenses: Not hard at all  Food Insecurity: No Food Insecurity (04/28/2023)   Hunger Vital Sign    Worried About Running Out of Food in the Last Year: Never true    Ran Out of Food in the Last Year: Never true  Transportation Needs: No Transportation Needs (04/28/2023)   PRAPARE - Administrator, Civil Service (Medical): No    Lack of Transportation (Non-Medical): No  Physical Activity: Insufficiently Active (02/28/2023)   Exercise Vital Sign    Days of Exercise per Week: 1 day    Minutes of Exercise per Session: 20 min  Stress: No Stress Concern Present (02/28/2023)   [employer] of Occupational Health - Occupational Stress Questionnaire    Feeling of Stress : Only a little  Social Connections: Moderately Integrated (02/28/2023)   Social  Connection and Isolation Panel    Frequency of Communication with Friends and Family: More than three times a week    Frequency of Social Gatherings with Friends and Family: Three times a week    Attends Religious Services: Never    Active Member of Clubs or Organizations: Yes    Attends Banker Meetings: More than 4 times per year    Marital Status: Married  Catering manager Violence: Not At Risk (04/28/2023)   Humiliation, Afraid, Rape, and Kick questionnaire    Fear of Current or Ex-Partner: No    Emotionally Abused: No    Physically Abused: No    Sexually Abused: No     Review of Systems    General:  No chills, fever, night sweats or weight changes.  Cardiovascular:  No chest pain, dyspnea on exertion, edema, orthopnea, palpitations, paroxysmal nocturnal dyspnea. Dermatological: No rash, lesions/masses Respiratory: No cough, dyspnea Urologic: No hematuria, dysuria Abdominal:   No nausea, vomiting, diarrhea, bright red blood per rectum, melena, or hematemesis Neurologic:  No visual changes, wkns, changes in mental status. All other systems reviewed and are otherwise negative except as noted above.  Physical Exam    VS:  There were no vitals taken for this visit. , BMI There is no height or weight on file to calculate BMI. GEN: Well nourished, well developed, in no acute distress. HEENT: normal. Neck: Supple, no JVD, carotid bruits, or masses. Cardiac: RRR, no murmurs, rubs, or gallops. No clubbing, cyanosis, edema.  Radials/DP/PT 2+ and equal bilaterally.  Respiratory:  Respirations regular and unlabored, clear to auscultation bilaterally. GI: Soft, nontender, nondistended, BS + x 4. MS: no deformity or atrophy. Skin: warm and dry, no rash. Neuro:  Strength and sensation are intact. Psych: Normal affect.  Accessory Clinical Findings    Recent Labs: 04/27/2023: BUN 18; Creatinine, Ser 1.07; Hemoglobin 15.0; Platelets 218; Potassium 3.8; Sodium 142   Recent  Lipid Panel    Component Value Date/Time   CHOL 168 11/17/2023 0809   CHOL 191 07/22/2013 0835   TRIG 121 11/17/2023 0809   TRIG 253 (H) 07/22/2013 0835   HDL 42 11/17/2023 0809   HDL 38 (L) 07/22/2013 0835   CHOLHDL 4.0 11/17/2023 0809   CHOLHDL 3.6 03/30/2022 0858   VLDL 35 (H) 10/27/2016 0825   LDLCALC 104 (H) 11/17/2023 0809   LDLCALC 97 03/30/2022 0858   LDLCALC 102 (H) 07/22/2013 0835    ECG personally reviewed by me today-  None today.   EKG 11/27/2020 normal sinus rhythm cannot rule out inferior infarct undetermined age 4 bpm- No acute changes   Echocardiogram 08/30/2018   Study Conclusions   - Left ventricle: The cavity size was normal. There was mild    concentric hypertrophy. Systolic function was normal. The    estimated ejection fraction was in the range of 60% to 65%. Wall    motion was normal; there were no regional wall motion    abnormalities. Features are consistent with a pseudonormal left    ventricular filling pattern, with concomitant abnormal relaxation    and increased filling pressure (grade 2 diastolic dysfunction).    Doppler parameters are consistent with elevated ventricular    end-diastolic filling pressure.  - Aortic valve: There was no regurgitation.  - Mitral valve: There was mild regurgitation.  - Left atrium: The atrium was moderately dilated.  - Right ventricle: The cavity size was mildly dilated. Wall    thickness was normal. Systolic function was normal.  - Right atrium: The atrium was normal in size.  - Tricuspid valve: There was mild regurgitation.  - Pulmonic valve: There was mild regurgitation.  - Pulmonary arteries: Systolic pressure was within the normal    range. PA peak pressure: 35 mm Hg (S).  - Inferior vena cava: The vessel was normal in size.  - Pericardium, extracardiac: There was no pericardial effusion.   Coronary CTA 09/14/2018 IMPRESSION: 1. Coronary calcium  score of 26.4. This was 28th percentile for age and sex  matched control.   2. Normal coronary origin with right dominance.   3. Minimal plaque in ostial LAD and OM1.   Annabella Scarce, MD  Assessment & Plan   1. Essential hypertension-BP today 138/76  Reports she  is doing well with reduced amlodipine .  Ran out of Benicar  2 weeks ago and has been monitoring his blood pressure. Maintain blood pressure log Continue  amlodipine , metoprolol , spiro Continue heart healthy low-sodium diet  Grade 2 diastolic dysfunction/bilateral lower extremity edema-euvolemic today..  No increased DOE or activity intolerance.  Weight today 234 pounds.   Continue furosemide  Continue Daily weights Continue lower extremity support stockings Plan for repeat echocardiogram when clinically indicated.  Chest pain-denies anginal and exertional chest discomfort.  Reassuring coronary CTA 09/14/2018 which showed calcium  score of 26.4 and minimal plaque in his ostial LAD and OM1.  Continue amlodipine , aspirin Maintain physical activity  Hyperlipidemia-LDL 60 11/08/22.  Statin intolerant.  11/17/2023: Cholesterol, Total 168; HDL 42; LDL Chol Calc (NIH) 104; Triglycerides 121 Continue omega-3 fatty acids Reviewed importance of high-fiber diet  Morbid obesity-weight today 234 pounds.  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 Continue weight loss Increase physical activity as tolerated Heart healthy low-sodium diet  Disposition: Follow-up with Dr. Kate or me in 6 months.  Josefa HERO. Journe Hallmark NP-C    02/21/2024, 12:26 PM [facility] [address] Office [phone] Fax [phone]  Notice: This dictation was prepared with Dragon dictation along with smaller phrase technology. Any transcriptional errors that result from this process are unintentional and may not be corrected upon review.  I spent 14 minutes examining this patient, reviewing medications, and using patient centered shared  decision making involving her cardiac care.  I spent  20 minutes reviewing there past medical history,  medications, and prior cardiac tests.

## 2024-02-21 NOTE — Patient Instructions (Signed)
 Medication Instructions:  NO CHANGES   Lab Work: NONE   Testing/Procedures: NONE  Follow-Up: At [facility], you and your health needs are our priority.  As part of our continuing mission to provide you with exceptional heart care, our providers are all part of one team.  This team includes your primary Cardiologist (physician) and Advanced Practice Providers or APPs (Physician Assistants and Nurse Practitioners) who all work together to provide you with the care you need, when you need it.  Your next appointment:   6 MONTHS  Provider:   Lonni LITTIE Nanas, MD OR Josefa Beauvais, NP     Other Instructions PLEASE CONTINUE CURRENT WEIGHT LOSS.

## 2024-03-15 ENCOUNTER — Ambulatory Visit: Admitting: Cardiology

## 2024-04-15 ENCOUNTER — Ambulatory Visit: Admitting: General Practice

## 2024-05-06 DIAGNOSIS — M25562 Pain in left knee: Secondary | ICD-10-CM | POA: Diagnosis not present

## 2024-05-06 DIAGNOSIS — M25561 Pain in right knee: Secondary | ICD-10-CM | POA: Diagnosis not present

## 2024-05-06 DIAGNOSIS — M25462 Effusion, left knee: Secondary | ICD-10-CM | POA: Diagnosis not present

## 2024-05-07 ENCOUNTER — Other Ambulatory Visit: Payer: Self-pay | Admitting: Family Medicine

## 2024-05-07 DIAGNOSIS — M25561 Pain in right knee: Secondary | ICD-10-CM | POA: Diagnosis not present

## 2024-05-07 DIAGNOSIS — M25562 Pain in left knee: Secondary | ICD-10-CM

## 2024-05-08 ENCOUNTER — Ambulatory Visit
Admission: RE | Admit: 2024-05-08 | Discharge: 2024-05-08 | Disposition: A | Discharge status: Home / Self Care | Source: Ambulatory Visit | Attending: Family Medicine | Admitting: Family Medicine

## 2024-05-08 DIAGNOSIS — M25562 Pain in left knee: Secondary | ICD-10-CM

## 2024-05-08 DIAGNOSIS — Z96652 Presence of left artificial knee joint: Secondary | ICD-10-CM | POA: Diagnosis not present

## 2024-05-09 ENCOUNTER — Other Ambulatory Visit (HOSPITAL_COMMUNITY): Payer: Self-pay | Admitting: Orthopedic Surgery

## 2024-05-09 DIAGNOSIS — M25562 Pain in left knee: Secondary | ICD-10-CM | POA: Diagnosis not present

## 2024-05-09 DIAGNOSIS — Z96652 Presence of left artificial knee joint: Secondary | ICD-10-CM

## 2024-05-13 DIAGNOSIS — H40053 Ocular hypertension, bilateral: Secondary | ICD-10-CM | POA: Diagnosis not present

## 2024-05-14 ENCOUNTER — Encounter (HOSPITAL_COMMUNITY)
Admission: RE | Admit: 2024-05-14 | Discharge: 2024-05-14 | Disposition: A | Discharge status: Home / Self Care | Source: Ambulatory Visit | Attending: Orthopedic Surgery | Admitting: Orthopedic Surgery

## 2024-05-14 DIAGNOSIS — Z96653 Presence of artificial knee joint, bilateral: Secondary | ICD-10-CM | POA: Diagnosis not present

## 2024-05-14 DIAGNOSIS — Z96652 Presence of left artificial knee joint: Secondary | ICD-10-CM | POA: Insufficient documentation

## 2024-05-14 MED ORDER — TECHNETIUM TC 99M MEDRONATE IV KIT
20.0000 | PACK | Freq: Once | INTRAVENOUS | Status: AC | PRN
Start: 2024-05-14 — End: 2024-05-14
  Administered 2024-05-14: 20.9 via INTRAVENOUS

## 2024-05-24 DIAGNOSIS — M25562 Pain in left knee: Secondary | ICD-10-CM | POA: Diagnosis not present

## 2024-05-28 DIAGNOSIS — M7632 Iliotibial band syndrome, left leg: Secondary | ICD-10-CM | POA: Diagnosis not present

## 2024-05-28 DIAGNOSIS — R2689 Other abnormalities of gait and mobility: Secondary | ICD-10-CM | POA: Diagnosis not present

## 2024-05-28 DIAGNOSIS — M25562 Pain in left knee: Secondary | ICD-10-CM | POA: Diagnosis not present

## 2024-06-07 DIAGNOSIS — R2689 Other abnormalities of gait and mobility: Secondary | ICD-10-CM | POA: Diagnosis not present

## 2024-06-07 DIAGNOSIS — M7632 Iliotibial band syndrome, left leg: Secondary | ICD-10-CM | POA: Diagnosis not present

## 2024-06-07 DIAGNOSIS — M25562 Pain in left knee: Secondary | ICD-10-CM | POA: Diagnosis not present

## 2024-06-11 DIAGNOSIS — M25562 Pain in left knee: Secondary | ICD-10-CM | POA: Diagnosis not present

## 2024-06-11 DIAGNOSIS — M7632 Iliotibial band syndrome, left leg: Secondary | ICD-10-CM | POA: Diagnosis not present

## 2024-06-11 DIAGNOSIS — R2689 Other abnormalities of gait and mobility: Secondary | ICD-10-CM | POA: Diagnosis not present

## 2024-06-13 DIAGNOSIS — M25562 Pain in left knee: Secondary | ICD-10-CM | POA: Diagnosis not present

## 2024-06-13 DIAGNOSIS — R2689 Other abnormalities of gait and mobility: Secondary | ICD-10-CM | POA: Diagnosis not present

## 2024-06-13 DIAGNOSIS — M7632 Iliotibial band syndrome, left leg: Secondary | ICD-10-CM | POA: Diagnosis not present

## 2024-06-17 DIAGNOSIS — R2689 Other abnormalities of gait and mobility: Secondary | ICD-10-CM | POA: Diagnosis not present

## 2024-06-17 DIAGNOSIS — M25562 Pain in left knee: Secondary | ICD-10-CM | POA: Diagnosis not present

## 2024-06-17 DIAGNOSIS — M7632 Iliotibial band syndrome, left leg: Secondary | ICD-10-CM | POA: Diagnosis not present

## 2024-06-18 DIAGNOSIS — M25562 Pain in left knee: Secondary | ICD-10-CM | POA: Diagnosis not present

## 2024-06-18 DIAGNOSIS — M7632 Iliotibial band syndrome, left leg: Secondary | ICD-10-CM | POA: Diagnosis not present

## 2024-06-18 DIAGNOSIS — R2689 Other abnormalities of gait and mobility: Secondary | ICD-10-CM | POA: Diagnosis not present

## 2024-06-18 DIAGNOSIS — M7631 Iliotibial band syndrome, right leg: Secondary | ICD-10-CM | POA: Diagnosis not present

## 2024-06-18 DIAGNOSIS — M25561 Pain in right knee: Secondary | ICD-10-CM | POA: Diagnosis not present

## 2024-06-20 DIAGNOSIS — M7632 Iliotibial band syndrome, left leg: Secondary | ICD-10-CM | POA: Diagnosis not present

## 2024-06-20 DIAGNOSIS — M25562 Pain in left knee: Secondary | ICD-10-CM | POA: Diagnosis not present

## 2024-06-20 DIAGNOSIS — R2689 Other abnormalities of gait and mobility: Secondary | ICD-10-CM | POA: Diagnosis not present

## 2024-06-25 DIAGNOSIS — R2689 Other abnormalities of gait and mobility: Secondary | ICD-10-CM | POA: Diagnosis not present

## 2024-06-25 DIAGNOSIS — M25562 Pain in left knee: Secondary | ICD-10-CM | POA: Diagnosis not present

## 2024-06-25 DIAGNOSIS — M7632 Iliotibial band syndrome, left leg: Secondary | ICD-10-CM | POA: Diagnosis not present

## 2024-06-27 DIAGNOSIS — M7632 Iliotibial band syndrome, left leg: Secondary | ICD-10-CM | POA: Diagnosis not present

## 2024-06-27 DIAGNOSIS — R2689 Other abnormalities of gait and mobility: Secondary | ICD-10-CM | POA: Diagnosis not present

## 2024-06-27 DIAGNOSIS — M25562 Pain in left knee: Secondary | ICD-10-CM | POA: Diagnosis not present

## 2024-07-01 DIAGNOSIS — R2689 Other abnormalities of gait and mobility: Secondary | ICD-10-CM | POA: Diagnosis not present

## 2024-07-01 DIAGNOSIS — M25561 Pain in right knee: Secondary | ICD-10-CM | POA: Diagnosis not present

## 2024-07-01 DIAGNOSIS — M25562 Pain in left knee: Secondary | ICD-10-CM | POA: Diagnosis not present

## 2024-07-01 DIAGNOSIS — M7631 Iliotibial band syndrome, right leg: Secondary | ICD-10-CM | POA: Diagnosis not present

## 2024-07-01 DIAGNOSIS — M7632 Iliotibial band syndrome, left leg: Secondary | ICD-10-CM | POA: Diagnosis not present

## 2024-07-08 DIAGNOSIS — M7632 Iliotibial band syndrome, left leg: Secondary | ICD-10-CM | POA: Diagnosis not present

## 2024-07-08 DIAGNOSIS — M7631 Iliotibial band syndrome, right leg: Secondary | ICD-10-CM | POA: Diagnosis not present

## 2024-07-08 DIAGNOSIS — R2689 Other abnormalities of gait and mobility: Secondary | ICD-10-CM | POA: Diagnosis not present

## 2024-07-08 DIAGNOSIS — M25562 Pain in left knee: Secondary | ICD-10-CM | POA: Diagnosis not present

## 2024-07-08 DIAGNOSIS — M25561 Pain in right knee: Secondary | ICD-10-CM | POA: Diagnosis not present

## 2024-07-15 DIAGNOSIS — H40053 Ocular hypertension, bilateral: Secondary | ICD-10-CM | POA: Diagnosis not present

## 2024-07-16 ENCOUNTER — Ambulatory Visit: Payer: Self-pay

## 2024-07-16 DIAGNOSIS — M7631 Iliotibial band syndrome, right leg: Secondary | ICD-10-CM | POA: Diagnosis not present

## 2024-07-16 DIAGNOSIS — M25561 Pain in right knee: Secondary | ICD-10-CM | POA: Diagnosis not present

## 2024-07-16 DIAGNOSIS — R2689 Other abnormalities of gait and mobility: Secondary | ICD-10-CM | POA: Diagnosis not present

## 2024-07-16 DIAGNOSIS — M7632 Iliotibial band syndrome, left leg: Secondary | ICD-10-CM | POA: Diagnosis not present

## 2024-07-16 DIAGNOSIS — M25562 Pain in left knee: Secondary | ICD-10-CM | POA: Diagnosis not present

## 2024-07-16 NOTE — Telephone Encounter (Signed)
 With Family Medicine Lalla MARLA Burkes, MD) 09/16/2024 at 8:20 AM

## 2024-07-16 NOTE — Telephone Encounter (Signed)
 FYI Only or Action Required?: FYI only for provider: proceeding to urgent care .  Patient was last seen in primary care on 09/14/2023 by Jimmy Charlie FERNS, MD.  Called Nurse Triage reporting Cough.  Symptoms began several days ago.  Interventions attempted: OTC medications: Robitussin, Tylenol  and Rest, hydration, or home remedies.  Symptoms are: unchanged.  Triage Disposition: See Physician Within 24 Hours (overriding Home Care)  Patient/caregiver understands and will follow disposition?: Yes    Copied from CRM [id number]. Topic: Clinical - Red Word Triage >> Jul 16, 2024  2:48 PM Eva FALCON wrote: Red Word that prompted transfer to Nurse Triage: sore throat, swollen glands, congestion, severe cough. Reason for Disposition  Cough  Answer Assessment - Initial Assessment Questions Additional info: Requesting medication for cough. Advised evaluation needed, no appointments are available until next week, proceed to urgent care.     1. ONSET: When did the cough begin?      Saturday 2. SEVERITY: How bad is the cough today?      strong 3. SPUTUM: Describe the color of your sputum (e.g., none, dry cough; clear, white, yellow, green)     clear 4. HEMOPTYSIS: Are you coughing up any blood? If Yes, ask: How much? (e.g., flecks, streaks, tablespoons, etc.)     denies 5. DIFFICULTY BREATHING: Are you having difficulty breathing? If Yes, ask: How bad is it? (e.g., mild, moderate, severe)      Denies  6. FEVER: Do you have a fever? If Yes, ask: What is your temperature, how was it measured, and when did it start?     denies 7. CARDIAC HISTORY: Do you have any history of heart disease? (e.g., heart attack, congestive heart failure)       8. LUNG HISTORY: Do you have any history of lung disease?  (e.g., pulmonary embolus, asthma, emphysema)      9. PE RISK FACTORS: Do you have a history of blood clots? (or: recent major surgery, recent prolonged travel, bedridden)       10. OTHER SYMPTOMS: Do you have any other symptoms? (e.g., runny nose, wheezing, chest pain)       Congestion, sore throat 11. PREGNANCY: Is there any chance you are pregnant? When was your last menstrual period?        12. TRAVEL: Have you traveled out of the country in the last month? (e.g., travel history, exposures)  Protocols used: Cough - Acute Productive-A-AH

## 2024-07-19 ENCOUNTER — Telehealth (HOSPITAL_COMMUNITY): Payer: Self-pay

## 2024-07-19 ENCOUNTER — Encounter (HOSPITAL_COMMUNITY): Payer: Self-pay

## 2024-07-19 ENCOUNTER — Ambulatory Visit (INDEPENDENT_AMBULATORY_CARE_PROVIDER_SITE_OTHER)

## 2024-07-19 ENCOUNTER — Ambulatory Visit (HOSPITAL_COMMUNITY)
Admission: EM | Admit: 2024-07-19 | Discharge: 2024-07-19 | Disposition: A | Discharge status: Home / Self Care | Attending: Emergency Medicine | Admitting: Emergency Medicine

## 2024-07-19 DIAGNOSIS — R051 Acute cough: Secondary | ICD-10-CM

## 2024-07-19 DIAGNOSIS — J069 Acute upper respiratory infection, unspecified: Secondary | ICD-10-CM

## 2024-07-19 DIAGNOSIS — R059 Cough, unspecified: Secondary | ICD-10-CM | POA: Diagnosis not present

## 2024-07-19 DIAGNOSIS — H65191 Other acute nonsuppurative otitis media, right ear: Secondary | ICD-10-CM

## 2024-07-19 MED ORDER — AMOXICILLIN-POT CLAVULANATE 875-125 MG PO TABS: 1.0000 | ORAL_TABLET | Freq: Two times a day (BID) | ORAL | 0 refills | Status: AC

## 2024-07-19 MED ORDER — BENZONATATE 100 MG PO CAPS: 100.0000 mg | ORAL_CAPSULE | Freq: Three times a day (TID) | ORAL | 0 refills | Status: DC | PRN

## 2024-07-19 MED ORDER — PROMETHAZINE-DM 6.25-15 MG/5ML PO SYRP: 5.0000 mL | ORAL_SOLUTION | Freq: Every evening | ORAL | 0 refills | Status: AC | PRN

## 2024-07-19 MED ORDER — AMOXICILLIN-POT CLAVULANATE 875-125 MG PO TABS: 1.0000 | ORAL_TABLET | Freq: Two times a day (BID) | ORAL | 0 refills | Status: DC

## 2024-07-19 MED ORDER — GUAIFENESIN ER 600 MG PO TB12: 1200.0000 mg | ORAL_TABLET | Freq: Two times a day (BID) | ORAL | 0 refills | Status: AC

## 2024-07-19 NOTE — Discharge Instructions (Addendum)
 Xray does not show any pneumonia or other infection  The tessalon  cough pills can be taken 3 times daily, 1-2 pills at a time. The promethazine  DM cough syrup can be used once before bed. Be careful as this might make you drowsy!  I recommend guaifenesin (mucinex) taken with lots of fluids to help thin congestion.  Try a nasal spray such as flonase for ear pressure/pain  If your ear symptoms worsen over the next 2-3 days, you can fill the paper prescription for Augmentin . If you take this medicine, always eat first!

## 2024-07-19 NOTE — Telephone Encounter (Signed)
 Informed by front desk staff: patient was seen today stated they didn't get hand written prescription or AVS [id number].   Returned Patient call. Patient states he does not know where the paperwork is. States he went straight out of the door but now he does not have it. States they checked the car and does not have it.   Placed the Patient on hold and searched the areas of the office they were in. Did not find the printed prescription for Amoxicillin -Pot Clavulanate 875-125 MG 1 tablet Oral Every 12 hours. Informed the Patient that I would ask the provider if this could be sent in electronically to a preferred pharmacy.   Patient prefers [facility] [place]. Message sent to provider.

## 2024-07-19 NOTE — ED Provider Notes (Signed)
 [facility]    CSN: [id number] Arrival date & time: 07/19/24  0847     History   Chief Complaint Chief Complaint  Patient presents with   Cough    HPI Anthony [name]. is a 73 y.o. male.  Here with 4 day history of bilateral ear pain, nasal congestion, and cough.  Cough is minimally productive, causing chest soreness generalized. Reports a little short of breath. Not wheezing. No chest pain/pressure at rest Reports tactile fever, not measured. Having chills.  No abdominal pain, nausea/vomiting. Denies leg swelling  Possible sick contacts, recent travel to [place] [employer]   Tylenol , robitussin, mucinex   Hx CHF, HTN Followed by cardiology. Reports healthy weight loss due to better diet  Past Medical History:  Diagnosis Date   Benign neoplasm of colon    Benign neoplasm of skin of upper limb, including shoulder    Cancer of skin of external cheek    Carpal tunnel syndrome    Chest pain    a. normal cors by cath in 2015   Chronic diastolic HF (heart failure) (HCC)    Diaphragmatic hernia without mention of obstruction or gangrene    Dyslipidemia    Elevated prostate specific antigen (PSA)    GERD (gastroesophageal reflux disease)    Herpes zoster without mention of complication    History of cancer of external ear    History of hiatal hernia    Hypertension    Hypertrophy of prostate with urinary obstruction and other lower urinary tract symptoms (LUTS)    Impotence of organic origin    Lumbago    Obesity, unspecified    Osteoarthrosis, unspecified whether generalized or localized, lower leg    Other and unspecified hyperlipidemia    Other seborrheic keratosis    Pneumonia 2004   Stenosis, cervical spine    Unspecified tinnitus     Patient Active Problem List   Diagnosis Date Noted   Acute non-recurrent frontal sinusitis 09/14/2023   S/P repair of ventral hernia 04/28/2023   Chronic diastolic HF (heart failure) (HCC) 09/12/2022   Preventative  health care 09/12/2022   Upper airway cough syndrome 08/05/2022   DOE (dyspnea on exertion) 08/05/2022   Statin myopathy 01/21/2019   Esophageal stenosis 09/23/2014   DJD (degenerative joint disease) 09/04/2013   Arthritis, degenerative 09/04/2013   Mixed hyperlipidemia 07/25/2013   Glaucoma 06/11/2013   BPH (benign prostatic hyperplasia)    GERD (gastroesophageal reflux disease)    Morbid obesity (HCC) 03/06/2013   Chronic total retinal detachment 12/25/2012   Essential hypertension    Cataract of left eye 09/20/2012   Cystoid macular degeneration 09/20/2012    Past Surgical History:  Procedure Laterality Date   ANTERIOR CERVICAL DECOMP/DISCECTOMY FUSION N/A 02/19/2018   Procedure: ANTERIOR CERVICAL DECOMPRESSION/DISCECTOMY FUSION - CERVICAL FOUR-CERVICAL FIVE - CERVICAL FIVE-CERVICAL SIX;  Surgeon: Louis Shove, MD;  Location: [facility] OR;  Service: Neurosurgery;  Laterality: N/A;   CARDIOVASCULAR STRESS TEST  06/15/2012   Non-diagnostic for ischemia. No lexiscan  EKG changes.   CHOLECYSTECTOMY  08/22/1990   COLONOSCOPY  2022   EYE SURGERY     eyeband  08/22/2004   removal of right eyeband   HAND SURGERY  07/2022   right pointer knuckle replaced   INGUINAL HERNIA REPAIR  08/22/1982   left   INTRAOCULAR LENS REMOVAL  08/22/2006   right   LEFT HEART CATHETERIZATION WITH CORONARY ANGIOGRAM N/A 09/05/2013   Procedure: LEFT HEART CATHETERIZATION WITH CORONARY ANGIOGRAM;  Surgeon: Debby LABOR  Burnard, MD;  Location: [facility] CATH LAB;  Service: Cardiovascular;  Laterality: N/A;   LOWER VENOUS EXTREMITY DOPPLER  01/26/2011   No evidence of thrombus or thrombophlebitis.   RETINAL DETACHMENT SURGERY  08/23/1999   right   TOTAL HIP ARTHROPLASTY  08/22/2010   bilateral   TOTAL KNEE ARTHROPLASTY  2011, 2012   bilateral   TRANSTHORACIC ECHOCARDIOGRAM  10/05/2009   EF >55%, normal LV size, systolic, and diastolic function.   VENTRAL HERNIA REPAIR N/A 04/28/2023   Procedure: LAPAROSCOPIC VENTRAL HERNIA  REPAIR WITH MESH;  Surgeon: Curvin Deward MOULD, MD;  Location[facility];  Service: General;  Laterality: N/A;       Home Medications    Prior to Admission medications   Medication Sig Start Date End Date Taking? Authorizing Provider  amLODipine  (NORVASC ) 5 MG tablet Take 0.5 tablets (2.5 mg total) by mouth daily. 02/21/24  Yes Emelia Josefa HERO, NP  amoxicillin -clavulanate (AUGMENTIN ) 875-125 MG tablet Take 1 tablet by mouth every 12 (twelve) hours for 7 days. 07/19/24 07/26/24 Yes Howard Bunte, PA-C  aspirin EC 81 MG tablet Take 81 mg by mouth daily. Swallow whole.   Yes [provider]  benzonatate  (TESSALON ) 100 MG capsule Take 1 capsule (100 mg total) by mouth 3 (three) times daily as needed for cough. 07/19/24  Yes Eaven Schwager, [place], PA-C  brimonidine  (ALPHAGAN  P) 0.1 % SOLN Place 1 drop into the right eye every 8 (eight) hours.   Yes [provider]  carvedilol  (COREG ) 6.25 MG tablet Take 1 tablet (6.25 mg total) by mouth 2 (two) times daily. 02/21/24  Yes Emelia Josefa HERO, NP  dorzolamide -timolol  (COSOPT ) 22.3-6.8 MG/ML ophthalmic solution Place 2 drops into the right eye 2 (two) times daily.    Yes [provider]  famotidine  (PEPCID ) 40 MG tablet Take 1 tablet (40 mg total) by mouth daily. 11/15/23  Yes Jimmy Ade I, MD  finasteride  (PROSCAR ) 5 MG tablet TAKE 1 TABLET BY MOUTH EVERY DAY IN THE MORNING 11/22/23  Yes Jimmy Ade FERNS, MD  furosemide  (LASIX ) 20 MG tablet Take 1 tablet (20 mg total) by mouth daily. 02/21/24  Yes Cleaver, Josefa HERO, NP  guaiFENesin (MUCINEX) 600 MG 12 hr tablet Take 2 tablets (1,200 mg total) by mouth 2 (two) times daily. 07/19/24  Yes Deitra Craine, PA-C  Lactobacillus-Inulin (CULTURELLE ADULT ULT BALANCE PO) Take 1 capsule by mouth daily.   Yes [provider]  nitroGLYCERIN  (NITROSTAT ) 0.4 MG SL tablet Place 1 tablet (0.4 mg total) under the tongue every 5 (five) minutes as needed for chest pain. 02/21/24  Yes Emelia Josefa HERO, NP   olmesartan  (BENICAR ) 40 MG tablet TAKE 1 TABLET BY MOUTH EVERY DAY 08/30/23  Yes Darlean Ozell NOVAK, MD  Omega-3 Fatty Acids (FISH OIL) 1000 MG CAPS Take 1,000 mg by mouth daily.   Yes [provider]  omeprazole  (PRILOSEC) 40 MG capsule Take 1 capsule (40 mg total) by mouth daily. 02/21/24  Yes Cleaver, Josefa HERO, NP  Polyvinyl Alcohol-Povidone (REFRESH OP) Place 1 drop into the right eye daily as needed (dry eyes/irritation).   Yes [provider]  promethazine -dextromethorphan (PROMETHAZINE -DM) 6.25-15 MG/5ML syrup Take 5 mLs by mouth at bedtime as needed for cough. 07/19/24  Yes Yuliana Vandrunen, [place], PA-C  spironolactone  (ALDACTONE ) 25 MG tablet TAKE 1 TABLET (25 MG TOTAL) BY MOUTH DAILY. 11/27/23  Yes Jimmy Ade FERNS, MD    Family History Family History  Problem Relation Age of Onset   Diabetes Mother  Arrhythmia Mother    Hypertension Mother    Dementia Father    Pulmonary embolism Father    Hypertension Father    Cancer Sister        Liver cancer - Histocytic lymphoma   Cancer Sister 53       Skin cancer   Hypertension Sister 34   Diabetes Sister    Stroke Brother    Diabetes Brother    Hypertension Brother    Cancer Daughter    Cancer Maternal Grandmother        Skin cancer   Heart attack Maternal Grandfather    Cancer Paternal Grandfather    Hypertension Paternal Grandfather     Social History Social History   Tobacco Use   Smoking status: Never    Passive exposure: Past   Smokeless tobacco: Never  Vaping Use   Vaping status: Never Used  Substance Use Topics   Alcohol use: No   Drug use: No     Allergies   Red yeast rice [cholestin], Statins, Codeine, and Latex   Review of Systems Review of Systems  Respiratory:  Positive for cough.    As per HPI  Physical Exam Triage Vital Signs ED Triage Vitals  Encounter Vitals Group     BP 07/19/24 0916 (!) 168/78     Girls Systolic BP Percentile --      Girls Diastolic BP Percentile --      Boys  Systolic BP Percentile --      Boys Diastolic BP Percentile --      Pulse Rate 07/19/24 0916 75     Resp 07/19/24 0916 18     Temp 07/19/24 0916 98.2 F (36.8 C)     Temp Source 07/19/24 0916 Oral     SpO2 07/19/24 0916 94 %     Weight 07/19/24 0915 237 lb (107.5 kg)     Height 07/19/24 0915 5' 8 (1.727 m)     Head Circumference --      Peak Flow --      Pain Score 07/19/24 0913 5     Pain Loc --      Pain Education --      Exclude from Growth Chart --    No data found.  Updated Vital Signs BP (!) 157/74 (BP Location: Right Arm)   Pulse 78   Temp 98.2 F (36.8 C) (Oral)   Resp 18   Ht 5' 8 (1.727 m)   Wt 237 lb (107.5 kg)   SpO2 94%   BMI 36.04 kg/m    Physical Exam Vitals and nursing note reviewed.  Constitutional:      General: He is not in acute distress.    Appearance: He is not ill-appearing or diaphoretic.     Comments: Appears younger than stated age  HENT:     Right Ear: Ear canal normal. Tympanic membrane is injected (mild). Tympanic membrane is not bulging.     Left Ear: Tympanic membrane and ear canal normal. Tympanic membrane is not injected, erythematous or bulging.     Nose: Congestion present. No rhinorrhea.     Mouth/Throat:     Mouth: Mucous membranes are moist.     Pharynx: Oropharynx is clear. No posterior oropharyngeal erythema.  Eyes:     Conjunctiva/sclera: Conjunctivae normal.  Cardiovascular:     Rate and Rhythm: Normal rate and regular rhythm.     Pulses: Normal pulses.     Heart sounds: Murmur heard.  Pulmonary:  Effort: Pulmonary effort is normal. No respiratory distress.     Breath sounds: Normal breath sounds. No wheezing, rhonchi or rales.  Musculoskeletal:     Cervical back: Normal range of motion. No rigidity or tenderness.     Right lower leg: No edema.     Left lower leg: No edema.  Lymphadenopathy:     Cervical: Cervical adenopathy present.     Comments: Submental bilateral. Minimally tender. Mobile and soft.    Skin:    General: Skin is warm and dry.  Neurological:     Mental Status: He is alert and oriented to person, place, and time.      UC Treatments / Results  Labs (all labs ordered are listed, but only abnormal results are displayed) Labs Reviewed - No data to display  EKG   Radiology DG Chest 2 View Result Date: 07/19/2024 CLINICAL DATA:  Cough for 3 days. EXAM: DG CHEST 2V COMPARISON:  06/02/2022.  CT, 09/14/2018. FINDINGS: Cardiac silhouette is normal in size. No mediastinal or hilar masses. No evidence of adenopathy. Mild linear opacities noted at the lung bases consistent with atelectasis/scarring. Prominent bronchovascular markings also noted mostly in the lower lungs. No evidence of pneumonia or pulmonary edema. No pleural effusion or pneumothorax. Skeletal structures are intact. IMPRESSION: No active cardiopulmonary disease. Electronically Signed   By: Alm Parkins M.D.   On: 07/19/2024 10:11    Procedures Procedures (including critical care time)  Medications Ordered in UC Medications - No data to display  Initial Impression / Assessment and Plan / UC Course  I have reviewed the triage vital signs and the nursing notes.  Pertinent labs & imaging results that were available during my care of the patient were reviewed by me and considered in my medical decision making (see chart for details).    Afebrile in clinic. Sating 94% room air Clear lungs With shortness of breath and chest discomfort with cough, chest xray obtained. Plain films without pneumonia. Atelectasis vs scarring. Images independently reviewed by me, agree with radiology interpretation. Discussed with patient. Supportive care for likely viral illness. Tessalon  perles, flonase, guaifenesin. Promethazine  DM only before bed with drowsy precautions.   Minimally injected right TM Delayed prescription for augmentin  if symptoms worsen in 2-3 days. Paper prescription given for BID x 7 days  Patient is  agreeable to plan, no questions at this time  Final Clinical Impressions(s) / UC Diagnoses   Final diagnoses:  Acute cough  Other non-recurrent acute nonsuppurative otitis media of right ear  Acute upper respiratory infection     Discharge Instructions      Xray does not show any pneumonia or other infection  The tessalon  cough pills can be taken 3 times daily, 1-2 pills at a time. The promethazine  DM cough syrup can be used once before bed. Be careful as this might make you drowsy!  I recommend guaifenesin (mucinex) taken with lots of fluids to help thin congestion.  Try a nasal spray such as flonase for ear pressure/pain  If your ear symptoms worsen over the next 2-3 days, you can fill the paper prescription for Augmentin . If you take this medicine, always eat first!     ED Prescriptions     Medication Sig Dispense Auth. Provider   benzonatate  (TESSALON ) 100 MG capsule Take 1 capsule (100 mg total) by mouth 3 (three) times daily as needed for cough. 30 capsule Saumya Hukill, PA-C   promethazine -dextromethorphan (PROMETHAZINE -DM) 6.25-15 MG/5ML syrup Take 5 mLs by mouth at  bedtime as needed for cough. 118 mL Dermot Gremillion, PA-C   amoxicillin -clavulanate (AUGMENTIN ) 875-125 MG tablet Take 1 tablet by mouth every 12 (twelve) hours for 7 days. 14 tablet Sherise Geerdes, PA-C   guaiFENesin (MUCINEX) 600 MG 12 hr tablet Take 2 tablets (1,200 mg total) by mouth 2 (two) times daily. 30 tablet Tirsa Gail, [place], PA-C      PDMP not reviewed this encounter.   Murry Diaz, [place], PA-C 07/19/24 1027

## 2024-07-19 NOTE — ED Triage Notes (Signed)
 Patient having cough, ear pain, congestion, chest soreness after cough, and fever for the last 3 days. No known sick exposure.   Patient tried robitussin, tylenol , and Mucinex DM with no relief.

## 2024-07-19 NOTE — Telephone Encounter (Signed)
 Patient was provided paper prescription for delayed abx if needed. He left the clinic with this, but lost it somewhere between the front door and the pharmacy. I have sent electronically to pharmacy with instructions for delayed fill -- on or after 07/21/2024

## 2024-07-24 ENCOUNTER — Ambulatory Visit: Payer: Self-pay | Admitting: Internal Medicine

## 2024-07-24 ENCOUNTER — Ambulatory Visit: Admitting: Student in an Organized Health Care Education/Training Program

## 2024-07-24 ENCOUNTER — Ambulatory Visit
Admission: RE | Admit: 2024-07-24 | Discharge: 2024-07-24 | Disposition: A | Attending: Student in an Organized Health Care Education/Training Program | Admitting: Student in an Organized Health Care Education/Training Program

## 2024-07-24 ENCOUNTER — Encounter: Payer: Self-pay | Admitting: Student in an Organized Health Care Education/Training Program

## 2024-07-24 ENCOUNTER — Ambulatory Visit
Admission: RE | Admit: 2024-07-24 | Discharge: 2024-07-24 | Disposition: A | Source: Ambulatory Visit | Attending: Student in an Organized Health Care Education/Training Program

## 2024-07-24 VITALS — BP 138/68 | HR 76 | Temp 98.4°F | Ht 68.0 in | Wt 244.4 lb

## 2024-07-24 DIAGNOSIS — M7632 Iliotibial band syndrome, left leg: Secondary | ICD-10-CM | POA: Diagnosis not present

## 2024-07-24 DIAGNOSIS — M25561 Pain in right knee: Secondary | ICD-10-CM | POA: Diagnosis not present

## 2024-07-24 DIAGNOSIS — J069 Acute upper respiratory infection, unspecified: Secondary | ICD-10-CM

## 2024-07-24 DIAGNOSIS — M25562 Pain in left knee: Secondary | ICD-10-CM | POA: Diagnosis not present

## 2024-07-24 DIAGNOSIS — R2689 Other abnormalities of gait and mobility: Secondary | ICD-10-CM | POA: Diagnosis not present

## 2024-07-24 DIAGNOSIS — M7631 Iliotibial band syndrome, right leg: Secondary | ICD-10-CM | POA: Diagnosis not present

## 2024-07-24 DIAGNOSIS — R059 Cough, unspecified: Secondary | ICD-10-CM | POA: Diagnosis not present

## 2024-07-24 MED ORDER — AZITHROMYCIN 250 MG PO TABS: ORAL_TABLET | ORAL | 0 refills | Status: AC

## 2024-07-24 NOTE — Telephone Encounter (Signed)
 Answer Assessment - Initial Assessment Questions E2C2 Pulmonary Triage - Initial Assessment Questions Chief Complaint (e.g., cough, sob, wheezing, fever, chills, sweat or additional symptoms) *Go to specific symptom protocol after initial questions. SOB and productive cough with orange to brown mucous  How long have symptoms been present? X 7 days  Have you tested for COVID or Flu? Note: If not, ask patient if a home test can be taken. If so, instruct patient to call back for positive results. No  MEDICINES:   Have you used any OTC meds to help with symptoms? Yes If yes, ask What medications? Urgent care prescribed Augmentin , benzonatate , guaifenesin  and taking Tylenol .  Have you used your inhalers/maintenance medication? No If yes, What medications? N/A  If inhaler, ask How many puffs and how often? Note: Review instructions on medication in the chart. N/A.  OXYGEN: Do you wear supplemental oxygen? No If yes, How many liters are you supposed to use? N/A  Do you monitor your oxygen levels? No If yes, What is your reading (oxygen level) today? N/A  What is your usual oxygen saturation reading?  (Note: Pulmonary O2 sats should be 90% or greater) N/A      1. RESPIRATORY STATUS: Describe your breathing? (e.g., wheezing, shortness of breath, unable to speak, severe coughing)      SOB, cough. Denies wheezing. Productive cough with orange to brown mucous and SOB at night especially having to elevate HOB and after coughing spells.  2. ONSET: When did this breathing problem begin?      Thursday.  3. PATTERN Does the difficult breathing come and go, or has it been constant since it started?      Comes and goes, more with coughing fit and activity.  4. SEVERITY: How bad is your breathing? (e.g., mild, moderate, severe)      Mild.  5. RECURRENT SYMPTOM: Have you had difficulty breathing before? If Yes, ask: When was the last time? and What  happened that time?      Wife states he had pneumonia back in 2003 but not since then.  6. CARDIAC HISTORY: Do you have any history of heart disease? (e.g., heart attack, angina, bypass surgery, angioplasty)      CHF.  7. LUNG HISTORY: Do you have any history of lung disease?  (e.g., pulmonary embolus, asthma, emphysema)     No.  8. CAUSE: What do you think is causing the breathing problem?      Urgent care told her it was a URI.  9. OTHER SYMPTOMS: Do you have any other symptoms? (e.g., chest pain, cough, dizziness, fever, runny nose)     Head congestion. Denies chest pain, fever, runny nose, swelling or edema.  10. O2 SATURATION MONITOR:  Do you use an oxygen saturation monitor (pulse oximeter) at home? If Yes, ask: What is your reading (oxygen level) today? What is your usual oxygen saturation reading? (e.g., 95%)       Doesn't have a monitor to check.  11. PREGNANCY: Is there any chance you are pregnant? When was your last menstrual period?       N/A.  12. TRAVEL: Have you traveled out of the country in the last month? (e.g., travel history, exposures)       No travel.  Protocols used: Breathing Difficulty-A-AH                Copied from CRM [id number]. Topic: Clinical - Red Word Triage >> Jul 24, 2024  8:03 AM Russell PARAS wrote: Red  Word that prompted transfer to Nurse Triage:   Pt was seen at [facility] on Friday Diag. with URI, prescribed antibiotics Ears were slightly pink Still having severe cough and congestion No wheezing  Pt of [place] Reason for Disposition  [1] MILD difficulty breathing (e.g., minimal/no SOB at rest, SOB with walking, pulse < 100) AND [2] NEW-onset or WORSE than normal  Answer Assessment - Initial Assessment Questions E2C2 Pulmonary Triage - Initial Assessment Questions Chief Complaint (e.g., cough, sob, wheezing, fever, chills, sweat or additional symptoms) *Go to specific symptom protocol after initial questions. SOB and  productive cough  How long have symptoms been present? X 7 days  Have you tested for COVID or Flu? Note: If not, ask patient if a home test can be taken. If so, instruct patient to call back for positive results. No  MEDICINES:   Have you used any OTC meds to help with symptoms? Yes If yes, ask What medications? Urgent care prescribed Augmentin , benzonatate , guaifenesin  and taking Tylenol .  Have you used your inhalers/maintenance medication? No If yes, What medications? N/A  If inhaler, ask How many puffs and how often? Note: Review instructions on medication in the chart. N/A.  OXYGEN: Do you wear supplemental oxygen? No If yes, How many liters are you supposed to use? N/A  Do you monitor your oxygen levels? No If yes, What is your reading (oxygen level) today? N/A  What is your usual oxygen saturation reading?  (Note: Pulmonary O2 sats should be 90% or greater) N/A      1. RESPIRATORY STATUS: Describe your breathing? (e.g., wheezing, shortness of breath, unable to speak, severe coughing)      SOB, cough. Denies wheezing. Productive cough with orange to brown mucous and SOB at night especially having to elevate HOB and after coughing spells.  2. ONSET: When did this breathing problem begin?      Thursday.  3. PATTERN Does the difficult breathing come and go, or has it been constant since it started?      Comes and goes, more with coughing fit and activity.  4. SEVERITY: How bad is your breathing? (e.g., mild, moderate, severe)      Mild.  5. RECURRENT SYMPTOM: Have you had difficulty breathing before? If Yes, ask: When was the last time? and What happened that time?      Wife states he had pneumonia back in 2003 but not since then.  6. CARDIAC HISTORY: Do you have any history of heart disease? (e.g., heart attack, angina, bypass surgery, angioplasty)      CHF.  7. LUNG HISTORY: Do you have any history of lung disease?   (e.g., pulmonary embolus, asthma, emphysema)     No.  8. CAUSE: What do you think is causing the breathing problem?      Urgent care told her it was a URI.  9. OTHER SYMPTOMS: Do you have any other symptoms? (e.g., chest pain, cough, dizziness, fever, runny nose)     Head congestion. Denies chest pain, fever, runny nose, swelling or edema.  10. O2 SATURATION MONITOR:  Do you use an oxygen saturation monitor (pulse oximeter) at home? If Yes, ask: What is your reading (oxygen level) today? What is your usual oxygen saturation reading? (e.g., 95%)       Doesn't have a monitor to check.  11. PREGNANCY: Is there any chance you are pregnant? When was your last menstrual period?       N/A.  12. TRAVEL: Have you traveled out  of the country in the last month? (e.g., travel history, exposures)       No travel.  Protocols used: Breathing Difficulty-A-AH

## 2024-07-24 NOTE — Telephone Encounter (Signed)
 Patient seen in [email or web address]

## 2024-07-24 NOTE — Patient Instructions (Signed)
  VISIT SUMMARY: You visited us  today due to a worsening cough, mucus production, and shortness of breath. You initially thought these symptoms were due to allergies but later sought urgent care where you were prescribed medications. Your symptoms have persisted and worsened, leading to today's visit.  YOUR PLAN: -ACUTE UPPER RESPIRATORY INFECTION WITH ASSOCIATED SHORTNESS OF BREATH: An acute upper respiratory infection is a viral or bacterial infection affecting the nose, throat, and airways. Your symptoms include cough, mucus production, chills, and mild fevers. We suspect a viral illness but are also considering walking pneumonia. Continue taking Augmentin  as prescribed and we have added azithromycin  (Z-Pak) to your treatment. We have ordered a repeat chest x-ray to check for pneumonia. Please wear a mask during family gatherings to prevent spreading the infection. After you recover, we recommend getting a flu shot, RSV shot, and COVID booster.  INSTRUCTIONS: Please continue taking Augmentin  as prescribed and start the azithromycin  (Z-Pak) as directed. Wear a mask during family gatherings to prevent spreading the infection. We have ordered a repeat chest x-ray to rule out developing pneumonia. After you recover, please get a flu shot, RSV shot, and COVID booster. Follow up with Doctor Ward in three months.                      Contains text generated by Abridge.                                 Contains text generated by Abridge.

## 2024-07-24 NOTE — Progress Notes (Signed)
 Assessment & Plan:   Assessment & Plan  #Acute upper respiratory infection (URTI)  Acute upper respiratory infection with cough, mucus production, chills, and mild fevers. Symptoms worsened post-urgent care visit with persistent cough. Chest x-ray showed mild linear opacities consistent with atelectasis during urgent care visit, will repeat today to rule out any evolving pneumonia. Differential includes viral illness and possible atypical pneumonia. Viral illness suspected. COVID-19 testing not performed, unlikely to change management given out of the window for paxlovid.  - Continue Augmentin  as prescribed. - Added azithromycin (Z-Pak) to current regimen. - Ordered repeat chest x-ray to rule out developing pneumonia > personally reviewed and unchanged - No wheezing precludes need to prednisone  - Advised wearing a mask during family gatherings to prevent transmission. - Recommended flu shot, RSV shot, and COVID booster after recovery. - Scheduled follow-up with Doctor Darlean in three months. - DG Chest 2 View; Future - azithromycin (ZITHROMAX) 250 MG tablet; Take 2 tablets (500 mg) on  Day 1,  followed by 1 tablet (250 mg) once daily on Days 2 through 5.  Dispense: 6 each; Refill: 0   Return in about 3 months (around 10/22/2024).  Belva November, MD [facility]  I spent 33 minutes caring for this patient today, including preparing to see the patient, obtaining a medical history , reviewing a separately obtained history, performing a medically appropriate examination and/or evaluation, counseling and educating the patient/family/caregiver, ordering medications, tests, or procedures, documenting clinical information in the electronic health record, and independently interpreting results (not separately reported/billed) and communicating results to the patient/family/caregiver  End of visit medications:  Meds ordered this encounter  Medications   azithromycin (ZITHROMAX)  250 MG tablet    Sig: Take 2 tablets (500 mg) on  Day 1,  followed by 1 tablet (250 mg) once daily on Days 2 through 5.    Dispense:  6 each    Refill:  0     Current Outpatient Medications:    amLODipine  (NORVASC ) 5 MG tablet, Take 0.5 tablets (2.5 mg total) by mouth daily., Disp: 45 tablet, Rfl: 3   amoxicillin -clavulanate (AUGMENTIN ) 875-125 MG tablet, Take 1 tablet by mouth every 12 (twelve) hours for 7 days., Disp: 14 tablet, Rfl: 0   aspirin EC 81 MG tablet, Take 81 mg by mouth daily. Swallow whole., Disp: , Rfl:    azithromycin (ZITHROMAX) 250 MG tablet, Take 2 tablets (500 mg) on  Day 1,  followed by 1 tablet (250 mg) once daily on Days 2 through 5., Disp: 6 each, Rfl: 0   benzonatate  (TESSALON ) 100 MG capsule, Take 1 capsule (100 mg total) by mouth 3 (three) times daily as needed for cough., Disp: 30 capsule, Rfl: 0   brimonidine  (ALPHAGAN  P) 0.1 % SOLN, Place 1 drop into the right eye every 8 (eight) hours., Disp: , Rfl:    carvedilol  (COREG ) 6.25 MG tablet, Take 1 tablet (6.25 mg total) by mouth 2 (two) times daily., Disp: 180 tablet, Rfl: 3   dorzolamide -timolol  (COSOPT ) 22.3-6.8 MG/ML ophthalmic solution, Place 2 drops into the right eye 2 (two) times daily. , Disp: , Rfl:    famotidine  (PEPCID ) 40 MG tablet, Take 1 tablet (40 mg total) by mouth daily., Disp: 90 tablet, Rfl: 3   finasteride  (PROSCAR ) 5 MG tablet, TAKE 1 TABLET BY MOUTH EVERY DAY IN THE MORNING, Disp: 90 tablet, Rfl: 3   furosemide  (LASIX ) 20 MG tablet, Take 1 tablet (20 mg total) by mouth daily., Disp: 90 tablet,  Rfl: 3   guaiFENesin (MUCINEX) 600 MG 12 hr tablet, Take 2 tablets (1,200 mg total) by mouth 2 (two) times daily., Disp: 30 tablet, Rfl: 0   Lactobacillus-Inulin (CULTURELLE ADULT ULT BALANCE PO), Take 1 capsule by mouth daily., Disp: , Rfl:    nitroGLYCERIN  (NITROSTAT ) 0.4 MG SL tablet, Place 1 tablet (0.4 mg total) under the tongue every 5 (five) minutes as needed for chest pain., Disp: 30 tablet, Rfl:  11   olmesartan  (BENICAR ) 40 MG tablet, TAKE 1 TABLET BY MOUTH EVERY DAY, Disp: 90 tablet, Rfl: 0   Omega-3 Fatty Acids (FISH OIL) 1000 MG CAPS, Take 1,000 mg by mouth daily., Disp: , Rfl:    omeprazole  (PRILOSEC) 40 MG capsule, Take 1 capsule (40 mg total) by mouth daily., Disp: 90 capsule, Rfl: 3   Polyvinyl Alcohol-Povidone (REFRESH OP), Place 1 drop into the right eye daily as needed (dry eyes/irritation)., Disp: , Rfl:    promethazine -dextromethorphan (PROMETHAZINE -DM) 6.25-15 MG/5ML syrup, Take 5 mLs by mouth at bedtime as needed for cough., Disp: 118 mL, Rfl: 0   spironolactone  (ALDACTONE ) 25 MG tablet, TAKE 1 TABLET (25 MG TOTAL) BY MOUTH DAILY., Disp: 90 tablet, Rfl: 2   Subjective:   PATIENT ID: Anthony [name]. GENDER: male DOB: Nov 23, 1950, MRN: [id number]  Chief Complaint  Patient presents with   Cough    Cough with phlegm. Wheezing and shortness of breath on exertion.     HPI  Discussed the use of AI scribe software for clinical note transcription with the patient, who gave verbal consent to proceed.  History of Present Illness  Anthony [name] is a 73 year old male who presents with acute symptoms of cough and mucus production.  His symptoms began on Tuesday, July 16, 2024, with a progressively worsening cough and mucus production. Initially, he attributed these symptoms to allergies from working outside, but later dismissed this idea. He visited urgent care on Friday, July 19, 2024, where a chest x-ray was performed and he was told it looked good, but he was informed there were some mild linear opacities in the lung base. He was prescribed Augmentin  for seven days, bifenacin, Flonase, Tessalon  pearls, promethazine , and supportive care for a viral illness.  He experienced chills and slight fevers that peak and subside, along with a sore throat for a couple of days last week, and a runny nose. Shortness of breath began this week, worsening with physical  activity such as walking and talking, requiring him to stop and rest. He has no history of asthma but recalls having pneumonia in 2003. He mentions a slight wheeze and losing his voice, which is not as loud as usual.  He continues to take the prescribed antibiotics and reports spitting up mucus. He has not felt well enough to do much over the weekend and feels that his condition has worsened. He has not been tested for COVID-19 and has not received his flu shot this year.  He recently traveled to [facility] and attended a crowded event. He has been working outside in the yard.   Ancillary information including prior medications, full medical/surgical/family/social histories, and PFTs (when available) are listed below and have been reviewed.    Review of Systems  Constitutional:  Negative for chills, fever and weight loss.  Respiratory:  Positive for cough and sputum production. Negative for hemoptysis, shortness of breath and wheezing.   Cardiovascular:  Negative for chest pain.     Objective:   Vitals:  07/24/24 1309  BP: 138/68  Pulse: 76  Temp: 98.4 F (36.9 C)  TempSrc: Oral  SpO2: 96%  Weight: 244 lb 6.4 oz (110.9 kg)  Height: 5' 8 (1.727 m)   96% on RA BMI Readings from Last 3 Encounters:  07/24/24 37.16 kg/m  07/19/24 36.04 kg/m  02/21/24 35.58 kg/m   Wt Readings from Last 3 Encounters:  07/24/24 244 lb 6.4 oz (110.9 kg)  07/19/24 237 lb (107.5 kg)  02/21/24 234 lb (106.1 kg)     Physical Exam Constitutional:      Appearance: Normal appearance. He is obese. He is not ill-appearing.  HENT:     Head: Normocephalic and atraumatic.  Cardiovascular:     Rate and Rhythm: Normal rate and regular rhythm.     Pulses: Normal pulses.     Heart sounds: Normal heart sounds.  Pulmonary:     Effort: Pulmonary effort is normal.     Breath sounds: Normal breath sounds.  Neurological:     General: No focal deficit present.     Mental Status: He is alert and  oriented to person, place, and time. Mental status is at baseline.     Ancillary Information    Past Medical History:  Diagnosis Date   Benign neoplasm of colon    Benign neoplasm of skin of upper limb, including shoulder    Cancer of skin of external cheek    Carpal tunnel syndrome    Chest pain    a. normal cors by cath in 2015   Chronic diastolic HF (heart failure) (HCC)    Diaphragmatic hernia without mention of obstruction or gangrene    Dyslipidemia    Elevated prostate specific antigen (PSA)    GERD (gastroesophageal reflux disease)    Herpes zoster without mention of complication    History of cancer of external ear    History of hiatal hernia    Hypertension    Hypertrophy of prostate with urinary obstruction and other lower urinary tract symptoms (LUTS)    Impotence of organic origin    Lumbago    Obesity, unspecified    Osteoarthrosis, unspecified whether generalized or localized, lower leg    Other and unspecified hyperlipidemia    Other seborrheic keratosis    Pneumonia 2004   Stenosis, cervical spine    Unspecified tinnitus      Family History  Problem Relation Age of Onset   Diabetes Mother    Arrhythmia Mother    Hypertension Mother    Dementia Father    Pulmonary embolism Father    Hypertension Father    Cancer Sister        Liver cancer - Histocytic lymphoma   Cancer Sister 84       Skin cancer   Hypertension Sister 61   Diabetes Sister    Stroke Brother    Diabetes Brother    Hypertension Brother    Cancer Daughter    Cancer Maternal Grandmother        Skin cancer   Heart attack Maternal Grandfather    Cancer Paternal Grandfather    Hypertension Paternal Grandfather      Past Surgical History:  Procedure Laterality Date   ANTERIOR CERVICAL DECOMP/DISCECTOMY FUSION N/A 02/19/2018   Procedure: ANTERIOR CERVICAL DECOMPRESSION/DISCECTOMY FUSION - CERVICAL FOUR-CERVICAL FIVE - CERVICAL FIVE-CERVICAL SIX;  Surgeon: Louis Shove, MD;   Location: [facility] OR;  Service: Neurosurgery;  Laterality: N/A;   CARDIOVASCULAR STRESS TEST  06/15/2012   Non-diagnostic for ischemia. No  lexiscan  EKG changes.   CHOLECYSTECTOMY  08/22/1990   COLONOSCOPY  2022   EYE SURGERY     eyeband  08/22/2004   removal of right eyeband   HAND SURGERY  07/2022   right pointer knuckle replaced   INGUINAL HERNIA REPAIR  08/22/1982   left   INTRAOCULAR LENS REMOVAL  08/22/2006   right   LEFT HEART CATHETERIZATION WITH CORONARY ANGIOGRAM N/A 09/05/2013   Procedure: LEFT HEART CATHETERIZATION WITH CORONARY ANGIOGRAM;  Surgeon: Debby DELENA Sor, MD;  Location: [facility] CATH LAB;  Service: Cardiovascular;  Laterality: N/A;   LOWER VENOUS EXTREMITY DOPPLER  01/26/2011   No evidence of thrombus or thrombophlebitis.   RETINAL DETACHMENT SURGERY  08/23/1999   right   TOTAL HIP ARTHROPLASTY  08/22/2010   bilateral   TOTAL KNEE ARTHROPLASTY  2011, 2012   bilateral   TRANSTHORACIC ECHOCARDIOGRAM  10/05/2009   EF >55%, normal LV size, systolic, and diastolic function.   VENTRAL HERNIA REPAIR N/A 04/28/2023   Procedure: LAPAROSCOPIC VENTRAL HERNIA REPAIR WITH MESH;  Surgeon: Curvin Deward MOULD, MD;  Location: [facility] OR;  Service: General;  Laterality: N/A;    Social History   Socioeconomic History   Marital status: Married    Spouse name: Not on file   Number of children: 2   Years of education: Not on file   Highest education level: 12th grade  Occupational History   Occupation: Cabin crew --retired    Associate Professor: [employer]    Comment: And Dispensing Optician  Tobacco Use   Smoking status: Never    Passive exposure: Past   Smokeless tobacco: Never  Vaping Use   Vaping status: Never Used  Substance and Sexual Activity   Alcohol use: No   Drug use: No   Sexual activity: Yes    Partners: Female    Comment: wife  Other Topics Concern   Not on file  Social History Narrative   Daughter and son      Living Will   Wife is health care POA--daughter is alternate   Would  accept resuscitation attempts--but no prolonged ventilation or tube feeds   Social Drivers of Health   Financial Resource Strain: Low Risk  (02/28/2023)   Overall Financial Resource Strain (CARDIA)    Difficulty of Paying Living Expenses: Not hard at all  Food Insecurity: No Food Insecurity (04/28/2023)   Hunger Vital Sign    Worried About Running Out of Food in the Last Year: Never true    Ran Out of Food in the Last Year: Never true  Transportation Needs: No Transportation Needs (04/28/2023)   PRAPARE - Administrator, Civil Service (Medical): No    Lack of Transportation (Non-Medical): No  Physical Activity: Insufficiently Active (02/28/2023)   Exercise Vital Sign    Days of Exercise per Week: 1 day    Minutes of Exercise per Session: 20 min  Stress: No Stress Concern Present (02/28/2023)   [employer] of Occupational Health - Occupational Stress Questionnaire    Feeling of Stress : Only a little  Social Connections: Moderately Integrated (02/28/2023)   Social Connection and Isolation Panel    Frequency of Communication with Friends and Family: More than three times a week    Frequency of Social Gatherings with Friends and Family: Three times a week    Attends Religious Services: Never    Active Member of Clubs or Organizations: Yes    Attends Banker Meetings: More than 4 times per year  Marital Status: Married  Catering Manager Violence: Not At Risk (04/28/2023)   Humiliation, Afraid, Rape, and Kick questionnaire    Fear of Current or Ex-Partner: No    Emotionally Abused: No    Physically Abused: No    Sexually Abused: No     Allergies  Allergen Reactions   Red Yeast Rice [Cholestin] Other (See Comments)    Makes legs hurt, myopathy   Statins Other (See Comments)    muscle break down, Leg pain/cramps, statin myopathy   Codeine Other (See Comments)    Headache   Latex Rash     CBC    Component Value Date/Time   WBC 8.3 04/27/2023 0839   RBC  5.01 04/27/2023 0839   HGB 15.0 04/27/2023 0839   HCT 44.5 04/27/2023 0839   PLT 218 04/27/2023 0839   MCV 88.8 04/27/2023 0839   MCH 29.9 04/27/2023 0839   MCHC 33.7 04/27/2023 0839   RDW 13.2 04/27/2023 0839   LYMPHSABS 1,940 06/02/2022 1409   MONOABS 0.5 11/29/2017 0806   EOSABS 698 (H) 06/02/2022 1409   BASOSABS 107 06/02/2022 1409    Pulmonary Functions Testing Results:     No data to display          Outpatient Medications Prior to Visit  Medication Sig Dispense Refill   amLODipine  (NORVASC ) 5 MG tablet Take 0.5 tablets (2.5 mg total) by mouth daily. 45 tablet 3   amoxicillin -clavulanate (AUGMENTIN ) 875-125 MG tablet Take 1 tablet by mouth every 12 (twelve) hours for 7 days. 14 tablet 0   aspirin EC 81 MG tablet Take 81 mg by mouth daily. Swallow whole.     benzonatate  (TESSALON ) 100 MG capsule Take 1 capsule (100 mg total) by mouth 3 (three) times daily as needed for cough. 30 capsule 0   brimonidine  (ALPHAGAN  P) 0.1 % SOLN Place 1 drop into the right eye every 8 (eight) hours.     carvedilol  (COREG ) 6.25 MG tablet Take 1 tablet (6.25 mg total) by mouth 2 (two) times daily. 180 tablet 3   dorzolamide -timolol  (COSOPT ) 22.3-6.8 MG/ML ophthalmic solution Place 2 drops into the right eye 2 (two) times daily.      famotidine  (PEPCID ) 40 MG tablet Take 1 tablet (40 mg total) by mouth daily. 90 tablet 3   finasteride  (PROSCAR ) 5 MG tablet TAKE 1 TABLET BY MOUTH EVERY DAY IN THE MORNING 90 tablet 3   furosemide  (LASIX ) 20 MG tablet Take 1 tablet (20 mg total) by mouth daily. 90 tablet 3   guaiFENesin (MUCINEX) 600 MG 12 hr tablet Take 2 tablets (1,200 mg total) by mouth 2 (two) times daily. 30 tablet 0   Lactobacillus-Inulin (CULTURELLE ADULT ULT BALANCE PO) Take 1 capsule by mouth daily.     nitroGLYCERIN  (NITROSTAT ) 0.4 MG SL tablet Place 1 tablet (0.4 mg total) under the tongue every 5 (five) minutes as needed for chest pain. 30 tablet 11   olmesartan  (BENICAR ) 40 MG tablet  TAKE 1 TABLET BY MOUTH EVERY DAY 90 tablet 0   Omega-3 Fatty Acids (FISH OIL) 1000 MG CAPS Take 1,000 mg by mouth daily.     omeprazole  (PRILOSEC) 40 MG capsule Take 1 capsule (40 mg total) by mouth daily. 90 capsule 3   Polyvinyl Alcohol-Povidone (REFRESH OP) Place 1 drop into the right eye daily as needed (dry eyes/irritation).     promethazine -dextromethorphan (PROMETHAZINE -DM) 6.25-15 MG/5ML syrup Take 5 mLs by mouth at bedtime as needed for cough. 118 mL 0   spironolactone  (  ALDACTONE ) 25 MG tablet TAKE 1 TABLET (25 MG TOTAL) BY MOUTH DAILY. 90 tablet 2   amoxicillin -clavulanate (AUGMENTIN ) 875-125 MG tablet Take 1 tablet by mouth every 12 (twelve) hours for 7 days. 14 tablet 0   No facility-administered medications prior to visit.

## 2024-07-29 DIAGNOSIS — M25562 Pain in left knee: Secondary | ICD-10-CM | POA: Diagnosis not present

## 2024-07-29 DIAGNOSIS — M7632 Iliotibial band syndrome, left leg: Secondary | ICD-10-CM | POA: Diagnosis not present

## 2024-07-29 DIAGNOSIS — M25561 Pain in right knee: Secondary | ICD-10-CM | POA: Diagnosis not present

## 2024-07-29 DIAGNOSIS — R2689 Other abnormalities of gait and mobility: Secondary | ICD-10-CM | POA: Diagnosis not present

## 2024-07-29 DIAGNOSIS — M7631 Iliotibial band syndrome, right leg: Secondary | ICD-10-CM | POA: Diagnosis not present

## 2024-07-31 DIAGNOSIS — M7631 Iliotibial band syndrome, right leg: Secondary | ICD-10-CM | POA: Diagnosis not present

## 2024-07-31 DIAGNOSIS — M25561 Pain in right knee: Secondary | ICD-10-CM | POA: Diagnosis not present

## 2024-07-31 DIAGNOSIS — M7632 Iliotibial band syndrome, left leg: Secondary | ICD-10-CM | POA: Diagnosis not present

## 2024-07-31 DIAGNOSIS — R2689 Other abnormalities of gait and mobility: Secondary | ICD-10-CM | POA: Diagnosis not present

## 2024-07-31 DIAGNOSIS — M25562 Pain in left knee: Secondary | ICD-10-CM | POA: Diagnosis not present

## 2024-08-01 ENCOUNTER — Other Ambulatory Visit: Payer: Self-pay | Admitting: Student in an Organized Health Care Education/Training Program

## 2024-08-01 DIAGNOSIS — J069 Acute upper respiratory infection, unspecified: Secondary | ICD-10-CM

## 2024-08-01 NOTE — Telephone Encounter (Signed)
 Copied from CRM #[id number]. Topic: Clinical - Medication Refill >> Aug 01, 2024  8:37 AM Devaughn RAMAN wrote: Medication: azithromycin  (ZITHROMAX ) 250 MG tablet  Has the patient contacted their pharmacy? Yes (Agent: If no, request that the patient contact the pharmacy for the refill. If patient does not wish to contact the pharmacy document the reason why and proceed with request.) (Agent: If yes, when and what did the pharmacy advise?)  This is the patient's preferred pharmacy:  [phone] [place] - [address] [place] [address] Phone: [phone] Fax: [phone]  Is this the correct pharmacy for this prescription? Yes If no, delete pharmacy and type the correct one.   Has the prescription been filled recently? Yes  Is the patient out of the medication? Yes  Has the patient been seen for an appointment in the last year OR does the patient have an upcoming appointment? Yes  Can we respond through MyChart? Yes  Agent: Please be advised that Rx refills may take up to 3 business days. We ask that you follow-up with your pharmacy.

## 2024-08-07 ENCOUNTER — Ambulatory Visit: Payer: Self-pay

## 2024-08-07 NOTE — Telephone Encounter (Signed)
 NOTED

## 2024-08-07 NOTE — Telephone Encounter (Signed)
 FYI Only or Action Required?: FYI only for provider: appointment scheduled on 08/08/24.  Patient was last seen in primary care on 09/14/2023 by Jimmy Charlie FERNS, MD.  Called Nurse Triage reporting Headache and Sore Throat.  Symptoms began yesterday.  Interventions attempted: OTC medications: mucinex ; tylenol  and Rest, hydration, or home remedies.  Symptoms are: unchanged.  Triage Disposition: See PCP When Office is Open (Within 3 Days)  Patient/caregiver understands and will follow disposition?: Yes   Copied from CRM #[id number]. Topic: Clinical - Red Word Triage >> Aug 07, 2024  9:37 AM Isabell A wrote: Red Word that prompted transfer to Nurse Triage:  Patient had respiratory infection - seen at ER 2 weeks ago, prescribed antibiotics. Spouse states its coming back.  Experiencing headache, sore throat, stuffy & hoarse, aching in right chest. >> Aug 07, 2024  9:45 AM Isabell A wrote: Callback number: [phone]    Reason for Disposition  Cough has been present for > 3 weeks  Answer Assessment - Initial Assessment Questions Pt's wife contacted clinic stating that pt was tx'ed at [facility] 07/19/24 for similar symptoms. Pt was prescribed tessalon , guaifenesin , promethazine -DM and Augmentin . Pt completed abx and was feeling better until yesterday. Pt now had a dry, nonproductive cough, chest congestion, h/a and sore throat. Pt denies chest pain, states that aching is in R ribs and only when in coughing spell d/t pressure and unable to clear mucous. Pt is hydrating, using humidifier and taking mucinex /tylenol  for symptom management. Pt denies fever or SOB. Appointment scheduled for evaluation. Patient agrees with plan of care, and will call back if anything changes, or if symptoms worsen.     1. ONSET: When did the cough begin?      Pt treated at [facility] 11/28 for cough; was getting better after completing Augmentin  but cough returned yesterday with other symptoms (sore throat, h/a, congestion)    2. SEVERITY: How bad is the cough today?      Moderate; reports R rib pain d/t coughing pressure   3. SPUTUM: Describe the color of your sputum (e.g., none, dry cough; clear, white, yellow, green)     Unable to clear at this time  4. HEMOPTYSIS: Are you coughing up any blood? If Yes, ask: How much? (e.g., flecks, streaks, tablespoons, etc.)     None   5. DIFFICULTY BREATHING: Are you having difficulty breathing? If Yes, ask: How bad is it? (e.g., mild, moderate, severe)      None   6. FEVER: Do you have a fever? If Yes, ask: What is your temperature, how was it measured, and when did it start?     No  10. OTHER SYMPTOMS: Do you have any other symptoms? (e.g., runny nose, wheezing, chest pain)       Congestion, sore throat, h/a  Protocols used: Cough - Acute Non-Productive-A-AH

## 2024-08-08 ENCOUNTER — Encounter: Payer: Self-pay | Admitting: Family

## 2024-08-08 ENCOUNTER — Ambulatory Visit (INDEPENDENT_AMBULATORY_CARE_PROVIDER_SITE_OTHER): Admitting: Family

## 2024-08-08 VITALS — BP 162/80 | HR 70 | Temp 98.3°F | Wt 245.6 lb

## 2024-08-08 DIAGNOSIS — K219 Gastro-esophageal reflux disease without esophagitis: Secondary | ICD-10-CM

## 2024-08-08 DIAGNOSIS — J029 Acute pharyngitis, unspecified: Secondary | ICD-10-CM | POA: Diagnosis not present

## 2024-08-08 DIAGNOSIS — R0609 Other forms of dyspnea: Secondary | ICD-10-CM | POA: Diagnosis not present

## 2024-08-08 DIAGNOSIS — I10 Essential (primary) hypertension: Secondary | ICD-10-CM

## 2024-08-08 DIAGNOSIS — R051 Acute cough: Secondary | ICD-10-CM

## 2024-08-08 LAB — POCT RAPID STREP A (OFFICE): Rapid Strep A Screen: NEGATIVE

## 2024-08-08 MED ORDER — PANTOPRAZOLE SODIUM 40 MG PO TBEC: 40.0000 mg | DELAYED_RELEASE_TABLET | Freq: Every day | ORAL | 3 refills | Status: AC

## 2024-08-08 MED ORDER — BENZONATATE 100 MG PO CAPS: 100.0000 mg | ORAL_CAPSULE | Freq: Three times a day (TID) | ORAL | 0 refills | Status: DC | PRN

## 2024-08-08 NOTE — Progress Notes (Unsigned)
 Cardiology Office Note:    Date:  08/08/2024   ID:  Anthony KATHEE Glendia Mickey., DOB 06-28-1951, MRN [id number]  PCP:  Jimmy Charlie FERNS, MD  Cardiologist:  Lonni LITTIE Nanas, MD  Electrophysiologist:  None   Referring MD: Jimmy Charlie FERNS, MD   No chief complaint on file. ***  History of Present Illness:    Anthony Salomon. is a 73 y.o. male with a hx of hypertension, hyperlipidemia, chronic diastolic heart failure, esophageal stenosis, morbid obesity who presents for follow-up.  Previously followed with Dr. Burnard.  Coronary CTA 08/2018 showed minimal CAD, calcium  score 26 (28th percentile).  Echocardiogram 03/2023 showed EF 65 to 70%, indeterminate diastolic function, normal RV function, no significant valvular disease.  Past Medical History:  Diagnosis Date   Benign neoplasm of colon    Benign neoplasm of skin of upper limb, including shoulder    Cancer of skin of external cheek    Carpal tunnel syndrome    Chest pain    a. normal cors by cath in 2015   Chronic diastolic HF (heart failure) (HCC)    Diaphragmatic hernia without mention of obstruction or gangrene    Dyslipidemia    Elevated prostate specific antigen (PSA)    GERD (gastroesophageal reflux disease)    Herpes zoster without mention of complication    History of cancer of external ear    History of hiatal hernia    Hypertension    Hypertrophy of prostate with urinary obstruction and other lower urinary tract symptoms (LUTS)    Impotence of organic origin    Lumbago    Obesity, unspecified    Osteoarthrosis, unspecified whether generalized or localized, lower leg    Other and unspecified hyperlipidemia    Other seborrheic keratosis    Pneumonia 2004   Stenosis, cervical spine    Unspecified tinnitus     Past Surgical History:  Procedure Laterality Date   ANTERIOR CERVICAL DECOMP/DISCECTOMY FUSION N/A 02/19/2018   Procedure: ANTERIOR CERVICAL DECOMPRESSION/DISCECTOMY FUSION - CERVICAL FOUR-CERVICAL FIVE -  CERVICAL FIVE-CERVICAL SIX;  Surgeon: Louis Shove, MD;  Location: [facility] OR;  Service: Neurosurgery;  Laterality: N/A;   CARDIOVASCULAR STRESS TEST  06/15/2012   Non-diagnostic for ischemia. No lexiscan  EKG changes.   CHOLECYSTECTOMY  08/22/1990   COLONOSCOPY  2022   EYE SURGERY     eyeband  08/22/2004   removal of right eyeband   HAND SURGERY  07/2022   right pointer knuckle replaced   INGUINAL HERNIA REPAIR  08/22/1982   left   INTRAOCULAR LENS REMOVAL  08/22/2006   right   LEFT HEART CATHETERIZATION WITH CORONARY ANGIOGRAM N/A 09/05/2013   Procedure: LEFT HEART CATHETERIZATION WITH CORONARY ANGIOGRAM;  Surgeon: Debby DELENA Burnard, MD;  Location: [facility] CATH LAB;  Service: Cardiovascular;  Laterality: N/A;   LOWER VENOUS EXTREMITY DOPPLER  01/26/2011   No evidence of thrombus or thrombophlebitis.   RETINAL DETACHMENT SURGERY  08/23/1999   right   TOTAL HIP ARTHROPLASTY  08/22/2010   bilateral   TOTAL KNEE ARTHROPLASTY  2011, 2012   bilateral   TRANSTHORACIC ECHOCARDIOGRAM  10/05/2009   EF >55%, normal LV size, systolic, and diastolic function.   VENTRAL HERNIA REPAIR N/A 04/28/2023   Procedure: LAPAROSCOPIC VENTRAL HERNIA REPAIR WITH MESH;  Surgeon: Curvin Deward MOULD, MD;  Location: [facility] OR;  Service: General;  Laterality: N/A;    Current Medications: Active Medications[1]   Allergies:   Red yeast rice [cholestin], Statins, Codeine, and Latex   Social History  Socioeconomic History   Marital status: Married    Spouse name: Not on file   Number of children: 2   Years of education: Not on file   Highest education level: 12th grade  Occupational History   Occupation: Cabin crew --retired    Associate Professor: [employer]    Comment: And Dispensing Optician  Tobacco Use   Smoking status: Never    Passive exposure: Past   Smokeless tobacco: Never  Vaping Use   Vaping status: Never Used  Substance and Sexual Activity   Alcohol use: No   Drug use: No   Sexual activity: Yes    Partners: Female     Comment: wife  Other Topics Concern   Not on file  Social History Narrative   Daughter and son      Living Will   Wife is health care POA--daughter is alternate   Would accept resuscitation attempts--but no prolonged ventilation or tube feeds   Social Drivers of Health   Tobacco Use: Low Risk (08/08/2024)   Patient History    Smoking Tobacco Use: Never    Smokeless Tobacco Use: Never    Passive Exposure: Past  Recent Concern: Tobacco Use - Medium Risk (07/15/2024)   Received from [facility]   Patient History    Passive Exposure: Yes    Smokeless Tobacco Use: Never    Smoking Tobacco Use: Never  Financial Resource Strain: Low Risk (02/28/2023)   Overall Financial Resource Strain (CARDIA)    Difficulty of Paying Living Expenses: Not hard at all  Food Insecurity: No Food Insecurity (04/28/2023)   Hunger Vital Sign    Worried About Running Out of Food in the Last Year: Never true    Ran Out of Food in the Last Year: Never true  Transportation Needs: No Transportation Needs (04/28/2023)   PRAPARE - Administrator, Civil Service (Medical): No    Lack of Transportation (Non-Medical): No  Physical Activity: Insufficiently Active (02/28/2023)   Exercise Vital Sign    Days of Exercise per Week: 1 day    Minutes of Exercise per Session: 20 min  Stress: No Stress Concern Present (02/28/2023)   [employer] of Occupational Health - Occupational Stress Questionnaire    Feeling of Stress : Only a little  Social Connections: Moderately Integrated (02/28/2023)   Social Connection and Isolation Panel    Frequency of Communication with Friends and Family: More than three times a week    Frequency of Social Gatherings with Friends and Family: Three times a week    Attends Religious Services: Never    Active Member of Clubs or Organizations: Yes    Attends Banker Meetings: More than 4 times per year    Marital Status: Married  Depression (PHQ2-9): Low  Risk (09/14/2023)   Depression (PHQ2-9)    PHQ-2 Score: 0  Alcohol Screen: Low Risk (02/28/2023)   Alcohol Screen    Last Alcohol Screening Score (AUDIT): 1  Housing: Unknown (09/24/2023)   Received from [facility]   Epic    Unable to Pay for Housing in the Last Year: Not on file    Number of Times Moved in the Last Year: Not on file    At any time in the past 12 months, were you homeless or living in a shelter (including now)?: No  Utilities: Not At Risk (04/28/2023)   AHC Utilities    Threatened with loss of utilities: No  Health Literacy: Not on  file     Family History: The patient's ***family history includes Arrhythmia in his mother; Cancer in his daughter, maternal grandmother, paternal grandfather, and sister; Cancer (age of onset: 51) in his sister; Dementia in his father; Diabetes in his brother, mother, and sister; Heart attack in his maternal grandfather; Hypertension in his brother, father, mother, and paternal grandfather; Hypertension (age of onset: 61) in his sister; Pulmonary embolism in his father; Stroke in his brother.  ROS:   Please see the history of present illness.    *** All other systems reviewed and are negative.  EKGs/Labs/Other Studies Reviewed:    The following studies were reviewed today: ***  EKG:  EKG is *** ordered today.  The ekg ordered today demonstrates ***  Recent Labs: No results found for requested labs within last 365 days.  Recent Lipid Panel    Component Value Date/Time   CHOL 168 11/17/2023 0809   CHOL 191 07/22/2013 0835   TRIG 121 11/17/2023 0809   TRIG 253 (H) 07/22/2013 0835   HDL 42 11/17/2023 0809   HDL 38 (L) 07/22/2013 0835   CHOLHDL 4.0 11/17/2023 0809   CHOLHDL 3.6 03/30/2022 0858   VLDL 35 (H) 10/27/2016 0825   LDLCALC 104 (H) 11/17/2023 0809   LDLCALC 97 03/30/2022 0858   LDLCALC 102 (H) 07/22/2013 0835    Physical Exam:    VS:  There were no vitals taken for this visit.    Wt Readings from Last  3 Encounters:  08/08/24 245 lb 9.6 oz (111.4 kg)  07/24/24 244 lb 6.4 oz (110.9 kg)  07/19/24 237 lb (107.5 kg)     GEN: *** Well nourished, well developed in no acute distress HEENT: Normal NECK: No JVD; No carotid bruits LYMPHATICS: No lymphadenopathy CARDIAC: ***RRR, no murmurs, rubs, gallops RESPIRATORY:  Clear to auscultation without rales, wheezing or rhonchi  ABDOMEN: Soft, non-tender, non-distended MUSCULOSKELETAL:  No edema; No deformity  SKIN: Warm and dry NEUROLOGIC:  Alert and oriented x 3 PSYCHIATRIC:  Normal affect   ASSESSMENT:    No diagnosis found. PLAN:    CAD: Coronary CTA 08/2018 showed minimal CAD, calcium  score 26 (28th percentile).  Echocardiogram 03/2023 showed EF 65 to 70%, indeterminate diastolic function, normal RV function, no significant valvular disease. - Has had intolerance to statins***  Chronic diastolic heart failure: On Lasix  20 mg daily  Hypertension: On amlodipine  2.5 mg daily, carvedilol  6.25 mg twice daily, olmesartan  40 mg daily, spironolactone  25 mg daily  Hyperlipidemia: LDL 104 on 10/2023.  Has been intolerant to statins  Obesity: There is no height or weight on file to calculate BMI.   RTC in***  Medication Adjustments/Labs and Tests Ordered: Current medicines are reviewed at length with the patient today.  Concerns regarding medicines are outlined above.  No orders of the defined types were placed in this encounter.  No orders of the defined types were placed in this encounter.   There are no Patient Instructions on file for this visit.   Signed, Lonni LITTIE Nanas, MD  08/08/2024 4:04 PM     [facility]    [1]  No outpatient medications have been marked as taking for the 08/09/24 encounter (Appointment) with Nanas Lonni LITTIE, MD.

## 2024-08-08 NOTE — Progress Notes (Signed)
 Established Patient Office Visit  Subjective:      CC:  Chief Complaint  Patient presents with   Acute Visit    Has been feeling bad since before Thanksgiving. Has seen urgent care and pulmonary.    HPI: Anthony B Amalfitano Jr. is a 73 y.o. male presenting on 08/08/2024 for Acute Visit (Has been feeling bad since before Thanksgiving. Has seen urgent care and pulmonary.) .  Discussed the use of AI scribe software for clinical note transcription with the patient, who gave verbal consent to proceed.  History of Present Illness Anthony [name] is a 73 year old male with heart failure who presents with persistent sore throat and respiratory symptoms.  He began feeling unwell right before Thanksgiving and sought care at an urgent care facility the day after Thanksgiving. A chest x-ray showed mild linear opacities consistent with atelectasis. He was initially prescribed Augmentin , which he completed, and experienced some improvement in symptoms. However, after completing the course, his symptoms returned, including a sore throat that worsens at night, swollen glands, and a persistent cough, particularly when lying down.  He later consulted with another physician and was prescribed a Z-Pak in addition to Augmentin . Despite this treatment, his sore throat persists, and he feels more short of breath than usual, with difficulty completing long sentences. He denies fever or chills but notes fluctuating body temperature at night. He has been using over-the-counter remedies such as warm salt water gargles and Mucinex , as well as Tessalon  Pearls, which have helped reduce his cough.  He has a history of heart failure and is currently on medications including carvedilol , olmesartan , amlodipine , Lasix  (20 mg daily), spironolactone , finasteride , and a baby aspirin. He feels fatigued and lacking energy, and has gained some weight recently. He has not yet received his flu shot or RSV vaccine, as he was  waiting to recover from his current illness.   Wt Readings from Last 3 Encounters:  08/08/24 245 lb 9.6 oz (111.4 kg)  07/24/24 244 lb 6.4 oz (110.9 kg)  07/19/24 237 lb (107.5 kg)           Social history:  Relevant past medical, surgical, family and social history reviewed and updated as indicated. Interim medical history since our last visit reviewed.  Allergies and medications reviewed and updated.  DATA REVIEWED: CHART IN EPIC     ROS: Negative unless specifically indicated above in HPI.   Current Medications[1]        Objective:        BP (!) 162/80   Pulse 70   Temp 98.3 F (36.8 C) (Temporal)   Wt 245 lb 9.6 oz (111.4 kg)   SpO2 96%   BMI 37.34 kg/m   Physical Exam HEENT: Pharynx erythematous. NECK: Cervical lymph nodes palpable, non-tender. CHEST: Lungs clear to auscultation bilaterally.  Wt Readings from Last 3 Encounters:  08/08/24 245 lb 9.6 oz (111.4 kg)  07/24/24 244 lb 6.4 oz (110.9 kg)  07/19/24 237 lb (107.5 kg)    Physical Exam       Results Radiology Chest X-ray (07/24/2024): Normal Chest X-ray (07/19/2024): Mild linear opacities in the lung bases consistent with atelectasis; prominent bronchovascular markings; no pneumonia  Assessment & Plan:   Assessment and Plan Assessment & Plan Acute upper respiratory infection Persistent sore throat, worse at night, with associated cough and fatigue. Symptoms have persisted for over a month. Previous treatment with Augmentin  and Z-Pak. No fever or chills. Differential includes bronchitis, pneumonia, or  viral infection. Chest x-ray negative for pneumonia. No wheezing. Lymphadenopathy present. Sore throat developed after initial presentation. Coughs from bronchitis, COVID, or upper/lower respiratory infections can last over a month. - Ordered white blood cell count - Ordered brain natriuretic peptide (BNP) test to rule out heart failure flare - Continue current medications including  Mucinex  and Tessalon  Pearls  Heart failure Increased shortness of breath and fatigue. No significant weight gain since last visit. No peripheral edema. On carvedilol , olmesartan , Cosopt , amlodipine , Lasix , spironolactone , finasteride , and baby aspirin. Scheduled to see cardiologist tomorrow. BNP test to rule out heart failure flare. - Ordered brain natriuretic peptide (BNP) test to rule out heart failure flare, less likely as without pedal edema or weight changes - Continue current heart failure medications        Return for f/u PCP if no improvement in symptoms.     Ginger Patrick, MSN, APRN, FNP-C [facility]        [1]  Current Outpatient Medications:    amLODipine  (NORVASC ) 5 MG tablet, Take 0.5 tablets (2.5 mg total) by mouth daily., Disp: 45 tablet, Rfl: 3   aspirin EC 81 MG tablet, Take 81 mg by mouth daily. Swallow whole., Disp: , Rfl:    brimonidine  (ALPHAGAN  P) 0.1 % SOLN, Place 1 drop into the right eye every 8 (eight) hours., Disp: , Rfl:    carvedilol  (COREG ) 6.25 MG tablet, Take 1 tablet (6.25 mg total) by mouth 2 (two) times daily., Disp: 180 tablet, Rfl: 3   dorzolamide -timolol  (COSOPT ) 22.3-6.8 MG/ML ophthalmic solution, Place 2 drops into the right eye 2 (two) times daily. , Disp: , Rfl:    famotidine  (PEPCID ) 40 MG tablet, Take 1 tablet (40 mg total) by mouth daily., Disp: 90 tablet, Rfl: 3   finasteride  (PROSCAR ) 5 MG tablet, TAKE 1 TABLET BY MOUTH EVERY DAY IN THE MORNING, Disp: 90 tablet, Rfl: 3   furosemide  (LASIX ) 20 MG tablet, Take 1 tablet (20 mg total) by mouth daily., Disp: 90 tablet, Rfl: 3   guaiFENesin  (MUCINEX ) 600 MG 12 hr tablet, Take 2 tablets (1,200 mg total) by mouth 2 (two) times daily., Disp: 30 tablet, Rfl: 0   Lactobacillus-Inulin (CULTURELLE ADULT ULT BALANCE PO), Take 1 capsule by mouth daily., Disp: , Rfl:    nitroGLYCERIN  (NITROSTAT ) 0.4 MG SL tablet, Place 1 tablet (0.4 mg total) under the tongue every 5  (five) minutes as needed for chest pain., Disp: 30 tablet, Rfl: 11   olmesartan  (BENICAR ) 40 MG tablet, TAKE 1 TABLET BY MOUTH EVERY DAY, Disp: 90 tablet, Rfl: 0   Omega-3 Fatty Acids (FISH OIL) 1000 MG CAPS, Take 1,000 mg by mouth daily., Disp: , Rfl:    pantoprazole  (PROTONIX ) 40 MG tablet, Take 1 tablet (40 mg total) by mouth daily., Disp: 30 tablet, Rfl: 3   Polyvinyl Alcohol-Povidone (REFRESH OP), Place 1 drop into the right eye daily as needed (dry eyes/irritation)., Disp: , Rfl:    promethazine -dextromethorphan (PROMETHAZINE -DM) 6.25-15 MG/5ML syrup, Take 5 mLs by mouth at bedtime as needed for cough., Disp: 118 mL, Rfl: 0   spironolactone  (ALDACTONE ) 25 MG tablet, TAKE 1 TABLET (25 MG TOTAL) BY MOUTH DAILY., Disp: 90 tablet, Rfl: 2   benzonatate  (TESSALON ) 100 MG capsule, Take 1 capsule (100 mg total) by mouth 3 (three) times daily as needed for cough., Disp: 30 capsule, Rfl: 0

## 2024-08-09 ENCOUNTER — Ambulatory Visit: Attending: Cardiology | Admitting: Cardiology

## 2024-08-09 ENCOUNTER — Encounter: Payer: Self-pay | Admitting: Cardiology

## 2024-08-09 ENCOUNTER — Ambulatory Visit: Payer: Self-pay | Admitting: Family

## 2024-08-09 VITALS — BP 130/74 | HR 69 | Ht 70.0 in | Wt 243.0 lb

## 2024-08-09 DIAGNOSIS — E785 Hyperlipidemia, unspecified: Secondary | ICD-10-CM

## 2024-08-09 DIAGNOSIS — R079 Chest pain, unspecified: Secondary | ICD-10-CM

## 2024-08-09 DIAGNOSIS — I10 Essential (primary) hypertension: Secondary | ICD-10-CM | POA: Diagnosis not present

## 2024-08-09 DIAGNOSIS — I5032 Chronic diastolic (congestive) heart failure: Secondary | ICD-10-CM | POA: Diagnosis not present

## 2024-08-09 DIAGNOSIS — I2510 Atherosclerotic heart disease of native coronary artery without angina pectoris: Secondary | ICD-10-CM | POA: Diagnosis not present

## 2024-08-09 LAB — BASIC METABOLIC PANEL WITH GFR
BUN/Creatinine Ratio: 14 (ref 10–24)
BUN: 17 mg/dL (ref 6–23)
BUN: 15 mg/dL (ref 8–27)
CO2: 28 meq/L (ref 19–32)
CO2: 25 mmol/L (ref 20–29)
Calcium: 8.9 mg/dL (ref 8.4–10.5)
Calcium: 9.6 mg/dL (ref 8.6–10.2)
Chloride: 103 meq/L (ref 96–112)
Chloride: 103 mmol/L (ref 96–106)
Creatinine, Ser: 0.97 mg/dL (ref 0.40–1.50)
Creatinine, Ser: 1.04 mg/dL (ref 0.76–1.27)
GFR: 76.32 mL/min
Glucose, Bld: 74 mg/dL (ref 70–99)
Glucose: 96 mg/dL (ref 70–99)
Potassium: 4.1 meq/L (ref 3.5–5.1)
Potassium: 4.5 mmol/L (ref 3.5–5.2)
Sodium: 140 meq/L (ref 135–145)
Sodium: 142 mmol/L (ref 134–144)
eGFR: 75 mL/min/1.73

## 2024-08-09 LAB — CBC WITH DIFFERENTIAL/PLATELET
Basophils Absolute: 0.1 K/uL (ref 0.0–0.1)
Basophils Relative: 1.3 % (ref 0.0–3.0)
Eosinophils Absolute: 0.4 K/uL (ref 0.0–0.7)
Eosinophils Relative: 3.6 % (ref 0.0–5.0)
HCT: 41.8 % (ref 39.0–52.0)
Hemoglobin: 14.1 g/dL (ref 13.0–17.0)
Lymphocytes Relative: 19.3 % (ref 12.0–46.0)
Lymphs Abs: 1.9 K/uL (ref 0.7–4.0)
MCHC: 33.7 g/dL (ref 30.0–36.0)
MCV: 87 fl (ref 78.0–100.0)
Monocytes Absolute: 0.9 K/uL (ref 0.1–1.0)
Monocytes Relative: 9.4 % (ref 3.0–12.0)
Neutro Abs: 6.5 K/uL (ref 1.4–7.7)
Neutrophils Relative %: 66.4 % (ref 43.0–77.0)
Platelets: 260 K/uL (ref 150.0–400.0)
RBC: 4.8 Mil/uL (ref 4.22–5.81)
RDW: 14.4 % (ref 11.5–15.5)
WBC: 9.8 K/uL (ref 4.0–10.5)

## 2024-08-09 LAB — TSH: TSH: 3.54 u[IU]/mL (ref 0.35–5.50)

## 2024-08-09 LAB — BRAIN NATRIURETIC PEPTIDE: Pro B Natriuretic peptide (BNP): 104 pg/mL — ABNORMAL HIGH (ref 1.0–100.0)

## 2024-08-09 LAB — MAGNESIUM: Magnesium: 2 mg/dL (ref 1.6–2.3)

## 2024-08-09 MED ORDER — METOPROLOL TARTRATE 100 MG PO TABS: 100.0000 mg | ORAL_TABLET | Freq: Once | ORAL | 0 refills | Status: AC

## 2024-08-09 MED ORDER — EZETIMIBE 10 MG PO TABS: 10.0000 mg | ORAL_TABLET | Freq: Every day | ORAL | 3 refills | Status: AC

## 2024-08-09 NOTE — Patient Instructions (Signed)
 Medication Instructions:  START ZETIA  10 MG DAILY *If you need a refill on your cardiac medications before your next appointment, please call your pharmacy*  Lab Work: BMET AND MAGNESIUM  TODAY If you have labs (blood work) drawn today and your tests are completely normal, you will receive your results only by: MyChart Message (if you have MyChart) OR A paper copy in the mail If you have any lab test that is abnormal or we need to change your treatment, we will call you to review the results.  Testing/Procedures:   Your cardiac CT will be scheduled at one of the below locations:   [facility] [address] [place], [place] [address] [phone] (Severe contrast allergies only)  OR    [facility] [address]  [place], [place] [address]    If scheduled at [facility], please arrive at the [facility] En[facility] (Entrance C2) of [facility] 30 minutes prior to test start time. You can use the FREE valet parking offered at entrance C (encouraged to control the heart rate for the test)  Proceed to the [facility] Radiology Department (first floor) to check-in and test prep.  All radiology patients and guests should use entrance C2 at [facility], accessed from [facility], even though the hospital's physical address listed is [address].  If scheduled at the Heart and Vascular Tower at [address], please enter the parking lot using the [address] entrance and use the FREE valet service at the patient drop-off area. Enter the building and check-in with registration on the main floor.  Please follow these instructions carefully (unless otherwise directed):  An IV will be required for this test and Nitroglycerin  will be given.  Hold all erectile dysfunction medications at least 3 days (72 hrs) prior to test. (Ie viagra, cialis , sildenafil, tadalafil , etc)   On the Night  Before the Test: Be sure to Drink plenty of water. Do not consume any caffeinated/decaffeinated beverages or chocolate 12 hours prior to your test. Do not take any antihistamines 12 hours prior to your test.  On the Day of the Test: Drink plenty of water until 1 hour prior to the test. Do not eat any food 1 hour prior to test. You may take your regular medications prior to the test.  TAKE ONE TIME DOSE OF METOPROLOL  (LOPRESSOR ) 100 MG 2 HOURS PRIOR TO TEST. HOLD CARVEDILOL , FUROSEMIDE  AND SPIRONOLACTONE  THE MORNING OF TEST Patients who wear a continuous glucose monitor MUST remove the device prior to scanning. FEMALES- please wear underwire-free bra if available, avoid dresses & tight clothing      After the Test: Drink plenty of water. After receiving IV contrast, you may experience a mild flushed feeling. This is normal. On occasion, you may experience a mild rash up to 24 hours after the test. This is not dangerous. If this occurs, you can take Benadryl 25 mg, Zyrtec, Claritin, or Allegra and increase your fluid intake. (Patients taking Tikosyn should avoid Benadryl, and may take Zyrtec, Claritin, or Allegra) If you experience trouble breathing, this can be serious. If it is severe call 911 IMMEDIATELY. If it is mild, please call our office.  We will call to schedule your test 2-4 weeks out understanding that some insurance companies will need an authorization prior to the service being performed.   For more information and frequently asked questions, please visit our website : [email or web address]  For non-scheduling  related questions, please contact the cardiac imaging nurse navigator should you have any questions/concerns: Cardiac Imaging Nurse Navigators Direct Office Dial: [phone]   For scheduling needs, including cancellations and rescheduling, please call Brittany, [phone].   Follow-Up: At [facility], you and your health needs are our  priority.  As part of our continuing mission to provide you with exceptional heart care, our providers are all part of one team.  This team includes your primary Cardiologist (physician) and Advanced Practice Providers or APPs (Physician Assistants and Nurse Practitioners) who all work together to provide you with the care you need, when you need it.  Your next appointment:   6 month(s)  Provider:   Lonni LITTIE Nanas, MD

## 2024-08-12 ENCOUNTER — Ambulatory Visit: Payer: Self-pay | Admitting: Cardiology

## 2024-08-12 NOTE — Telephone Encounter (Signed)
 Called and made patient aware per Dr. Kate lab work was normal. Patient verbalized an understanding.

## 2024-08-21 ENCOUNTER — Telehealth: Payer: Self-pay | Admitting: Cardiology

## 2024-08-21 ENCOUNTER — Encounter: Payer: Self-pay | Admitting: Cardiology

## 2024-08-21 NOTE — Telephone Encounter (Signed)
 Called and spoke to Wife, Meade. Reviewed IN DETAIL Cardiac CT pt instructions. Pt is scheduled for this test on 08/27/24 at 8:00 am (arriving at 7:30 am).

## 2024-08-21 NOTE — Telephone Encounter (Signed)
 Wife Chelsea) wants a call back to discuss questions regarding patient's upcoming procedure.

## 2024-08-23 ENCOUNTER — Encounter (HOSPITAL_COMMUNITY): Payer: Self-pay

## 2024-08-27 ENCOUNTER — Ambulatory Visit (HOSPITAL_COMMUNITY)
Admission: RE | Admit: 2024-08-27 | Discharge: 2024-08-27 | Disposition: A | Discharge status: Home / Self Care | Source: Ambulatory Visit | Attending: Cardiology | Admitting: Cardiology

## 2024-08-27 DIAGNOSIS — I2510 Atherosclerotic heart disease of native coronary artery without angina pectoris: Secondary | ICD-10-CM | POA: Insufficient documentation

## 2024-08-27 DIAGNOSIS — R079 Chest pain, unspecified: Secondary | ICD-10-CM | POA: Diagnosis not present

## 2024-08-27 MED ORDER — IOHEXOL 350 MG/ML SOLN
100.0000 mL | Freq: Once | INTRAVENOUS | Status: AC | PRN
  Administered 2024-08-27: 100 mL via INTRAVENOUS

## 2024-08-27 MED ORDER — NITROGLYCERIN 0.4 MG SL SUBL
0.8000 mg | SUBLINGUAL_TABLET | Freq: Once | SUBLINGUAL | Status: AC
  Administered 2024-08-27: 0.8 mg via SUBLINGUAL

## 2024-08-30 ENCOUNTER — Telehealth: Payer: Self-pay | Admitting: *Deleted

## 2024-08-30 DIAGNOSIS — E785 Hyperlipidemia, unspecified: Secondary | ICD-10-CM

## 2024-08-30 NOTE — Telephone Encounter (Signed)
 Called patient and made him  to continue zetia  10 mg and have lipid panel done in 2 months. Patient verbalized understanding

## 2024-09-04 ENCOUNTER — Ambulatory Visit: Admitting: Internal Medicine

## 2024-09-04 ENCOUNTER — Encounter: Payer: Self-pay | Admitting: Internal Medicine

## 2024-09-04 ENCOUNTER — Encounter: Payer: Self-pay | Admitting: Cardiology

## 2024-09-04 VITALS — BP 174/96 | HR 66 | Ht 70.0 in | Wt 252.0 lb

## 2024-09-04 DIAGNOSIS — R0609 Other forms of dyspnea: Secondary | ICD-10-CM | POA: Diagnosis not present

## 2024-09-04 DIAGNOSIS — R058 Other specified cough: Secondary | ICD-10-CM | POA: Diagnosis not present

## 2024-09-04 DIAGNOSIS — I10 Essential (primary) hypertension: Secondary | ICD-10-CM | POA: Diagnosis not present

## 2024-09-04 NOTE — Patient Instructions (Addendum)
 Increase the amlodipine  5 mg daily and check back in with your Primary care doctor    If you are satisfied with your treatment plan,  let your doctor know and he/she can either refill your medications or you can return here when your prescription runs out.     If in any way you are not 100% satisfied,  please tell us .  If 100% better, tell your friends!  Pulmonary follow up is as needed

## 2024-09-04 NOTE — Assessment & Plan Note (Addendum)
 Onset 2022 in pt with chronic GERD on ACEi  - d/c acei 08/05/2022 and max rx for GERD / tessalon  prn > resolved as of 11/03/2022 as were his choking sensations > advised no more acei/ resume gerd rx as per PCP recs  - flare with URI late Nov 2025 resolved with abx/ continued gerd RX   ACE inhibitors are problematic in  pts with airway complaints because  even experienced pulmonologists can't always distinguish ace effects from copd/asthma.  By themselves they don't actually cause a problem, much like oxygen can't by itself start a fire, but they certainly serve as a powerful catalyst or enhancer for any fire  or inflammatory process in the upper airway, be it caused by URI, or an ET  tube or more commonly reflux (especially in the obese or pts with known GERD or who are on biphoshonates).   Rec: Max gerd rx/ wt loss and f/u pulmonary prn    In the era of ARB near equivalency until we have a better handle on the reversibility of the airway problem, it just makes sense to avoid ACEI  entirely in the short run and then decide later, having established a level of airway control using a reasonable limited regimen, whether to add back ace but even then being very careful to observe the pt for worsening airway control and number of meds used/ needed to control symptoms.

## 2024-09-04 NOTE — Assessment & Plan Note (Addendum)
 Onset with upper airway cough 2022 - 08/05/2022   Walked on RA  x  3  lap(s) =  approx 525  ft  @ moderate pace, stopped due to end of study  with lowest 02 sats 93%   No longer limitedy by doe, never smoker,  so no need for pulmonary f/u   >>> see again prn either RDS or Anthony [name]

## 2024-09-04 NOTE — Progress Notes (Signed)
 "  Anthony [name]., male    DOB: 1951/06/06   MRN: [id number]   Brief patient profile:  21   yowm never smoker wm referred to pulmonary clinic in [facility]  08/05/2022 by anesthesiology for preop eval for cough x one year assoc with mild doe.       History of Present Illness  08/05/2022  Pulmonary/ 1st office eval/ Zehra Rucci / [employer] on ACEi  Chief Complaint  Patient presents with   Consult  Dyspnea:  worse over the last year prior to OV  but limited anyway to a block or two due hips and knees s/p multiple joint replacement but no h/o RA  Cough: p eating before get up/ choking sensation no better with egd (the sensation is in the throat, not in the chest) and still having overt hb on ppi though not ac / tessalon  helps the cough  Sleep: adjustable to 30 degrees / no cough noct  SABA use: none  Rec Nexium  40 mg should be Take 30- 60 min before your first and last meals of the day  Stop lisinopril and the cough should improve in 3 days but if not ok use tessalon  200 mg up to 3 x daily if needed GERD diet    11/03/2022  f/u ov/[place] office/Jonie Burdell re: uacs  maint on  longterm (baseline) gerd rx and off acei   Chief Complaint  Patient presents with   Follow-up    Needs refill on olmesartan .  Cough and DOE have improved since stopping lisinopril  Dyspnea:  only on fast walk / no routine ex due to hip limits Cough: resolved / no more choking  Sleeping: 30 degrees ok  SABA use: non  Rec F/u prn   Degali 07/24/24 re URI Please continue taking Augmentin  as prescribed and start the azithromycin  (Z-Pak) as directed     09/04/2024  f/u ov/[place] office/Broedy Osbourne re: URI/ flare of UACS  maint on gerd rx  Chief Complaint  Patient presents with   Cough    Overall better, some mucus brownish    Dyspnea:  limited by R hip > doe  Cough: gone  Sleeping: HOB x 30 degrees s resp cc  SABA use: nonw  02: none    No obvious day to day or daytime variability or assoc excess/  purulent sputum or mucus plugs or hemoptysis or cp or chest tightness, subjective wheeze or overt sinus or hb symptoms.    Also denies any obvious fluctuation of symptoms with weather or environmental changes or other aggravating or alleviating factors except as outlined above   No unusual exposure hx or h/o childhood pna/ asthma or knowledge of premature birth.  Current Allergies, Complete Past Medical History, Past Surgical History, Family History, and Social History were reviewed in [employer] record.  ROS  The following are not active complaints unless bolded Hoarseness, sore throat, dysphagia, dental problems, itching, sneezing,  nasal congestion or discharge of excess mucus or purulent secretions, ear ache,   fever, chills, sweats, unintended wt loss or wt gain, classically pleuritic or exertional cp,  orthopnea pnd or arm/hand swelling  or leg swelling, presyncope, palpitations, abdominal pain, anorexia, nausea, vomiting, diarrhea  or change in bowel habits or change in bladder habits, change in stools or change in urine, dysuria, hematuria,  rash, arthralgias, visual complaints, headache, numbness, weakness or ataxia or problems with walking or coordination,  change in mood or  memory.  Outpatient Medications Prior to Visit  Medication Sig Dispense Refill   amLODipine  (NORVASC ) 5 MG tablet Take 0.5 tablets (2.5 mg total) by mouth daily. 45 tablet 3   aspirin EC 81 MG tablet Take 81 mg by mouth daily. Swallow whole.     benzonatate  (TESSALON ) 100 MG capsule Take 1 capsule (100 mg total) by mouth 3 (three) times daily as needed for cough. 30 capsule 0   brimonidine  (ALPHAGAN  P) 0.1 % SOLN Place 1 drop into the right eye every 8 (eight) hours.     carvedilol  (COREG ) 6.25 MG tablet Take 1 tablet (6.25 mg total) by mouth 2 (two) times daily. 180 tablet 3   dorzolamide -timolol  (COSOPT ) 22.3-6.8 MG/ML ophthalmic solution Place 2 drops into the right eye 2 (two)  times daily.      ezetimibe  (ZETIA ) 10 MG tablet Take 1 tablet (10 mg total) by mouth daily. 90 tablet 3   famotidine  (PEPCID ) 40 MG tablet Take 1 tablet (40 mg total) by mouth daily. 90 tablet 3   finasteride  (PROSCAR ) 5 MG tablet TAKE 1 TABLET BY MOUTH EVERY DAY IN THE MORNING 90 tablet 3   furosemide  (LASIX ) 20 MG tablet Take 1 tablet (20 mg total) by mouth daily. 90 tablet 3   Lactobacillus-Inulin (CULTURELLE ADULT ULT BALANCE PO) Take 1 capsule by mouth daily.     nitroGLYCERIN  (NITROSTAT ) 0.4 MG SL tablet Place 1 tablet (0.4 mg total) under the tongue every 5 (five) minutes as needed for chest pain. 30 tablet 11   olmesartan  (BENICAR ) 40 MG tablet TAKE 1 TABLET BY MOUTH EVERY DAY 90 tablet 0   Omega-3 Fatty Acids (FISH OIL) 1000 MG CAPS Take 1,000 mg by mouth daily.     pantoprazole  (PROTONIX ) 40 MG tablet Take 1 tablet (40 mg total) by mouth daily. 30 tablet 3   Polyvinyl Alcohol-Povidone (REFRESH OP) Place 1 drop into the right eye daily as needed (dry eyes/irritation).     promethazine -dextromethorphan (PROMETHAZINE -DM) 6.25-15 MG/5ML syrup Take 5 mLs by mouth at bedtime as needed for cough. 118 mL 0   spironolactone  (ALDACTONE ) 25 MG tablet TAKE 1 TABLET (25 MG TOTAL) BY MOUTH DAILY. 90 tablet 2   guaiFENesin  (MUCINEX ) 600 MG 12 hr tablet Take 2 tablets (1,200 mg total) by mouth 2 (two) times daily. (Patient not taking: Reported on 09/04/2024) 30 tablet 0   metoprolol  tartrate (LOPRESSOR ) 100 MG tablet Take 1 tablet (100 mg total) by mouth once. 2 HOURS PRIOR TO PROCEDURE (Patient not taking: Reported on 09/04/2024) 1 tablet 0   No facility-administered medications prior to visit.     Past Medical History:  Diagnosis Date   Benign neoplasm of colon    Benign neoplasm of skin of upper limb, including shoulder    Cancer of skin of external cheek    Carpal tunnel syndrome    Chest pain    a. normal cors by cath in 2015   Diaphragmatic hernia without mention of obstruction or gangrene     Dyslipidemia    Elevated prostate specific antigen (PSA)    GERD (gastroesophageal reflux disease)    Herpes zoster without mention of complication    History of cancer of external ear    History of hiatal hernia    Hypertension    Hypertrophy of prostate with urinary obstruction and other lower urinary tract symptoms (LUTS)    Impotence of organic origin    Lumbago    Obesity, unspecified    Osteoarthrosis, unspecified whether generalized or  localized, lower leg    Other and unspecified hyperlipidemia    Other malaise and fatigue    Other seborrheic keratosis    Special screening for malignant neoplasm of prostate    Stenosis, cervical spine    Unspecified tinnitus         Objective:    Wts   09/04/2024        252   11/03/22 289 lb 3.2 oz (131.2 kg)  09/12/22 286 lb (129.7 kg)  08/17/22 289 lb (131.1 kg)     Vital signs reviewed  09/04/2024  - Note at rest 02 sats  99% on RA and note BP up    General appearance:    very pleasant mod obese (by BMI) amb wm nad    HEENT : Oropharynx  clear M3     Nasal turbinates min edema/ R ear TM a bit dull but nl light reflex and no redness or a/f level    NECK :  without  apparent JVD/ palpable Nodes/TM    LUNGS: no acc muscle use,  Nl contour chest which is clear to A and P bilaterally without cough on insp or exp maneuvers   CV:  RRR  no s3 or murmur or increase in P2, and no edema   ABD:  soft and nontender   MS:  Gait min limp/ no assist/  ext warm without deformities Or obvious joint restrictions  calf tenderness, cyanosis or clubbing    SKIN: warm and dry without lesions    NEURO:  alert, approp, nl sensorium with  no motor or cerebellar deficits apparent.       Assessment   Assessment & Plan Upper airway cough syndrome Onset 2022 in pt with chronic GERD on ACEi  - d/c acei 08/05/2022 and max rx for GERD / tessalon  prn > resolved as of 11/03/2022 as were his choking sensations > advised no more acei/ resume gerd  rx as per PCP recs  - flare with URI late Nov 2025 resolved with abx/ continued gerd RX   ACE inhibitors are problematic in  pts with airway complaints because  even experienced pulmonologists can't always distinguish ace effects from copd/asthma.  By themselves they don't actually cause a problem, much like oxygen can't by itself start a fire, but they certainly serve as a powerful catalyst or enhancer for any fire  or inflammatory process in the upper airway, be it caused by URI, or an ET  tube or more commonly reflux (especially in the obese or pts with known GERD or who are on biphoshonates).   Rec: Max gerd rx/ wt loss and f/u pulmonary prn    In the era of ARB near equivalency until we have a better handle on the reversibility of the airway problem, it just makes sense to avoid ACEI  entirely in the short run and then decide later, having established a level of airway control using a reasonable limited regimen, whether to add back ace but even then being very careful to observe the pt for worsening airway control and number of meds used/ needed to control symptoms.   DOE (dyspnea on exertion) Onset with upper airway cough 2022 - 08/05/2022   Walked on RA  x  3  lap(s) =  approx 525  ft  @ moderate pace, stopped due to end of study  with lowest 02 sats 93%   No longer limitedy by doe, never smoker,  so no need for pulmonary f/u   >>>  see again prn either RDS or [place] Essential hypertension D/c acei 08/05/2022  due to cough/dysphagia/choking resolved   Lab Results  Component Value Date   CREATININE 1.04 08/09/2024   CREATININE 0.97 08/08/2024   CREATININE 1.07 04/27/2023     BP  too high on amlodipine   5 mg one half daily so rec >>>  full dose amlodipine  = 5mg  daily and f/u with PCP and cards as planned        Each maintenance medication was reviewed in detail including emphasizing most importantly the difference between maintenance and prns and under what circumstances the  prns are to be triggered using an action plan format where appropriate.  Total time for H and P, chart review, counseling and generating customized AVS unique to this office visit / same day charting = 30 min summary final f/u ov          AVS  Patient Instructions  Increase the amlopidine to 5 mg daily and check back in with your Primary care doctor    If you are satisfied with your treatment plan,  let your doctor know and he/she can either refill your medications or you can return here when your prescription runs out.     If in any way you are not 100% satisfied,  please tell us .  If 100% better, tell your friends!  Pulmonary follow up is as needed     Ozell America, MD 09/04/2024               "

## 2024-09-04 NOTE — Assessment & Plan Note (Addendum)
 D/c acei 08/05/2022  due to cough/dysphagia/choking resolved   Lab Results  Component Value Date   CREATININE 1.04 08/09/2024   CREATININE 0.97 08/08/2024   CREATININE 1.07 04/27/2023     BP  too high on amlodipine   5 mg one half daily so rec >>>  full dose amlodipine  = 5mg  daily and f/u with PCP and cards as planned        Each maintenance medication was reviewed in detail including emphasizing most importantly the difference between maintenance and prns and under what circumstances the prns are to be triggered using an action plan format where appropriate.  Total time for H and P, chart review, counseling and generating customized AVS unique to this office visit / same day charting = 30 min summary final f/u ov

## 2024-09-12 ENCOUNTER — Ambulatory Visit

## 2024-09-12 VITALS — Ht 70.0 in | Wt 247.0 lb

## 2024-09-12 DIAGNOSIS — Z0000 Encounter for general adult medical examination without abnormal findings: Secondary | ICD-10-CM

## 2024-09-12 NOTE — Progress Notes (Signed)
 "  Chief Complaint  Patient presents with   Medicare Wellness     Subjective:   Anthony B Weichert Jr. is a 74 y.o. male who presents for a Medicare Annual Wellness Visit.  Visit info / Clinical Intake: Medicare Wellness Visit Type:: Subsequent Annual Wellness Visit Persons participating in visit and providing information:: patient & caregiver Medicare Wellness Visit Mode:: Telephone If telephone:: video declined Since this visit was completed virtually, some vitals may be partially provided or unavailable. Missing vitals are due to the limitations of the virtual format.: Documented vitals are patient reported If Telephone or Video please confirm:: I connected with patient using audio/video enable telemedicine. I verified patient identity with two identifiers, discussed telehealth limitations, and patient agreed to proceed. Patient Location:: home Provider Location:: office Interpreter Needed?: No Pre-visit prep was completed: yes AWV questionnaire completed by patient prior to visit?: no Living arrangements:: lives with spouse/significant other Patient's Overall Health Status Rating: good Typical amount of pain: some Does pain affect daily life?: (!) yes Are you currently prescribed opioids?: no  Dietary Habits and Nutritional Risks How many meals a day?: 3 Eats fruit and vegetables daily?: yes Most meals are obtained by: preparing own meals; eating out In the last 2 weeks, have you had any of the following?: none Diabetic:: no  Functional Status Activities of Daily Living (to include ambulation/medication): Independent Ambulation: Independent Medication Administration: Independent Home Management (perform basic housework or laundry): Independent Manage your own finances?: yes Primary transportation is: driving Concerns about vision?: (!) yes (glaucoma) Concerns about hearing?: (!) yes Uses hearing aids?: (!) yes  Fall Screening Falls in the past year?: 0 Number of falls in  past year: 0 Was there an injury with Fall?: 0 Fall Risk Category Calculator: 0 Patient Fall Risk Level: Low Fall Risk  Fall Risk Patient at Risk for Falls Due to: Medication side effect Fall risk Follow up: Falls prevention discussed; Education provided; Falls evaluation completed  Home and Transportation Safety: All rugs have non-skid backing?: yes All stairs or steps have railings?: yes Grab bars in the bathtub or shower?: (!) no Have non-skid surface in bathtub or shower?: yes Good home lighting?: yes Regular seat belt use?: yes Hospital stays in the last year:: no  Cognitive Assessment Difficulty concentrating, remembering, or making decisions? : no Will 6CIT or Mini Cog be Completed: yes What year is it?: 0 points What month is it?: 0 points Give patient an address phrase to remember (5 components): [address] [place] About what time is it?: 0 points Count backwards from 20 to 1: 0 points Say the months of the year in reverse: 0 points Repeat the address phrase from earlier: 0 points 6 CIT Score: 0 points  Advance Directives (For Healthcare) Does Patient Have a Medical Advance Directive?: Yes Type of Advance Directive: Out of facility DNR (pink MOST or yellow form) Out of facility DNR (pink MOST or yellow form) in Chart? (Ambulatory ONLY): Yes - validated most recent copy scanned in chart  Reviewed/Updated  Reviewed/Updated: Reviewed All (Medical, Surgical, Family, Medications, Allergies, Care Teams, Patient Goals)    Allergies (verified) Red yeast rice [cholestin], Statins, Codeine, and Latex   Current Medications (verified) Outpatient Encounter Medications as of 09/12/2024  Medication Sig   amLODipine  (NORVASC ) 5 MG tablet Take 0.5 tablets (2.5 mg total) by mouth daily.   aspirin EC 81 MG tablet Take 81 mg by mouth daily. Swallow whole.   benzonatate  (TESSALON ) 100 MG capsule Take 1 capsule (100  mg total) by mouth 3 (three) times daily as needed for cough.    brimonidine  (ALPHAGAN  P) 0.1 % SOLN Place 1 drop into the right eye every 8 (eight) hours.   carvedilol  (COREG ) 6.25 MG tablet Take 1 tablet (6.25 mg total) by mouth 2 (two) times daily.   dorzolamide -timolol  (COSOPT ) 22.3-6.8 MG/ML ophthalmic solution Place 2 drops into the right eye 2 (two) times daily.    ezetimibe  (ZETIA ) 10 MG tablet Take 1 tablet (10 mg total) by mouth daily.   famotidine  (PEPCID ) 40 MG tablet Take 1 tablet (40 mg total) by mouth daily.   finasteride  (PROSCAR ) 5 MG tablet TAKE 1 TABLET BY MOUTH EVERY DAY IN THE MORNING   furosemide  (LASIX ) 20 MG tablet Take 1 tablet (20 mg total) by mouth daily.   Lactobacillus-Inulin (CULTURELLE ADULT ULT BALANCE PO) Take 1 capsule by mouth daily.   nitroGLYCERIN  (NITROSTAT ) 0.4 MG SL tablet Place 1 tablet (0.4 mg total) under the tongue every 5 (five) minutes as needed for chest pain.   Omega-3 Fatty Acids (FISH OIL) 1000 MG CAPS Take 1,000 mg by mouth daily.   pantoprazole  (PROTONIX ) 40 MG tablet Take 1 tablet (40 mg total) by mouth daily.   Polyvinyl Alcohol-Povidone (REFRESH OP) Place 1 drop into the right eye daily as needed (dry eyes/irritation).   promethazine -dextromethorphan (PROMETHAZINE -DM) 6.25-15 MG/5ML syrup Take 5 mLs by mouth at bedtime as needed for cough.   spironolactone  (ALDACTONE ) 25 MG tablet TAKE 1 TABLET (25 MG TOTAL) BY MOUTH DAILY.   guaiFENesin  (MUCINEX ) 600 MG 12 hr tablet Take 2 tablets (1,200 mg total) by mouth 2 (two) times daily. (Patient not taking: Reported on 09/12/2024)   metoprolol  tartrate (LOPRESSOR ) 100 MG tablet Take 1 tablet (100 mg total) by mouth once. 2 HOURS PRIOR TO PROCEDURE (Patient not taking: Reported on 09/12/2024)   olmesartan  (BENICAR ) 40 MG tablet TAKE 1 TABLET BY MOUTH EVERY DAY (Patient not taking: Reported on 09/12/2024)   No facility-administered encounter medications on file as of 09/12/2024.    History: Past Medical History:  Diagnosis Date   Benign neoplasm of colon     Benign neoplasm of skin of upper limb, including shoulder    Cancer of skin of external cheek    Carpal tunnel syndrome    Cataract 1997   Chest pain    a. normal cors by cath in 2015   Chronic diastolic HF (heart failure) (HCC)    Diaphragmatic hernia without mention of obstruction or gangrene    Dyslipidemia    Elevated prostate specific antigen (PSA)    GERD (gastroesophageal reflux disease) 2016   Glaucoma 2000   Herpes zoster without mention of complication    History of cancer of external ear    History of hiatal hernia    Hypertension 2013   Hypertrophy of prostate with urinary obstruction and other lower urinary tract symptoms (LUTS)    Impotence of organic origin    Lumbago    Obesity, unspecified    Osteoarthrosis, unspecified whether generalized or localized, lower leg    Other and unspecified hyperlipidemia    Other seborrheic keratosis    Pneumonia 2004   Stenosis, cervical spine    Ulcer 1995   Unspecified tinnitus    Past Surgical History:  Procedure Laterality Date   ANTERIOR CERVICAL DECOMP/DISCECTOMY FUSION N/A 02/19/2018   Procedure: ANTERIOR CERVICAL DECOMPRESSION/DISCECTOMY FUSION - CERVICAL FOUR-CERVICAL FIVE - CERVICAL FIVE-CERVICAL SIX;  Surgeon: Louis Shove, MD;  Location: [facility] OR;  Service: Neurosurgery;  Laterality: N/A;   CARDIOVASCULAR STRESS TEST  06/15/2012   Non-diagnostic for ischemia. No lexiscan  EKG changes.   CHOLECYSTECTOMY  1992   COLONOSCOPY  2022   EYE SURGERY  2000   eyeband  08/22/2004   removal of right eyeband   HAND SURGERY  07/2022   right pointer knuckle replaced   INGUINAL HERNIA REPAIR  08/22/1982   left   INTRAOCULAR LENS REMOVAL  08/22/2006   right   JOINT REPLACEMENT  2011   LEFT HEART CATHETERIZATION WITH CORONARY ANGIOGRAM N/A 09/05/2013   Procedure: LEFT HEART CATHETERIZATION WITH CORONARY ANGIOGRAM;  Surgeon: Debby DELENA Sor, MD;  Location: [facility] CATH LAB;  Service: Cardiovascular;  Laterality: N/A;   LOWER VENOUS  EXTREMITY DOPPLER  01/26/2011   No evidence of thrombus or thrombophlebitis.   RETINAL DETACHMENT SURGERY  08/23/1999   right   TOTAL HIP ARTHROPLASTY  08/22/2010   bilateral   TOTAL KNEE ARTHROPLASTY  2011, 2012   bilateral   TRANSTHORACIC ECHOCARDIOGRAM  10/05/2009   EF >55%, normal LV size, systolic, and diastolic function.   VENTRAL HERNIA REPAIR N/A 04/28/2023   Procedure: LAPAROSCOPIC VENTRAL HERNIA REPAIR WITH MESH;  Surgeon: Curvin Deward MOULD, MD;  Location: [facility] OR;  Service: General;  Laterality: N/A;   Family History  Problem Relation Age of Onset   Diabetes Mother    Arrhythmia Mother    Hypertension Mother    Dementia Father    Pulmonary embolism Father    Hypertension Father    Cancer Sister        Liver cancer - Histocytic lymphoma   Cancer Sister 58       Skin cancer   Hypertension Sister 53   Diabetes Sister    Stroke Brother    Diabetes Brother    Hypertension Brother    Cancer Daughter    Cancer Maternal Grandmother        Skin cancer   Heart attack Maternal Grandfather    Cancer Paternal Grandfather    Hypertension Paternal Grandfather    Social History   Occupational History   Occupation: Cabin crew --retired    Associate Professor: [employer]    Comment: And Dispensing Optician  Tobacco Use   Smoking status: Never    Passive exposure: Past   Smokeless tobacco: Never  Vaping Use   Vaping status: Never Used  Substance and Sexual Activity   Alcohol use: No   Drug use: No   Sexual activity: Yes    Partners: Female    Comment: wife   Tobacco Counseling Counseling given: Not Answered  SDOH Screenings   Food Insecurity: No Food Insecurity (09/12/2024)  Housing: Unknown (09/12/2024)  Transportation Needs: No Transportation Needs (09/12/2024)  Utilities: Not At Risk (09/12/2024)  Alcohol Screen: Low Risk (09/12/2024)  Depression (PHQ2-9): Low Risk (09/12/2024)  Financial Resource Strain: Low Risk (09/12/2024)  Physical Activity: Inactive (09/12/2024)  Social  Connections: Socially Integrated (09/12/2024)  Stress: No Stress Concern Present (09/12/2024)  Tobacco Use: Low Risk (09/12/2024)  Recent Concern: Tobacco Use - Medium Risk (07/15/2024)   Received from [facility]  Health Literacy: Adequate Health Literacy (09/12/2024)   See flowsheets for full screening details  Depression Screen PHQ 2 & 9 Depression Scale- Over the past 2 weeks, how often have you been bothered by any of the following problems? Little interest or pleasure in doing things: 0 Feeling down, depressed, or hopeless (PHQ Adolescent also includes...irritable): 0 PHQ-2 Total Score: 0 Trouble falling or staying asleep, or sleeping  too much: 0 Feeling tired or having little energy: 3 Poor appetite or overeating (PHQ Adolescent also includes...weight loss): 0 Feeling bad about yourself - or that you are a failure or have let yourself or your family down: 0 Trouble concentrating on things, such as reading the newspaper or watching television (PHQ Adolescent also includes...like school work): 0 Moving or speaking so slowly that other people could have noticed. Or the opposite - being so fidgety or restless that you have been moving around a lot more than usual: 0 Thoughts that you would be better off dead, or of hurting yourself in some way: 0 PHQ-9 Total Score: 3 If you checked off any problems, how difficult have these problems made it for you to do your work, take care of things at home, or get along with other people?: Not difficult at all  Depression Treatment Depression Interventions/Treatment : EYV7-0 Score <4 Follow-up Not Indicated     Goals Addressed               This Visit's Progress     wants to lose weight (pt-stated)               Objective:    Today's Vitals   09/12/24 1420  Weight: 247 lb (112 kg)  Height: 5' 10 (1.778 m)   Body mass index is 35.44 kg/m.  Hearing/Vision screen Vision Screening - Comments:: Regular eye  exams Immunizations and Health Maintenance Health Maintenance  Topic Date Due   Zoster Vaccines- Shingrix (1 of 2) Never done   Influenza Vaccine  03/22/2024   Medicare Annual Wellness (AWV)  09/12/2025   Colonoscopy  03/09/2031   DTaP/Tdap/Td (3 - Td or Tdap) 09/02/2031   Pneumococcal Vaccine: 50+ Years  Completed   Meningococcal B Vaccine  Aged Out   COVID-19 Vaccine  Discontinued   Hepatitis C Screening  Discontinued        Assessment/Plan:  This is a routine wellness examination for [place].  Patient Care Team: Jimmy Charlie FERNS, MD as PCP - General (Internal Medicine) Kate Lonni CROME, MD as PCP - Cardiology (Cardiology) Darlean Ozell NOVAK, MD as Consulting Physician (Pulmonary Disease) Lanna Glean Agent, MD as Referring Physician (Ophthalmology) Liam Lerner, MD as Consulting Physician (Orthopedic Surgery) Louis Shove, MD as Consulting Physician (Neurosurgery)  I have personally reviewed and noted the following in the patients chart:   Medical and social history Use of alcohol, tobacco or illicit drugs  Current medications and supplements including opioid prescriptions. Functional ability and status Nutritional status Physical activity Advanced directives List of other physicians Hospitalizations, surgeries, and ER visits in previous 12 months Vitals Screenings to include cognitive, depression, and falls Referrals and appointments  No orders of the defined types were placed in this encounter.  In addition, I have reviewed and discussed with patient certain preventive protocols, quality metrics, and best practice recommendations. A written personalized care plan for preventive services as well as general preventive health recommendations were provided to patient.   Ardella FORBES Dawn, LPN   8/77/7973   Return in 1 year (on 09/12/2025).  After Visit Summary: (MyChart) Due to this being a telephonic visit, the after visit summary with patients personalized  plan was offered to patient via MyChart   Nurse Notes: HM Addressed: Vaccines Due: flu and shingles  "

## 2024-09-12 NOTE — Patient Instructions (Signed)
 Mr. Beyl,  Thank you for taking the time for your Medicare Wellness Visit. I appreciate your continued commitment to your health goals. Please review the care plan we discussed, and feel free to reach out if I can assist you further.  Please note that Annual Wellness Visits do not include a physical exam. Some assessments may be limited, especially if the visit was conducted virtually. If needed, we may recommend an in-person follow-up with your provider.  Ongoing Care Seeing your primary care provider every 3 to 6 months helps us  monitor your health and provide consistent, personalized care.   Referrals If a referral was made during today's visit and you haven't received any updates within two weeks, please contact the referred provider directly to check on the status.  Recommended Screenings:  Health Maintenance  Topic Date Due   Zoster (Shingles) Vaccine (1 of 2) Never done   Flu Shot  03/22/2024   Medicare Annual Wellness Visit  09/13/2024   Colon Cancer Screening  03/09/2031   DTaP/Tdap/Td vaccine (3 - Td or Tdap) 09/02/2031   Pneumococcal Vaccine for age over 42  Completed   Meningitis B Vaccine  Aged Out   COVID-19 Vaccine  Discontinued   Hepatitis C Screening  Discontinued       09/12/2024    2:27 PM  Advanced Directives  Does Patient Have a Medical Advance Directive? Yes  Type of Advance Directive Out of facility DNR (pink MOST or yellow form)    Vision: Annual vision screenings are recommended for early detection of glaucoma, cataracts, and diabetic retinopathy. These exams can also reveal signs of chronic conditions such as diabetes and high blood pressure.  Dental: Annual dental screenings help detect early signs of oral cancer, gum disease, and other conditions linked to overall health, including heart disease and diabetes.  Please see the attached documents for additional preventive care recommendations.

## 2024-09-16 ENCOUNTER — Encounter

## 2024-09-19 ENCOUNTER — Other Ambulatory Visit: Payer: Self-pay | Admitting: Orthopedic Surgery

## 2024-09-19 DIAGNOSIS — M545 Low back pain, unspecified: Secondary | ICD-10-CM

## 2024-09-23 ENCOUNTER — Telehealth: Admitting: Physician Assistant

## 2024-09-23 ENCOUNTER — Telehealth: Payer: Self-pay | Admitting: Internal Medicine

## 2024-09-23 DIAGNOSIS — J069 Acute upper respiratory infection, unspecified: Secondary | ICD-10-CM

## 2024-09-23 MED ORDER — AZELASTINE HCL 0.1 % NA SOLN: 2.0000 | Freq: Two times a day (BID) | NASAL | 0 refills | Status: AC

## 2024-09-23 MED ORDER — BENZONATATE 100 MG PO CAPS: 100.0000 mg | ORAL_CAPSULE | Freq: Two times a day (BID) | ORAL | 0 refills | Status: AC | PRN

## 2024-09-23 NOTE — Telephone Encounter (Signed)
 Copied from CRM #[id number]. Topic: Clinical - Prescription Issue >> Sep 23, 2024  9:07 AM Darshell M wrote: Reason for CRM: Patient dx and treated for upper respiratory infection with a cough in December. Patient was treated with amoxicillin  at that time. Patient showing signs of the infection again and spouse requesting an antibiotic be called in. Meade Hamilton [phone].

## 2024-09-23 NOTE — Progress Notes (Signed)
 We are sorry you are not feeling well.  Here is how we plan to help!  Based on what you have shared with me, it looks like you may have a viral upper respiratory infection.  Upper respiratory infections are caused by a large number of viruses; however, rhinovirus is the most common cause. COVID is also a possibility and I recommend you test yourself for COVID at home.    Symptoms vary from person to person, with common symptoms including sore throat, cough, and fatigue or lack of energy, and a feeling of general discomfort.  A low-grade fever of up to 100.4 may present, but is often uncommon.  Symptoms vary however, and are closely related to a person's age or underlying illnesses.  The most common symptoms associated with an upper respiratory infection are nasal discharge or congestion, cough, sneezing, headache and pressure in the ears and face.  These symptoms usually persist for about 3 to 10 days, but can last up to 2 weeks.  It is important to know that upper respiratory infections do not cause serious illness or complications in most cases.    Upper respiratory infections can be transmitted from person to person, with the most common method of transmission being a person's hands.  The virus is able to live on the skin and can infect other persons for up to 2 hours after direct contact.  Also, these can be transmitted when someone coughs or sneezes; thus, it is important to cover the mouth to reduce this risk.  To keep the spread of the illness at bay, good hand hygiene is very important!  Because this is a viral infection, there are no specific treatments other than to help you with the symptoms until the infection runs its course.  Antibiotics are not recommended. If you feel you are sicker than I am understanding through your Evisit questionnaire answers, please get checked in person, either by your primary care provider, your pulmonologist, or at an urgent care.   For nasal congestion, you may  use plain Mucinex .   Saline nasal spray or nasal drops can help and can safely be used as often as needed for congestion.  Try using saline irrigation, such as with a neti pot, several times a day while you are sick. Many neti pots come with salt packets premeasured to use to make saline. If you use your own salt, make sure it is kosher salt or sea salt (don't use table salt as it has iodine in it and you don't need that in your nose). Use distilled water to make saline. If you mix your own saline using your own salt, the recipe is 1/4 teaspoon salt in 1 cup warm water. Using saline irrigation can help prevent and treat sinus infections.   For your congestion, I have prescribed Azelastine  nasal spray two sprays in each nostril twice a day  If you do not have a history of heart disease, hypertension, diabetes or thyroid  disease, prostate/bladder issues or glaucoma, you may also use Sudafed to treat nasal congestion.  It is highly recommended that you consult with a pharmacist or your primary care physician to ensure this medication is safe for you to take.     If you have a cough, you may use over-the-counter cough suppressants such as Delsym and Robitussin.  If you have glaucoma or high blood pressure, you can also use Coricidin HBP.   For cough I have prescribed for you A prescription cough medication called Tessalon  Perles  100 mg. You may take 1-2 capsules every 8 hours as needed for cough  If you have a sore or scratchy throat, use a saltwater gargle-  to  teaspoon of salt dissolved in a 4-ounce to 8-ounce glass of warm water.  Gargle the solution for approximately 15-30 seconds and then spit.  It is important not to swallow the solution.  You can also use throat lozenges/cough drops and Chloraseptic spray to help with throat pain or discomfort.  Warm or cold liquids can also be helpful in relieving throat pain.  For headache, pain, or general discomfort, you can use Ibuprofen or Tylenol  as directed.    Some authorities believe that zinc sprays or the use of Echinacea may shorten the course of your symptoms.   HOME CARE Only take medications as instructed by your medical team. Be sure to drink plenty of fluids. Water is fine as well as fruit juices, sodas and electrolyte beverages. You may want to stay away from caffeine or alcohol. If you are nauseated, try taking small sips of liquids. How do you know if you are getting enough fluid? Your urine should be a pale yellow or almost colorless. Get rest. Taking a steamy shower or using a humidifier may help nasal congestion and ease sore throat pain. You can place a towel over your head and breathe in the steam from hot water coming from a faucet. Using a saline nasal spray works much the same way. Cough drops, hard candies and sore throat lozenges may ease your cough. Avoid close contacts especially the very young and the elderly Cover your mouth if you cough or sneeze Always remember to wash your hands.   GET HELP RIGHT AWAY IF: You develop worsening fever. If your symptoms do not improve within 10 days You develop yellow or green discharge from your nose over 3 days. You have coughing fits You develop a severe head ache or visual changes. You develop shortness of breath, difficulty breathing or start having chest pain Your symptoms persist after you have completed your treatment plan  MAKE SURE YOU  Understand these instructions. Will watch your condition. Will get help right away if you are not doing well or get worse.  Your e-visit answers were reviewed by a board certified advanced clinical practitioner to complete your personal care plan. Depending upon the condition, your plan could have included both over-the-counter or prescription medications.  Please review your pharmacy choice. If there is a problem, you may call our nursing hot line at and have the prescription routed to another pharmacy. Your safety is important to us .  If you have drug allergies, check your prescription carefully.   You can use MyChart to ask questions about todays visit, request a non-urgent call back, or ask for a work or school excuse for 24 hours related to this e-Visit. If it has been greater than 24 hours you will need to follow up with your provider, or enter a new e-Visit to address those concerns. You will get an e-mail in the next two days asking about your experience.  I hope that your e-visit has been valuable and will speed your recovery. Thank you for using e-visits.   I have spent 5 minutes in review of e-visit questionnaire, review and updating patient chart, medical decision making and response to patient.   Jon Belt, PhD, FNP-BC

## 2024-09-23 NOTE — Telephone Encounter (Signed)
 Called and spoke to pt's wife. Advised her that he would need to be seen because his illness was 08-08-24. There are multiple illnesses going around. SHe has not tested him for Covid or Flu at home. She will get a test on her way home from work and look at getting him a virtual visit on [employer] or at our office.

## 2024-10-03 ENCOUNTER — Other Ambulatory Visit

## 2024-10-24 ENCOUNTER — Ambulatory Visit: Admitting: Internal Medicine

## 2024-11-12 ENCOUNTER — Encounter

## 2025-02-05 ENCOUNTER — Ambulatory Visit: Admitting: Cardiology

## 2025-09-16 ENCOUNTER — Ambulatory Visit
